# Patient Record
Sex: Male | Born: 1935
Health system: Southern US, Community
[De-identification: ages and names within clinical notes are randomized; demographics above are authoritative.]

## PROBLEM LIST (undated history)

## (undated) DIAGNOSIS — N320 Bladder-neck obstruction: Secondary | ICD-10-CM

## (undated) DIAGNOSIS — K5730 Diverticulosis of large intestine without perforation or abscess without bleeding: Secondary | ICD-10-CM

## (undated) DIAGNOSIS — N529 Male erectile dysfunction, unspecified: Secondary | ICD-10-CM

## (undated) DIAGNOSIS — E785 Hyperlipidemia, unspecified: Secondary | ICD-10-CM

## (undated) DIAGNOSIS — I2510 Atherosclerotic heart disease of native coronary artery without angina pectoris: Secondary | ICD-10-CM

## (undated) DIAGNOSIS — I714 Abdominal aortic aneurysm, without rupture, unspecified: Secondary | ICD-10-CM

## (undated) DIAGNOSIS — M5430 Sciatica, unspecified side: Secondary | ICD-10-CM

## (undated) DIAGNOSIS — N393 Stress incontinence (female) (male): Secondary | ICD-10-CM

## (undated) DIAGNOSIS — M1990 Unspecified osteoarthritis, unspecified site: Secondary | ICD-10-CM

## (undated) DIAGNOSIS — Z8719 Personal history of other diseases of the digestive system: Secondary | ICD-10-CM

## (undated) DIAGNOSIS — I10 Essential (primary) hypertension: Secondary | ICD-10-CM

## (undated) DIAGNOSIS — C679 Malignant neoplasm of bladder, unspecified: Secondary | ICD-10-CM

## (undated) DIAGNOSIS — Z923 Personal history of irradiation: Secondary | ICD-10-CM

## (undated) DIAGNOSIS — Z860100 Personal history of colon polyps, unspecified: Secondary | ICD-10-CM

## (undated) DIAGNOSIS — I739 Peripheral vascular disease, unspecified: Secondary | ICD-10-CM

## (undated) DIAGNOSIS — Z9189 Other specified personal risk factors, not elsewhere classified: Secondary | ICD-10-CM

## (undated) DIAGNOSIS — C61 Malignant neoplasm of prostate: Secondary | ICD-10-CM

## (undated) DIAGNOSIS — H8110 Benign paroxysmal vertigo, unspecified ear: Secondary | ICD-10-CM

## (undated) DIAGNOSIS — N35919 Unspecified urethral stricture, male, unspecified site: Secondary | ICD-10-CM

## (undated) DIAGNOSIS — K219 Gastro-esophageal reflux disease without esophagitis: Secondary | ICD-10-CM

## (undated) DIAGNOSIS — Z973 Presence of spectacles and contact lenses: Secondary | ICD-10-CM

## (undated) DIAGNOSIS — Z951 Presence of aortocoronary bypass graft: Secondary | ICD-10-CM

## (undated) DIAGNOSIS — Z9889 Other specified postprocedural states: Secondary | ICD-10-CM

## (undated) HISTORY — DX: Abdominal aortic aneurysm, without rupture: I71.4

## (undated) HISTORY — PX: CARDIAC CATHETERIZATION: SHX172

## (undated) HISTORY — DX: Abdominal aortic aneurysm, without rupture, unspecified: I71.40

## (undated) HISTORY — DX: Personal history of irradiation: Z92.3

## (undated) HISTORY — PX: KNEE ARTHROSCOPY: SUR90

## (undated) HISTORY — PX: CARDIOVASCULAR STRESS TEST: SHX262

## (undated) HISTORY — DX: Atherosclerotic heart disease of native coronary artery without angina pectoris: I25.10

## (undated) HISTORY — PX: LUMBAR DISC SURGERY: SHX700

---

## 1979-08-03 HISTORY — PX: LUMBAR DISC SURGERY: SHX700

## 1989-12-02 HISTORY — PX: CORONARY ANGIOPLASTY: SHX604

## 1992-12-02 HISTORY — PX: INGUINAL HERNIA REPAIR: SUR1180

## 2001-04-01 ENCOUNTER — Ambulatory Visit (HOSPITAL_COMMUNITY): Admission: RE | Admit: 2001-04-01 | Discharge: 2001-04-01 | Payer: Self-pay | Admitting: Gastroenterology

## 2002-11-08 ENCOUNTER — Ambulatory Visit (HOSPITAL_COMMUNITY): Admission: RE | Admit: 2002-11-08 | Discharge: 2002-11-08 | Payer: Self-pay | Admitting: Gastroenterology

## 2002-11-08 ENCOUNTER — Encounter (INDEPENDENT_AMBULATORY_CARE_PROVIDER_SITE_OTHER): Payer: Self-pay | Admitting: *Deleted

## 2004-12-03 DIAGNOSIS — C61 Malignant neoplasm of prostate: Secondary | ICD-10-CM | POA: Insufficient documentation

## 2005-09-26 ENCOUNTER — Ambulatory Visit: Admission: RE | Admit: 2005-09-26 | Discharge: 2005-11-26 | Payer: Self-pay | Admitting: Radiation Oncology

## 2005-11-18 ENCOUNTER — Encounter (INDEPENDENT_AMBULATORY_CARE_PROVIDER_SITE_OTHER): Payer: Self-pay | Admitting: Specialist

## 2005-11-18 ENCOUNTER — Inpatient Hospital Stay (HOSPITAL_COMMUNITY): Admission: RE | Admit: 2005-11-18 | Discharge: 2005-11-21 | Payer: Self-pay | Admitting: Urology

## 2005-11-18 HISTORY — PX: OTHER SURGICAL HISTORY: SHX169

## 2007-03-03 ENCOUNTER — Encounter: Payer: Self-pay | Admitting: Family Medicine

## 2009-10-23 ENCOUNTER — Encounter: Payer: Self-pay | Admitting: Cardiology

## 2010-04-06 ENCOUNTER — Ambulatory Visit: Payer: Self-pay | Admitting: Family Medicine

## 2010-04-06 DIAGNOSIS — L989 Disorder of the skin and subcutaneous tissue, unspecified: Secondary | ICD-10-CM | POA: Insufficient documentation

## 2010-04-06 DIAGNOSIS — K219 Gastro-esophageal reflux disease without esophagitis: Secondary | ICD-10-CM | POA: Insufficient documentation

## 2010-04-06 DIAGNOSIS — H8110 Benign paroxysmal vertigo, unspecified ear: Secondary | ICD-10-CM | POA: Insufficient documentation

## 2010-04-06 DIAGNOSIS — I10 Essential (primary) hypertension: Secondary | ICD-10-CM | POA: Insufficient documentation

## 2010-04-06 DIAGNOSIS — E785 Hyperlipidemia, unspecified: Secondary | ICD-10-CM | POA: Insufficient documentation

## 2010-04-06 DIAGNOSIS — M159 Polyosteoarthritis, unspecified: Secondary | ICD-10-CM | POA: Insufficient documentation

## 2010-04-06 DIAGNOSIS — I2510 Atherosclerotic heart disease of native coronary artery without angina pectoris: Secondary | ICD-10-CM | POA: Insufficient documentation

## 2010-04-18 ENCOUNTER — Ambulatory Visit: Payer: Self-pay | Admitting: Family Medicine

## 2010-08-16 ENCOUNTER — Ambulatory Visit: Payer: Self-pay | Admitting: Family Medicine

## 2010-10-31 ENCOUNTER — Ambulatory Visit: Payer: Self-pay | Admitting: Family Medicine

## 2010-10-31 LAB — CONVERTED CEMR LAB
ALT: 25 units/L (ref 0–53)
AST: 21 units/L (ref 0–37)
Albumin: 4.2 g/dL (ref 3.5–5.2)
Alkaline Phosphatase: 60 units/L (ref 39–117)
BUN: 18 mg/dL (ref 6–23)
Basophils Absolute: 0 10*3/uL (ref 0.0–0.1)
Basophils Relative: 0.8 % (ref 0.0–3.0)
Bilirubin Urine: NEGATIVE
Bilirubin, Direct: 0.1 mg/dL (ref 0.0–0.3)
CO2: 28 meq/L (ref 19–32)
Calcium: 9.2 mg/dL (ref 8.4–10.5)
Chloride: 106 meq/L (ref 96–112)
Cholesterol: 155 mg/dL (ref 0–200)
Creatinine, Ser: 1.5 mg/dL (ref 0.4–1.5)
Eosinophils Absolute: 0.4 10*3/uL (ref 0.0–0.7)
Eosinophils Relative: 7.8 % — ABNORMAL HIGH (ref 0.0–5.0)
GFR calc non Af Amer: 47.69 mL/min (ref >60)
Glucose, Bld: 104 mg/dL — ABNORMAL HIGH (ref 70–99)
HCT: 42.5 % (ref 39.0–52.0)
HDL: 38.6 mg/dL — ABNORMAL LOW (ref >39.00)
Hemoglobin: 14.8 g/dL (ref 13.0–17.0)
Ketones, ur: NEGATIVE mg/dL
LDL Cholesterol: 90 mg/dL (ref 0–99)
Leukocytes, UA: NEGATIVE
Lymphocytes Relative: 32.1 % (ref 12.0–46.0)
Lymphs Abs: 1.7 10*3/uL (ref 0.7–4.0)
MCHC: 34.8 g/dL (ref 30.0–36.0)
MCV: 102.5 fL — ABNORMAL HIGH (ref 78.0–100.0)
Monocytes Absolute: 0.6 10*3/uL (ref 0.1–1.0)
Monocytes Relative: 11.7 % (ref 3.0–12.0)
Neutro Abs: 2.6 10*3/uL (ref 1.4–7.7)
Neutrophils Relative %: 47.6 % (ref 43.0–77.0)
Nitrite: NEGATIVE
PSA: 0.07 ng/mL — ABNORMAL LOW (ref 0.10–4.00)
Platelets: 156 10*3/uL (ref 150.0–400.0)
Potassium: 4.8 meq/L (ref 3.5–5.1)
RBC: 4.14 M/uL — ABNORMAL LOW (ref 4.22–5.81)
RDW: 12.9 % (ref 11.5–14.6)
Sodium: 141 meq/L (ref 135–145)
Specific Gravity, Urine: 1.02 (ref 1.000–1.030)
TSH: 1.76 microintl units/mL (ref 0.35–5.50)
Total Bilirubin: 0.6 mg/dL (ref 0.3–1.2)
Total CHOL/HDL Ratio: 4
Total Protein, Urine: NEGATIVE mg/dL
Total Protein: 6.9 g/dL (ref 6.0–8.3)
Triglycerides: 130 mg/dL (ref 0.0–149.0)
Urine Glucose: NEGATIVE mg/dL
Urobilinogen, UA: 0.2 (ref 0.0–1.0)
VLDL: 26 mg/dL (ref 0.0–40.0)
WBC: 5.4 10*3/uL (ref 4.5–10.5)
pH: 5.5 (ref 5.0–8.0)

## 2010-11-08 ENCOUNTER — Ambulatory Visit: Payer: Self-pay | Admitting: Family Medicine

## 2010-11-08 LAB — CONVERTED CEMR LAB
Cholesterol, target level: 200 mg/dL
HDL goal, serum: 40 mg/dL
LDL Goal: 100 mg/dL

## 2010-11-23 ENCOUNTER — Ambulatory Visit: Payer: Self-pay | Admitting: Family Medicine

## 2010-11-23 LAB — CONVERTED CEMR LAB: Rapid Strep: NEGATIVE

## 2010-11-27 ENCOUNTER — Ambulatory Visit
Admission: RE | Admit: 2010-11-27 | Discharge: 2010-11-27 | Payer: Self-pay | Source: Home / Self Care | Attending: Family Medicine | Admitting: Family Medicine

## 2010-11-27 DIAGNOSIS — J019 Acute sinusitis, unspecified: Secondary | ICD-10-CM | POA: Insufficient documentation

## 2010-12-04 ENCOUNTER — Telehealth: Payer: Self-pay | Admitting: Internal Medicine

## 2011-01-01 NOTE — Assessment & Plan Note (Signed)
Summary: skin lesion excision/njr   Vital Signs:  Patient profile:   75 year old male Temp:     97.8 degrees F oral BP sitting:   124 / 72  (left arm) Cuff size:   regular  Vitals Entered By: Sid Falcon LPN (Apr 18, 2010 10:29 AM)   History of Present Illness: Here for skin lesion excision R occiput.  Noted incidentally  on exam. No assoc pain or pruritis.  No personal hx of skin cancer.  Allergies: No Known Drug Allergies  Past History:  Past Medical History: Last updated: 04/06/2010 Arthritis Prostate cancer 2006 Hyperlipidemia Hypertension CAD angioplasty 1991   Cardiology Dr Deborah Chalk GERD PMH reviewed for relevance  Physical Exam  General:  Well-developed,well-nourished,in no acute distress; alert,appropriate and cooperative throughout examination Head:  R occiput 6mm skin lesion-nodular, fleshy color with well demarcated border with some telangiectasias on surface.  No ulceration.   Impression & Recommendations:  Problem # 1:  SKIN LESION (ICD-709.9) Rule out basal cell can..  After reviewing risks and benefits of shave excision, pt wished to proceed.   anesth with 1% xyloc with epi and shave with #15 blade. Minimal bleeding and used drysol.  antibiotic applied.  Specimen to [facility]. Orders: Shave Skin Lesion 0.6-1.0cm scalp/neck/hands/feet/genitalia (16109)  Complete Medication List: 1)  Benicar 40 Mg Tabs (Olmesartan medoxomil) .... Once daily 2)  Lipitor 20 Mg Tabs (Atorvastatin calcium) .... Once daily 3)  Pantoprazole Sodium 40 Mg Tbec (Pantoprazole sodium) .... One tab every other day 4)  Aspirin 81 Mg Tabs (Aspirin) .... Once daily 5)  Stool Softener 100 Mg Caps (Docusate sodium) .... 2 tabs daily 6)  Glucosamine Sulfate 1000 Mg Caps (Glucosamine sulfate) .... As needed  Other Orders: Prescription Created Electronically [phone])  Patient Instructions: 1)  Keep wound dry for 24 hours then clean with soap and water. 2)  Continue topical  antibiotic daily for 3-4 days. 3)  Follow up promptly if you notice any redness or drainage from wound site. Prescriptions: LIPITOR 20 MG TABS (ATORVASTATIN CALCIUM) once daily  #90 x 3   Entered and Authorized by:   Evelena Peat MD   Signed by:   Evelena Peat MD on 04/18/2010   Method used:   Electronically to        [facility]* (mail-order)             ,          Ph: [phone]       Fax: [phone]   RxID:   [id number]

## 2011-01-01 NOTE — Assessment & Plan Note (Signed)
Summary: flu shot//ccm  Nurse Visit   Review of Systems       Flu Vaccine Consent Questions     Do you have a history of severe allergic reactions to this vaccine? no    Any prior history of allergic reactions to egg and/or gelatin? no    Do you have a sensitivity to the preservative Thimersol? no    Do you have a past history of Guillan-Barre Syndrome? no    Do you currently have an acute febrile illness? no    Have you ever had a severe reaction to latex? no    Vaccine information given and explained to patient? yes    Are you currently pregnant? no    Lot Number:[id number]   Exp Date:06/01/2011   Site Given  Left Deltoid IM Josph Macho RMA  August 16, 2010 3:23 PM    Allergies: No Known Drug Allergies  Orders Added: 1)  Flu Vaccine 69yrs + MEDICARE PATIENTS [Q2039] 2)  Administration Flu vaccine - MCR [G0008]

## 2011-01-01 NOTE — Assessment & Plan Note (Signed)
Summary: NEW PT FROM [facility]   Vital Signs:  Patient profile:   75 year old male Height:      66.25 inches Weight:      191 pounds BMI:     30.71 Temp:     98.2 degrees F oral Pulse rate:   72 / minute Pulse rhythm:   regular Resp:     12 per minute BP sitting:   140 / 80  (left arm) Cuff size:   regular  Vitals Entered By: Sid Falcon LPN (Apr 06, 1609 10:43 AM)  Nutrition Counseling: Patient's BMI is greater than 25 and therefore counseled on weight management options. CC: New to establish   History of Present Illness: New patient to establish care.  Past medical history significant for osteoarthritis involving multiple joints, remote history of prostate cancer, GERD, hypertension, hyperlipidemia. He continues to see cardiologist and urologist regularly.  CAD stable. History of reported angioplasty 1991. No other interventional studies. Reported nuclear str test and lipid panel last December normal.  Arthritis mostly involving thumbs, right hip, and neck. Has had some recent progressive lower cervical neck pain. No injury. No radiculopathy symptoms. Denies any numbness or weakness. He usually does not take any medications. Pain exacerbated by neck movement.  Hypertension treated Benicar 40 mg daily. Denies any recent orthostatic symptoms. No recent chest pains or palpitations.  Lipids treated with Lipitor 20 mg daily. No myalgias.  Reflux stable on pantoprazole which he takes every other day. No recent dysphagia.  Acute problem of dizziness early morning which is vertigo by description usually goes away after several minutes. No hearing changes. No focal weakness. No symptoms of ataxia.  Patient relates a couple skin lesions on face and one right occipital area which he just noticed in recent months.   These are asymptomatic.  Family history reviewed as recorded elsewhere. Social history is that he quit smoking 1990. Usually one alcoholic beverage per day. He is  married. Retired.  Preventive Screening-Counseling & Management  Alcohol-Tobacco     Smoking Status: quit     Year Quit: 1990  Caffeine-Diet-Exercise     Does Patient Exercise: yes  Allergies (verified): No Known Drug Allergies  Past History:  Family History: Last updated: 04/06/2010 Family History of Arthritis Family History of Prostate CA, father Elevated cholesterol, parent Hypertension, parent, grandparent sudden death  Social History: Last updated: 04/06/2010 Retired Married Alcohol use-yes Regular exercise-yes past smoker, quit 1990  Risk Factors: Exercise: yes (04/06/2010)  Risk Factors: Smoking Status: quit (04/06/2010)  Past Medical History: Arthritis Prostate cancer 2006 Hyperlipidemia Hypertension CAD angioplasty 1991   Cardiology Dr Deborah Chalk GERD  Past Surgical History: Angioplasty 1991 Esophagas ?dilatation, 1992 Hernia, 1994 Endoscopy 2002 Radical Prostatectomy 2006  Family History: Family History of Arthritis Family History of Prostate CA, father Elevated cholesterol, parent Hypertension, parent, grandparent sudden death  Social History: Retired Married Alcohol use-yes Regular exercise-yes past smoker, quit 1990 Smoking Status:  quit Does Patient Exercise:  yes  Review of Systems  The patient denies anorexia, fever, weight loss, weight gain, decreased hearing, chest pain, syncope, dyspnea on exertion, peripheral edema, prolonged cough, headaches, hemoptysis, abdominal pain, melena, hematochezia, severe indigestion/heartburn, hematuria, muscle weakness, depression, and enlarged lymph nodes.         freq R hip pains.  Physical Exam  General:  Well-developed,well-nourished,in no acute distress; alert,appropriate and cooperative throughout examination Head:  Normocephalic and atraumatic without obvious abnormalities. No apparent alopecia or balding. Eyes:  pupils equal, pupils round, and pupils  reactive to light.   Ears:   External ear exam shows no significant lesions or deformities.  Otoscopic examination reveals clear canals, tympanic membranes are intact bilaterally without bulging, retraction, inflammation or discharge. Hearing is grossly normal bilaterally. Mouth:  Oral mucosa and oropharynx without lesions or exudates.  Teeth in good repair. Neck:  No deformities, masses, or tenderness noted. Lungs:  Normal respiratory effort, chest expands symmetrically. Lungs are clear to auscultation, no crackles or wheezes. Heart:  normal rate, regular rhythm, and no gallop.   Abdomen:  soft, non-tender, no distention, no masses, no hepatomegaly, and no splenomegaly.   Extremities:  no peripheral edema or clubbing Neurologic:  alert & oriented X3, cranial nerves II-XII intact, strength normal in all extremities, and gait normal.   Skin:  patient has a couple seborrheic keratoses one left side of face and one right side of face. These reveal clear margin and homogenous in color. Right occipital scalp slightly elevated nodular lesion about 5 mm diameter with slight central umbilication and some telangiectasias on the surface consistent with possible basal cell carcinoma. Psych:  normally interactive, good eye contact, not anxious appearing, and not depressed appearing.     Impression & Recommendations:  Problem # 1:  GERD (ICD-530.81)  His updated medication list for this problem includes:    Pantoprazole Sodium 40 Mg Tbec (Pantoprazole sodium) ..... One tab every other day  Problem # 2:  HYPERTENSION (ICD-401.9)  His updated medication list for this problem includes:    Benicar 40 Mg Tabs (Olmesartan medoxomil) ..... Once daily  Problem # 3:  HYPERLIPIDEMIA (ICD-272.4) monitored by cardiology. His updated medication list for this problem includes:    Lipitor 20 Mg Tabs (Atorvastatin calcium) ..... Once daily  Problem # 4:  SKIN LESION (ICD-709.9) R/O basal cell ca R Occiput.  Pt scheduled for shave  bx.  Problem # 5:  BENIGN POSITIONAL VERTIGO (ICD-386.11) Symptoms improving.  Problem # 6:  OSTEOARTHRITIS, GENERALIZED (ICD-715.00)  His updated medication list for this problem includes:    Aspirin 81 Mg Tabs (Aspirin) ..... Once daily  Problem # 7:  CAD (ICD-414.00) symptoms stable. cont close f/u with cardiology. His updated medication list for this problem includes:    Benicar 40 Mg Tabs (Olmesartan medoxomil) ..... Once daily    Aspirin 81 Mg Tabs (Aspirin) ..... Once daily  Complete Medication List: 1)  Benicar 40 Mg Tabs (Olmesartan medoxomil) .... Once daily 2)  Lipitor 20 Mg Tabs (Atorvastatin calcium) .... Once daily 3)  Pantoprazole Sodium 40 Mg Tbec (Pantoprazole sodium) .... One tab every other day 4)  Aspirin 81 Mg Tabs (Aspirin) .... Once daily 5)  Stool Softener 100 Mg Caps (Docusate sodium) .... 2 tabs daily 6)  Glucosamine Sulfate 1000 Mg Caps (Glucosamine sulfate) .... As needed  Patient Instructions: 1)  Schedule followup appointment for skin lesion excision within the next month  Preventive Care Screening  Colonoscopy:    Date:  12/02/2001    Results:  normal

## 2011-01-01 NOTE — Letter (Signed)
Summary: Records from [facility]  Records from [facility]   Imported By: Maryln Gottron 04/09/2010 10:35:58  _____________________________________________________________________  External Attachment:    Type:   Image     Comment:   External Document

## 2011-01-01 NOTE — Assessment & Plan Note (Signed)
Summary: cpx/cjr   Vital Signs:  Patient profile:   75 year old male Height:      66 inches Weight:      171 pounds Temp:     98.0 degrees F oral Pulse rate:   78 / minute BP sitting:   130 / 80  (left arm) Cuff size:   regular  Vitals Entered By: Romualdo Bolk, CMA (AAMA) (November 08, 2010 1:58 PM) CC: CPX, Hypertension Management, Lipid Management   History of Present Illness: Here for Medicare wellness exam and follow up medical problems.  Here for Medicare AWV:  1.   Risk factors based on Past M, S, F history:  Patient has history of coronary artery disease with angioplasty back in 1991. Other medical problems include history of generalized osteoarthritis, prostate cancer, GERD, hypertension, and hyperlipidemia.  ex-smoker quit 1990. 2.   Physical Activities: states fairly active. Walks some for exercise 3.   Depression/mood: no recent problems with depression or anxiety 4.   Hearing: stable with no gross deficits and no problems with whisper at 15 feet 5.   ADL's: fully independent in all activities of daily living 6.   Fall Risk: low risk. No major orthopedic impairments 7.   Home Safety: no issues identified 8.   Height, weight, &visual acuity:regular eye checkups. Past history of cataract surgery. Appetite and weight stable 9.   Counseling: prior colonoscopy 2003. Exercise more regularly. Needs Tdap 10.   Labs ordered based on risk factors: lipid, hepatic, PSA, BMP, CBC 11.           Referral Coordination  continued yearly followup with urology 12.           Care Plan  Tdap given.  Colonoscopy in 2 years.  Exercise regularly.  Flu vaccine given. 13.            Cognitive Assessment no short-term or long-term memory deficits. No cognitive impairment. Mood and affect are normal  Hx of CAD, hypertension, and hyperlipidemia.  Hx GERD and symptoms well controlled on pantoprazole.  Has been on this for several years. Compliant with medication.  No side  effects. Osteoarthritis with some pain R hip with ambulation.  Some mild early AM stiffness.  No falls. No dysphagia.   Hypertension History:      He denies headache, chest pain, palpitations, dyspnea with exertion, orthopnea, PND, peripheral edema, visual symptoms, neurologic problems, syncope, and side effects from treatment.  He notes no problems with any antihypertensive medication side effects.        Positive major cardiovascular risk factors include male age 42 years old or older, hyperlipidemia, and hypertension.  Negative major cardiovascular risk factors include no history of diabetes and non-tobacco-user status.        Positive history for target organ damage include ASHD (either angina/prior MI/prior CABG).  Further assessment for target organ damage reveals no history of cardiac end-organ damage (CHF/LVH), stroke/TIA, peripheral vascular disease, renal insufficiency, or hypertensive retinopathy.    Lipid Management History:      Positive NCEP/ATP III risk factors include male age 47 years old or older, HDL cholesterol less than 40, hypertension, and ASHD (either angina/prior MI/prior CABG).  Negative NCEP/ATP III risk factors include non-diabetic, non-tobacco-user status, no prior stroke/TIA, no peripheral vascular disease, and no history of aortic aneurysm.        He expresses no side effects from his lipid-lowering medication.  The patient denies any symptoms to suggest myopathy or liver disease.  Preventive Screening-Counseling & Management  Alcohol-Tobacco     Smoking Status: quit     Year Quit: 1990  Caffeine-Diet-Exercise     Does Patient Exercise: yes  Clinical Review Panels:  Prevention   Last Colonoscopy:  normal (12/02/2001)   Last PSA:  0.07 (10/31/2010)  Immunizations   Last Tetanus Booster:  Tdap (11/08/2010)   Last Flu Vaccine:  Fluvax 3+ (08/16/2010)   Last Pneumovax:  given (12/05/2004)  Lipid Management   Cholesterol:  155 (10/31/2010)   LDL  (bad choesterol):  90 (10/31/2010)   HDL (good cholesterol):  38.60 (10/31/2010)  Diabetes Management   Creatinine:  1.5 (10/31/2010)   Last Flu Vaccine:  Fluvax 3+ (08/16/2010)   Last Pneumovax:  given (12/05/2004)  CBC   WBC:  5.4 (10/31/2010)   RBC:  4.14 (10/31/2010)   Hgb:  14.8 (10/31/2010)   Hct:  42.5 (10/31/2010)   Platelets:  156.0 (10/31/2010)   MCV  102.5 (10/31/2010)   MCHC  34.8 (10/31/2010)   RDW  12.9 (10/31/2010)   PMN:  47.6 (10/31/2010)   Lymphs:  32.1 (10/31/2010)   Monos:  11.7 (10/31/2010)   Eosinophils:  7.8 (10/31/2010)   Basophil:  0.8 (10/31/2010)  Complete Metabolic Panel   Glucose:  104 (10/31/2010)   Sodium:  141 (10/31/2010)   Potassium:  4.8 (10/31/2010)   Chloride:  106 (10/31/2010)   CO2:  28 (10/31/2010)   BUN:  18 (10/31/2010)   Creatinine:  1.5 (10/31/2010)   Albumin:  4.2 (10/31/2010)   Total Protein:  6.9 (10/31/2010)   Calcium:  9.2 (10/31/2010)   Total Bili:  0.6 (10/31/2010)   Alk Phos:  60 (10/31/2010)   SGPT (ALT):  25 (10/31/2010)   SGOT (AST):  21 (10/31/2010)   Current Medications (verified): 1)  Benicar 40 Mg Tabs (Olmesartan Medoxomil) .... Once Daily 2)  Lipitor 20 Mg Tabs (Atorvastatin Calcium) .... Once Daily 3)  Pantoprazole Sodium 40 Mg Tbec (Pantoprazole Sodium) .... One Tab Every Other Day 4)  Aspirin 81 Mg Tabs (Aspirin) .... Once Daily 5)  Stool Softener 100 Mg Caps (Docusate Sodium) .... 2 Tabs Daily 6)  Glucosamine Sulfate 1000 Mg Caps (Glucosamine Sulfate) .... As Needed  Allergies (verified): No Known Drug Allergies  Past History:  Past Medical History: Last updated: 04/06/2010 Arthritis Prostate cancer 2006 Hyperlipidemia Hypertension CAD angioplasty 1991   Cardiology Dr Deborah Chalk GERD  Past Surgical History: Last updated: 04/06/2010 Angioplasty 1991 Esophagas ?dilatation, 1992 Hernia, 1994 Endoscopy 2002 Radical Prostatectomy 2006  Family History: Last updated: 04/06/2010 Family  History of Arthritis Family History of Prostate CA, father Elevated cholesterol, parent Hypertension, parent, grandparent sudden death  Social History: Last updated: 04/06/2010 Retired Married Alcohol use-yes Regular exercise-yes past smoker, quit 1990  Risk Factors: Exercise: yes (11/08/2010)  Risk Factors: Smoking Status: quit (11/08/2010)  Family History: Reviewed history from 04/06/2010 and no changes required. Family History of Arthritis Family History of Prostate CA, father Elevated cholesterol, parent Hypertension, parent, grandparent sudden death  Review of Systems  The patient denies anorexia, fever, weight loss, weight gain, vision loss, decreased hearing, hoarseness, chest pain, syncope, dyspnea on exertion, peripheral edema, prolonged cough, headaches, hemoptysis, abdominal pain, melena, hematochezia, severe indigestion/heartburn, hematuria, incontinence, genital sores, muscle weakness, suspicious skin lesions, transient blindness, difficulty walking, depression, unusual weight change, abnormal bleeding, enlarged lymph nodes, and testicular masses.    Physical Exam  General:  Well-developed,well-nourished,in no acute distress; alert,appropriate and cooperative throughout examination Eyes:  pupils equal, pupils round, and pupils reactive to  light.   Ears:  External ear exam shows no significant lesions or deformities.  Otoscopic examination reveals clear canals, tympanic membranes are intact bilaterally without bulging, retraction, inflammation or discharge. Hearing is grossly normal bilaterally. Mouth:  Oral mucosa and oropharynx without lesions or exudates.  Teeth in good repair. Neck:  No deformities, masses, or tenderness noted. Lungs:  Normal respiratory effort, chest expands symmetrically. Lungs are clear to auscultation, no crackles or wheezes. Heart:  Normal rate and regular rhythm. S1 and S2 normal without gallop, murmur, click, rub or other extra  sounds. Abdomen:  Bowel sounds positive,abdomen soft and non-tender without masses, organomegaly or hernias noted. Rectal:  recent per urology Msk:  No deformity or scoliosis noted of thoracic or lumbar spine.   Extremities:  No clubbing, cyanosis, edema, or deformity noted with normal full range of motion of all joints.   Neurologic:  alert & oriented X3, cranial nerves II-XII intact, strength normal in all extremities, and gait normal.   Skin:  no rashes and no suspicious lesions.   Cervical Nodes:  No lymphadenopathy noted Psych:  normally interactive, good eye contact, not anxious appearing, and not depressed appearing.     Impression & Recommendations:  Problem # 1:  Preventive Health Care (ICD-V70.0) Tdap given.  other immunizations up to date.  Problem # 2:  HYPERTENSION (ICD-401.9)  His updated medication list for this problem includes:    Benicar 40 Mg Tabs (Olmesartan medoxomil) ..... Once daily  Problem # 3:  GERD (ICD-530.81)  His updated medication list for this problem includes:    Pantoprazole Sodium 40 Mg Tbec (Pantoprazole sodium) ..... One tab every other day  Problem # 4:  OSTEOARTHRITIS, GENERALIZED (ICD-715.00)  His updated medication list for this problem includes:    Aspirin 81 Mg Tabs (Aspirin) ..... Once daily  Problem # 5:  HYPERLIPIDEMIA (ICD-272.4)  His updated medication list for this problem includes:    Lipitor 20 Mg Tabs (Atorvastatin calcium) ..... Once daily  Complete Medication List: 1)  Benicar 40 Mg Tabs (Olmesartan medoxomil) .... Once daily 2)  Lipitor 20 Mg Tabs (Atorvastatin calcium) .... Once daily 3)  Pantoprazole Sodium 40 Mg Tbec (Pantoprazole sodium) .... One tab every other day 4)  Aspirin 81 Mg Tabs (Aspirin) .... Once daily 5)  Stool Softener 100 Mg Caps (Docusate sodium) .... 2 tabs daily 6)  Glucosamine Sulfate 1000 Mg Caps (Glucosamine sulfate) .... As needed  Other Orders: Medicare -1st Annual Wellness Visit  [phone]) Tdap => 46yrs IM (60454) Admin 1st Vaccine (09811)  Hypertension Assessment/Plan:      The patient's hypertensive risk group is category C: Target organ damage and/or diabetes.  Today's blood pressure is 130/80.    Lipid Assessment/Plan:      Based on NCEP/ATP III, the patient's risk factor category is "history of coronary disease, peripheral vascular disease, cerebrovascular disease, or aortic aneurysm".  The patient's lipid goals are as follows: Total cholesterol goal is 200; LDL cholesterol goal is 100; HDL cholesterol goal is 40; Triglyceride goal is 150.    Patient Instructions: 1)  Please schedule a follow-up appointment in 1 year.  2)  It is important that you exercise reguarly at least 20 minutes 5 times a week. If you develop chest pain, have severe difficulty breathing, or feel very tired, stop exercising immediately and seek medical attention.    Orders Added: 1)  Medicare -1st Annual Wellness Visit [G0438] 2)  Est. Patient Level IV [91478] 3)  Tdap => 14yrs IM [  90715] 4)  Admin 1st Vaccine [16109]   Immunizations Administered:  Tetanus Vaccine:    Vaccine Type: Tdap    Site: right deltoid    Mfr: GlaxoSmithKline    Dose: 0.5 ml    Route: IM    Given by: Romualdo Bolk, CMA (AAMA)    Exp. Date: 09/20/2012    Lot #: [id number]   Immunizations Administered:  Tetanus Vaccine:    Vaccine Type: Tdap    Site: right deltoid    Mfr: GlaxoSmithKline    Dose: 0.5 ml    Route: IM    Given by: Romualdo Bolk, CMA (AAMA)    Exp. Date: 09/20/2012    Lot #: [id number]   Preventive Care Screening  Last Tetanus Booster:    Date:  11/08/2010    Results:  Tdap  Last Pneumovax:    Date:  12/05/2004    Results:  given

## 2011-01-03 ENCOUNTER — Ambulatory Visit (INDEPENDENT_AMBULATORY_CARE_PROVIDER_SITE_OTHER): Payer: Medicare Other | Admitting: Cardiology

## 2011-01-03 DIAGNOSIS — I2510 Atherosclerotic heart disease of native coronary artery without angina pectoris: Secondary | ICD-10-CM

## 2011-01-03 DIAGNOSIS — E7800 Pure hypercholesterolemia, unspecified: Secondary | ICD-10-CM

## 2011-01-03 NOTE — Progress Notes (Signed)
Summary: cough rx  Phone Note Call from Patient Call back at Home Phone [phone]   Caller: Patient Reason for Call: Acute Illness Summary of Call: patient is calling because his congestion is better but his  cough is not better. He is not sleeping.  He is allergic to codiene.  Any suggestions?  [facility] Initial call taken by: Kern Reap CMA Duncan Dull),  December 04, 2010 10:54 AM  Follow-up for Phone Call        generic tessalon perles 200 mg #21 one TID Follow-up by: Gordy Savers  MD,  December 04, 2010 12:28 PM  Additional Follow-up for Phone Call Additional follow up Details #1::        Phone Call Completed, Rx Called In Additional Follow-up by: Kern Reap CMA Duncan Dull),  December 04, 2010 12:51 PM

## 2011-01-03 NOTE — Assessment & Plan Note (Signed)
Summary: ?allergic reation to med/njr   Vital Signs:  Patient profile:   75 year old male Temp:     98.8 degrees F oral BP sitting:   168 / 80  (left arm) Cuff size:   regular  Vitals Entered By: Sid Falcon LPN (November 27, 2010 8:43 AM)  History of Present Illness: Here for continued sinus pressure, PND, ST, and coughing up yellow sputum which has persisted for about 10 days. No fever. He was here last week and was felt to have a viral URI. He was given Vicodin for the ST pain, and he has taken 7 or 8 of these. These caused him to feel very bad, he was disoriented, dizzy, and nauseated. he has stopped these. He is no better than he was last week.   Allergies (verified): 1)  ! Hydrocodone  Past History:  Past Medical History: Reviewed history from 04/06/2010 and no changes required. Arthritis Prostate cancer 2006 Hyperlipidemia Hypertension CAD angioplasty 1991   Cardiology Dr Deborah Chalk GERD  Review of Systems  The patient denies anorexia, fever, weight loss, weight gain, vision loss, decreased hearing, hoarseness, chest pain, syncope, dyspnea on exertion, peripheral edema, headaches, hemoptysis, abdominal pain, melena, hematochezia, severe indigestion/heartburn, hematuria, incontinence, genital sores, muscle weakness, suspicious skin lesions, transient blindness, difficulty walking, depression, unusual weight change, abnormal bleeding, enlarged lymph nodes, angioedema, breast masses, and testicular masses.    Physical Exam  General:  Well-developed,well-nourished,in no acute distress; alert,appropriate and cooperative throughout examination Head:  Normocephalic and atraumatic without obvious abnormalities. No apparent alopecia or balding. Eyes:  No corneal or conjunctival inflammation noted. EOMI. Perrla. Funduscopic exam benign, without hemorrhages, exudates or papilledema. Vision grossly normal. Ears:  External ear exam shows no significant lesions or deformities.   Otoscopic examination reveals clear canals, tympanic membranes are intact bilaterally without bulging, retraction, inflammation or discharge. Hearing is grossly normal bilaterally. Nose:  External nasal examination shows no deformity or inflammation. Nasal mucosa are pink and moist without lesions or exudates. Mouth:  Oral mucosa and oropharynx without lesions or exudates.  Teeth in good repair. Neck:  No deformities, masses, or tenderness noted. Lungs:  Normal respiratory effort, chest expands symmetrically. Lungs are clear to auscultation, no crackles or wheezes.   Impression & Recommendations:  Problem # 1:  ACUTE SINUSITIS, UNSPECIFIED (ICD-461.9)  His updated medication list for this problem includes:    Zithromax Z-pak 250 Mg Tabs (Azithromycin) .[name]... As directed  Orders: Depo- Medrol 40mg  (J1030) Admin of Therapeutic Inj  intramuscular or subcutaneous (16109)  Complete Medication List: 1)  Benicar 40 Mg Tabs (Olmesartan medoxomil) .... Once daily 2)  Lipitor 20 Mg Tabs (Atorvastatin calcium) .... Once daily 3)  Pantoprazole Sodium 40 Mg Tbec (Pantoprazole sodium) .... One tab every other day 4)  Aspirin 81 Mg Tabs (Aspirin) .... Once daily 5)  Stool Softener 100 Mg Caps (Docusate sodium) .... 2 tabs daily 6)  Glucosamine Sulfate 1000 Mg Caps (Glucosamine sulfate) .... As needed 7)  Hydrocodone-acetaminophen 5-325 Mg Tabs (Hydrocodone-acetaminophen) .[name].. 1-2 by mouth q 6 hours as needed severe sore throat 8)  Zithromax Z-pak 250 Mg Tabs (Azithromycin) .... As directed  Patient Instructions: 1)  Please schedule a follow-up appointment as needed .  Prescriptions: ZITHROMAX Z-PAK 250 MG TABS (AZITHROMYCIN) as directed  #1 x 0   Entered and Authorized by:   Nelwyn Salisbury MD   Signed by:   Nelwyn Salisbury MD on 11/27/2010   Method used:   Electronically to  [facility] [address]* (retail)       [address]       [place], [place]  [address]       Ph: [phone]       Fax:  [phone]   RxID:   [id number]    Medication Administration  Injection # 1:    Medication: Depo- Medrol 40mg     Diagnosis: ACUTE SINUSITIS, UNSPECIFIED (ICD-461.9)    Route: IM    Site: RUOQ gluteus    Exp Date: 06/01/2013    Lot #: [id number]    Mfr: [employer]    Comments: 120 ml ordered and given    Patient tolerated injection without complications    Given by: Sid Falcon LPN (November 27, 2010 10:04 AM)  Orders Added: 1)  Est. Patient Level IV [15176] 2)  Depo- Medrol 40mg  [J1030] 3)  Admin of Therapeutic Inj  intramuscular or subcutaneous [16073]

## 2011-01-03 NOTE — Assessment & Plan Note (Signed)
Summary: ST // RS   Vital Signs:  Patient profile:   75 year old male Weight:      172 pounds O2 Sat:      98 % Temp:     97.9 degrees F Pulse rate:   92 / minute BP sitting:   130 / 78  Vitals Entered By: Pura Spice, RN (November 23, 2010 10:44 AM) CC: sore throat since monday   History of Present Illness: onset Monday of body aches, sore throat, cough, and nasal congestion. Took Robitussin, cough drops, Advil with mild relief. Sore throat symptoms were severe.  No ill contacts. Pt concerned about possible strep throat.  Allergies: No Known Drug Allergies  Past History:  Past Medical History: Last updated: 04/06/2010 Arthritis Prostate cancer 2006 Hyperlipidemia Hypertension CAD angioplasty 1991   Cardiology Dr Deborah Chalk GERD PMH reviewed for relevance  Review of Systems  The patient denies hoarseness, chest pain, syncope, dyspnea on exertion, peripheral edema, prolonged cough, hemoptysis, and abdominal pain.    Physical Exam  General:  Well-developed,well-nourished,in no acute distress; alert,appropriate and cooperative throughout examination Ears:  External ear exam shows no significant lesions or deformities.  Otoscopic examination reveals clear canals, tympanic membranes are intact bilaterally without bulging, retraction, inflammation or discharge. Hearing is grossly normal bilaterally. Nose:  External nasal examination shows no deformity or inflammation. Nasal mucosa are pink and moist without lesions or exudates. Mouth:  mild erythema Neck:  No deformities, masses, or tenderness noted. Lungs:  Normal respiratory effort, chest expands symmetrically. Lungs are clear to auscultation, no crackles or wheezes. Heart:  normal rate and regular rhythm.   Abdomen:  soft and non-tender.   Skin:  no rash.   Impression & Recommendations:  Problem # 1:  SORE THROAT (ICD-462) strep neg. Suspect viral.  Use Vicodin as needed severe sore throat. His updated  medication list for this problem includes:    Aspirin 81 Mg Tabs (Aspirin) ..... Once daily  Orders: Rapid Strep (16109)  Complete Medication List: 1)  Benicar 40 Mg Tabs (Olmesartan medoxomil) .... Once daily 2)  Lipitor 20 Mg Tabs (Atorvastatin calcium) .... Once daily 3)  Pantoprazole Sodium 40 Mg Tbec (Pantoprazole sodium) .... One tab every other day 4)  Aspirin 81 Mg Tabs (Aspirin) .... Once daily 5)  Stool Softener 100 Mg Caps (Docusate sodium) .... 2 tabs daily 6)  Glucosamine Sulfate 1000 Mg Caps (Glucosamine sulfate) .... As needed 7)  Hydrocodone-acetaminophen 5-325 Mg Tabs (Hydrocodone-acetaminophen) .[name].. 1-2 by mouth q 6 hours as needed severe sore throat Prescriptions: HYDROCODONE-ACETAMINOPHEN 5-325 MG TABS (HYDROCODONE-ACETAMINOPHEN) 1-2 by mouth q 6 hours as needed severe sore throat  #20 x 0   Entered and Authorized by:   Evelena Peat MD   Signed by:   Evelena Peat MD on 11/23/2010   Method used:   Print then Give to Patient   RxID:   [id number]    Orders Added: 1)  Est. Patient Level III [95621] 2)  Rapid Strep [30865]    Laboratory Results  Date/Time Received: November 23, 2010 11:22 AM  Date/Time Reported: 11:22 AM   Other Tests  Rapid Strep: negative Comments: Pura Spice, RN  November 23, 2010 11:25 AM

## 2011-04-19 NOTE — Discharge Summary (Signed)
[name], [name]              ACCOUNT NO.:  [id number]   MEDICAL RECORD NO.:  [id number]          PATIENT TYPE:  INP   LOCATION:  1417                         FACILITY:  [facility]   PHYSICIAN:  Maretta Bees. Vonita Moss, M.D.DATE OF BIRTH:  1936-06-19   DATE OF ADMISSION:  11/18/2005  DATE OF DISCHARGE:  11/21/2005                                 DISCHARGE SUMMARY   FINAL DIAGNOSES:  1.  Prostate carcinoma.  2.  Hypertension.  3.  History of coronary artery disease.  4.  History of gout.  5.  Hypercholesterolemia.  6.  Dyspepsia.   PROCEDURES:  Radical retropubic prostatectomy and bilateral pelvic lymph  node dissection November 18, 2005.   HISTORY:  This 75 year old gentleman was found to have a PSA  jump up to  7.2, and biopsy showed high-grade PIN on the right, but 70% of the biopsy  tissue on the left was Gleason 7 carcinoma.  He was counseled about various  therapeutic options.  He opted for radical retropubic prostatectomy.  He was  cleared preoperatively for the surgery by Dr. Deborah Chalk.  Physical examination  as noncontributory.   HOSPITAL COURSE:  After admission, he underwent radical retropubic  prostatectomy and bilateral pelvic lymph node dissection.  Final pathology  is pending at this point.  Except for a little mild ileus for a couple of  days, postop course was benign.  He remained afebrile.  He was passing  flatus and ambulating well by November 21, 2005, and his drain was removed  having had 10 mL of drainage on the last shift before drain removal.  His  urine was clear, and he will go home using a leg bag in the day and drains  bag at night.  He will have limited activity.  He may shower.  Resume diet  as tolerated.   DISCHARGE MEDICATIONS:  Include Benicar, Lipitor, Nystatin, triamcinolone  cream to the anal area, Protonix, iron supplementation, and Vicodin for  pain.   He will return to the office in one week in followup.  He was sent home in  good  condition.      Maretta Bees. Vonita Moss, M.D.  Electronically Signed     LJP/MEDQ  D:  11/21/2005  T:  11/24/2005  Job:  [id number]   cc:   [name] Can. Deborah Chalk, M.D.  Fax: [phone]

## 2011-04-19 NOTE — Procedures (Signed)
[facility]  Patient:    [name], [name]                     MRN: [id number] Proc. Date: 04/01/01 Adm. Date:  [id number] Attending:  Rich Brave CC:         Teena Irani. Arlyce Dice, M.D.   Procedure Report  PROCEDURE PERFORMED:  Upper endoscopy.  ENDOSCOPIST:  Florencia Reasons, M.D.  INDICATIONS FOR PROCEDURE:  Recurrent dysphagia symptoms and longstanding reflux symptoms in a 75 year old gentleman, many years status post his most recent endoscopic evaluation.  The dysphagia has resolved on PPI therapy.  The reflux symptoms are well controlled.  FINDINGS:  Small hiatal hernia with prominent esophageal ring, otherwise normal exam.  DESCRIPTION OF PROCEDURE:  The nature, purpose and risks of the procedure were familiar to the patient, who provided written consent.  Sedation was fentanyl 50 mcg and Versed 5 mg IV without arrhythmias or desaturation.  The Olympus video endoscope was passed under direct vision without difficulty.  The larynx looked normal.  The esophagus was easily entered and had normal mucosa throughout its entire length without evidence of free gastroesophageal reflux or any reflux esophagitis, Barretts esophagus, varices, infection or neoplasia.  There was a well-defined esophageal mucosal ring at the squamocolumnar junction although it was fairly widely patent and offered no resistance to passage of the endoscope.  Below this was a 3 cm hiatal hernia.  The stomach contained no significant residual and had normal mucosa without evidence of gastritis, erosions, ulcers, polyps or masses and a retroflex view of the proximal stomach was unremarkable.  Specifically, no significant hiatal hernia was identified on retroflex viewing.  The pylorus, duodenal bulb and second duodenum looked normal.  The scope was removed from the patient.  No biopsies were obtained.  He tolerated the procedure well and there were no apparent  complications.  IMPRESSION: 1. No esophagitis or Barretts esophagus. 2. Prominent esophageal ring, probably accounting for his history of    dysphagia. 3. Small hiatal hernia.  PLAN:  Continue PPI therapy.  Consider endoscopic dilatation if dysphagia symptoms become problematic in the future despite continued PPI therapy. DD:  04/01/01 TD:  04/01/01 Job: [id number]

## 2011-04-19 NOTE — Op Note (Signed)
   NAME:  [name], [name]                        ACCOUNT NO.:  [id number]   MEDICAL RECORD NO.:  [id number]                   PATIENT TYPE:  AMB   LOCATION:  ENDO                                 FACILITY:  [facility]   PHYSICIAN:  Bernette Redbird, M.D.                DATE OF BIRTH:  [birth year]   DATE OF PROCEDURE:  11/08/2002  DATE OF DISCHARGE:                                 OPERATIVE REPORT   PROCEDURE:  Colonoscopy with biopsy.   ENDOSCOPIST:  Bernette Redbird, M.D.   INDICATIONS FOR PROCEDURE:  This 74 year old for colon cancer screening.  He  has never previously had a colonoscopy.   FINDINGS:  Diminutive polyp in the left colon.  Sigmoid diverticulosis.   DESCRIPTION OF PROCEDURE:  The nature, purpose, and risks of the procedure  had been discussed with the patient, who provided written consent.  Sedation  was fentanyl 50 mcg and Versed 5 mg IV, without arrhythmias or desaturation.  A digital examination of the prostate was normal.  The Olympus adult video  colonoscope was advanced to the cecum without significant difficulty and  pullback was then performed.  The quality of the prep was very good, and it  was felt that all areas were well seen.  The cecum was identified by  visualization of the appendiceal orifice.  There was a tiny hyperplastic-appearing 2.0 to 3.0 mm sessile polyp removed  by a couple of col biopsies from the left colon at about 35.0 cm.  The  sigmoid region had a small to moderate amount of diverticulosis and  associated muscular thickening.  Retroflexion in the rectum was  unremarkable.  No large polyps, cancer, colitis or vascular malformations  were seen.  The patient tolerated the procedure well, and there were no apparent  complications.   IMPRESSION:  1. Solitary diminutive polyp.  2. Sigmoid diverticulosis.   PLAN:  Await pathology on the polyp with a colonoscopic followup in five  years anticipated, particularly if the polyp is adenomatous in  character.                                                Bernette Redbird, M.D.   RB/MEDQ  D:  11/08/2002  T:  11/08/2002  Job:  [id number]   cc:   Teena Irani. Arlyce Dice, M.D.  [address]  [place]  [place] [address]  Fax: [phone]

## 2011-04-19 NOTE — Op Note (Signed)
NAMESAMBA, CUMBA              ACCOUNT NO.:  [id number]   MEDICAL RECORD NO.:  [id number]          PATIENT TYPE:  INP   LOCATION:  X007                         FACILITY:  [facility]   PHYSICIAN:  Maretta Bees. Vonita Moss, M.D.DATE OF BIRTH:  12-10-1935   DATE OF PROCEDURE:  11/18/2005  DATE OF DISCHARGE:                                 OPERATIVE REPORT   PREOPERATIVE DIAGNOSIS:  Carcinoma of the prostate.   POSTOPERATIVE DIAGNOSIS:  Carcinoma of the prostate.   PROCEDURE:  Radical retropubic prostatectomy and bilateral pelvic lymph node  dissection.   SURGEON:  Dr. Larey Dresser.   ASSISTANT:  Dr. Heloise Purpura.   INDICATIONS:  This 75 year old gentleman with a family history of prostate  cancer had a PSA that went up to 7 and he underwent a biopsy that showed  high-grade PIN on the right and 70% of the tissue on the left was Gleason 7  (3+ 4) carcinoma of the prostate. He was counseled about various therapies  including surgery, radiation, etc. and he opted for open radical  prostatectomy. He was advised about the risk of incontinence, impotence,  rectal injury or hemorrhage. He opted to donate 2 units of his own blood. He  had a preoperative stress Cardiolite exam that was normal.   DESCRIPTION OF PROCEDURE:  The patient was brought to the operating room,  placed in supine position, the lower abdomen and external genitalia prepped  and draped in the usual fashion. A 24-French 30 mL Foley was inserted, a  lower midline incision was made from the symphysis pubis up toward the  umbilicus. The rectus fascia and muscle divided in the midline. The  peritoneal retroperitoneal spaces were dissected out bilaterally. The  external iliac artery and vein were identified on the right side and  dissection carried down to dissect out the obturator lymph node packet on  that side with hemostasis and lymphostasis with Hem-o-Lok clips. The packet  was removed intact with no injury in major vessels  or the obturator nerve.  In like fashion, the left obturator lymph node packet was sent. It was taken  out and sent for permanent section. The endopelvic fascia was taken down  bilaterally and he had a very small prostate fairly adherent to the  surrounding tissues. There seemed to be an element of possible inflammation  from his previous biopsies which may be accounted for by the small size of  his prostate. The neurovascular bundles were identified and were pushed away  from their normal position. The dorsal vein complex was dissected out and  McDougal clamp placed around it and the bundles ligated with #0 Vicryl and  then a suture ligature of 2-0 Vicryl was placed. There was another small  component of the posterior repair of the dorsal vein complex that then also  was secured with Vicryl ties and Vicryl suture ligature. Previously a  chromic catgut had been placed to prevent backbleeding on the anterior side  of the prostate. The urethra was identified and prostate retractor with the  Stamey retractor. Initial incision was a little too close. It was on top of  the very distal prostate and removed further distally to divide the urethra  just beyond the apex of the prostate leaving a nice urethral stump. The  prostate was then dissected off at its posterior attachments and snipping  away some rectourethralis fibers and the prostate was successfully mobilized  without injury to the rectum. The prostatic pedicles were then taken down  and divided with Hem-o-Lok clamps and the posterior aspect of the seminal  vesicles were dissected out. The anterior bladder neck was then opened and  the ureteral orifices were seen excreting into the indigo blue stained urine  well away from the bladder neck. The posterior bladder neck was divided and  the bladder base dissected free from the adjacent prostate. The ampule of  each vas were then divided and secured with a Hem-o-Lok clip and both  seminal  vesicles were taken down to their tips and hemostasis obtained with  the use of Hem-o-Lok clips in that area. At this point, the prostate and  seminal vesicles were removed intact and it looked like a good specimen. The  bladder neck mucosa was reapproximated to the adjacent bladder neck  musculature with interrupted 4-0 chromic catgut. The posterior bladder neck  was closed with the running 2-0 chromic catgut and the ureteral orifices  were seen to continue excreting blue stained urine. A few persistent pesky  bleeders were then controlled in the pelvic floor. He was given 2 units of  his own blood back as the blood loss seemed to be just somewhat continuous  from old tiny bleeders eventually controlled with bladder neck closure. The  bladder neck was then closed over a 22-French 5 mL Foley with 15 mL  eventually put in the balloon by using 2-0 Vicryl sutures with UR-5 needles  at 2, 5, 7, 10 and 12 o'clock. Finally when these sutures were tied down, we  did have good hemostasis in the pelvis. A stab wound to the left of the  wound was utilized to place a large Blake drain to drain the prevesical  space. The drain was sutured in place with a black silk and connected to the  bulb suction device. Before closure, the bladder was irrigated and there was  no extravasation and clear blue stained urine returned. The Foley catheter  was placed on traction. The rectus muscle and fascia was closed with running  #1 PDS in the wound, subcu irrigated and the skin closed with skin staples.  The wound was dressed with dry sterile gauze dressing. He was taken to the  recovery room in good condition with a correct sponge, needle and instrument  count. He tolerated the procedure well. His vital signs remained stable and  he was, however, given 2 units of autologous blood.      Maretta Bees. Vonita Moss, M.D.  Electronically Signed     LJP/MEDQ  D:  11/18/2005  T:  11/19/2005  Job:  [id number]  cc:   [name] Can.  Deborah Chalk, M.D.  Fax: [phone]   Evelena Peat, M.D.  Fax: [phone]

## 2011-04-19 NOTE — H&P (Signed)
[name], PAYER              ACCOUNT NO.:  [id number]   MEDICAL RECORD NO.:  [id number]          PATIENT TYPE:  INP   LOCATION:  NA                           FACILITY:  [facility]   PHYSICIAN:  Maretta Bees. Vonita Moss, M.D.DATE OF BIRTH:  1936/10/22   DATE OF ADMISSION:  DATE OF DISCHARGE:                                HISTORY & PHYSICAL   HISTORY:  This 75 year old gentleman was seen by me in September because his  PSA had gone from levels of 2.9, 3.0 and 3.7 up to 7.2.  Even after some  Septra, PSA was still elevated to 6.  There was a family history of prostate  cancer in his younger brother.  He had a remote history of prostatitis.  Prostate ultrasound and biopsy was performed and this final pathology showed  high-grade PIN on the right but 70% of the tissue on the left was Gleason's  7 (3+4).  He and his wife were counseled about various therapies including  observation, radiation and cryo therapy but they opted for radical  prostatectomy and they chose open versus robotic.  He was advised about  wearing a catheter and risks of impotence, incontinence, rectal injury,  hemorrhage.   He was cleared preoperatively by Dr. Deborah Chalk and that was done by means of a  normal stress Cardiolite exam.   PAST MEDICAL HISTORY:  1.  Hypertension.  2.  Gout.  3.  Hypercholesterolemia.  4.  Dyspepsia.   MEDICATIONS:  1.  Benicar 20 mg daily.  2.  Lipitor 20 mg daily.  3.  Nystatin.  4.  Triamcinolone cream to the anal area p.r.n.  5.  Protonix 40 mg daily.  6.  Iron supplementation.   HABITS:  Illicit drugs are denied.  He does smoke and does drink some  alcohol socially.   PAST SURGICAL HISTORY:  1.  He has had previous back surgery.  2.  Angioplasty.  3.  Knee surgery.  4.  Esophageal dilation.  5.  Hernia repair.   FAMILY HISTORY:  Noted above and significant for prostatic carcinoma.   REVIEW OF SYSTEMS:  Noted on health history form and was initialed by me  today.   PHYSICAL  EXAMINATION:  VITAL SIGNS: Blood pressure 126/80, pulse 72,  temperature 97.6.  GENERAL:  Alert and oriented, in no acute distress.  Appears his stated age.  SKIN:  Warm and dry.  NECK:  Supple.  CHEST:  Clear.  HEART:  Heart tones regular.  ABDOMEN:  No CVA or abdominal tenderness.  No inguinal nodes.  GU:  Penis, urethra, meatus, scrotum, testicles, epididymis unremarkable.  Prostate exam was deferred but his exam in September showed right side  greater than left with a small nodule on the left side.   IMPRESSION:  1.  Prostatic carcinoma.  2.  Family history of prostatic carcinoma.  3.  History of coronary artery disease.  4.  Hypertension.  5.  History of gout.  6.  Hypercholesterolemia.  7.  Dyspepsia.   PLAN:  Radical retropubic prostatectomy and bilateral pelvic lymph node  dissection.      Sharon Seller  Sheran Luz, M.D.  Electronically Signed     LJP/MEDQ  D:  11/18/2005  T:  11/19/2005  Job:  [id number]   cc:   Evelena Peat, M.D.  Fax: [phone]   [name] Can. Deborah Chalk, M.D.  Fax: [phone]

## 2011-11-29 ENCOUNTER — Encounter: Payer: Self-pay | Admitting: *Deleted

## 2011-11-29 ENCOUNTER — Encounter: Payer: Self-pay | Admitting: Family Medicine

## 2011-12-04 ENCOUNTER — Ambulatory Visit (INDEPENDENT_AMBULATORY_CARE_PROVIDER_SITE_OTHER): Payer: Medicare Other | Admitting: Family Medicine

## 2011-12-04 ENCOUNTER — Encounter: Payer: Self-pay | Admitting: Family Medicine

## 2011-12-04 DIAGNOSIS — C61 Malignant neoplasm of prostate: Secondary | ICD-10-CM

## 2011-12-04 DIAGNOSIS — Z0000 Encounter for general adult medical examination without abnormal findings: Secondary | ICD-10-CM | POA: Diagnosis not present

## 2011-12-04 DIAGNOSIS — I10 Essential (primary) hypertension: Secondary | ICD-10-CM

## 2011-12-04 DIAGNOSIS — E785 Hyperlipidemia, unspecified: Secondary | ICD-10-CM | POA: Diagnosis not present

## 2011-12-04 LAB — LIPID PANEL
Cholesterol: 161 mg/dL (ref 0–200)
HDL: 43 mg/dL (ref >39.00)
LDL Cholesterol: 91 mg/dL (ref 0–99)
Total CHOL/HDL Ratio: 4
Triglycerides: 133 mg/dL (ref 0.0–149.0)
VLDL: 26.6 mg/dL (ref 0.0–40.0)

## 2011-12-04 LAB — HEPATIC FUNCTION PANEL
ALT: 34 U/L (ref 0–53)
AST: 21 U/L (ref 0–37)
Albumin: 4.3 g/dL (ref 3.5–5.2)
Alkaline Phosphatase: 52 U/L (ref 39–117)
Bilirubin, Direct: 0.1 mg/dL (ref 0.0–0.3)
Total Bilirubin: 0.9 mg/dL (ref 0.3–1.2)
Total Protein: 7.1 g/dL (ref 6.0–8.3)

## 2011-12-04 LAB — BASIC METABOLIC PANEL
BUN: 24 mg/dL — ABNORMAL HIGH (ref 6–23)
CO2: 27 mEq/L (ref 19–32)
Calcium: 9.2 mg/dL (ref 8.4–10.5)
Chloride: 107 mEq/L (ref 96–112)
Creatinine, Ser: 1.4 mg/dL (ref 0.4–1.5)
GFR: 51.4 mL/min — ABNORMAL LOW (ref >60.00)
Glucose, Bld: 95 mg/dL (ref 70–99)
Potassium: 4.9 mEq/L (ref 3.5–5.1)
Sodium: 140 mEq/L (ref 135–145)

## 2011-12-04 NOTE — Progress Notes (Signed)
Subjective:    Patient ID: [name], male    DOB: 05/09/1936, 76 y.o.   MRN: [id number]  HPI  Patient here for complete physical and to followup medical problems. History of CAD, hyperlipidemia, hypertension, and GERD. Medications reviewed. Chronic problem stable. Compliant with all medications. Also takes baby aspirin one daily. no recent chest pains. History of prostate cancer. Continues to see urologist yearly and they have been following his PSA.  Pneumovax up-to-date. Influenza and tetanus up-to-date. Prior shingles vaccine. Colonoscopy about 10 years ago.  1.  Risk factors based on Past Medical , Social, and Family history viewed as below 2.  Limitations in physical activities no limitation of activities 3.  Depression/mood no depression or anxiety issues 4.  Hearing fully intact 5.  ADLs independent in all 6.  Cognitive function (orientation to time and place, language, writing, speech,memory) no short or long-term memory deficits. Language intact. Judgment intact 7.  Home Safety no concerning issues 8.  Height, weight, and visual acuity. No recent changes 9.  Counseling establish more consistent exercise 10. Recommendation of preventive services. Consider repeat colonoscopy. Patient uncertain at this time. 11. Labs based on risk factors lipid panel, hepatic panel, and basic metabolic panel 12. Care Plan as above  Past Medical History  Diagnosis Date  . HYPERLIPIDEMIA 04/06/2010  . BENIGN POSITIONAL VERTIGO 04/06/2010  . HYPERTENSION 04/06/2010  . CAD 04/06/2010  . GERD 04/06/2010  . SKIN LESION 04/06/2010  . OSTEOARTHRITIS, GENERALIZED 04/06/2010  . Acute sinusitis, unspecified 11/27/2010  . Coronary artery disease 2006    angioplasty   Past Surgical History  Procedure Date  . Angioplasty 1991  . Esophagas dilatation 1992  . Hernia repair 1994  . Prostate surgery 2006    reports that he quit smoking about 17 years ago. His smoking use included Cigarettes. He has a 20  pack-year smoking history. He does not have any smokeless tobacco history on file. His alcohol and drug histories not on file. family history includes Arthritis in his other; Cancer in his father; Hyperlipidemia in his other; and Hypertension in his other. Allergies  Allergen Reactions  . Hydrocodone     REACTION: disorientation       Review of Systems  Constitutional: Negative for fever, activity change, appetite change and fatigue.  HENT: Negative for ear pain, congestion and trouble swallowing.   Eyes: Negative for pain and visual disturbance.  Respiratory: Negative for cough, shortness of breath and wheezing.   Cardiovascular: Negative for chest pain and palpitations.  Gastrointestinal: Negative for nausea, vomiting, abdominal pain, diarrhea, constipation, blood in stool, abdominal distention and rectal pain.  Genitourinary: Negative for dysuria, hematuria and testicular pain.  Musculoskeletal: Negative for joint swelling and arthralgias.  Skin: Negative for rash.  Neurological: Negative for dizziness, syncope and headaches.  Hematological: Negative for adenopathy.  Psychiatric/Behavioral: Negative for confusion and dysphoric mood.       Objective:   Physical Exam  Constitutional: He is oriented to person, place, and time. He appears well-developed and well-nourished. No distress.  HENT:  Head: Normocephalic and atraumatic.  Right Ear: External ear normal.  Left Ear: External ear normal.  Mouth/Throat: Oropharynx is clear and moist.  Eyes: Conjunctivae and EOM are normal. Pupils are equal, round, and reactive to light.  Neck: Normal range of motion. Neck supple. No thyromegaly present.  Cardiovascular: Normal rate, regular rhythm and normal heart sounds.   No murmur heard. Pulmonary/Chest: No respiratory distress. He has no wheezes. He has no rales.  Abdominal: Soft. Bowel sounds are normal. He exhibits no distension and no mass. There is no tenderness. There is no rebound  and no guarding.  Musculoskeletal: He exhibits no edema.  Lymphadenopathy:    He has no cervical adenopathy.  Neurological: He is alert and oriented to person, place, and time. He displays normal reflexes. No cranial nerve deficit.  Skin: No rash noted.  Psychiatric: He has a normal mood and affect.          Assessment & Plan:  #1 health maintenance. Immunizations up to date. Consider repeat colonoscopy later this year. Discussed importance of regular exercise #2 hypertension stable continue Benicar 20 mg daily  #3 GERD stable continue Protonix  #4 hyperlipidemia check lipid panel and hepatic panel.

## 2011-12-04 NOTE — Patient Instructions (Signed)
Consider colonoscopy later this year.

## 2011-12-05 NOTE — Progress Notes (Signed)
Quick Note:  Pt aware ______ 

## 2011-12-11 ENCOUNTER — Encounter: Payer: Self-pay | Admitting: Family Medicine

## 2011-12-11 ENCOUNTER — Ambulatory Visit (INDEPENDENT_AMBULATORY_CARE_PROVIDER_SITE_OTHER): Payer: Medicare Other | Admitting: Family Medicine

## 2011-12-11 VITALS — BP 140/80 | Temp 98.1°F | Wt 173.0 lb

## 2011-12-11 DIAGNOSIS — J329 Chronic sinusitis, unspecified: Secondary | ICD-10-CM | POA: Diagnosis not present

## 2011-12-11 DIAGNOSIS — L820 Inflamed seborrheic keratosis: Secondary | ICD-10-CM

## 2011-12-11 MED ORDER — AMOXICILLIN-POT CLAVULANATE 875-125 MG PO TABS: 1.0000 | ORAL_TABLET | Freq: Two times a day (BID) | ORAL | Status: AC

## 2011-12-11 NOTE — Progress Notes (Signed)
  Subjective:    Patient ID: [name], male    DOB: 21-Nov-1936, 76 y.o.   MRN: [id number]  HPI  Patient seen for the following issues  Almost 2 week history of progressive frontal sinus pressure. Brownish to greenish nasal mucus. Intermittent dry cough. Initially had some low-grade fevers but none now. Intermittent body aches. Increased malaise. History of frequent sinusitis in the past. No antibiotic allergies.  Irritated seborrheic keratosis right anterior thigh. Requests treatment. This has been present for several years but recently irritated. No bleeding or itching.   Review of Systems  Constitutional: Positive for fatigue. Negative for fever and chills.  HENT: Positive for congestion and sinus pressure. Negative for ear pain and sore throat.   Respiratory: Positive for cough.        Objective:   Physical Exam  Constitutional: He appears well-developed and well-nourished.  HENT:  Right Ear: External ear normal.  Left Ear: External ear normal.  Mouth/Throat: Oropharynx is clear and moist.  Neck: Neck supple. No thyromegaly present.  Cardiovascular: Normal rate and regular rhythm.   Lymphadenopathy:    He has no cervical adenopathy.  Skin:       Patient has well demarcated scaly slightly raised symmetric irritated seborrheic keratosis right anterior mid thigh.          Assessment & Plan:  #1 acute sinusitis. Augmentin 875 mg twice daily for 10 days #2 irritated seborrheic keratosis right thigh. Discussed options for treatment. Recommend liquid nitrogen and patient consented after discussion of risks and benefits. Tolerated treatment well.  Followup in 2 weeks if not resolving

## 2011-12-11 NOTE — Patient Instructions (Signed)

## 2011-12-16 ENCOUNTER — Telehealth: Payer: Self-pay | Admitting: Family Medicine

## 2011-12-16 NOTE — Telephone Encounter (Signed)
Advised to stop Amoxicillin and take 50 mg of Benadryl.  Any other recommendations from Dr. Caryl Never?

## 2011-12-16 NOTE — Telephone Encounter (Signed)
Agree with advice given

## 2011-12-16 NOTE — Telephone Encounter (Signed)
Notified pt and posted as new allergy.

## 2011-12-16 NOTE — Telephone Encounter (Signed)
Pt was given script for Amoxicillin and now has rash on inner thigh and is itching on chest and back. Pt wants to know if he needs to continue on abx? Pls call.

## 2011-12-30 ENCOUNTER — Other Ambulatory Visit: Payer: Medicare Other | Admitting: *Deleted

## 2012-01-03 ENCOUNTER — Encounter: Payer: Self-pay | Admitting: Cardiovascular Disease

## 2012-01-03 ENCOUNTER — Ambulatory Visit (INDEPENDENT_AMBULATORY_CARE_PROVIDER_SITE_OTHER): Payer: Medicare Other | Admitting: Cardiovascular Disease

## 2012-01-03 VITALS — BP 136/88 | HR 66 | Ht 68.0 in | Wt 175.0 lb

## 2012-01-03 DIAGNOSIS — I2510 Atherosclerotic heart disease of native coronary artery without angina pectoris: Secondary | ICD-10-CM | POA: Diagnosis not present

## 2012-01-03 DIAGNOSIS — E785 Hyperlipidemia, unspecified: Secondary | ICD-10-CM

## 2012-01-03 MED ORDER — ATORVASTATIN CALCIUM 40 MG PO TABS: 40.0000 mg | ORAL_TABLET | Freq: Every day | ORAL | Status: DC

## 2012-01-03 NOTE — Assessment & Plan Note (Signed)
Werts doing well. He's not had any recurrent episodes of angina. We'll continue with the same medications. I'll see him again in one year. We'll check fasting labs at that time.

## 2012-01-03 NOTE — Patient Instructions (Signed)
Your physician wants you to follow-up in: 1 YEAR  You will receive a reminder letter in the mail two months in advance. If you don't receive a letter, please call our office to schedule the follow-up appointment.  Your physician recommends that you return for a FASTING lipid profile: 3 MONTHS   Your physician has recommended you make the following change in your medication:   INCREASE ATORVASTATIN TO 40 MG DAILY

## 2012-01-03 NOTE — Assessment & Plan Note (Signed)
His last LDL is a little elevated. We will increase his Lipitor to 40 mg a day. We'll check a fasting lipid profile again in 3 months.  I'll see him again in one year. We'll check fasting lipids at that time as well

## 2012-01-03 NOTE — Progress Notes (Signed)
    [name] Date of Birth  15-Feb-1936 [facility]     [place] [facility]  [address]    [address]   [address] [place], [place]  [address]    [place], [place]  [address] [phone]  Fax  [phone]  [phone]  Fax [phone]  Problem list: 1. Coronary artery disease- status post remote PTCA to the left complex artery in 1991 2. Hypertension 3. Hyperlipidemia  History of Present Illness:  [name] is  a 76 year old gentleman with a history of PTCA approximately 22 years ago.  He's not had any episodes of chest pain or shortness of breath. He stays fairly active although he doesn't exercise at the gym.  He has some problems with some arthritis-type pain in his right hip.  The Lipitor causes a little bit of muscular skeletal pain.  Current Outpatient Prescriptions on File Prior to Visit  Medication Sig Dispense Refill  . aspirin 81 MG tablet Take 81 mg by mouth daily.        [name] atorvastatin (LIPITOR) 20 MG tablet Take 20 mg by mouth daily.        [name] olmesartan (BENICAR) 40 MG tablet Take 40 mg by mouth daily.        . pantoprazole (PROTONIX) 40 MG tablet One tab every other day        Allergies  Allergen Reactions  . Hydrocodone     REACTION: disorientation  . Amoxicillin Rash    Past Medical History  Diagnosis Date  . HYPERLIPIDEMIA 04/06/2010  . BENIGN POSITIONAL VERTIGO 04/06/2010  . HYPERTENSION 04/06/2010  . CAD 04/06/2010  . GERD 04/06/2010  . SKIN LESION 04/06/2010  . OSTEOARTHRITIS, GENERALIZED 04/06/2010  . Acute sinusitis, unspecified 11/27/2010  . Coronary artery disease 2006    angioplasty    Past Surgical History  Procedure Date  . Angioplasty 1991  . Esophagas dilatation 1992  . Hernia repair 1994  . Prostate surgery 2006    History  Smoking status  . Former Smoker -- 2.0 packs/day for 10 years  . Types: Cigarettes  . Quit date: 12/03/1994  Smokeless tobacco  . Not on file    History  Alcohol Use: Not on file    Family  History  Problem Relation Age of Onset  . Cancer Father     prostate  . Arthritis Other   . Hyperlipidemia Other   . Hypertension Other     Reviw of Systems:  Reviewed in the HPI.  All other systems are negative.  Physical Exam: Blood pressure 136/88, pulse 66, height 5\' 8"  (1.727 m), weight 175 lb (79.379 kg). General: Well developed, well nourished, in no acute distress.  Head: Normocephalic, atraumatic, sclera non-icteric, mucus membranes are moist,   Neck: Supple. Negative for carotid bruits. JVD not elevated.  Lungs: Clear bilaterally to auscultation without wheezes, rales, or rhonchi. Breathing is unlabored.  Heart: RRR with S1 S2. No murmurs, rubs, or gallops appreciated.  Abdomen: Soft, non-tender, non-distended with normoactive bowel sounds. No hepatomegaly. No rebound/guarding. No obvious abdominal masses.  Msk:  Strength and tone appear normal for age.  Extremities: No clubbing or cyanosis. No edema.  Distal pedal pulses are 2+ and equal bilaterally.  Neuro: Alert and oriented X 3. Moves all extremities spontaneously.  Psych:  Responds to questions appropriately with a normal affect.  ECG: Normal sinus rhythm. He has occasional premature ventricular contractions.  Assessment / Plan:

## 2012-01-13 ENCOUNTER — Telehealth: Payer: Self-pay | Admitting: Cardiovascular Disease

## 2012-01-13 NOTE — Telephone Encounter (Signed)
New msg Pt wants refill of atorvastatin 40 mg He said that this rx should go to [facility] He said it went to [facility] by mistake Please call him back

## 2012-01-20 ENCOUNTER — Other Ambulatory Visit: Payer: Self-pay | Admitting: *Deleted

## 2012-01-20 MED ORDER — OLMESARTAN MEDOXOMIL 40 MG PO TABS: 40.0000 mg | ORAL_TABLET | Freq: Every day | ORAL | Status: DC

## 2012-01-20 NOTE — Telephone Encounter (Signed)
Fax Received. Refill Completed. [name] (R.M.A)   

## 2012-01-20 NOTE — Telephone Encounter (Signed)
Spoke with Becky from [facility] and she stated that it was too early to refill pt's lipitor according to their records

## 2012-03-05 ENCOUNTER — Ambulatory Visit (INDEPENDENT_AMBULATORY_CARE_PROVIDER_SITE_OTHER): Payer: Medicare Other | Admitting: Family Medicine

## 2012-03-05 ENCOUNTER — Encounter: Payer: Self-pay | Admitting: Family Medicine

## 2012-03-05 VITALS — BP 92/58 | Temp 97.9°F | Wt 173.0 lb

## 2012-03-05 DIAGNOSIS — A09 Infectious gastroenteritis and colitis, unspecified: Secondary | ICD-10-CM | POA: Diagnosis not present

## 2012-03-05 DIAGNOSIS — A084 Viral intestinal infection, unspecified: Secondary | ICD-10-CM

## 2012-03-05 NOTE — Progress Notes (Signed)
  Subjective:    Patient ID: MERCURY ROCK, male    DOB: 09/30/1936, 76 y.o.   MRN: [id number]  HPI  Patient seen with nausea vomiting and weakness. Onset Monday. Several family members have similar illness. Vomited Monday only but none since then. Mild nausea but is keeping down fluids. Diarrhea persists. Nonbloody. Denies any fever or chills. No abdominal pain. Took some Pepto-Bismol and Kaopectate which helped diarrhea slightly. Has occasional lightheadedness but no syncope. He takes Benicar for hypertension.   Review of Systems  Constitutional: Positive for appetite change and fatigue. Negative for fever and chills.  Respiratory: Negative for shortness of breath.   Cardiovascular: Negative for chest pain.  Gastrointestinal: Positive for diarrhea. Negative for vomiting, abdominal pain and blood in stool.  Genitourinary: Negative for dysuria.       Objective:   Physical Exam  Constitutional: He appears well-developed and well-nourished.  HENT:  Mouth/Throat: Oropharynx is clear and moist.  Neck: Neck supple.  Cardiovascular: Normal rate and regular rhythm.   Pulmonary/Chest: Effort normal and breath sounds normal. No respiratory distress. He has no wheezes. He has no rales.  Abdominal: Soft. He exhibits no mass. There is no tenderness. There is no rebound and no guarding.          Assessment & Plan:  Viral gastroenteritis. Slowly improving. Hold Benicar for couple days until diarrhea resolved. Handout given on diarrhea. Consider Imodium for repeated diarrhea symptoms. Reviewed electrolyte replacement with sodium and potassium

## 2012-03-05 NOTE — Patient Instructions (Signed)
Diarrhea Infections caused by germs (bacterial) or a virus commonly cause diarrhea. Your caregiver has determined that with time, rest and fluids, the diarrhea should improve. In general, eat normally while drinking more water than usual. Although water may prevent dehydration, it does not contain salt and minerals (electrolytes). Broths, weak tea without caffeine and oral rehydration solutions (ORS) replace fluids and electrolytes. Small amounts of fluids should be taken frequently. Large amounts at one time may not be tolerated. Plain water may be harmful in infants and the elderly. Oral rehydrating solutions (ORS) are available at pharmacies and grocery stores. ORS replace water and important electrolytes in proper proportions. Sports drinks are not as effective as ORS and may be harmful due to sugars worsening diarrhea.  ORS is especially recommended for use in children with diarrhea. As a general guideline for children, replace any new fluid losses from diarrhea and/or vomiting with ORS as follows:   If your child weighs 22 pounds or under (10 kg or less), give 60-120 mL ( -  cup or 2 - 4 ounces) of ORS for each episode of diarrheal stool or vomiting episode.   If your child weighs more than 22 pounds (more than 10 kgs), give 120-240 mL ( - 1 cup or 4 - 8 ounces) of ORS for each diarrheal stool or episode of vomiting.   While correcting for dehydration, children should eat normally. However, foods high in sugar should be avoided because this may worsen diarrhea. Large amounts of carbonated soft drinks, juice, gelatin desserts and other highly sugared drinks should be avoided.   After correction of dehydration, other liquids that are appealing to the child may be added. Children should drink small amounts of fluids frequently and fluids should be increased as tolerated. Children should drink enough fluids to keep urine clear or pale yellow.   Adults should eat normally while drinking more fluids  than usual. Drink small amounts of fluids frequently and increase as tolerated. Drink enough fluids to keep urine clear or pale yellow. Broths, weak decaffeinated tea, lemon lime soft drinks (allowed to go flat) and ORS replace fluids and electrolytes.   Avoid:   Carbonated drinks.   Juice.   Extremely hot or cold fluids.   Caffeine drinks.   Fatty, greasy foods.   Alcohol.   Tobacco.   Too much intake of anything at one time.   Gelatin desserts.   Probiotics are active cultures of beneficial bacteria. They may lessen the amount and number of diarrheal stools in adults. Probiotics can be found in yogurt with active cultures and in supplements.   Wash hands well to avoid spreading bacteria and virus.   Anti-diarrheal medications are not recommended for infants and children.   Only take over-the-counter or prescription medicines for pain, discomfort or fever as directed by your caregiver. Do not give aspirin to children because it may cause Reye's Syndrome.   For adults, ask your caregiver if you should continue all prescribed and over-the-counter medicines.   If your caregiver has given you a follow-up appointment, it is very important to keep that appointment. Not keeping the appointment could result in a chronic or permanent injury, and disability. If there is any problem keeping the appointment, you must call back to this facility for assistance.  SEEK IMMEDIATE MEDICAL CARE IF:   You or your child is unable to keep fluids down or other symptoms or problems become worse in spite of treatment.   Vomiting or diarrhea develops and becomes persistent.     There is vomiting of blood or bile (green material).   There is blood in the stool or the stools are black and tarry.   There is no urine output in 6-8 hours or there is only a small amount of very dark urine.   Abdominal pain develops, increases or localizes.   You have a fever.   Your baby is older than 3 months with a  rectal temperature of 102 F (38.9 C) or higher.   Your baby is 40 months old or younger with a rectal temperature of 100.4 F (38 C) or higher.   You or your child develops excessive weakness, dizziness, fainting or extreme thirst.   You or your child develops a rash, stiff neck, severe headache or become irritable or sleepy and difficult to awaken.  MAKE SURE YOU:   Understand these instructions.   Will watch your condition.   Will get help right away if you are not doing well or get worse.  Document Released: 11/08/2002 Document Revised: 11/07/2011 Document Reviewed: 09/25/2009 [facility] Patient Information 2012 [place], [place].  Hold Benicar for a couple of days until diarrhea resolving. Consider over the counter Imodium if diarrhea persists.

## 2012-04-02 ENCOUNTER — Ambulatory Visit (INDEPENDENT_AMBULATORY_CARE_PROVIDER_SITE_OTHER): Payer: Medicare Other | Admitting: *Deleted

## 2012-04-02 DIAGNOSIS — E785 Hyperlipidemia, unspecified: Secondary | ICD-10-CM

## 2012-04-02 DIAGNOSIS — I10 Essential (primary) hypertension: Secondary | ICD-10-CM

## 2012-04-02 LAB — BASIC METABOLIC PANEL
BUN: 22 mg/dL (ref 6–23)
CO2: 25 mEq/L (ref 19–32)
Calcium: 9.3 mg/dL (ref 8.4–10.5)
Chloride: 108 mEq/L (ref 96–112)
Creatinine, Ser: 1.5 mg/dL (ref 0.4–1.5)
GFR: 48.98 mL/min — ABNORMAL LOW (ref >60.00)
Glucose, Bld: 99 mg/dL (ref 70–99)
Potassium: 4.2 mEq/L (ref 3.5–5.1)
Sodium: 140 mEq/L (ref 135–145)

## 2012-04-02 LAB — HEPATIC FUNCTION PANEL
ALT: 26 U/L (ref 0–53)
AST: 24 U/L (ref 0–37)
Albumin: 4.1 g/dL (ref 3.5–5.2)
Alkaline Phosphatase: 55 U/L (ref 39–117)
Bilirubin, Direct: 0.1 mg/dL (ref 0.0–0.3)
Total Bilirubin: 0.8 mg/dL (ref 0.3–1.2)
Total Protein: 6.8 g/dL (ref 6.0–8.3)

## 2012-04-02 LAB — LIPID PANEL
Cholesterol: 124 mg/dL (ref 0–200)
HDL: 45.3 mg/dL (ref >39.00)
LDL Cholesterol: 60 mg/dL (ref 0–99)
Total CHOL/HDL Ratio: 3
Triglycerides: 94 mg/dL (ref 0.0–149.0)
VLDL: 18.8 mg/dL (ref 0.0–40.0)

## 2012-04-10 DIAGNOSIS — R972 Elevated prostate specific antigen [PSA]: Secondary | ICD-10-CM | POA: Diagnosis not present

## 2012-04-10 DIAGNOSIS — C61 Malignant neoplasm of prostate: Secondary | ICD-10-CM | POA: Diagnosis not present

## 2012-04-17 DIAGNOSIS — R972 Elevated prostate specific antigen [PSA]: Secondary | ICD-10-CM | POA: Diagnosis not present

## 2012-04-17 DIAGNOSIS — N529 Male erectile dysfunction, unspecified: Secondary | ICD-10-CM | POA: Diagnosis not present

## 2012-06-02 ENCOUNTER — Encounter: Payer: Self-pay | Admitting: Family Medicine

## 2012-06-02 ENCOUNTER — Ambulatory Visit (INDEPENDENT_AMBULATORY_CARE_PROVIDER_SITE_OTHER): Payer: Medicare Other | Admitting: Family Medicine

## 2012-06-02 VITALS — BP 140/78 | Temp 98.1°F | Wt 169.0 lb

## 2012-06-02 DIAGNOSIS — I714 Abdominal aortic aneurysm, without rupture: Secondary | ICD-10-CM | POA: Diagnosis not present

## 2012-06-02 MED ORDER — PANTOPRAZOLE SODIUM 40 MG PO TBEC: 40.0000 mg | DELAYED_RELEASE_TABLET | Freq: Every day | ORAL | Status: DC

## 2012-06-02 NOTE — Patient Instructions (Addendum)
Tennis Elbow Your caregiver has diagnosed you with a condition often referred to as "tennis elbow." This results from small tears or soreness (inflammation) at the start (origin) of the extensor muscles of the forearm. Although the condition is often called tennis or golfer's elbow, it is caused by any repetitive action performed by your elbow. HOME CARE INSTRUCTIONS  If the condition has been short lived, rest may be the only treatment required. Using your opposite hand or arm to perform the task may help. Even changing your grip may help rest the extremity. These may even prevent the condition from recurring.   Longer standing problems, however, will often be relieved faster by:   Using anti-inflammatory agents.   Applying ice packs for 30 minutes at the end of the working day, at bed time, or when activities are finished.   Your caregiver may also have you wear a splint or sling. This will allow the inflamed tendon to heal.  At times, steroid injections aided with a local anesthetic will be required along with splinting for 1 to 2 weeks. Two to three steroid injections will often solve the problem. In some long standing cases, the inflamed tendon does not respond to conservative (non-surgical) therapy. Then surgery may be required to repair it. MAKE SURE YOU:   Understand these instructions.   Will watch your condition.   Will get help right away if you are not doing well or get worse.  Document Released: 11/18/2005 Document Revised: 11/07/2011 Document Reviewed: 07/06/2008 [facility] Patient Information 2012 [place], [place].  Consider tennis elbow band.  Consider steroid injection if no better in 2-3 weeks if pain is impairing activity.

## 2012-06-02 NOTE — Progress Notes (Signed)
  Subjective:    Patient ID: [name], male    DOB: 1936/09/24, 76 y.o.   MRN: [id number]  HPI  Right elbow pain for over one week. No injury. Doing lots of painting recently. Location is lateral elbow. Worse with gripping. Tried icing but only 2 times without much improvement. Did see slight improvement in pain afterwards. No anti-inflammatory medications. No weakness. No numbness.  Recent [employer] screening. Unremarkable with the exception of 3.7 x 3.7 cm abdominal aortic aneurysm. No family history of aneurysm. Patient is a former smoker. Hypertension treated with Benicar. [employer] screening results reviewed with patient.  Other studies included carotid screening, EKG, bone density screen, ABIs and these were normal.  Past Medical History  Diagnosis Date  . HYPERLIPIDEMIA 04/06/2010  . BENIGN POSITIONAL VERTIGO 04/06/2010  . HYPERTENSION 04/06/2010  . CAD 04/06/2010  . GERD 04/06/2010  . SKIN LESION 04/06/2010  . OSTEOARTHRITIS, GENERALIZED 04/06/2010  . Acute sinusitis, unspecified 11/27/2010  . Coronary artery disease 2006    angioplasty   Past Surgical History  Procedure Date  . Angioplasty 1991  . Esophagas dilatation 1992  . Hernia repair 1994  . Prostate surgery 2006    reports that he quit smoking about 17 years ago. His smoking use included Cigarettes. He has a 20 pack-year smoking history. He does not have any smokeless tobacco history on file. His alcohol and drug histories not on file. family history includes Arthritis in his other; Cancer in his father; Hyperlipidemia in his other; and Hypertension in his other. Allergies  Allergen Reactions  . Hydrocodone     REACTION: disorientation  . Amoxicillin Rash      Review of Systems  Cardiovascular: Negative for chest pain.  Gastrointestinal: Negative for abdominal pain.  Neurological: Negative for weakness and numbness.  Hematological: Negative for adenopathy.       Objective:   Physical Exam  Constitutional: He  appears well-developed and well-nourished.  Cardiovascular: Normal rate and regular rhythm.   Pulmonary/Chest: Effort normal and breath sounds normal. No respiratory distress. He has no wheezes. He has no rales.  Abdominal: Soft. There is no tenderness.  Musculoskeletal:       Right elbow full range of motion. No warmth or erythema. Tender lateral epicondylar region. Pain with extension of wrist against resistance.  Neurological:       Full strength upper extremities          Assessment & Plan:  #1 right lateral epicondylitis. Recommend regular icing. Tennis elbow strap. Avoid gripping activities repetitively. Offered steroid injections if not improving over the next several weeks. #2 abdominal aortic aneurysm by recent [employer] screening. Repeat ultrasound in one year to reassess

## 2012-06-29 ENCOUNTER — Encounter: Payer: Self-pay | Admitting: Family Medicine

## 2012-06-29 ENCOUNTER — Ambulatory Visit (INDEPENDENT_AMBULATORY_CARE_PROVIDER_SITE_OTHER): Payer: Medicare Other | Admitting: Family Medicine

## 2012-06-29 VITALS — BP 120/70 | Temp 98.1°F | Wt 170.0 lb

## 2012-06-29 DIAGNOSIS — T6391XA Toxic effect of contact with unspecified venomous animal, accidental (unintentional), initial encounter: Secondary | ICD-10-CM | POA: Diagnosis not present

## 2012-06-29 DIAGNOSIS — T63461A Toxic effect of venom of wasps, accidental (unintentional), initial encounter: Secondary | ICD-10-CM

## 2012-06-29 NOTE — Patient Instructions (Addendum)
Bee, Wasp, or Hornet Sting Your caregiver has diagnosed you as having an insect sting. An insect sting appears as a red lump in the skin that sometimes has a tiny hole in the center, or it may have a stinger in the center of the wound. The most common stings are from wasps, hornets and bees. Individuals have different reactions to insect stings.  A normal reaction may cause pain, swelling, and redness around the sting site.   A localized allergic reaction may cause swelling and redness that extends beyond the sting site.   A large local reaction may continue to develop over the next 12 to 36 hours.   On occasion, the reactions can be severe (anaphylactic reaction). An anaphylactic reaction may cause wheezing; difficulty breathing; chest pain; fainting; raised, itchy, red patches on the skin; a sick feeling to your stomach (nausea); vomiting; cramping; or diarrhea. If you have had an anaphylactic reaction to an insect sting in the past, you are more likely to have one again.  HOME CARE INSTRUCTIONS   With bee stings, a small sac of poison is left in the wound. Brushing across this with something such as a credit card, or anything similar, will help remove this and decrease the amount of the reaction. This same procedure will not help a wasp sting as they do not leave behind a stinger and poison sac.   Apply a cold compress for 10 to 20 minutes every hour for 1 to 2 days, depending on severity, to reduce swelling and itching.   To lessen pain, a paste made of water and baking soda may be rubbed on the bite or sting and left on for 5 minutes.   To relieve itching and swelling, you may use take medication or apply medicated creams or lotions as directed.   Only take over-the-counter or prescription medicines for pain, discomfort, or fever as directed by your caregiver.   Wash the sting site daily with soap and water. Apply antibiotic ointment on the sting site as directed.   If you suffered a  severe reaction:   If you did not require hospitalization, an adult will need to stay with you for 24 hours in case the symptoms return.   You may need to wear a medical bracelet or necklace stating the allergy.   You and your family need to learn when and how to use an anaphylaxis kit or epinephrine injection.   If you have had a severe reaction before, always carry your anaphylaxis kit with you.  SEEK MEDICAL CARE IF:   None of the above helps within 2 to 3 days.   The area becomes red, warm, tender, and swollen beyond the area of the bite or sting.   You have an oral temperature above 102 F (38.9 C).  SEEK IMMEDIATE MEDICAL CARE IF:  You have symptoms of an allergic reaction which are:  Wheezing.   Difficulty breathing.   Chest pain.   Lightheadedness or fainting.   Itchy, raised, red patches on the skin.   Nausea, vomiting, cramping or diarrhea.  ANY OF THESE SYMPTOMS MAY REPRESENT A SERIOUS PROBLEM THAT IS AN EMERGENCY. Do not wait to see if the symptoms will go away. Get medical help right away. Call your local emergency services (911 in [place] DO NOT drive yourself to the hospital. MAKE SURE YOU:   Understand these instructions.   Will watch your condition.   Will get help right away if you are not doing well or  get worse.  Document Released: 11/18/2005 Document Revised: 11/07/2011 Document Reviewed: 05/05/2010 [facility] Patient Information 2012 [place], [place].  May take up to 50 mg of benadryl every 6 hours May also supplement with other antihistamine such as [employer].

## 2012-06-29 NOTE — Progress Notes (Signed)
  Subjective:    Patient ID: [name], male    DOB: Jan 04, 1936, 76 y.o.   MRN: [id number]  HPI  Patient seen with WASP sting right hand. Occurred on Saturday. Had some swelling and redness and warmth no fever or chills. Starting to itch. Using children's Benadryl once daily which helps slightly. He did not have any hives or other evidence for generalized allergic reaction. No dyspnea. No wheezing.   Review of Systems  Constitutional: Negative for fever and chills.  Respiratory: Negative for shortness of breath and wheezing.        Objective:   Physical Exam  Constitutional: He appears well-developed and well-nourished.  Cardiovascular: Normal rate and regular rhythm.   Pulmonary/Chest: Effort normal and breath sounds normal. No respiratory distress. He has no wheezes. He has no rales.  Musculoskeletal:       Right hand base of palm reveals area of erythema about 4 x 6 cm. Nontender. Minimal warmth.          Assessment & Plan:  Localized allergic reaction right hand from WASP sting. Continue Benadryl and consider supplement with Claritin or Allegra.

## 2012-07-10 DIAGNOSIS — R972 Elevated prostate specific antigen [PSA]: Secondary | ICD-10-CM | POA: Diagnosis not present

## 2012-07-17 DIAGNOSIS — C61 Malignant neoplasm of prostate: Secondary | ICD-10-CM | POA: Diagnosis not present

## 2012-07-17 DIAGNOSIS — R972 Elevated prostate specific antigen [PSA]: Secondary | ICD-10-CM | POA: Diagnosis not present

## 2012-08-04 ENCOUNTER — Ambulatory Visit: Payer: Medicare Other

## 2012-08-04 ENCOUNTER — Ambulatory Visit: Payer: Medicare Other | Admitting: Radiation Oncology

## 2012-08-05 ENCOUNTER — Encounter: Payer: Self-pay | Admitting: *Deleted

## 2012-08-06 ENCOUNTER — Ambulatory Visit
Admission: RE | Admit: 2012-08-06 | Discharge: 2012-08-06 | Disposition: A | Discharge status: Home / Self Care | Payer: Medicare Other | Source: Ambulatory Visit | Attending: Radiation Oncology | Admitting: Radiation Oncology

## 2012-08-06 ENCOUNTER — Encounter: Payer: Self-pay | Admitting: Radiation Oncology

## 2012-08-06 VITALS — BP 141/71 | HR 89 | Temp 97.9°F | Resp 20 | Wt 167.3 lb

## 2012-08-06 DIAGNOSIS — N342 Other urethritis: Secondary | ICD-10-CM | POA: Insufficient documentation

## 2012-08-06 DIAGNOSIS — E785 Hyperlipidemia, unspecified: Secondary | ICD-10-CM | POA: Diagnosis not present

## 2012-08-06 DIAGNOSIS — K5900 Constipation, unspecified: Secondary | ICD-10-CM | POA: Insufficient documentation

## 2012-08-06 DIAGNOSIS — I10 Essential (primary) hypertension: Secondary | ICD-10-CM | POA: Diagnosis not present

## 2012-08-06 DIAGNOSIS — C61 Malignant neoplasm of prostate: Secondary | ICD-10-CM

## 2012-08-06 DIAGNOSIS — K6289 Other specified diseases of anus and rectum: Secondary | ICD-10-CM | POA: Diagnosis not present

## 2012-08-06 DIAGNOSIS — J3489 Other specified disorders of nose and nasal sinuses: Secondary | ICD-10-CM | POA: Insufficient documentation

## 2012-08-06 DIAGNOSIS — K602 Anal fissure, unspecified: Secondary | ICD-10-CM | POA: Diagnosis not present

## 2012-08-06 DIAGNOSIS — Z79899 Other long term (current) drug therapy: Secondary | ICD-10-CM | POA: Diagnosis not present

## 2012-08-06 DIAGNOSIS — Z510 Encounter for antineoplastic radiation therapy: Secondary | ICD-10-CM | POA: Diagnosis not present

## 2012-08-06 DIAGNOSIS — Z7982 Long term (current) use of aspirin: Secondary | ICD-10-CM | POA: Insufficient documentation

## 2012-08-06 DIAGNOSIS — K649 Unspecified hemorrhoids: Secondary | ICD-10-CM | POA: Insufficient documentation

## 2012-08-06 DIAGNOSIS — Z87891 Personal history of nicotine dependence: Secondary | ICD-10-CM | POA: Insufficient documentation

## 2012-08-06 DIAGNOSIS — Y842 Radiological procedure and radiotherapy as the cause of abnormal reaction of the patient, or of later complication, without mention of misadventure at the time of the procedure: Secondary | ICD-10-CM | POA: Insufficient documentation

## 2012-08-06 DIAGNOSIS — I2510 Atherosclerotic heart disease of native coronary artery without angina pectoris: Secondary | ICD-10-CM | POA: Diagnosis not present

## 2012-08-06 DIAGNOSIS — K219 Gastro-esophageal reflux disease without esophagitis: Secondary | ICD-10-CM | POA: Diagnosis not present

## 2012-08-06 NOTE — Progress Notes (Signed)
[name] Radiation Oncology NEW PATIENT EVALUATION  Name: [name] MRN: [id number]  Date:   08/06/2012           DOB: 1936/01/22  Status: outpatient   CC: Bruce Covey, MD  [name],*    REFERRING PHYSICIAN: Milford Roberts,*   DIAGNOSIS: PSA recurrent carcinoma the prostate  HISTORY OF PRESENT ILLNESS:  [name] is a 76 y.o. male who is seen today for the courtesy of Dr. Margarita Roberts for discussion of possible salvage radiation therapy in the management of his PSA recurrent carcinoma the prostate.  The patient presented with an elevated PSA of 7.2 in the fall 2006. Needle core biopsies by Dr. Shiela Mayer on 09/07/2007 revealed adenocarcinoma with a Gleason score 7 (3+4) involving 70% of the tissue submitted from the left gland. His staging workup was negative. On 11/18/2005 she underwent a radical prostatectomy with a Gleason score of 7 (4+3) involving both lobes with perineural invasion. 20% of the prostate tissue containing carcinoma. There was no margin involvement or extracapsular extension. 2 lymph nodes were free of metastatic disease. His postoperative PSA was undetectable until November of 2012 was found to be 0.19. His rose to 0.33 by May 2013 and most recently 0.4 on 07/10/2012. He is doing reasonably well from a GU and GI standpoint. He is dry. He does have erectile dysfunction. He is kindly referred by Dr. Margarita Roberts today for discussion of salvage radiation therapy.  PREVIOUS RADIATION THERAPY: No   PAST MEDICAL HISTORY:  has a past medical history of HYPERLIPIDEMIA (04/06/2010); BENIGN POSITIONAL VERTIGO (04/06/2010); HYPERTENSION (04/06/2010); CAD (04/06/2010); GERD (04/06/2010); SKIN LESION (04/06/2010); Acute sinusitis, unspecified (11/27/2010); Coronary artery disease (2006); Prostate cancer (09/06/05 dx); Sleep apnea; Anal fissure; Allergy; Joint pain; and OSTEOARTHRITIS, GENERALIZED (04/06/2010).     PAST SURGICAL HISTORY:  Past Surgical  History  Procedure Date  . Angioplasty 1991  . Esophagas dilatation 1992  . Hernia repair 1994    b/l groin area  . Prostate surgery 11/18/05    prostatectomy, adenocarcinoma radical  . Back surgery   . Knee surgery     right arthoscopic     FAMILY HISTORY: family history includes Arthritis in his other; Cancer in his brother and father; Hyperlipidemia in his other; and Hypertension in his other. His father was diagnosed with prostate cancer in his 26s. He.heart attack in his 90s. His mother died from complications of bowel truck she and her 109s. A brother was diagnosed with prostate cancer in his 40s.   SOCIAL HISTORY:  reports that he quit smoking about 17 years ago. His smoking use included Cigarettes. He has a 20 pack-year smoking history. He does not have any smokeless tobacco history on file. He reports that he drinks alcohol. He reports that he does not use illicit drugs. Married, 2 children. Retired Lawyer.   ALLERGIES: Hydrocodone and Amoxicillin   MEDICATIONS:  Current Outpatient Prescriptions  Medication Sig Dispense Refill  . aspirin 81 MG tablet Take 81 mg by mouth daily.        [name] atorvastatin (LIPITOR) 40 MG tablet Take 1 tablet (40 mg total) by mouth daily.  90 tablet  3  . naproxen sodium (ANAPROX) 220 MG tablet Take 220 mg by mouth as needed.      [name] olmesartan (BENICAR) 40 MG tablet Take 1 tablet (40 mg total) by mouth daily.  90 tablet  3  . pantoprazole (PROTONIX) 40 MG tablet Take 1 tablet (40 mg  total) by mouth daily.  90 tablet  3     REVIEW OF SYSTEMS:  Pertinent items are noted in HPI.    PHYSICAL EXAM:  weight is 167 lb 4.8 oz (75.887 kg). His oral temperature is 97.9 F (36.6 C). His blood pressure is 141/71 and his pulse is 89. His respiration is 20.   Alert and oriented 76 year old white male appearing younger than his stated age. Head and neck examination: Grossly unremarkable. Nodes: Without palpable cervical or  supraclavicular lymphadenopathy. Chest: Lungs clear. Back: Without spinal or CVA tenderness. Heart: Regular rate rhythm. Abdomen: Without masses organomegaly. Genitalia: Unremarkable to inspection. Rectal: Prostate bed is flat and is without masses or nodularity. Extremities: Without edema.   LABORATORY DATA:  Lab Results  Component Value Date   WBC 5.4 10/31/2010   HGB 14.8 10/31/2010   HCT 42.5 10/31/2010   MCV 102.5* 10/31/2010   PLT 156.0 10/31/2010   Lab Results  Component Value Date   NA 140 04/02/2012   K 4.2 04/02/2012   CL 108 04/02/2012   CO2 25 04/02/2012   Lab Results  Component Value Date   ALT 26 04/02/2012   AST 24 04/02/2012   ALKPHOS 55 04/02/2012   BILITOT 0.8 04/02/2012   PSA 0.4 from 07/10/2012.   IMPRESSION: PSA recurrent carcinoma the prostate. I explained to [name] that the likelihood for local recurrence, and thus possible cure with radiation therapy increases with a positive margin, extracapsular extension, and a disease-free interval of greater than 2-3 years. While he does not have evidence of a positive margins or extracapsular extension, he does have a significant disease-free interval of approximately 6 years. Therefore, I feel that it would be reasonable to offer him salvage radiation therapy. His PSA doubling time is just over 1 year, thus I feel he has biologically active disease at may be a problem within 5-10 years. There is felt to be a "window of opportunity" for salvage radiation therapy with a PSA of between 0.2 and 0.5. I feel that a further rise in his PSA would decrease his chance for salvage with radiation therapy. We discussed the potential acute and late toxicities of radiation therapy and he is interested in proceeding with salvage radiation therapy. He would like to return for simulation/treatment planning next week. I anticipate delivering 6600 cGy in 33 sessions. He'll be treated with IMRT/Tomotherapy along with a comfortably full bladder to minimize  his treatment-related toxicity. His treatment should be well tolerated. He was given literature for review.   PLAN: As discussed above. He'll be tentatively scheduled for CT simulation on Wednesday, September 11.   I spent 60 minutes minutes face to face with the patient and more than 50% of that time was spent in counseling and/or coordination of care.

## 2012-08-06 NOTE — Progress Notes (Signed)
Please see the Nurse Progress Note in the MD Initial Consult Encounter for this patient. 

## 2012-08-06 NOTE — Progress Notes (Signed)
New Consult to discuss salvage radiation Prostate Cancer dx 2006,gleason 4+3=7,  PSA ranged from <0.1-0.13 through 2012 Last PSA  07/10/12= 0.40 Robotic Radical  Prostatectomy 2006 Patient alert,oriented x3,  No dysuria when voiding but has pain there occasionally ,some hesitancy and feeling of not completely emptying his bladder at times No c/o pain at present,

## 2012-08-12 ENCOUNTER — Ambulatory Visit: Admission: RE | Admit: 2012-08-12 | Payer: Medicare Other | Source: Ambulatory Visit

## 2012-08-12 ENCOUNTER — Ambulatory Visit
Admission: RE | Admit: 2012-08-12 | Discharge: 2012-08-12 | Disposition: A | Discharge status: Home / Self Care | Payer: Medicare Other | Source: Ambulatory Visit | Attending: Radiation Oncology | Admitting: Radiation Oncology

## 2012-08-12 DIAGNOSIS — K6289 Other specified diseases of anus and rectum: Secondary | ICD-10-CM | POA: Diagnosis not present

## 2012-08-12 DIAGNOSIS — Z510 Encounter for antineoplastic radiation therapy: Secondary | ICD-10-CM | POA: Diagnosis not present

## 2012-08-12 DIAGNOSIS — K5900 Constipation, unspecified: Secondary | ICD-10-CM | POA: Diagnosis not present

## 2012-08-12 DIAGNOSIS — C61 Malignant neoplasm of prostate: Secondary | ICD-10-CM

## 2012-08-12 DIAGNOSIS — K602 Anal fissure, unspecified: Secondary | ICD-10-CM | POA: Diagnosis not present

## 2012-08-12 DIAGNOSIS — N342 Other urethritis: Secondary | ICD-10-CM | POA: Diagnosis not present

## 2012-08-12 NOTE — Progress Notes (Signed)
Simulation/treatment planning note  The patient was taken to the CT simulator. A custom vac lock bag was constructed for immobilization. A red rubber tube was placed within the rectal vault. He was in catheterized and contrast instilled into the bladder/urethra. I contoured his high-risk prostate bed, CTV 6600 and expanded this by 0.5 cm to create PTV 6600. I then contoured his low-risk prostate bed, CTV 5445 and expanded this by 0.5 cm to create PTV 5445. Each PTV will receive 6600 cGy in 5445 cGy in 33 sessions. I requesting daily MV CT, setting up to his anterior rectum. He is now ready for IMRT simulation/planning.

## 2012-08-17 ENCOUNTER — Encounter: Payer: Self-pay | Admitting: Radiation Oncology

## 2012-08-17 DIAGNOSIS — K602 Anal fissure, unspecified: Secondary | ICD-10-CM | POA: Diagnosis not present

## 2012-08-17 DIAGNOSIS — K5900 Constipation, unspecified: Secondary | ICD-10-CM | POA: Diagnosis not present

## 2012-08-17 DIAGNOSIS — Z510 Encounter for antineoplastic radiation therapy: Secondary | ICD-10-CM | POA: Diagnosis not present

## 2012-08-17 DIAGNOSIS — C61 Malignant neoplasm of prostate: Secondary | ICD-10-CM | POA: Diagnosis not present

## 2012-08-17 DIAGNOSIS — N342 Other urethritis: Secondary | ICD-10-CM | POA: Diagnosis not present

## 2012-08-17 DIAGNOSIS — K6289 Other specified diseases of anus and rectum: Secondary | ICD-10-CM | POA: Diagnosis not present

## 2012-08-17 NOTE — Progress Notes (Signed)
IMRT simulation/treatment planning note:  The patient underwent IMRT simulation/treatment planning in the management of his PSA recurrent carcinoma the prostate. IMRT was chosen to decrease the risk for both acute and late bladder and rectal toxicity compared to conventional or 3-D conformal radiation therapy. Dose volume histograms were obtained for the target PTV (high-risk and low risk prostate tumor bed). Dose volume histograms were also obtained for the bladder, rectum and femoral heads. We met our departmental goals. Please see the electronic medical record for specific dose volume histograms. I prescribing 6600 cGy to his prostate bed PTV and 33 sessions utilizing 6 MV photons helical IMRT Tomotherapy. His low risk prostate bed will receive 5445 cGy in 33 sessions. He'll undergo daily image guidance with MV CT, setting up to his anterior rectum. He is be treated with a comfortably full bladder.

## 2012-08-24 ENCOUNTER — Encounter: Payer: Self-pay | Admitting: Radiation Oncology

## 2012-08-24 ENCOUNTER — Ambulatory Visit
Admission: RE | Admit: 2012-08-24 | Discharge: 2012-08-24 | Disposition: A | Discharge status: Home / Self Care | Payer: Medicare Other | Source: Ambulatory Visit | Attending: Radiation Oncology | Admitting: Radiation Oncology

## 2012-08-24 DIAGNOSIS — K5900 Constipation, unspecified: Secondary | ICD-10-CM | POA: Diagnosis not present

## 2012-08-24 DIAGNOSIS — C61 Malignant neoplasm of prostate: Secondary | ICD-10-CM | POA: Diagnosis not present

## 2012-08-24 DIAGNOSIS — K6289 Other specified diseases of anus and rectum: Secondary | ICD-10-CM | POA: Diagnosis not present

## 2012-08-24 DIAGNOSIS — N342 Other urethritis: Secondary | ICD-10-CM | POA: Diagnosis not present

## 2012-08-24 DIAGNOSIS — K602 Anal fissure, unspecified: Secondary | ICD-10-CM | POA: Diagnosis not present

## 2012-08-24 DIAGNOSIS — Z510 Encounter for antineoplastic radiation therapy: Secondary | ICD-10-CM | POA: Diagnosis not present

## 2012-08-24 NOTE — Progress Notes (Signed)
Weekly Management Note:  Site:Prostate bed Current Dose:  200  cGy Projected Dose: 6600  cGy  Narrative: The patient is seen today for routine under treatment assessment. CBCT/MVCT images/port films were reviewed. Bladder filling is satisfactory. The chart was reviewed.   No complaints today except for rectal "tenderness" for the past few days. He has been using preparation H. Otherwise, no GU or GI difficulties.  Physical Examination: There were no vitals filed for this visit..  Weight:  . No change.  Impression: Tolerating radiation therapy well.  Plan: Continue radiation therapy as planned.

## 2012-08-24 NOTE — Progress Notes (Signed)
The patient underwent Tomotherapy segmentation today in the management of his carcinoma the prostate. He is being treated to 5.8 delivered field widths corresponding to one set of IMRT treatment devices [phone]).

## 2012-08-24 NOTE — Progress Notes (Signed)
First Fraction to Prostate today.  No voiced concerns presently

## 2012-08-25 ENCOUNTER — Ambulatory Visit
Admission: RE | Admit: 2012-08-25 | Discharge: 2012-08-25 | Disposition: A | Discharge status: Home / Self Care | Payer: Medicare Other | Source: Ambulatory Visit | Attending: Radiation Oncology | Admitting: Radiation Oncology

## 2012-08-25 ENCOUNTER — Ambulatory Visit (INDEPENDENT_AMBULATORY_CARE_PROVIDER_SITE_OTHER): Payer: Medicare Other

## 2012-08-25 DIAGNOSIS — K5900 Constipation, unspecified: Secondary | ICD-10-CM | POA: Diagnosis not present

## 2012-08-25 DIAGNOSIS — N342 Other urethritis: Secondary | ICD-10-CM | POA: Diagnosis not present

## 2012-08-25 DIAGNOSIS — K6289 Other specified diseases of anus and rectum: Secondary | ICD-10-CM | POA: Diagnosis not present

## 2012-08-25 DIAGNOSIS — C61 Malignant neoplasm of prostate: Secondary | ICD-10-CM | POA: Diagnosis not present

## 2012-08-25 DIAGNOSIS — K602 Anal fissure, unspecified: Secondary | ICD-10-CM | POA: Diagnosis not present

## 2012-08-25 DIAGNOSIS — Z510 Encounter for antineoplastic radiation therapy: Secondary | ICD-10-CM | POA: Diagnosis not present

## 2012-08-25 DIAGNOSIS — Z23 Encounter for immunization: Secondary | ICD-10-CM

## 2012-08-26 ENCOUNTER — Ambulatory Visit
Admission: RE | Admit: 2012-08-26 | Discharge: 2012-08-26 | Disposition: A | Discharge status: Home / Self Care | Payer: Medicare Other | Source: Ambulatory Visit | Attending: Radiation Oncology | Admitting: Radiation Oncology

## 2012-08-26 DIAGNOSIS — K602 Anal fissure, unspecified: Secondary | ICD-10-CM | POA: Diagnosis not present

## 2012-08-26 DIAGNOSIS — C61 Malignant neoplasm of prostate: Secondary | ICD-10-CM | POA: Diagnosis not present

## 2012-08-26 DIAGNOSIS — N342 Other urethritis: Secondary | ICD-10-CM | POA: Diagnosis not present

## 2012-08-26 DIAGNOSIS — Z510 Encounter for antineoplastic radiation therapy: Secondary | ICD-10-CM | POA: Diagnosis not present

## 2012-08-26 DIAGNOSIS — K6289 Other specified diseases of anus and rectum: Secondary | ICD-10-CM | POA: Diagnosis not present

## 2012-08-26 DIAGNOSIS — K5900 Constipation, unspecified: Secondary | ICD-10-CM | POA: Diagnosis not present

## 2012-08-27 ENCOUNTER — Ambulatory Visit: Payer: Medicare Other

## 2012-08-28 ENCOUNTER — Ambulatory Visit: Payer: Medicare Other

## 2012-08-31 ENCOUNTER — Ambulatory Visit
Admission: RE | Admit: 2012-08-31 | Discharge: 2012-08-31 | Disposition: A | Discharge status: Home / Self Care | Payer: Medicare Other | Source: Ambulatory Visit | Attending: Radiation Oncology | Admitting: Radiation Oncology

## 2012-08-31 ENCOUNTER — Encounter: Payer: Self-pay | Admitting: Radiation Oncology

## 2012-08-31 VITALS — BP 142/54 | HR 68 | Temp 98.6°F | Resp 20 | Wt 166.6 lb

## 2012-08-31 DIAGNOSIS — C61 Malignant neoplasm of prostate: Secondary | ICD-10-CM

## 2012-08-31 DIAGNOSIS — K6289 Other specified diseases of anus and rectum: Secondary | ICD-10-CM | POA: Diagnosis not present

## 2012-08-31 DIAGNOSIS — Z510 Encounter for antineoplastic radiation therapy: Secondary | ICD-10-CM | POA: Diagnosis not present

## 2012-08-31 DIAGNOSIS — N342 Other urethritis: Secondary | ICD-10-CM | POA: Diagnosis not present

## 2012-08-31 DIAGNOSIS — K602 Anal fissure, unspecified: Secondary | ICD-10-CM | POA: Diagnosis not present

## 2012-08-31 DIAGNOSIS — K5900 Constipation, unspecified: Secondary | ICD-10-CM | POA: Diagnosis not present

## 2012-08-31 NOTE — Progress Notes (Signed)
Weekly Management Note:  Site:Prostate bed Current Dose:  800  cGy Projected Dose: 6600  cGy  Narrative: The patient is seen today for routine under treatment assessment. CBCT/MVCT images/port films were reviewed. The chart was reviewed.   Bladder filling is excellent today. No GU or GI difficulties.  Physical Examination:  Filed Vitals:   08/31/12 1606  BP: 142/54  Pulse: 68  Temp: 98.6 F (37 C)  Resp: 20  .  Weight: 166 lb 9.6 oz (75.569 kg). No change.  Impression: Tolerating radiation therapy well.  Plan: Continue radiation therapy as planned.

## 2012-08-31 NOTE — Progress Notes (Signed)
Post simmed,radiatiion therapy and you book given to patient along with my buines card, printed schedule, patient has had 4/33 rad txs prostate No dysuria, but does have urgency,frequency, stated  he got nauseated after rad txs last week 2 days in a row, ,none today, alert,oriented x3 4:15 PM

## 2012-08-31 NOTE — Addendum Note (Signed)
Encounter addended by: Delynn Flavin, RN on: 08/31/2012  8:11 PM<BR>     Documentation filed: Charges VN

## 2012-09-01 ENCOUNTER — Ambulatory Visit
Admission: RE | Admit: 2012-09-01 | Discharge: 2012-09-01 | Disposition: A | Discharge status: Home / Self Care | Payer: Medicare Other | Source: Ambulatory Visit | Attending: Radiation Oncology | Admitting: Radiation Oncology

## 2012-09-01 DIAGNOSIS — Z7982 Long term (current) use of aspirin: Secondary | ICD-10-CM | POA: Diagnosis not present

## 2012-09-01 DIAGNOSIS — K649 Unspecified hemorrhoids: Secondary | ICD-10-CM | POA: Diagnosis not present

## 2012-09-01 DIAGNOSIS — K602 Anal fissure, unspecified: Secondary | ICD-10-CM | POA: Diagnosis not present

## 2012-09-01 DIAGNOSIS — C61 Malignant neoplasm of prostate: Secondary | ICD-10-CM | POA: Diagnosis not present

## 2012-09-01 DIAGNOSIS — I2510 Atherosclerotic heart disease of native coronary artery without angina pectoris: Secondary | ICD-10-CM | POA: Diagnosis not present

## 2012-09-01 DIAGNOSIS — I10 Essential (primary) hypertension: Secondary | ICD-10-CM | POA: Diagnosis not present

## 2012-09-01 DIAGNOSIS — K219 Gastro-esophageal reflux disease without esophagitis: Secondary | ICD-10-CM | POA: Diagnosis not present

## 2012-09-01 DIAGNOSIS — J3489 Other specified disorders of nose and nasal sinuses: Secondary | ICD-10-CM | POA: Diagnosis not present

## 2012-09-01 DIAGNOSIS — N342 Other urethritis: Secondary | ICD-10-CM | POA: Diagnosis not present

## 2012-09-01 DIAGNOSIS — Z79899 Other long term (current) drug therapy: Secondary | ICD-10-CM | POA: Diagnosis not present

## 2012-09-01 DIAGNOSIS — Z510 Encounter for antineoplastic radiation therapy: Secondary | ICD-10-CM | POA: Diagnosis not present

## 2012-09-01 DIAGNOSIS — E785 Hyperlipidemia, unspecified: Secondary | ICD-10-CM | POA: Diagnosis not present

## 2012-09-01 DIAGNOSIS — K5900 Constipation, unspecified: Secondary | ICD-10-CM | POA: Diagnosis not present

## 2012-09-01 DIAGNOSIS — Z87891 Personal history of nicotine dependence: Secondary | ICD-10-CM | POA: Diagnosis not present

## 2012-09-01 DIAGNOSIS — K6289 Other specified diseases of anus and rectum: Secondary | ICD-10-CM | POA: Diagnosis not present

## 2012-09-02 ENCOUNTER — Ambulatory Visit
Admission: RE | Admit: 2012-09-02 | Discharge: 2012-09-02 | Disposition: A | Discharge status: Home / Self Care | Payer: Medicare Other | Source: Ambulatory Visit | Attending: Radiation Oncology | Admitting: Radiation Oncology

## 2012-09-02 DIAGNOSIS — K602 Anal fissure, unspecified: Secondary | ICD-10-CM | POA: Diagnosis not present

## 2012-09-02 DIAGNOSIS — K6289 Other specified diseases of anus and rectum: Secondary | ICD-10-CM | POA: Diagnosis not present

## 2012-09-02 DIAGNOSIS — C61 Malignant neoplasm of prostate: Secondary | ICD-10-CM | POA: Diagnosis not present

## 2012-09-02 DIAGNOSIS — K5900 Constipation, unspecified: Secondary | ICD-10-CM | POA: Diagnosis not present

## 2012-09-02 DIAGNOSIS — Z510 Encounter for antineoplastic radiation therapy: Secondary | ICD-10-CM | POA: Diagnosis not present

## 2012-09-02 DIAGNOSIS — N342 Other urethritis: Secondary | ICD-10-CM | POA: Diagnosis not present

## 2012-09-03 ENCOUNTER — Ambulatory Visit
Admission: RE | Admit: 2012-09-03 | Discharge: 2012-09-03 | Disposition: A | Discharge status: Home / Self Care | Payer: Medicare Other | Source: Ambulatory Visit | Attending: Radiation Oncology | Admitting: Radiation Oncology

## 2012-09-03 DIAGNOSIS — C61 Malignant neoplasm of prostate: Secondary | ICD-10-CM | POA: Diagnosis not present

## 2012-09-03 DIAGNOSIS — N342 Other urethritis: Secondary | ICD-10-CM | POA: Diagnosis not present

## 2012-09-03 DIAGNOSIS — K602 Anal fissure, unspecified: Secondary | ICD-10-CM | POA: Diagnosis not present

## 2012-09-03 DIAGNOSIS — K5900 Constipation, unspecified: Secondary | ICD-10-CM | POA: Diagnosis not present

## 2012-09-03 DIAGNOSIS — K6289 Other specified diseases of anus and rectum: Secondary | ICD-10-CM | POA: Diagnosis not present

## 2012-09-03 DIAGNOSIS — Z510 Encounter for antineoplastic radiation therapy: Secondary | ICD-10-CM | POA: Diagnosis not present

## 2012-09-04 ENCOUNTER — Ambulatory Visit
Admission: RE | Admit: 2012-09-04 | Discharge: 2012-09-04 | Disposition: A | Discharge status: Home / Self Care | Payer: Medicare Other | Source: Ambulatory Visit | Attending: Radiation Oncology | Admitting: Radiation Oncology

## 2012-09-04 DIAGNOSIS — K6289 Other specified diseases of anus and rectum: Secondary | ICD-10-CM | POA: Diagnosis not present

## 2012-09-04 DIAGNOSIS — K602 Anal fissure, unspecified: Secondary | ICD-10-CM | POA: Diagnosis not present

## 2012-09-04 DIAGNOSIS — C61 Malignant neoplasm of prostate: Secondary | ICD-10-CM | POA: Diagnosis not present

## 2012-09-04 DIAGNOSIS — K5900 Constipation, unspecified: Secondary | ICD-10-CM | POA: Diagnosis not present

## 2012-09-04 DIAGNOSIS — Z510 Encounter for antineoplastic radiation therapy: Secondary | ICD-10-CM | POA: Diagnosis not present

## 2012-09-04 DIAGNOSIS — N342 Other urethritis: Secondary | ICD-10-CM | POA: Diagnosis not present

## 2012-09-07 ENCOUNTER — Encounter: Payer: Self-pay | Admitting: Radiation Oncology

## 2012-09-07 ENCOUNTER — Ambulatory Visit
Admission: RE | Admit: 2012-09-07 | Discharge: 2012-09-07 | Disposition: A | Discharge status: Home / Self Care | Payer: Medicare Other | Source: Ambulatory Visit | Attending: Radiation Oncology | Admitting: Radiation Oncology

## 2012-09-07 VITALS — BP 125/61 | HR 64 | Temp 97.5°F | Resp 20 | Wt 167.0 lb

## 2012-09-07 DIAGNOSIS — C61 Malignant neoplasm of prostate: Secondary | ICD-10-CM | POA: Diagnosis not present

## 2012-09-07 DIAGNOSIS — Z510 Encounter for antineoplastic radiation therapy: Secondary | ICD-10-CM | POA: Diagnosis not present

## 2012-09-07 DIAGNOSIS — K602 Anal fissure, unspecified: Secondary | ICD-10-CM | POA: Diagnosis not present

## 2012-09-07 DIAGNOSIS — K6289 Other specified diseases of anus and rectum: Secondary | ICD-10-CM | POA: Diagnosis not present

## 2012-09-07 DIAGNOSIS — K5900 Constipation, unspecified: Secondary | ICD-10-CM | POA: Diagnosis not present

## 2012-09-07 DIAGNOSIS — N342 Other urethritis: Secondary | ICD-10-CM | POA: Diagnosis not present

## 2012-09-07 NOTE — Progress Notes (Signed)
Weekly Management Note:  Site:Prostate bed Current Dose:  1800  cGy Projected Dose: 6600  cGy  Narrative: The patient is seen today for routine under treatment assessment. CBCT/MVCT images/port films were reviewed. The chart was reviewed.   His bladder filling is satisfactory. He does have loose of his bowels along with what he believes to be hemorrhoidal irritation. He has been using preparation H along with sitz baths. He does have a history of a rectal fissure.  Physical Examination:  Filed Vitals:   09/07/12 1533  BP: 125/61  Pulse: 64  Temp: 97.5 F (36.4 C)  Resp: 20  .  Weight: 167 lb (75.751 kg). No change.  Impression: Tolerating radiation therapy well.  Plan: Continue radiation therapy as planned.

## 2012-09-07 NOTE — Progress Notes (Signed)
Pt reports diarrhea beginning over weekend, and x 5 today, took Imodium 1 hr ago. Advised he follow directions on Imodium if diarrhea continues. He is unsure if diarrhea was caused by stool softeners or diet over weekend. Pt applying Prep H, Cortisone cream to rectal area for soreness. Advised he not apply before radiation tx. Pt states he has hx rectal fissure. Nocturia last night x 3, usually x 1. No other c/o verbalized.

## 2012-09-08 ENCOUNTER — Ambulatory Visit
Admission: RE | Admit: 2012-09-08 | Discharge: 2012-09-08 | Disposition: A | Discharge status: Home / Self Care | Payer: Medicare Other | Source: Ambulatory Visit | Attending: Radiation Oncology | Admitting: Radiation Oncology

## 2012-09-08 DIAGNOSIS — K6289 Other specified diseases of anus and rectum: Secondary | ICD-10-CM | POA: Diagnosis not present

## 2012-09-08 DIAGNOSIS — K602 Anal fissure, unspecified: Secondary | ICD-10-CM | POA: Diagnosis not present

## 2012-09-08 DIAGNOSIS — K5900 Constipation, unspecified: Secondary | ICD-10-CM | POA: Diagnosis not present

## 2012-09-08 DIAGNOSIS — C61 Malignant neoplasm of prostate: Secondary | ICD-10-CM | POA: Diagnosis not present

## 2012-09-08 DIAGNOSIS — Z510 Encounter for antineoplastic radiation therapy: Secondary | ICD-10-CM | POA: Diagnosis not present

## 2012-09-08 DIAGNOSIS — N342 Other urethritis: Secondary | ICD-10-CM | POA: Diagnosis not present

## 2012-09-09 ENCOUNTER — Ambulatory Visit
Admission: RE | Admit: 2012-09-09 | Discharge: 2012-09-09 | Disposition: A | Discharge status: Home / Self Care | Payer: Medicare Other | Source: Ambulatory Visit | Attending: Radiation Oncology | Admitting: Radiation Oncology

## 2012-09-09 DIAGNOSIS — C61 Malignant neoplasm of prostate: Secondary | ICD-10-CM | POA: Diagnosis not present

## 2012-09-09 DIAGNOSIS — K602 Anal fissure, unspecified: Secondary | ICD-10-CM | POA: Diagnosis not present

## 2012-09-09 DIAGNOSIS — K6289 Other specified diseases of anus and rectum: Secondary | ICD-10-CM | POA: Diagnosis not present

## 2012-09-09 DIAGNOSIS — N342 Other urethritis: Secondary | ICD-10-CM | POA: Diagnosis not present

## 2012-09-09 DIAGNOSIS — Z510 Encounter for antineoplastic radiation therapy: Secondary | ICD-10-CM | POA: Diagnosis not present

## 2012-09-09 DIAGNOSIS — K5900 Constipation, unspecified: Secondary | ICD-10-CM | POA: Diagnosis not present

## 2012-09-10 ENCOUNTER — Ambulatory Visit
Admission: RE | Admit: 2012-09-10 | Discharge: 2012-09-10 | Disposition: A | Discharge status: Home / Self Care | Payer: Medicare Other | Source: Ambulatory Visit | Attending: Radiation Oncology | Admitting: Radiation Oncology

## 2012-09-10 DIAGNOSIS — C61 Malignant neoplasm of prostate: Secondary | ICD-10-CM | POA: Diagnosis not present

## 2012-09-10 DIAGNOSIS — K6289 Other specified diseases of anus and rectum: Secondary | ICD-10-CM | POA: Diagnosis not present

## 2012-09-10 DIAGNOSIS — Z510 Encounter for antineoplastic radiation therapy: Secondary | ICD-10-CM | POA: Diagnosis not present

## 2012-09-10 DIAGNOSIS — K5900 Constipation, unspecified: Secondary | ICD-10-CM | POA: Diagnosis not present

## 2012-09-10 DIAGNOSIS — K602 Anal fissure, unspecified: Secondary | ICD-10-CM | POA: Diagnosis not present

## 2012-09-10 DIAGNOSIS — N342 Other urethritis: Secondary | ICD-10-CM | POA: Diagnosis not present

## 2012-09-11 ENCOUNTER — Ambulatory Visit
Admission: RE | Admit: 2012-09-11 | Discharge: 2012-09-11 | Disposition: A | Discharge status: Home / Self Care | Payer: Medicare Other | Source: Ambulatory Visit | Attending: Radiation Oncology | Admitting: Radiation Oncology

## 2012-09-11 DIAGNOSIS — K5900 Constipation, unspecified: Secondary | ICD-10-CM | POA: Diagnosis not present

## 2012-09-11 DIAGNOSIS — N342 Other urethritis: Secondary | ICD-10-CM | POA: Diagnosis not present

## 2012-09-11 DIAGNOSIS — K6289 Other specified diseases of anus and rectum: Secondary | ICD-10-CM | POA: Diagnosis not present

## 2012-09-11 DIAGNOSIS — Z510 Encounter for antineoplastic radiation therapy: Secondary | ICD-10-CM | POA: Diagnosis not present

## 2012-09-11 DIAGNOSIS — C61 Malignant neoplasm of prostate: Secondary | ICD-10-CM | POA: Diagnosis not present

## 2012-09-11 DIAGNOSIS — K602 Anal fissure, unspecified: Secondary | ICD-10-CM | POA: Diagnosis not present

## 2012-09-14 ENCOUNTER — Encounter: Payer: Self-pay | Admitting: Radiation Oncology

## 2012-09-14 ENCOUNTER — Ambulatory Visit
Admission: RE | Admit: 2012-09-14 | Discharge: 2012-09-14 | Disposition: A | Discharge status: Home / Self Care | Payer: Medicare Other | Source: Ambulatory Visit | Attending: Radiation Oncology | Admitting: Radiation Oncology

## 2012-09-14 VITALS — BP 134/64 | HR 64 | Temp 97.2°F | Resp 20 | Wt 168.2 lb

## 2012-09-14 DIAGNOSIS — C61 Malignant neoplasm of prostate: Secondary | ICD-10-CM | POA: Diagnosis not present

## 2012-09-14 DIAGNOSIS — K602 Anal fissure, unspecified: Secondary | ICD-10-CM | POA: Diagnosis not present

## 2012-09-14 DIAGNOSIS — N342 Other urethritis: Secondary | ICD-10-CM | POA: Diagnosis not present

## 2012-09-14 DIAGNOSIS — K5900 Constipation, unspecified: Secondary | ICD-10-CM | POA: Diagnosis not present

## 2012-09-14 DIAGNOSIS — Z510 Encounter for antineoplastic radiation therapy: Secondary | ICD-10-CM | POA: Diagnosis not present

## 2012-09-14 DIAGNOSIS — K6289 Other specified diseases of anus and rectum: Secondary | ICD-10-CM | POA: Diagnosis not present

## 2012-09-14 NOTE — Progress Notes (Signed)
Patient here weekly rad txs:13/33, says he is constipated, and hemorrhoids bothering him, using prep h, and sitz bath, took 2 colace last night ,didn't help but a small bm this am, when voiding feels uncomfortable, not all the time states patient 3:16 PM

## 2012-09-14 NOTE — Progress Notes (Signed)
Weekly Management Note:  Site: Prostate bed Current Dose:  2800  cGy Projected Dose: 6600  cGy  Narrative: The patient is seen today for routine under treatment assessment. CBCT/MVCT images/port films were reviewed. The chart was reviewed.   Bladder filling is satisfactory. He still bothered by hemorrhoidal discomfort along with constipation. He continues with sitz baths and Preparation H. He also states that he's had an earache since this morning. No history of fever. He does have seasonal allergies and has had some congestion for the past month.  Physical Examination:  Filed Vitals:   09/14/12 1512  BP: 134/64  Pulse: 64  Temp: 97.2 F (36.2 C)  Resp: 20  .  Weight: 168 lb 3.2 oz (76.295 kg). Right tympanic membrane is clear to visualization. No suggestion of serous otitis media.  Impression: Tolerating radiation therapy well. He see his primary care physician if his right ear pain persists.  Plan: Continue radiation therapy as planned.

## 2012-09-15 ENCOUNTER — Ambulatory Visit
Admission: RE | Admit: 2012-09-15 | Discharge: 2012-09-15 | Disposition: A | Discharge status: Home / Self Care | Payer: Medicare Other | Source: Ambulatory Visit | Attending: Radiation Oncology | Admitting: Radiation Oncology

## 2012-09-15 ENCOUNTER — Telehealth: Payer: Self-pay | Admitting: *Deleted

## 2012-09-15 DIAGNOSIS — N342 Other urethritis: Secondary | ICD-10-CM | POA: Diagnosis not present

## 2012-09-15 DIAGNOSIS — K602 Anal fissure, unspecified: Secondary | ICD-10-CM | POA: Diagnosis not present

## 2012-09-15 DIAGNOSIS — Z510 Encounter for antineoplastic radiation therapy: Secondary | ICD-10-CM | POA: Diagnosis not present

## 2012-09-15 DIAGNOSIS — K5900 Constipation, unspecified: Secondary | ICD-10-CM | POA: Diagnosis not present

## 2012-09-15 DIAGNOSIS — C61 Malignant neoplasm of prostate: Secondary | ICD-10-CM | POA: Diagnosis not present

## 2012-09-15 DIAGNOSIS — K6289 Other specified diseases of anus and rectum: Secondary | ICD-10-CM | POA: Diagnosis not present

## 2012-09-15 NOTE — Progress Notes (Signed)
Clinic progress note: The patient is seen today after his 15th treatment complaining of constipation. He took stool softeners yesterday and has not had a bowel movement except for a small liquid stool.[name] He does have hemorrhoidal irritation. His pretreatment MV CT scan today on Tomotherapy showed a large rectal stool. I suggested the patient that he take a fleets enema this evening and continue with his stool softeners along with drinking plenty of fluid.

## 2012-09-15 NOTE — Telephone Encounter (Signed)
error 

## 2012-09-16 ENCOUNTER — Other Ambulatory Visit: Payer: Self-pay | Admitting: Radiation Oncology

## 2012-09-16 ENCOUNTER — Ambulatory Visit
Admission: RE | Admit: 2012-09-16 | Discharge: 2012-09-16 | Disposition: A | Discharge status: Home / Self Care | Payer: Medicare Other | Source: Ambulatory Visit | Attending: Radiation Oncology | Admitting: Radiation Oncology

## 2012-09-16 DIAGNOSIS — K602 Anal fissure, unspecified: Secondary | ICD-10-CM | POA: Diagnosis not present

## 2012-09-16 DIAGNOSIS — K5900 Constipation, unspecified: Secondary | ICD-10-CM | POA: Diagnosis not present

## 2012-09-16 DIAGNOSIS — K6289 Other specified diseases of anus and rectum: Secondary | ICD-10-CM | POA: Diagnosis not present

## 2012-09-16 DIAGNOSIS — N342 Other urethritis: Secondary | ICD-10-CM | POA: Diagnosis not present

## 2012-09-16 DIAGNOSIS — K627 Radiation proctitis: Secondary | ICD-10-CM

## 2012-09-16 DIAGNOSIS — C61 Malignant neoplasm of prostate: Secondary | ICD-10-CM | POA: Diagnosis not present

## 2012-09-16 DIAGNOSIS — Z510 Encounter for antineoplastic radiation therapy: Secondary | ICD-10-CM | POA: Diagnosis not present

## 2012-09-16 MED ORDER — HYDROCORTISONE 2.5 % RE CREA: TOPICAL_CREAM | Freq: Two times a day (BID) | RECTAL | Status: DC

## 2012-09-16 NOTE — Progress Notes (Signed)
The patient called to tell me he was having worsening rectal discomfort. I called a prescription for hydrocortisone 2.5% rectal cream to use twice a day. I spoke with the patient and he will try to pick up his prescriptions night or early tomorrow. morning.

## 2012-09-17 ENCOUNTER — Ambulatory Visit
Admission: RE | Admit: 2012-09-17 | Discharge: 2012-09-17 | Disposition: A | Discharge status: Home / Self Care | Payer: Medicare Other | Source: Ambulatory Visit | Attending: Radiation Oncology | Admitting: Radiation Oncology

## 2012-09-17 DIAGNOSIS — K6289 Other specified diseases of anus and rectum: Secondary | ICD-10-CM | POA: Diagnosis not present

## 2012-09-17 DIAGNOSIS — K602 Anal fissure, unspecified: Secondary | ICD-10-CM | POA: Diagnosis not present

## 2012-09-17 DIAGNOSIS — N342 Other urethritis: Secondary | ICD-10-CM | POA: Diagnosis not present

## 2012-09-17 DIAGNOSIS — Z510 Encounter for antineoplastic radiation therapy: Secondary | ICD-10-CM | POA: Diagnosis not present

## 2012-09-17 DIAGNOSIS — K5900 Constipation, unspecified: Secondary | ICD-10-CM | POA: Diagnosis not present

## 2012-09-17 DIAGNOSIS — C61 Malignant neoplasm of prostate: Secondary | ICD-10-CM | POA: Diagnosis not present

## 2012-09-18 ENCOUNTER — Ambulatory Visit
Admission: RE | Admit: 2012-09-18 | Discharge: 2012-09-18 | Disposition: A | Discharge status: Home / Self Care | Payer: Medicare Other | Source: Ambulatory Visit | Attending: Radiation Oncology | Admitting: Radiation Oncology

## 2012-09-18 DIAGNOSIS — K602 Anal fissure, unspecified: Secondary | ICD-10-CM | POA: Diagnosis not present

## 2012-09-18 DIAGNOSIS — C61 Malignant neoplasm of prostate: Secondary | ICD-10-CM | POA: Diagnosis not present

## 2012-09-18 DIAGNOSIS — Z510 Encounter for antineoplastic radiation therapy: Secondary | ICD-10-CM | POA: Diagnosis not present

## 2012-09-18 DIAGNOSIS — K5900 Constipation, unspecified: Secondary | ICD-10-CM | POA: Diagnosis not present

## 2012-09-18 DIAGNOSIS — K6289 Other specified diseases of anus and rectum: Secondary | ICD-10-CM | POA: Diagnosis not present

## 2012-09-18 DIAGNOSIS — N342 Other urethritis: Secondary | ICD-10-CM | POA: Diagnosis not present

## 2012-09-21 ENCOUNTER — Ambulatory Visit
Admission: RE | Admit: 2012-09-21 | Discharge: 2012-09-21 | Disposition: A | Discharge status: Home / Self Care | Payer: Medicare Other | Source: Ambulatory Visit | Attending: Radiation Oncology | Admitting: Radiation Oncology

## 2012-09-21 ENCOUNTER — Encounter: Payer: Self-pay | Admitting: Radiation Oncology

## 2012-09-21 VITALS — BP 119/56 | HR 67 | Resp 16 | Wt 162.3 lb

## 2012-09-21 DIAGNOSIS — R300 Dysuria: Secondary | ICD-10-CM

## 2012-09-21 DIAGNOSIS — Z510 Encounter for antineoplastic radiation therapy: Secondary | ICD-10-CM | POA: Diagnosis not present

## 2012-09-21 DIAGNOSIS — K602 Anal fissure, unspecified: Secondary | ICD-10-CM | POA: Diagnosis not present

## 2012-09-21 DIAGNOSIS — C61 Malignant neoplasm of prostate: Secondary | ICD-10-CM

## 2012-09-21 DIAGNOSIS — K5900 Constipation, unspecified: Secondary | ICD-10-CM | POA: Diagnosis not present

## 2012-09-21 DIAGNOSIS — K6289 Other specified diseases of anus and rectum: Secondary | ICD-10-CM | POA: Diagnosis not present

## 2012-09-21 DIAGNOSIS — N342 Other urethritis: Secondary | ICD-10-CM | POA: Diagnosis not present

## 2012-09-21 LAB — URINALYSIS, MICROSCOPIC - CHCC
Bilirubin (Urine): NEGATIVE
Glucose: NEGATIVE g/dL
Ketones: NEGATIVE mg/dL
Leukocyte Esterase: NEGATIVE
Nitrite: NEGATIVE
Protein: NEGATIVE mg/dL
Specific Gravity, Urine: 1.01 (ref 1.003–1.035)
pH: 6 (ref 4.6–8.0)

## 2012-09-21 MED ORDER — PHENAZOPYRIDINE HCL 200 MG PO TABS: 200.0000 mg | ORAL_TABLET | Freq: Three times a day (TID) | ORAL | Status: DC | PRN

## 2012-09-21 NOTE — Progress Notes (Signed)
Patient presents to the clinic today unaccompanied for PUT with Dr. Dayton Scrape. Patient alert and oriented to person, place, and time. No distress noted. Steady gait noted. Pleasant affect noted. Patient reports intense burning pain with urination. Patient reports seeing blood in his urine. Patient reports that the Anusol prescribed by Dr. Dayton Scrape "made his hemrroids pop back in" but, now "his bottom is so sore he is afraid to eat because he doesn't want to have a bowel movement." Patient reports using colace to soften stool. Patient reports that he did not use the anusol again over the weekend because his hemrroids are "poped back in." patient reports he gets up on average twice per night to void which is less than 4-5 times the amount he was getting up prior to treatment. Patient reports that he had a small bowel movement this morning. Patient denies nausea, vomiting, headache, or dizziness. Reported all findings to Dr. Dayton Scrape.

## 2012-09-21 NOTE — Progress Notes (Signed)
Weekly Management Note:  Site: Prostate bed Current Dose:  3800  cGy Projected Dose: 6600  cGy  Narrative: The patient is seen today for routine under treatment assessment. CBCT/MVCT images/port films were reviewed. The chart was reviewed.   Bladder filling is suboptimal today. He has significant dysuria and also pain with bowel movements. No constipation. He feels that his rectal discomfort is somewhat improved with hydrocortisone cream. He does have a history of an anal fissure.  Physical Examination:  Filed Vitals:   09/21/12 1436  BP: 119/56  Pulse: 67  Resp: 16  .  Weight: 162 lb 4.8 oz (73.619 kg). Rectal examination marker for slight discomfort on palpation with no palpable evidence for thrombosed hemorrhoid or anal fissure.  Impression: He is having significant radiation urethritis and also proctitis. The degree of his symptoms are somewhat unusual for his dose of 3800 cGy. I feel he would benefit from a one-week rest to resume his treatment in one week. He'll continue with his hydrocortisone rectal cream, and I will start Pyridium 3 times a day when necessary. Also obtain a urine specimen for urinalysis and culture.  Plan: Continue radiation therapy as planned after a one-week break.

## 2012-09-22 ENCOUNTER — Ambulatory Visit: Payer: Medicare Other

## 2012-09-23 ENCOUNTER — Ambulatory Visit: Payer: Medicare Other

## 2012-09-23 LAB — URINE CULTURE

## 2012-09-24 ENCOUNTER — Ambulatory Visit: Payer: Medicare Other

## 2012-09-25 ENCOUNTER — Ambulatory Visit: Payer: Medicare Other

## 2012-09-28 ENCOUNTER — Ambulatory Visit: Payer: Medicare Other

## 2012-09-28 ENCOUNTER — Encounter: Payer: Self-pay | Admitting: Radiation Oncology

## 2012-09-28 ENCOUNTER — Ambulatory Visit: Admission: RE | Admit: 2012-09-28 | Payer: Medicare Other | Source: Ambulatory Visit

## 2012-09-28 ENCOUNTER — Ambulatory Visit
Admission: RE | Admit: 2012-09-28 | Discharge: 2012-09-28 | Disposition: A | Discharge status: Home / Self Care | Payer: Medicare Other | Source: Ambulatory Visit | Attending: Radiation Oncology | Admitting: Radiation Oncology

## 2012-09-28 VITALS — BP 151/79 | HR 75 | Temp 97.8°F | Resp 20 | Wt 165.0 lb

## 2012-09-28 DIAGNOSIS — C61 Malignant neoplasm of prostate: Secondary | ICD-10-CM

## 2012-09-28 NOTE — Progress Notes (Addendum)
Patient here  For radiation to prostate, has been on break since 09/21/12, still using hydrocortisone cream  For rectal discomfort, still has dysuria when he first starts to void, bowel movements hard to pass, takes colace(equate), taking 2 daily, says he might have to take a laxative with it, has so far completed 19/33 rad txs, has had a lot of gas, in abdomen, also nauseated today 4:43 PM

## 2012-09-28 NOTE — Progress Notes (Signed)
Clinic note: [name] is seen today after being rested for the past week because of proctitis. He continues with his sitz baths, rectal cortisone cream, and stool softeners. He still having discomfort on bowel movements and had rectal bleeding earlier today. His also having some dysuria on initiation of urination. His urinary frequency is improved.  He's not examined today  Impression: Some improvement in his proctitis, but I think we need to rest him one more week. I suggested to him that he takes MiraLax twice a day and continue with his sitz baths and cortisone rectal cream. He is to avoid constipation.  Plan: Resume radiation therapy one week.

## 2012-09-29 ENCOUNTER — Ambulatory Visit: Payer: Medicare Other

## 2012-09-30 ENCOUNTER — Ambulatory Visit: Payer: Medicare Other

## 2012-10-01 ENCOUNTER — Ambulatory Visit: Payer: Medicare Other

## 2012-10-02 ENCOUNTER — Ambulatory Visit: Payer: Medicare Other

## 2012-10-05 ENCOUNTER — Ambulatory Visit
Admission: RE | Admit: 2012-10-05 | Discharge: 2012-10-05 | Disposition: A | Discharge status: Home / Self Care | Payer: Medicare Other | Source: Ambulatory Visit

## 2012-10-05 ENCOUNTER — Ambulatory Visit
Admission: RE | Admit: 2012-10-05 | Discharge: 2012-10-05 | Disposition: A | Discharge status: Home / Self Care | Payer: Medicare Other | Source: Ambulatory Visit | Attending: Radiation Oncology | Admitting: Radiation Oncology

## 2012-10-05 ENCOUNTER — Ambulatory Visit: Payer: Medicare Other

## 2012-10-05 ENCOUNTER — Encounter: Payer: Self-pay | Admitting: Radiation Oncology

## 2012-10-05 VITALS — BP 145/65 | HR 75 | Temp 97.6°F | Resp 20 | Wt 164.3 lb

## 2012-10-05 DIAGNOSIS — K6289 Other specified diseases of anus and rectum: Secondary | ICD-10-CM | POA: Diagnosis not present

## 2012-10-05 DIAGNOSIS — Z510 Encounter for antineoplastic radiation therapy: Secondary | ICD-10-CM | POA: Diagnosis not present

## 2012-10-05 DIAGNOSIS — N342 Other urethritis: Secondary | ICD-10-CM | POA: Diagnosis not present

## 2012-10-05 DIAGNOSIS — C61 Malignant neoplasm of prostate: Secondary | ICD-10-CM

## 2012-10-05 DIAGNOSIS — K5900 Constipation, unspecified: Secondary | ICD-10-CM | POA: Diagnosis not present

## 2012-10-05 DIAGNOSIS — K602 Anal fissure, unspecified: Secondary | ICD-10-CM | POA: Diagnosis not present

## 2012-10-05 NOTE — Progress Notes (Signed)
Weekly Management Note:  Site: Prostate bed Current Dose:  4000  cGy Projected Dose: 6600  cGy  Narrative: The patient is seen today after a two-week rest for routine under treatment assessment. CBCT/MVCT images/port films were reviewed. The chart was reviewed.   Bladder filling is satisfactory. Rectal alignment is satisfactory. He is dysuria and proctitis are improved. He continues with his MiraLax. He is off his corticosteroid rectal cream and has stopped Pyridium. No rectal bleeding.  Physical Examination:  Filed Vitals:   10/05/12 1558  BP: 145/65  Pulse: 75  Temp: 97.6 F (36.4 C)  Resp: 20  .  Weight: 164 lb 4.8 oz (74.526 kg). No change.  Impression: Tolerating radiation therapy well with improvement of his urethritis and proctitis .  Plan: Continue radiation therapy as planned.

## 2012-10-05 NOTE — Progress Notes (Signed)
Patient here for weekly rad txs,20/33 txs completed, alert,oriented x3,  was on break, last used miralax Saturday night with 2 stool softners, ,having soft stools, some dysuria still stated patient, stopped miralax bid, to 1x day,hasn't used since Saturday though, appetite fair,eating small meals, drinking plenty fluids stated, no rectal bleeding 4:02 PM

## 2012-10-06 ENCOUNTER — Ambulatory Visit: Payer: Medicare Other

## 2012-10-06 ENCOUNTER — Ambulatory Visit
Admission: RE | Admit: 2012-10-06 | Discharge: 2012-10-06 | Disposition: A | Discharge status: Home / Self Care | Payer: Medicare Other | Source: Ambulatory Visit | Attending: Radiation Oncology | Admitting: Radiation Oncology

## 2012-10-06 DIAGNOSIS — K6289 Other specified diseases of anus and rectum: Secondary | ICD-10-CM | POA: Diagnosis not present

## 2012-10-06 DIAGNOSIS — Z510 Encounter for antineoplastic radiation therapy: Secondary | ICD-10-CM | POA: Diagnosis not present

## 2012-10-06 DIAGNOSIS — N342 Other urethritis: Secondary | ICD-10-CM | POA: Diagnosis not present

## 2012-10-06 DIAGNOSIS — K602 Anal fissure, unspecified: Secondary | ICD-10-CM | POA: Diagnosis not present

## 2012-10-06 DIAGNOSIS — K5900 Constipation, unspecified: Secondary | ICD-10-CM | POA: Diagnosis not present

## 2012-10-06 DIAGNOSIS — C61 Malignant neoplasm of prostate: Secondary | ICD-10-CM | POA: Diagnosis not present

## 2012-10-07 ENCOUNTER — Ambulatory Visit: Payer: Medicare Other

## 2012-10-07 ENCOUNTER — Ambulatory Visit
Admission: RE | Admit: 2012-10-07 | Discharge: 2012-10-07 | Disposition: A | Discharge status: Home / Self Care | Payer: Medicare Other | Source: Ambulatory Visit | Attending: Radiation Oncology | Admitting: Radiation Oncology

## 2012-10-07 DIAGNOSIS — N342 Other urethritis: Secondary | ICD-10-CM | POA: Diagnosis not present

## 2012-10-07 DIAGNOSIS — K5900 Constipation, unspecified: Secondary | ICD-10-CM | POA: Diagnosis not present

## 2012-10-07 DIAGNOSIS — Z510 Encounter for antineoplastic radiation therapy: Secondary | ICD-10-CM | POA: Diagnosis not present

## 2012-10-07 DIAGNOSIS — K6289 Other specified diseases of anus and rectum: Secondary | ICD-10-CM | POA: Diagnosis not present

## 2012-10-07 DIAGNOSIS — K602 Anal fissure, unspecified: Secondary | ICD-10-CM | POA: Diagnosis not present

## 2012-10-07 DIAGNOSIS — C61 Malignant neoplasm of prostate: Secondary | ICD-10-CM | POA: Diagnosis not present

## 2012-10-08 ENCOUNTER — Ambulatory Visit: Payer: Medicare Other

## 2012-10-08 ENCOUNTER — Ambulatory Visit
Admission: RE | Admit: 2012-10-08 | Discharge: 2012-10-08 | Disposition: A | Discharge status: Home / Self Care | Payer: Medicare Other | Source: Ambulatory Visit | Attending: Radiation Oncology | Admitting: Radiation Oncology

## 2012-10-08 DIAGNOSIS — K6289 Other specified diseases of anus and rectum: Secondary | ICD-10-CM | POA: Diagnosis not present

## 2012-10-08 DIAGNOSIS — K602 Anal fissure, unspecified: Secondary | ICD-10-CM | POA: Diagnosis not present

## 2012-10-08 DIAGNOSIS — N342 Other urethritis: Secondary | ICD-10-CM | POA: Diagnosis not present

## 2012-10-08 DIAGNOSIS — K5900 Constipation, unspecified: Secondary | ICD-10-CM | POA: Diagnosis not present

## 2012-10-08 DIAGNOSIS — C61 Malignant neoplasm of prostate: Secondary | ICD-10-CM | POA: Diagnosis not present

## 2012-10-08 DIAGNOSIS — Z510 Encounter for antineoplastic radiation therapy: Secondary | ICD-10-CM | POA: Diagnosis not present

## 2012-10-09 ENCOUNTER — Ambulatory Visit
Admission: RE | Admit: 2012-10-09 | Discharge: 2012-10-09 | Disposition: A | Discharge status: Home / Self Care | Payer: Medicare Other | Source: Ambulatory Visit | Attending: Radiation Oncology | Admitting: Radiation Oncology

## 2012-10-09 ENCOUNTER — Ambulatory Visit: Payer: Medicare Other

## 2012-10-09 DIAGNOSIS — N342 Other urethritis: Secondary | ICD-10-CM | POA: Diagnosis not present

## 2012-10-09 DIAGNOSIS — K6289 Other specified diseases of anus and rectum: Secondary | ICD-10-CM | POA: Diagnosis not present

## 2012-10-09 DIAGNOSIS — K602 Anal fissure, unspecified: Secondary | ICD-10-CM | POA: Diagnosis not present

## 2012-10-09 DIAGNOSIS — Z510 Encounter for antineoplastic radiation therapy: Secondary | ICD-10-CM | POA: Diagnosis not present

## 2012-10-09 DIAGNOSIS — C61 Malignant neoplasm of prostate: Secondary | ICD-10-CM | POA: Diagnosis not present

## 2012-10-09 DIAGNOSIS — K5900 Constipation, unspecified: Secondary | ICD-10-CM | POA: Diagnosis not present

## 2012-10-12 ENCOUNTER — Ambulatory Visit
Admission: RE | Admit: 2012-10-12 | Discharge: 2012-10-12 | Disposition: A | Discharge status: Home / Self Care | Payer: Medicare Other | Source: Ambulatory Visit | Attending: Radiation Oncology | Admitting: Radiation Oncology

## 2012-10-12 ENCOUNTER — Ambulatory Visit: Payer: Medicare Other

## 2012-10-12 ENCOUNTER — Encounter: Payer: Self-pay | Admitting: Radiation Oncology

## 2012-10-12 VITALS — BP 114/58 | HR 65 | Resp 16 | Wt 164.0 lb

## 2012-10-12 DIAGNOSIS — K6289 Other specified diseases of anus and rectum: Secondary | ICD-10-CM | POA: Diagnosis not present

## 2012-10-12 DIAGNOSIS — K5900 Constipation, unspecified: Secondary | ICD-10-CM | POA: Diagnosis not present

## 2012-10-12 DIAGNOSIS — N342 Other urethritis: Secondary | ICD-10-CM | POA: Diagnosis not present

## 2012-10-12 DIAGNOSIS — C61 Malignant neoplasm of prostate: Secondary | ICD-10-CM

## 2012-10-12 DIAGNOSIS — K602 Anal fissure, unspecified: Secondary | ICD-10-CM | POA: Diagnosis not present

## 2012-10-12 DIAGNOSIS — Z510 Encounter for antineoplastic radiation therapy: Secondary | ICD-10-CM | POA: Diagnosis not present

## 2012-10-12 NOTE — Progress Notes (Signed)
Patient presents to the clinic today unaccompanied for PUT with Dr. Dayton Scrape. Patient alert and oriented to person, place, and time. No distress noted. Steady gait noted. Pleasant affect noted. Patient denies pain at this time. Patient reports burning with urination at the beginning of void. Patient denies diarrhea. Patient denies burning/pain with bowel movement. Patient denies hematuria. Patient reports an average urine flow not strong or weak. Patient reports that on average he gets up 2-3 times per night to void. Patient concerned about dizziness and low bp. BP today normal. Encouraged patient to monitor and record daily blood pressure and contact PCP if issue did not resolve. Patient verbalized understanding. Reported all findings to Dr. Dayton Scrape.

## 2012-10-12 NOTE — Progress Notes (Signed)
Weekly Management Note:  Site: Prostate bed Current Dose:  5000 cGy Projected Dose: 6600  cGy  Narrative: The patient is seen today for routine under treatment assessment. CBCT/MVCT images/port films were reviewed. The chart was reviewed.   Bladder filling is suboptimal today. He does feel that his bladder is full. This was discussed with the patient. He does have some dysuria on initiation of urination. No rectal discomfort at this time. He had some dizziness over the weekend and his blood pressure has been running low. He decreased his hypertension medication, Benicar, the 20 mg instead of 40 mg a day .  Physical Examination:  Filed Vitals:   10/12/12 1249  BP: 114/58  Pulse: 65  Resp: 16  .  Weight: 164 lb (74.39 kg). No change.  Impression: Tolerating radiation therapy well. He is having less proctitis. He is to contact Dr. Doristine Counter, his primary caretaker to adjust his blood pressure medication. We also discussed ways to improve his bladder filling.  Plan: Continue radiation therapy as planned.

## 2012-10-13 ENCOUNTER — Ambulatory Visit (INDEPENDENT_AMBULATORY_CARE_PROVIDER_SITE_OTHER): Payer: Medicare Other | Admitting: Family Medicine

## 2012-10-13 ENCOUNTER — Encounter: Payer: Self-pay | Admitting: Family Medicine

## 2012-10-13 ENCOUNTER — Ambulatory Visit
Admission: RE | Admit: 2012-10-13 | Discharge: 2012-10-13 | Disposition: A | Discharge status: Home / Self Care | Payer: Medicare Other | Source: Ambulatory Visit | Attending: Radiation Oncology | Admitting: Radiation Oncology

## 2012-10-13 ENCOUNTER — Ambulatory Visit: Payer: Medicare Other

## 2012-10-13 VITALS — BP 110/64 | Temp 97.4°F | Wt 160.0 lb

## 2012-10-13 DIAGNOSIS — Z510 Encounter for antineoplastic radiation therapy: Secondary | ICD-10-CM | POA: Diagnosis not present

## 2012-10-13 DIAGNOSIS — C61 Malignant neoplasm of prostate: Secondary | ICD-10-CM | POA: Diagnosis not present

## 2012-10-13 DIAGNOSIS — R634 Abnormal weight loss: Secondary | ICD-10-CM | POA: Diagnosis not present

## 2012-10-13 DIAGNOSIS — I959 Hypotension, unspecified: Secondary | ICD-10-CM | POA: Diagnosis not present

## 2012-10-13 DIAGNOSIS — K6289 Other specified diseases of anus and rectum: Secondary | ICD-10-CM | POA: Diagnosis not present

## 2012-10-13 DIAGNOSIS — K5900 Constipation, unspecified: Secondary | ICD-10-CM | POA: Diagnosis not present

## 2012-10-13 DIAGNOSIS — N342 Other urethritis: Secondary | ICD-10-CM | POA: Diagnosis not present

## 2012-10-13 DIAGNOSIS — K602 Anal fissure, unspecified: Secondary | ICD-10-CM | POA: Diagnosis not present

## 2012-10-13 LAB — BASIC METABOLIC PANEL
BUN: 21 mg/dL (ref 6–23)
CO2: 24 mEq/L (ref 19–32)
Calcium: 9.7 mg/dL (ref 8.4–10.5)
Chloride: 103 mEq/L (ref 96–112)
Creatinine, Ser: 1.6 mg/dL — ABNORMAL HIGH (ref 0.4–1.5)
GFR: 44.73 mL/min — ABNORMAL LOW (ref >60.00)
Glucose, Bld: 93 mg/dL (ref 70–99)
Potassium: 5.4 mEq/L — ABNORMAL HIGH (ref 3.5–5.1)
Sodium: 135 mEq/L (ref 135–145)

## 2012-10-13 LAB — CBC
HCT: 36.4 % — ABNORMAL LOW (ref 39.0–52.0)
Hemoglobin: 12.1 g/dL — ABNORMAL LOW (ref 13.0–17.0)
MCHC: 33.3 g/dL (ref 30.0–36.0)
MCV: 104.7 fl — ABNORMAL HIGH (ref 78.0–100.0)
Platelets: 177 10*3/uL (ref 150.0–400.0)
RBC: 3.48 Mil/uL — ABNORMAL LOW (ref 4.22–5.81)
RDW: 13.2 % (ref 11.5–14.6)
WBC: 5 10*3/uL (ref 4.5–10.5)

## 2012-10-13 NOTE — Patient Instructions (Addendum)
Stop taking Benicar for the time being, but continue monitoring your blood pressure at home.  Follow up again with Dr. Caryl Never in 2 weeks. Stay well-hydrated by drinking 7-8 glasses of water daily.

## 2012-10-13 NOTE — Progress Notes (Signed)
Subjective:     Patient ID: [name], male   DOB: 1936/08/08, 76 y.o.   MRN: [id number]  HPI 76 year old with history of hypertension, hyperlipidemia, and adenocarcinoma of the prostate currently undergoing radiation therapy here for evaluation of several days of low BP.  Pt checks BPs on his own at home, and over the last 10 days has had many readings in the 90/50 or lower range, but notes symptoms of dizziness especially in the morning only over the last 3 days.  He cut his Benicar dose from 40mg  QD to 20mg  QD 3 days ago, but has still been having low range BPs.  Recently started (~20 days ago) phenazopyridine 200mg  TID for dysuria he has been experiencing as s/e of radiation, but otherwise no other medication changes.  This AM, he recorded 2 BPs in the 80s/50s range before arrival in clinic.  He takes Miralax qHS for prophylaxis against constipation due to radiation therapy, and has a regular stool each morning, but denies diarrhea or frequent voluminous stools.  Diet includes chicken, boiled potatoes, toast, applesauce, oatmeal, pretzels, avoids much salt.  States that he drinks "7-8 cups" of water per day.  Energy level has been low and has not been sleeping well since radiation started, stating that he has a lot of things on his mind.     Review of Systems  Constitutional: Positive for appetite change (decreased appetite) and fatigue.  Respiratory: Negative for shortness of breath.   Cardiovascular: Negative for chest pain.  Gastrointestinal: Positive for nausea (from radition treatment).  Neurological: Positive for dizziness (especially in the morning, but later in the day as well) and light-headedness.       Objective:   Physical Exam  Constitutional: He is oriented to person, place, and time. He appears well-developed and well-nourished. No distress.       Weight loss of 10 lbs noted since July.  HENT:  Head: Normocephalic and atraumatic.  Cardiovascular: Normal rate, regular  rhythm, normal heart sounds and intact distal pulses.        Fingernail beds without pallor.  Warm and with good capillary refill. BP re-check L arm, pt sitting up on exam table 92/64.  Pulmonary/Chest: Effort normal and breath sounds normal. No respiratory distress.  Neurological: He is alert and oriented to person, place, and time.  Psychiatric:       Denies depressed mood. Affect somewhat blunted.       Assessment:     76 year old with history of hypertension, hyperlipidemia, and prostate adenocarcinoma currently undergoing radiation here for evaluation of several days of low BPs with dizziness.    Plan:     1. Low BP: likely combination of weight loss since July, decreased appetite and PO intake due to radiation, and effect of Benicar.  Frequency of low BPs enough to warrant discontinuing Benicar altogether at this time.  Encourage pt to continue monitoring BPs at home, ensuring proper hydration with 7-8 glasses of water daily.  Check CBC and BMP today to assess for any anemia or electrolyte disturbances.  Follow up in 2 weeks.  Marthann Schiller, MS3  Agree with assessment and plan as per Marthann Schiller, MS 3 Clinically, doubt significant anemia but check CBC.  Hold Benicar as above and increase hydration. Evelena Peat MD

## 2012-10-14 ENCOUNTER — Ambulatory Visit
Admission: RE | Admit: 2012-10-14 | Discharge: 2012-10-14 | Disposition: A | Discharge status: Home / Self Care | Payer: Medicare Other | Source: Ambulatory Visit | Attending: Radiation Oncology | Admitting: Radiation Oncology

## 2012-10-14 ENCOUNTER — Ambulatory Visit: Payer: Medicare Other

## 2012-10-14 DIAGNOSIS — K6289 Other specified diseases of anus and rectum: Secondary | ICD-10-CM | POA: Diagnosis not present

## 2012-10-14 DIAGNOSIS — K5900 Constipation, unspecified: Secondary | ICD-10-CM | POA: Diagnosis not present

## 2012-10-14 DIAGNOSIS — C61 Malignant neoplasm of prostate: Secondary | ICD-10-CM | POA: Diagnosis not present

## 2012-10-14 DIAGNOSIS — K602 Anal fissure, unspecified: Secondary | ICD-10-CM | POA: Diagnosis not present

## 2012-10-14 DIAGNOSIS — Z510 Encounter for antineoplastic radiation therapy: Secondary | ICD-10-CM | POA: Diagnosis not present

## 2012-10-14 DIAGNOSIS — N342 Other urethritis: Secondary | ICD-10-CM | POA: Diagnosis not present

## 2012-10-14 NOTE — Progress Notes (Signed)
Quick Note:  Pt informed ______ 

## 2012-10-15 ENCOUNTER — Ambulatory Visit: Payer: Medicare Other

## 2012-10-15 ENCOUNTER — Ambulatory Visit
Admission: RE | Admit: 2012-10-15 | Discharge: 2012-10-15 | Disposition: A | Discharge status: Home / Self Care | Payer: Medicare Other | Source: Ambulatory Visit | Attending: Radiation Oncology | Admitting: Radiation Oncology

## 2012-10-15 DIAGNOSIS — Z510 Encounter for antineoplastic radiation therapy: Secondary | ICD-10-CM | POA: Diagnosis not present

## 2012-10-15 DIAGNOSIS — K602 Anal fissure, unspecified: Secondary | ICD-10-CM | POA: Diagnosis not present

## 2012-10-15 DIAGNOSIS — C61 Malignant neoplasm of prostate: Secondary | ICD-10-CM | POA: Diagnosis not present

## 2012-10-15 DIAGNOSIS — K5900 Constipation, unspecified: Secondary | ICD-10-CM | POA: Diagnosis not present

## 2012-10-15 DIAGNOSIS — K6289 Other specified diseases of anus and rectum: Secondary | ICD-10-CM | POA: Diagnosis not present

## 2012-10-15 DIAGNOSIS — N342 Other urethritis: Secondary | ICD-10-CM | POA: Diagnosis not present

## 2012-10-16 ENCOUNTER — Ambulatory Visit
Admission: RE | Admit: 2012-10-16 | Discharge: 2012-10-16 | Disposition: A | Discharge status: Home / Self Care | Payer: Medicare Other | Source: Ambulatory Visit | Attending: Radiation Oncology | Admitting: Radiation Oncology

## 2012-10-16 DIAGNOSIS — Z510 Encounter for antineoplastic radiation therapy: Secondary | ICD-10-CM | POA: Diagnosis not present

## 2012-10-16 DIAGNOSIS — K602 Anal fissure, unspecified: Secondary | ICD-10-CM | POA: Diagnosis not present

## 2012-10-16 DIAGNOSIS — K6289 Other specified diseases of anus and rectum: Secondary | ICD-10-CM | POA: Diagnosis not present

## 2012-10-16 DIAGNOSIS — K5900 Constipation, unspecified: Secondary | ICD-10-CM | POA: Diagnosis not present

## 2012-10-16 DIAGNOSIS — N342 Other urethritis: Secondary | ICD-10-CM | POA: Diagnosis not present

## 2012-10-16 DIAGNOSIS — C61 Malignant neoplasm of prostate: Secondary | ICD-10-CM | POA: Diagnosis not present

## 2012-10-19 ENCOUNTER — Ambulatory Visit
Admission: RE | Admit: 2012-10-19 | Discharge: 2012-10-19 | Disposition: A | Discharge status: Home / Self Care | Payer: Medicare Other | Source: Ambulatory Visit | Attending: Radiation Oncology | Admitting: Radiation Oncology

## 2012-10-19 ENCOUNTER — Encounter: Payer: Self-pay | Admitting: Radiation Oncology

## 2012-10-19 VITALS — BP 144/66 | HR 78 | Temp 97.4°F | Resp 20 | Wt 161.0 lb

## 2012-10-19 DIAGNOSIS — N342 Other urethritis: Secondary | ICD-10-CM | POA: Diagnosis not present

## 2012-10-19 DIAGNOSIS — Z510 Encounter for antineoplastic radiation therapy: Secondary | ICD-10-CM | POA: Diagnosis not present

## 2012-10-19 DIAGNOSIS — K6289 Other specified diseases of anus and rectum: Secondary | ICD-10-CM | POA: Diagnosis not present

## 2012-10-19 DIAGNOSIS — C61 Malignant neoplasm of prostate: Secondary | ICD-10-CM | POA: Diagnosis not present

## 2012-10-19 DIAGNOSIS — K602 Anal fissure, unspecified: Secondary | ICD-10-CM | POA: Diagnosis not present

## 2012-10-19 DIAGNOSIS — K5900 Constipation, unspecified: Secondary | ICD-10-CM | POA: Diagnosis not present

## 2012-10-19 NOTE — Progress Notes (Signed)
Weekly Management Note:  Site: Prostate bed Current Dose:  6000  cGy Projected Dose: 6600  cGy  Narrative: The patient is seen today for routine under treatment assessment. CBCT/MVCT images/port films were reviewed. The chart was reviewed.   Bladder filling is satisfactory today. He continues to have urinary frequency, urgency and dysuria which is improved with Pyridium. He continues with MiraLax to prevent constipation. Rectal bleeding has tapered off and is having less rectal discomfort.  Physical Examination:  Filed Vitals:   10/19/12 1257  BP: 144/66  Pulse: 78  Temp: 97.4 F (36.3 C)  Resp: 20  .  Weight: 161 lb (73.029 kg). No change.  Impression: Tolerating radiation therapy well.  Plan: Continue radiation therapy as planned. He'll finish his treatment this Thursday.

## 2012-10-19 NOTE — Progress Notes (Signed)
Pt c/o ongoing dysuria even w/Pyridium 2-3 x day. Pt c/o urinary urgency, nocturia x 3-4. Denies pain otherwise, denies daytime urinary frequency. Taking Miralax for bowels.

## 2012-10-20 ENCOUNTER — Ambulatory Visit
Admission: RE | Admit: 2012-10-20 | Discharge: 2012-10-20 | Disposition: A | Discharge status: Home / Self Care | Payer: Medicare Other | Source: Ambulatory Visit | Attending: Radiation Oncology | Admitting: Radiation Oncology

## 2012-10-20 DIAGNOSIS — K602 Anal fissure, unspecified: Secondary | ICD-10-CM | POA: Diagnosis not present

## 2012-10-20 DIAGNOSIS — Z510 Encounter for antineoplastic radiation therapy: Secondary | ICD-10-CM | POA: Diagnosis not present

## 2012-10-20 DIAGNOSIS — N342 Other urethritis: Secondary | ICD-10-CM | POA: Diagnosis not present

## 2012-10-20 DIAGNOSIS — C61 Malignant neoplasm of prostate: Secondary | ICD-10-CM | POA: Diagnosis not present

## 2012-10-20 DIAGNOSIS — K6289 Other specified diseases of anus and rectum: Secondary | ICD-10-CM | POA: Diagnosis not present

## 2012-10-20 DIAGNOSIS — K5900 Constipation, unspecified: Secondary | ICD-10-CM | POA: Diagnosis not present

## 2012-10-21 ENCOUNTER — Ambulatory Visit
Admission: RE | Admit: 2012-10-21 | Discharge: 2012-10-21 | Disposition: A | Discharge status: Home / Self Care | Payer: Medicare Other | Source: Ambulatory Visit | Attending: Radiation Oncology | Admitting: Radiation Oncology

## 2012-10-21 DIAGNOSIS — C61 Malignant neoplasm of prostate: Secondary | ICD-10-CM | POA: Diagnosis not present

## 2012-10-21 DIAGNOSIS — K5900 Constipation, unspecified: Secondary | ICD-10-CM | POA: Diagnosis not present

## 2012-10-21 DIAGNOSIS — Z510 Encounter for antineoplastic radiation therapy: Secondary | ICD-10-CM | POA: Diagnosis not present

## 2012-10-21 DIAGNOSIS — K602 Anal fissure, unspecified: Secondary | ICD-10-CM | POA: Diagnosis not present

## 2012-10-21 DIAGNOSIS — N342 Other urethritis: Secondary | ICD-10-CM | POA: Diagnosis not present

## 2012-10-21 DIAGNOSIS — K6289 Other specified diseases of anus and rectum: Secondary | ICD-10-CM | POA: Diagnosis not present

## 2012-10-22 ENCOUNTER — Ambulatory Visit: Admission: RE | Admit: 2012-10-22 | Payer: Medicare Other | Source: Ambulatory Visit | Admitting: Radiation Oncology

## 2012-10-22 ENCOUNTER — Encounter: Payer: Self-pay | Admitting: Radiation Oncology

## 2012-10-22 ENCOUNTER — Ambulatory Visit
Admission: RE | Admit: 2012-10-22 | Discharge: 2012-10-22 | Disposition: A | Discharge status: Home / Self Care | Payer: Medicare Other | Source: Ambulatory Visit | Attending: Radiation Oncology | Admitting: Radiation Oncology

## 2012-10-22 ENCOUNTER — Ambulatory Visit
Admission: RE | Admit: 2012-10-22 | Discharge: 2012-10-22 | Disposition: A | Discharge status: Home / Self Care | Payer: Medicare Other | Source: Ambulatory Visit | Admitting: Radiation Oncology

## 2012-10-22 VITALS — BP 117/66 | HR 76 | Temp 97.6°F | Wt 159.8 lb

## 2012-10-22 DIAGNOSIS — Z510 Encounter for antineoplastic radiation therapy: Secondary | ICD-10-CM | POA: Diagnosis not present

## 2012-10-22 DIAGNOSIS — C61 Malignant neoplasm of prostate: Secondary | ICD-10-CM | POA: Diagnosis not present

## 2012-10-22 DIAGNOSIS — K5900 Constipation, unspecified: Secondary | ICD-10-CM | POA: Diagnosis not present

## 2012-10-22 DIAGNOSIS — K602 Anal fissure, unspecified: Secondary | ICD-10-CM | POA: Diagnosis not present

## 2012-10-22 DIAGNOSIS — K6289 Other specified diseases of anus and rectum: Secondary | ICD-10-CM | POA: Diagnosis not present

## 2012-10-22 DIAGNOSIS — N342 Other urethritis: Secondary | ICD-10-CM | POA: Diagnosis not present

## 2012-10-22 NOTE — Progress Notes (Signed)
[facility] Radiation Oncology End of Treatment Note  Name:[name]  Date: 10/22/2012 ZOX:[id number] DOB:25-Dec-1935   Status:outpatient    CC: Kristian Covey, MD  Dr. Jerlyn Ly  REFERRING PHYSICIAN:  Dr. Jerlyn Ly   DIAGNOSIS: PSA recurrent carcinoma the prostate   INDICATION FOR TREATMENT: Curative   TREATMENT DATES: 08/24/2012 through 10/22/2012                          SITE/DOSE: Prostate bed 6600 cGy 33 sessions                           BEAMS/ENERGY:   6 MV photons helical IMRT Tomotherapy with daily image guidance with MV CT                NARRATIVE:   Mr. Guernsey a difficult time with proctitis with rectal bleeding midway through his course of therapy. He had a history of having an anal fissure although this was not appreciated on digital examination. He did not really respond to rectal corticosteroids and sitz baths, and thus she was placed on a treatment rest for almost 2 weeks. He did resumed his treatment without significant GU or GI toxicity.                       PLAN: Routine followup in one month. Patient instructed to call if questions or worsening complaints in interim.

## 2012-10-22 NOTE — Progress Notes (Signed)
Completes treatment today. Reports intermittent burning upon urination, but denies any rectal irritation.  Stools loose due to use of of Miralax , but not watery.  No signs of rectal bleeding.   Aso reports that he is a "little bit" tired.

## 2012-10-22 NOTE — Progress Notes (Signed)
Weekly Management Note:  Site: Prostate Current Dose:  6600  cGy Projected Dose: 6600  cGy  Narrative: The patient is seen today for routine under treatment assessment. CBCT/MVCT images/port films were reviewed. The chart was reviewed.   Bladder filling has been satisfactory. No new GU or GI difficulties. He does have dysuria, intermittently. No significant rectal discomfort. Stools remain loose on MiraLax.  Physical Examination:  Filed Vitals:   10/22/12 1240  BP: 117/66  Pulse: 76  Temp: 97.6 F (36.4 C)  .  Weight: 159 lb 12.8 oz (72.485 kg). No change. Rectal examination not performed.  Impression: Tolerating radiation therapy well. Radiation therapy completed today  Plan: Followup visit one month.

## 2012-10-28 ENCOUNTER — Ambulatory Visit (INDEPENDENT_AMBULATORY_CARE_PROVIDER_SITE_OTHER): Payer: Medicare Other | Admitting: Family Medicine

## 2012-10-28 ENCOUNTER — Encounter: Payer: Self-pay | Admitting: Family Medicine

## 2012-10-28 VITALS — BP 130/70 | Temp 97.8°F | Wt 161.0 lb

## 2012-10-28 DIAGNOSIS — R42 Dizziness and giddiness: Secondary | ICD-10-CM | POA: Diagnosis not present

## 2012-10-28 DIAGNOSIS — R5383 Other fatigue: Secondary | ICD-10-CM

## 2012-10-28 DIAGNOSIS — K644 Residual hemorrhoidal skin tags: Secondary | ICD-10-CM

## 2012-10-28 DIAGNOSIS — E875 Hyperkalemia: Secondary | ICD-10-CM

## 2012-10-28 DIAGNOSIS — D539 Nutritional anemia, unspecified: Secondary | ICD-10-CM

## 2012-10-28 DIAGNOSIS — I10 Essential (primary) hypertension: Secondary | ICD-10-CM

## 2012-10-28 DIAGNOSIS — R5381 Other malaise: Secondary | ICD-10-CM

## 2012-10-28 LAB — BASIC METABOLIC PANEL
BUN: 16 mg/dL (ref 6–23)
CO2: 26 mEq/L (ref 19–32)
Calcium: 9.6 mg/dL (ref 8.4–10.5)
Chloride: 106 mEq/L (ref 96–112)
Creatinine, Ser: 1.6 mg/dL — ABNORMAL HIGH (ref 0.4–1.5)
GFR: 46.38 mL/min — ABNORMAL LOW (ref >60.00)
Glucose, Bld: 114 mg/dL — ABNORMAL HIGH (ref 70–99)
Potassium: 5 mEq/L (ref 3.5–5.1)
Sodium: 138 mEq/L (ref 135–145)

## 2012-10-28 LAB — TSH: TSH: 0.93 u[IU]/mL (ref 0.35–5.50)

## 2012-10-28 LAB — VITAMIN B12: Vitamin B-12: 176 pg/mL — ABNORMAL LOW (ref 211–911)

## 2012-10-28 NOTE — Progress Notes (Signed)
Subjective:    Patient ID: [name], male    DOB: 1935/12/03, 76 y.o.   MRN: [id number]  HPI  Patient seen for the following issues:  Recently presented with fatigue and some orthostatic type dizziness. His blood pressure was on the low side and we held his Benicar altogether. He overall feels much better. No dizziness and fatigue is somewhat less. He's been undergoing radiation therapy for prostate cancer. Appetite is fair.  Patient relates some loose stools recently. He's been taking MiraLax the past couple of weeks. He initially had some straining and constipation. Past couple days had some bright red blood per rectum with mild discomfort. Using some type of medicated cream, presumably Anusol. He is due for repeat colonoscopy soon.  Recent labs revealed mild anemia with elevated MCV over 104. We had recommended he return for labs including B12 and thyroid function to evaluate his macrocytic anemia. He does not consume alcohol regularly. Also had recent potassium 5.4 and does not take any potassium supplement. We suspected this was hemolyzed.  Past Medical History  Diagnosis Date  . HYPERLIPIDEMIA 04/06/2010  . BENIGN POSITIONAL VERTIGO 04/06/2010  . HYPERTENSION 04/06/2010  . CAD 04/06/2010  . GERD 04/06/2010  . SKIN LESION 04/06/2010  . Acute sinusitis, unspecified 11/27/2010  . Coronary artery disease 2006    angioplasty  . Prostate cancer 09/06/05 dx    adenocarcinoma gleason=3+4=7,pSA=<0.01-0.13-212  . Sleep apnea   . Anal fissure   . Allergy     bee sting,,amoxicillin, hydrocodone  . Joint pain   . OSTEOARTHRITIS, GENERALIZED 04/06/2010   Past Surgical History  Procedure Date  . Angioplasty 1991  . Esophagas dilatation 1992  . Hernia repair 1994    b/l groin area  . Prostate surgery 11/18/05    prostatectomy, adenocarcinoma radical  . Back surgery   . Knee surgery     right arthoscopic    reports that he quit smoking about 17 years ago. His smoking use included  Cigarettes. He has a 20 pack-year smoking history. He does not have any smokeless tobacco history on file. He reports that he drinks alcohol. He reports that he does not use illicit drugs. family history includes Arthritis in his other; Cancer in his brother and father; Hyperlipidemia in his other; and Hypertension in his other. Allergies  Allergen Reactions  . Hydrocodone     REACTION: disorientation  . Amoxicillin Rash      Review of Systems  Constitutional: Positive for fatigue. Negative for fever, chills and appetite change.  Respiratory: Negative for cough and shortness of breath.   Cardiovascular: Negative for chest pain.  Gastrointestinal: Negative for abdominal pain.  Genitourinary: Negative for dysuria.  Neurological: Negative for dizziness.       Objective:   Physical Exam  Constitutional: He is oriented to person, place, and time. He appears well-developed and well-nourished.  Neck: Neck supple.  Cardiovascular: Normal rate and regular rhythm.   Pulmonary/Chest: Effort normal and breath sounds normal. No respiratory distress. He has no wheezes. He has no rales.  Genitourinary:       Patient has a couple of external skin tags around 2:00 11:00 position which are not actively inflamed or bleeding. He has external hemorrhoid around the 5:00 position which is slightly inflamed nonthrombosed and appears to have bled recently  Musculoskeletal: He exhibits no edema.  Neurological: He is alert and oriented to person, place, and time.          Assessment & Plan:  #1  macrocytic anemia. Check further labs with B12 and TSH. #2 recent dizziness probably related to orthostasis. Resolved off of blood pressure medication. Continue close monitoring and if consistently over 140/90 we'll go back on low-dose Benicar 20 mg daily  #3 external hemorrhoids. Sitz baths. Anusol 2.5% cream twice a day.

## 2012-10-28 NOTE — Patient Instructions (Addendum)
Monitor blood pressure and be in touch if consistently > 140/90.   Hemorrhoids Hemorrhoids are enlarged (dilated) veins around the rectum. There are 2 types of hemorrhoids, and the type of hemorrhoid is determined by its location. Internal hemorrhoids occur in the veins just inside the rectum.They are usually not painful, but they may bleed.However, they may poke through to the outside and become irritated and painful. External hemorrhoids involve the veins outside the anus and can be felt as a painful swelling or hard lump near the anus.They are often itchy and may crack and bleed. Sometimes clots will form in the veins. This makes them swollen and painful. These are called thrombosed hemorrhoids. CAUSES Causes of hemorrhoids include:  Pregnancy. This increases the pressure in the hemorrhoidal veins.  Constipation.  Straining to have a bowel movement.  Obesity.  Heavy lifting or other activity that caused you to strain. TREATMENT Most of the time hemorrhoids improve in 1 to 2 weeks. However, if symptoms do not seem to be getting better or if you have a lot of rectal bleeding, your caregiver may perform a procedure to help make the hemorrhoids get smaller or remove them completely.Possible treatments include:  Rubber band ligation. A rubber band is placed at the base of the hemorrhoid to cut off the circulation.  Sclerotherapy. A chemical is injected to shrink the hemorrhoid.  Infrared light therapy. Tools are used to burn the hemorrhoid.  Hemorrhoidectomy. This is surgical removal of the hemorrhoid. HOME CARE INSTRUCTIONS   Increase fiber in your diet. Ask your caregiver about using fiber supplements.  Drink enough water and fluids to keep your urine clear or pale yellow.  Exercise regularly.  Go to the bathroom when you have the urge to have a bowel movement. Do not wait.  Avoid straining to have bowel movements.  Keep the anal area dry and clean.  Only take  over-the-counter or prescription medicines for pain, discomfort, or fever as directed by your caregiver. If your hemorrhoids are thrombosed:  Take warm sitz baths for 20 to 30 minutes, 3 to 4 times per day.  If the hemorrhoids are very tender and swollen, place ice packs on the area as tolerated. Using ice packs between sitz baths may be helpful. Fill a plastic bag with ice. Place a towel between the bag of ice and your skin.  Medicated creams and suppositories may be used or applied as directed.  Do not use a donut-shaped pillow or sit on the toilet for long periods. This increases blood pooling and pain. SEEK MEDICAL CARE IF:   You have increasing pain and swelling that is not controlled with your medicine.  You have uncontrolled bleeding.  You have difficulty or you are unable to have a bowel movement.  You have pain or inflammation outside the area of the hemorrhoids.  You have chills or an oral temperature above 102 F (38.9 C). MAKE SURE YOU:   Understand these instructions.  Will watch your condition.  Will get help right away if you are not doing well or get worse. Document Released: 11/15/2000 Document Revised: 02/10/2012 Document Reviewed: 10/29/2010 [facility] Patient Information 2013 [place], [place].

## 2012-11-02 ENCOUNTER — Other Ambulatory Visit: Payer: Self-pay | Admitting: Radiation Oncology

## 2012-11-03 ENCOUNTER — Ambulatory Visit (INDEPENDENT_AMBULATORY_CARE_PROVIDER_SITE_OTHER): Payer: Medicare Other | Admitting: Internal Medicine

## 2012-11-03 DIAGNOSIS — E538 Deficiency of other specified B group vitamins: Secondary | ICD-10-CM | POA: Diagnosis not present

## 2012-11-03 MED ORDER — CYANOCOBALAMIN 1000 MCG/ML IJ SOLN
1000.0000 ug | Freq: Once | INTRAMUSCULAR | Status: AC
  Administered 2012-11-03: 1000 ug via INTRAMUSCULAR

## 2012-11-06 DIAGNOSIS — C61 Malignant neoplasm of prostate: Secondary | ICD-10-CM | POA: Diagnosis not present

## 2012-11-06 DIAGNOSIS — R972 Elevated prostate specific antigen [PSA]: Secondary | ICD-10-CM | POA: Diagnosis not present

## 2012-11-12 ENCOUNTER — Encounter: Payer: Self-pay | Admitting: Radiation Oncology

## 2012-11-13 DIAGNOSIS — C61 Malignant neoplasm of prostate: Secondary | ICD-10-CM | POA: Diagnosis not present

## 2012-11-13 DIAGNOSIS — R300 Dysuria: Secondary | ICD-10-CM | POA: Diagnosis not present

## 2012-11-17 ENCOUNTER — Ambulatory Visit: Payer: Medicare Other | Admitting: Family Medicine

## 2012-11-17 ENCOUNTER — Ambulatory Visit
Admission: RE | Admit: 2012-11-17 | Discharge: 2012-11-17 | Disposition: A | Discharge status: Home / Self Care | Payer: Medicare Other | Source: Ambulatory Visit | Attending: Radiation Oncology | Admitting: Radiation Oncology

## 2012-11-17 ENCOUNTER — Encounter: Payer: Self-pay | Admitting: Radiation Oncology

## 2012-11-17 VITALS — BP 121/52 | HR 92 | Temp 97.5°F | Resp 20 | Wt 161.2 lb

## 2012-11-17 DIAGNOSIS — C61 Malignant neoplasm of prostate: Secondary | ICD-10-CM

## 2012-11-17 NOTE — Progress Notes (Signed)
Followup note:  [name] returns today approximately 1 month following completion of salvage radiation therapy in the management of his PSA recurrent carcinoma the prostate. His rectal discomfort remains improved. He still has some dysuria and continues with Pyridium. He was started on Flomax by Dr. Margarita Grizzle who saw him late last week. He started last night and was somewhat lightheaded. His PSA from last week was undetectable.  Physical examination: Wt Readings from Last 3 Encounters:  11/17/12 161 lb 3.2 oz (73.12 kg)  10/28/12 161 lb (73.029 kg)  10/22/12 159 lb 12.8 oz (72.485 kg)   Temp Readings from Last 3 Encounters:  11/17/12 97.5 F (36.4 C)   10/28/12 97.8 F (36.6 C) Oral  10/22/12 97.6 F (36.4 C)    BP Readings from Last 3 Encounters:  11/17/12 121/52  10/28/12 130/70  10/22/12 117/66   Pulse Readings from Last 3 Encounters:  11/17/12 92  10/22/12 76  10/19/12 78   Rectal examination not performed.  Impression: Satisfactory progress, and I'm pleased by his recent PSA.  Plan: He'll maintain his followup with Dr. Margarita Grizzle and see him back for a followup visit and PSA determination in 3 months. I've not scheduled the patient for a formal followup visit and I ask that Dr. Margarita Grizzle keep me posted on his progress.

## 2012-11-17 NOTE — Progress Notes (Signed)
Follow up Prostate cancer s/p rad txs:08/24/12-10/22/12,6600 cGy/33 sessions Alert,oriented x3, dysuria still, started on flomax  0.4mg  nightly from Dr,.Woodard And decreased pyridium to 100mg  po  prn Only got up 2x last night after starting flomax, but was a little dizzy this morning,took his blood pressure was low so he didn't take his b/p med today . 10:59 AM

## 2012-11-18 ENCOUNTER — Ambulatory Visit (INDEPENDENT_AMBULATORY_CARE_PROVIDER_SITE_OTHER): Payer: Medicare Other | Admitting: Family Medicine

## 2012-11-18 DIAGNOSIS — E538 Deficiency of other specified B group vitamins: Secondary | ICD-10-CM

## 2012-11-18 MED ORDER — CYANOCOBALAMIN 1000 MCG/ML IJ SOLN
1000.0000 ug | Freq: Once | INTRAMUSCULAR | Status: AC
  Administered 2012-11-18: 1000 ug via INTRAMUSCULAR

## 2012-12-03 ENCOUNTER — Ambulatory Visit (INDEPENDENT_AMBULATORY_CARE_PROVIDER_SITE_OTHER): Payer: Medicare Other | Admitting: Family Medicine

## 2012-12-03 DIAGNOSIS — E538 Deficiency of other specified B group vitamins: Secondary | ICD-10-CM | POA: Diagnosis not present

## 2012-12-03 MED ORDER — CYANOCOBALAMIN 1000 MCG/ML IJ SOLN
1000.0000 ug | Freq: Once | INTRAMUSCULAR | Status: AC
  Administered 2012-12-03: 1000 ug via INTRAMUSCULAR

## 2012-12-11 DIAGNOSIS — H1044 Vernal conjunctivitis: Secondary | ICD-10-CM | POA: Diagnosis not present

## 2012-12-29 ENCOUNTER — Ambulatory Visit: Payer: Medicare Other | Admitting: Family Medicine

## 2013-01-05 DIAGNOSIS — R300 Dysuria: Secondary | ICD-10-CM | POA: Diagnosis not present

## 2013-01-13 ENCOUNTER — Ambulatory Visit: Payer: Medicare Other | Admitting: Family Medicine

## 2013-01-19 ENCOUNTER — Encounter: Payer: Self-pay | Admitting: Family Medicine

## 2013-01-19 ENCOUNTER — Ambulatory Visit (INDEPENDENT_AMBULATORY_CARE_PROVIDER_SITE_OTHER): Payer: Medicare Other | Admitting: Family Medicine

## 2013-01-19 ENCOUNTER — Other Ambulatory Visit: Payer: Self-pay | Admitting: *Deleted

## 2013-01-19 ENCOUNTER — Ambulatory Visit (INDEPENDENT_AMBULATORY_CARE_PROVIDER_SITE_OTHER)
Admission: RE | Admit: 2013-01-19 | Discharge: 2013-01-19 | Disposition: A | Discharge status: Home / Self Care | Payer: Medicare Other | Source: Ambulatory Visit | Attending: Family Medicine | Admitting: Family Medicine

## 2013-01-19 VITALS — BP 130/68 | Temp 97.7°F | Wt 165.0 lb

## 2013-01-19 DIAGNOSIS — M25551 Pain in right hip: Secondary | ICD-10-CM

## 2013-01-19 DIAGNOSIS — IMO0001 Reserved for inherently not codable concepts without codable children: Secondary | ICD-10-CM

## 2013-01-19 DIAGNOSIS — M25559 Pain in unspecified hip: Secondary | ICD-10-CM | POA: Diagnosis not present

## 2013-01-19 DIAGNOSIS — D649 Anemia, unspecified: Secondary | ICD-10-CM

## 2013-01-19 DIAGNOSIS — R030 Elevated blood-pressure reading, without diagnosis of hypertension: Secondary | ICD-10-CM

## 2013-01-19 DIAGNOSIS — E538 Deficiency of other specified B group vitamins: Secondary | ICD-10-CM | POA: Diagnosis not present

## 2013-01-19 LAB — CBC WITH DIFFERENTIAL/PLATELET
Basophils Absolute: 0.1 10*3/uL (ref 0.0–0.1)
Basophils Relative: 1.1 % (ref 0.0–3.0)
Eosinophils Absolute: 0.2 10*3/uL (ref 0.0–0.7)
Eosinophils Relative: 4.3 % (ref 0.0–5.0)
HCT: 38.9 % — ABNORMAL LOW (ref 39.0–52.0)
Hemoglobin: 12.9 g/dL — ABNORMAL LOW (ref 13.0–17.0)
Lymphocytes Relative: 19.7 % (ref 12.0–46.0)
Lymphs Abs: 1.1 10*3/uL (ref 0.7–4.0)
MCHC: 33.3 g/dL (ref 30.0–36.0)
MCV: 104.6 fl — ABNORMAL HIGH (ref 78.0–100.0)
Monocytes Absolute: 0.6 10*3/uL (ref 0.1–1.0)
Monocytes Relative: 11.4 % (ref 3.0–12.0)
Neutro Abs: 3.5 10*3/uL (ref 1.4–7.7)
Neutrophils Relative %: 63.5 % (ref 43.0–77.0)
Platelets: 169 10*3/uL (ref 150.0–400.0)
RBC: 3.72 Mil/uL — ABNORMAL LOW (ref 4.22–5.81)
RDW: 13.2 % (ref 11.5–14.6)
WBC: 5.6 10*3/uL (ref 4.5–10.5)

## 2013-01-19 LAB — VITAMIN B12: Vitamin B-12: 304 pg/mL (ref 211–911)

## 2013-01-19 NOTE — Progress Notes (Signed)
Subjective:    Patient ID: [name], male    DOB: [birth year], 77 y.o.   MRN: [id number]  HPI  Patient seen for followup regarding issues below  Patient presented with increased fatigue several months ago. Labs revealed mild anemia with hemoglobin 12.1 with macrocytosis. B12 level low 176.  several injections B12 since then and overall energy levels have improved. His TSH was normal. He is here today for repeat B12 level.  History of prostate cancer. Ongoing urinary difficulties. Followed closely by urology and currently treated with oxybutynin and Flomax.  Right hip pains. Duration approximately one year. Location is right lateral hip. Denies low back pain. Symptoms are worse with walking. Not as much at rest. No alleviating factors.  No history of injury  Blood pressures stable by home readings. These were reviewed.  Past Medical History  Diagnosis Date  . HYPERLIPIDEMIA 04/06/2010  . BENIGN POSITIONAL VERTIGO 04/06/2010  . HYPERTENSION 04/06/2010  . CAD 04/06/2010  . GERD 04/06/2010  . SKIN LESION 04/06/2010  . Acute sinusitis, unspecified 11/27/2010  . Coronary artery disease 2006    angioplasty  . Prostate cancer 09/06/05 dx    adenocarcinoma gleason=3+4=7,pSA=<0.01-0.13-212  . Sleep apnea   . Anal fissure   . Allergy     bee sting,,amoxicillin, hydrocodone  . Joint pain   . OSTEOARTHRITIS, GENERALIZED 04/06/2010  . History of radiation therapy 08/24/12-10/22/12    prostate 6600cGy/33 sessions   Past Surgical History  Procedure Laterality Date  . Angioplasty  1991  . Esophagas dilatation  1992  . Hernia repair  1994    b/l groin area  . Prostate surgery  11/18/05    prostatectomy, adenocarcinoma radical  . Back surgery    . Knee surgery      right arthoscopic    reports that he quit smoking about 18 years ago. His smoking use included Cigarettes. He has a 20 pack-year smoking history. He does not have any smokeless tobacco history on file. He reports that  drinks  alcohol. He reports that he does not use illicit drugs. family history includes Arthritis in his other; Cancer in his brother and father; Hyperlipidemia in his other; and Hypertension in his other. Allergies  Allergen Reactions  . Hydrocodone     REACTION: disorientation  . Amoxicillin Rash     Review of Systems  Constitutional: Negative for fever, diaphoresis, appetite change, fatigue and unexpected weight change.  Respiratory: Negative for shortness of breath and wheezing.   Cardiovascular: Negative for chest pain and leg swelling.  Gastrointestinal: Negative for abdominal pain.  Genitourinary: Positive for dysuria.  Musculoskeletal: Positive for arthralgias.  Neurological: Negative for dizziness and numbness.       Objective:   Physical Exam  Constitutional: He appears well-developed and well-nourished.  Cardiovascular: Normal rate and regular rhythm.   Pulmonary/Chest: Effort normal and breath sounds normal. No respiratory distress. He has no wheezes. He has no rales.  Musculoskeletal: He exhibits no edema.  Right hip reveals fairly good range of motion. No point tenderness. No lower extremity edema. Straight leg raise is negative. No focal weakness.          Assessment & Plan:  #1 B12 deficiency. We've ordered CBC and B12 levels. Consider oral replacement if levels back to normal. #2 right hip pain. location is mostly lateral but not reproducible so doubt bursitis. Check hip x-ray given duration of symptoms over one year #3 history of prostate cancer  #4 history of elevated blood pressure. Currently stable

## 2013-01-20 NOTE — Progress Notes (Signed)
Quick Note:  Pt informed on home VM ______ 

## 2013-01-25 ENCOUNTER — Ambulatory Visit: Payer: Medicare Other | Admitting: Family Medicine

## 2013-01-27 ENCOUNTER — Ambulatory Visit (INDEPENDENT_AMBULATORY_CARE_PROVIDER_SITE_OTHER): Payer: Medicare Other | Admitting: Family Medicine

## 2013-01-27 DIAGNOSIS — E538 Deficiency of other specified B group vitamins: Secondary | ICD-10-CM

## 2013-01-27 MED ORDER — CYANOCOBALAMIN 1000 MCG/ML IJ SOLN
1000.0000 ug | Freq: Once | INTRAMUSCULAR | Status: AC
  Administered 2013-01-27: 1000 ug via INTRAMUSCULAR

## 2013-02-08 DIAGNOSIS — C61 Malignant neoplasm of prostate: Secondary | ICD-10-CM | POA: Diagnosis not present

## 2013-02-15 DIAGNOSIS — C61 Malignant neoplasm of prostate: Secondary | ICD-10-CM | POA: Diagnosis not present

## 2013-02-15 DIAGNOSIS — R39198 Other difficulties with micturition: Secondary | ICD-10-CM | POA: Diagnosis not present

## 2013-02-15 DIAGNOSIS — R300 Dysuria: Secondary | ICD-10-CM | POA: Diagnosis not present

## 2013-02-24 ENCOUNTER — Other Ambulatory Visit: Payer: Self-pay | Admitting: *Deleted

## 2013-02-24 MED ORDER — ATORVASTATIN CALCIUM 40 MG PO TABS: 40.0000 mg | ORAL_TABLET | Freq: Every day | ORAL | Status: DC

## 2013-02-24 NOTE — Telephone Encounter (Signed)
Fax Received. Refill Completed. [name] (R.M.A)  PT understands he has to come to [facility] to  Get more refills. Pt is aware of address.

## 2013-03-24 DIAGNOSIS — R39198 Other difficulties with micturition: Secondary | ICD-10-CM | POA: Diagnosis not present

## 2013-03-31 DIAGNOSIS — R39198 Other difficulties with micturition: Secondary | ICD-10-CM | POA: Diagnosis not present

## 2013-04-05 DIAGNOSIS — Z1211 Encounter for screening for malignant neoplasm of colon: Secondary | ICD-10-CM | POA: Diagnosis not present

## 2013-04-05 DIAGNOSIS — K5730 Diverticulosis of large intestine without perforation or abscess without bleeding: Secondary | ICD-10-CM | POA: Diagnosis not present

## 2013-05-10 ENCOUNTER — Encounter: Payer: Self-pay | Admitting: Cardiovascular Disease

## 2013-05-10 ENCOUNTER — Other Ambulatory Visit: Payer: Self-pay | Admitting: *Deleted

## 2013-05-10 ENCOUNTER — Ambulatory Visit (INDEPENDENT_AMBULATORY_CARE_PROVIDER_SITE_OTHER): Payer: Medicare Other | Admitting: Cardiovascular Disease

## 2013-05-10 VITALS — BP 164/84 | HR 67 | Ht 68.0 in | Wt 161.0 lb

## 2013-05-10 DIAGNOSIS — I2510 Atherosclerotic heart disease of native coronary artery without angina pectoris: Secondary | ICD-10-CM | POA: Diagnosis not present

## 2013-05-10 DIAGNOSIS — E785 Hyperlipidemia, unspecified: Secondary | ICD-10-CM

## 2013-05-10 DIAGNOSIS — I10 Essential (primary) hypertension: Secondary | ICD-10-CM

## 2013-05-10 LAB — HEPATIC FUNCTION PANEL
ALT: 18 U/L (ref 0–53)
AST: 18 U/L (ref 0–37)
Albumin: 4.1 g/dL (ref 3.5–5.2)
Alkaline Phosphatase: 55 U/L (ref 39–117)
Bilirubin, Direct: 0.1 mg/dL (ref 0.0–0.3)
Total Bilirubin: 0.8 mg/dL (ref 0.3–1.2)
Total Protein: 7.1 g/dL (ref 6.0–8.3)

## 2013-05-10 LAB — BASIC METABOLIC PANEL
BUN: 18 mg/dL (ref 6–23)
CO2: 24 mEq/L (ref 19–32)
Calcium: 9.1 mg/dL (ref 8.4–10.5)
Chloride: 105 mEq/L (ref 96–112)
Creatinine, Ser: 1.4 mg/dL (ref 0.4–1.5)
GFR: 53.8 mL/min — ABNORMAL LOW (ref >60.00)
Glucose, Bld: 75 mg/dL (ref 70–99)
Potassium: 3.9 mEq/L (ref 3.5–5.1)
Sodium: 140 mEq/L (ref 135–145)

## 2013-05-10 LAB — LIPID PANEL
Cholesterol: 127 mg/dL (ref 0–200)
HDL: 42 mg/dL (ref >39.00)
LDL Cholesterol: 62 mg/dL (ref 0–99)
Total CHOL/HDL Ratio: 3
Triglycerides: 114 mg/dL (ref 0.0–149.0)
VLDL: 22.8 mg/dL (ref 0.0–40.0)

## 2013-05-10 MED ORDER — OLMESARTAN MEDOXOMIL 20 MG PO TABS: 20.0000 mg | ORAL_TABLET | Freq: Every day | ORAL | Status: DC

## 2013-05-10 NOTE — Assessment & Plan Note (Signed)
[name] presents today with some HTN.  He stopped his Benicar a month ago because he thought that his BP was dropping too low.  Now, his readings are elevated.  Will restart benecar at 1/2 his previous dose - 20 mg a day  I will see him again in 6 months. We'll check a basic profile at that time.  Check fasting labs today including lipid profile, liver enzymes, and basic metabolic profile.

## 2013-05-10 NOTE — Progress Notes (Signed)
[name] Date of Birth  02-13-1936 [facility]      Office  [address]    [address]   [address] [place], [place]  [address]    [place], [place]  [address] [phone]  Fax  [phone]  [phone]  Fax [phone]  Problem list: 1. Coronary artery disease- status post remote PTCA to the left complex artery in 1991 2. Hypertension 3. Hyperlipidemia 4. Prostate Cancer.  History of Present Illness:  [name] is  a 77 year old gentleman with a history of PTCA approximately 22 years ago.  He's not had any episodes of chest pain or shortness of breath. He stays fairly active although he doesn't exercise at the gym.  He has some problems with some arthritis-type pain in his right hip.  The Lipitor causes a little bit of muscular skeletal pain.  May 10, 2013:  He stopped hib Benicar 2 months ago. His BP has been running low at home.  He has been on a new medicine for his prostate and was concerned that his BP would run too low.   He has been taking a varied dose of benicar - 1 tab, 1/2 tab, 1/4 tablet whenever he thinks he needs it.   Current Outpatient Prescriptions on File Prior to Visit  Medication Sig Dispense Refill  . aspirin 81 MG tablet Take 81 mg by mouth daily.        [name] atorvastatin (LIPITOR) 40 MG tablet Take 1 tablet (40 mg total) by mouth daily.  90 tablet  0  . cyanocobalamin (,VITAMIN B-12,) 1000 MCG/ML injection Inject 1,000 mcg into the muscle once.      . docusate sodium (COLACE) 100 MG capsule Take 100 mg by mouth daily.       . hydrocortisone (ANUSOL-HC) 2.5 % rectal cream Place rectally 2 (two) times daily.  30 g  4  . naproxen sodium (ANAPROX) 220 MG tablet Take 220 mg by mouth as needed.      [name] oxybutynin (DITROPAN-XL) 5 MG 24 hr tablet Take 5 mg by mouth daily.       . pantoprazole (PROTONIX) 40 MG tablet Take 1 tablet (40 mg total) by mouth daily.  90 tablet  3  . phenazopyridine (PYRIDIUM) 200 MG tablet Take 200 mg by mouth 3  (three) times daily. Per Dr Margarita Grizzle      . polyethylene glycol (MIRALAX / GLYCOLAX) packet Take 17 g by mouth as needed.       . Tamsulosin HCl (FLOMAX) 0.4 MG CAPS Take 0.4 mg by mouth.       No current facility-administered medications on file prior to visit.    Allergies  Allergen Reactions  . Hydrocodone     REACTION: disorientation  . Amoxicillin Rash    Past Medical History  Diagnosis Date  . HYPERLIPIDEMIA 04/06/2010  . BENIGN POSITIONAL VERTIGO 04/06/2010  . HYPERTENSION 04/06/2010  . CAD 04/06/2010  . GERD 04/06/2010  . SKIN LESION 04/06/2010  . Acute sinusitis, unspecified 11/27/2010  . Coronary artery disease 2006    angioplasty  . Prostate cancer 09/06/05 dx    adenocarcinoma gleason=3+4=7,pSA=<0.01-0.13-212  . Sleep apnea   . Anal fissure   . Allergy     bee sting,,amoxicillin, hydrocodone  . Joint pain   . OSTEOARTHRITIS, GENERALIZED 04/06/2010  . History of radiation therapy 08/24/12-10/22/12    prostate 6600cGy/33 sessions    Past Surgical History  Procedure Laterality Date  . Angioplasty  1991  .  Esophagas dilatation  1992  . Hernia repair  1994    b/l groin area  . Prostate surgery  11/18/05    prostatectomy, adenocarcinoma radical  . Back surgery    . Knee surgery      right arthoscopic    History  Smoking status  . Former Smoker -- 2.00 packs/day for 10 years  . Types: Cigarettes  . Quit date: 12/03/1994  Smokeless tobacco  . Not on file    History  Alcohol Use  . Yes    Comment: occasionally    Family History  Problem Relation Age of Onset  . Cancer Father     prostate died 24 something  . Arthritis Other   . Hyperlipidemia Other   . Hypertension Other   . Cancer Brother     prostate cancer    Reviw of Systems:  Reviewed in the HPI.  All other systems are negative.  Physical Exam: Blood pressure 164/84, pulse 67, height 5\' 8"  (1.727 m), weight 161 lb (73.029 kg). General: Well developed, well nourished, in no acute  distress.  Head: Normocephalic, atraumatic, sclera non-icteric, mucus membranes are moist,   Neck: Supple. Negative for carotid bruits. JVD not elevated.  Lungs: Clear bilaterally to auscultation without wheezes, rales, or rhonchi. Breathing is unlabored.  Heart: RRR with S1 S2. No murmurs, rubs, or gallops appreciated.  Abdomen: Soft, non-tender, non-distended with normoactive bowel sounds. No hepatomegaly. No rebound/guarding. No obvious abdominal masses.  Msk:  Strength and tone appear normal for age.  Extremities: No clubbing or cyanosis. No edema.  Distal pedal pulses are 2+ and equal bilaterally.  Neuro: Alert and oriented X 3. Moves all extremities spontaneously.  Psych:  Responds to questions appropriately with a normal affect.  ECG: May 10, 2013: NSR at 10 . LAE.   Assessment / Plan:

## 2013-05-10 NOTE — Progress Notes (Signed)
Pt called and updated his med list/ MAR adjusted

## 2013-05-10 NOTE — Patient Instructions (Addendum)
Your physician wants you to follow-up in: 6 months You will receive a reminder letter in the mail two months in advance. If you don't receive a letter, please call our office to schedule the follow-up appointment.   Your physician recommends that you return for a FASTING lipid profile: today  Your physician has recommended you make the following change in your medication: restart benicar 20 mg daily.

## 2013-05-10 NOTE — Assessment & Plan Note (Signed)
Stable, no angina  

## 2013-05-13 ENCOUNTER — Other Ambulatory Visit: Payer: Self-pay | Admitting: *Deleted

## 2013-05-13 DIAGNOSIS — E785 Hyperlipidemia, unspecified: Secondary | ICD-10-CM

## 2013-05-13 DIAGNOSIS — I10 Essential (primary) hypertension: Secondary | ICD-10-CM

## 2013-05-13 MED ORDER — OLMESARTAN MEDOXOMIL 20 MG PO TABS: 20.0000 mg | ORAL_TABLET | Freq: Every day | ORAL | Status: DC

## 2013-05-13 NOTE — Telephone Encounter (Signed)
Pt will call pharmacy if he runs out of medication for more refills.

## 2013-05-17 ENCOUNTER — Emergency Department (HOSPITAL_COMMUNITY)
Admission: EM | Admit: 2013-05-17 | Discharge: 2013-05-17 | Disposition: A | Discharge status: Home / Self Care | Payer: Medicare Other | Attending: Emergency Medicine | Admitting: Emergency Medicine

## 2013-05-17 ENCOUNTER — Encounter (HOSPITAL_COMMUNITY): Payer: Self-pay | Admitting: *Deleted

## 2013-05-17 DIAGNOSIS — I2510 Atherosclerotic heart disease of native coronary artery without angina pectoris: Secondary | ICD-10-CM | POA: Insufficient documentation

## 2013-05-17 DIAGNOSIS — Z923 Personal history of irradiation: Secondary | ICD-10-CM | POA: Diagnosis not present

## 2013-05-17 DIAGNOSIS — Z872 Personal history of diseases of the skin and subcutaneous tissue: Secondary | ICD-10-CM | POA: Diagnosis not present

## 2013-05-17 DIAGNOSIS — Z8546 Personal history of malignant neoplasm of prostate: Secondary | ICD-10-CM | POA: Diagnosis not present

## 2013-05-17 DIAGNOSIS — R339 Retention of urine, unspecified: Secondary | ICD-10-CM | POA: Diagnosis not present

## 2013-05-17 DIAGNOSIS — R338 Other retention of urine: Secondary | ICD-10-CM

## 2013-05-17 DIAGNOSIS — Z87891 Personal history of nicotine dependence: Secondary | ICD-10-CM | POA: Insufficient documentation

## 2013-05-17 DIAGNOSIS — I10 Essential (primary) hypertension: Secondary | ICD-10-CM | POA: Diagnosis not present

## 2013-05-17 DIAGNOSIS — G4730 Sleep apnea, unspecified: Secondary | ICD-10-CM | POA: Diagnosis not present

## 2013-05-17 DIAGNOSIS — Z8709 Personal history of other diseases of the respiratory system: Secondary | ICD-10-CM | POA: Insufficient documentation

## 2013-05-17 DIAGNOSIS — Z8669 Personal history of other diseases of the nervous system and sense organs: Secondary | ICD-10-CM | POA: Diagnosis not present

## 2013-05-17 DIAGNOSIS — Z8739 Personal history of other diseases of the musculoskeletal system and connective tissue: Secondary | ICD-10-CM | POA: Diagnosis not present

## 2013-05-17 DIAGNOSIS — E785 Hyperlipidemia, unspecified: Secondary | ICD-10-CM | POA: Insufficient documentation

## 2013-05-17 DIAGNOSIS — Z79899 Other long term (current) drug therapy: Secondary | ICD-10-CM | POA: Diagnosis not present

## 2013-05-17 DIAGNOSIS — Z7982 Long term (current) use of aspirin: Secondary | ICD-10-CM | POA: Insufficient documentation

## 2013-05-17 DIAGNOSIS — Z8719 Personal history of other diseases of the digestive system: Secondary | ICD-10-CM | POA: Insufficient documentation

## 2013-05-17 LAB — URINALYSIS, ROUTINE W REFLEX MICROSCOPIC
Bilirubin Urine: NEGATIVE
Glucose, UA: NEGATIVE mg/dL
Hgb urine dipstick: NEGATIVE
Ketones, ur: NEGATIVE mg/dL
Leukocytes, UA: NEGATIVE
Nitrite: NEGATIVE
Protein, ur: NEGATIVE mg/dL
Specific Gravity, Urine: 1.01 (ref 1.005–1.030)
Urobilinogen, UA: 0.2 mg/dL (ref 0.0–1.0)
pH: 6.5 (ref 5.0–8.0)

## 2013-05-17 MED ORDER — LIDOCAINE HCL 2 % EX GEL
CUTANEOUS | Status: AC
  Filled 2013-05-17: qty 10

## 2013-05-17 MED ORDER — BELLADONNA ALKALOIDS-OPIUM 16.2-60 MG RE SUPP
1.0000 | Freq: Once | RECTAL | Status: AC
  Administered 2013-05-17: 1 via RECTAL
  Filled 2013-05-17: qty 1

## 2013-05-17 NOTE — ED Notes (Signed)
Pt c/o urinary retention x > 12 hrs. Pt in obvious discomfort.

## 2013-05-17 NOTE — ED Notes (Signed)
Patient is resting comfortably. 

## 2013-05-17 NOTE — ED Provider Notes (Signed)
History     CSN: [id number]  Arrival date & time 05/17/13  0355   First MD Initiated Contact with Patient 05/17/13 [phone]      Chief Complaint  Patient presents with  . Urinary Retention    (Consider location/radiation/quality/duration/timing/severity/associated sxs/prior treatment) The history is provided by the patient.   77 year old male has been unable to urinate since noon. He had called his urologist who stated that he could be seen in the office at 8 AM but to come to the ED if he is not able to make it that long. He does state that he had run out of his prescription of tamsulosin with his last dose 2 days ago.  Past Medical History  Diagnosis Date  . HYPERLIPIDEMIA 04/06/2010  . BENIGN POSITIONAL VERTIGO 04/06/2010  . HYPERTENSION 04/06/2010  . CAD 04/06/2010  . GERD 04/06/2010  . SKIN LESION 04/06/2010  . Acute sinusitis, unspecified 11/27/2010  . Coronary artery disease 2006    angioplasty  . Prostate cancer 09/06/05 dx    adenocarcinoma gleason=3+4=7,pSA=<0.01-0.13-212  . Sleep apnea   . Anal fissure   . Allergy     bee sting,,amoxicillin, hydrocodone  . Joint pain   . OSTEOARTHRITIS, GENERALIZED 04/06/2010  . History of radiation therapy 08/24/12-10/22/12    prostate 6600cGy/33 sessions    Past Surgical History  Procedure Laterality Date  . Angioplasty  1991  . Esophagas dilatation  1992  . Hernia repair  1994    b/l groin area  . Prostate surgery  11/18/05    prostatectomy, adenocarcinoma radical  . Back surgery    . Knee surgery      right arthoscopic    Family History  Problem Relation Age of Onset  . Cancer Father     prostate died 98 something  . Arthritis Other   . Hyperlipidemia Other   . Hypertension Other   . Cancer Brother     prostate cancer    History  Substance Use Topics  . Smoking status: Former Smoker -- 2.00 packs/day for 10 years    Types: Cigarettes    Quit date: 12/03/1994  . Smokeless tobacco: Not on file  . Alcohol Use: Yes      Comment: occasionally      Review of Systems  All other systems reviewed and are negative.    Allergies  Hydrocodone and Amoxicillin  Home Medications   Current Outpatient Rx  Name  Route  Sig  Dispense  Refill  . aspirin 81 MG tablet   Oral   Take 81 mg by mouth daily.           [name] atorvastatin (LIPITOR) 40 MG tablet   Oral   Take 1 tablet (40 mg total) by mouth daily.   90 tablet   0   . cyanocobalamin (,VITAMIN B-12,) 1000 MCG/ML injection   Intramuscular   Inject 1,000 mcg into the muscle once.         . docusate sodium (COLACE) 100 MG capsule   Oral   Take 100 mg by mouth daily.          . hydrocortisone (ANUSOL-HC) 2.5 % rectal cream   Rectal   Place rectally 2 (two) times daily.   30 g   4   . naproxen sodium (ANAPROX) 220 MG tablet   Oral   Take 220 mg by mouth as needed.         [name] olmesartan (BENICAR) 20 MG tablet   Oral  Take 1 tablet (20 mg total) by mouth daily.   90 tablet   3   . pantoprazole (PROTONIX) 40 MG tablet   Oral   Take 1 tablet (40 mg total) by mouth daily.   90 tablet   3   . polyethylene glycol (MIRALAX / GLYCOLAX) packet   Oral   Take 17 g by mouth as needed.          . Tamsulosin HCl (FLOMAX) 0.4 MG CAPS   Oral   Take 0.4 mg by mouth.         [name] URELLE (URELLE/URISED) 81 MG TABS   Oral   Take 1 tablet (81 mg total) by mouth every morning.   120 each        BP 142/129  Pulse 79  Temp(Src) 98.1 F (36.7 C) (Oral)  Resp 24  SpO2 100%  Physical Exam  Nursing note and vitals reviewed.  77 year old male, who is very uncomfortable, but is in no acute distress. Vital signs are significant for hypertension with blood pressure 142/129, and tachypnea with respiratory rate of 24. Oxygen saturation is 100%, which is normal. Head is normocephalic and atraumatic. PERRLA, EOMI. Oropharynx is clear. Neck is nontender and supple without adenopathy or JVD. Back is nontender and there is no CVA  tenderness. Lungs are clear without rales, wheezes, or rhonchi. Chest is nontender. Heart has regular rate and rhythm without murmur. Abdomen is soft, flat, nontender with bladder distended to the umbilicus. There are no other masses or hepatosplenomegaly and peristalsis is normoactive. Extremities have no cyanosis or edema, full range of motion is present. Skin is warm and dry without rash. Neurologic: Mental status is normal, cranial nerves are intact, there are no motor or sensory deficits.  ED Course  Procedures (including critical care time)  Results for orders placed during the hospital encounter of 05/17/13  URINALYSIS, ROUTINE W REFLEX MICROSCOPIC      Result Value Range   Color, Urine GREEN (*) YELLOW   APPearance CLEAR  CLEAR   Specific Gravity, Urine 1.010  1.005 - 1.030   pH 6.5  5.0 - 8.0   Glucose, UA NEGATIVE  NEGATIVE mg/dL   Hgb urine dipstick NEGATIVE  NEGATIVE   Bilirubin Urine NEGATIVE  NEGATIVE   Ketones, ur NEGATIVE  NEGATIVE mg/dL   Protein, ur NEGATIVE  NEGATIVE mg/dL   Urobilinogen, UA 0.2  0.0 - 1.0 mg/dL   Nitrite NEGATIVE  NEGATIVE   Leukocytes, UA NEGATIVE  NEGATIVE    1. Acute urinary retention       MDM  Acute urinary retention. Foley catheter was placed which yielded 900 mL of clear urine. He will be referred to urology for followup.        Dione Booze, MD 05/17/13 [phone]

## 2013-05-17 NOTE — ED Notes (Signed)
Pt denies pain since foley insertion. States he has had hesitancy and oliguria x 12 hours. Had radiation 1 year ago and micturating has not been quite the same.

## 2013-05-17 NOTE — ED Notes (Signed)
Called pharmacy to verify dosage of Belladonna and Opium suppository as after scanning the medication, EPIC did not state the dosage- verified with pharmacy Shanda Bumps) that 16.2mg  of Belladonna and 60 mg of Opium is the correct dose.

## 2013-05-17 NOTE — ED Notes (Signed)
Upon discharge pt admits to having bladder spasms. States he did not inform MD.  MD aware and order for spasms given.  Will administer prior to discharge.  Leg bag placed and taught patient how to use device.

## 2013-05-20 DIAGNOSIS — R339 Retention of urine, unspecified: Secondary | ICD-10-CM | POA: Diagnosis not present

## 2013-05-26 ENCOUNTER — Other Ambulatory Visit: Payer: Self-pay | Admitting: Cardiovascular Disease

## 2013-05-27 DIAGNOSIS — R339 Retention of urine, unspecified: Secondary | ICD-10-CM | POA: Diagnosis not present

## 2013-06-17 ENCOUNTER — Encounter: Payer: Self-pay | Admitting: Family Medicine

## 2013-06-17 ENCOUNTER — Ambulatory Visit (INDEPENDENT_AMBULATORY_CARE_PROVIDER_SITE_OTHER): Payer: Medicare Other | Admitting: Family Medicine

## 2013-06-17 VITALS — BP 118/66 | HR 68 | Temp 97.6°F | Wt 163.0 lb

## 2013-06-17 DIAGNOSIS — M545 Low back pain, unspecified: Secondary | ICD-10-CM

## 2013-06-17 MED ORDER — METAXALONE 800 MG PO TABS: 800.0000 mg | ORAL_TABLET | Freq: Three times a day (TID) | ORAL | Status: DC

## 2013-06-17 NOTE — Patient Instructions (Addendum)

## 2013-06-17 NOTE — Progress Notes (Signed)
  Subjective:    Patient ID: [name], male    DOB: [birth year], 77 y.o.   MRN: [id number]  HPI Acute visit for low back pain.  Onset 2 days ago after bending over to pick up fertilizer. Location is lower lumbar center.  No radiation. Quality is sharp.  Severity: 8/10 Alleviating: sitting,walking.  Worse change of postion No weakness or numbness  Has taken Advil with no relief. Denies any recent chest pains.  Past Medical History  Diagnosis Date  . HYPERLIPIDEMIA 04/06/2010  . BENIGN POSITIONAL VERTIGO 04/06/2010  . HYPERTENSION 04/06/2010  . CAD 04/06/2010  . GERD 04/06/2010  . SKIN LESION 04/06/2010  . Acute sinusitis, unspecified 11/27/2010  . Coronary artery disease 2006    angioplasty  . Prostate cancer 09/06/05 dx    adenocarcinoma gleason=3+4=7,pSA=<0.01-0.13-212  . Sleep apnea   . Anal fissure   . Allergy     bee sting,,amoxicillin, hydrocodone  . Joint pain   . OSTEOARTHRITIS, GENERALIZED 04/06/2010  . History of radiation therapy 08/24/12-10/22/12    prostate 6600cGy/33 sessions   Past Surgical History  Procedure Laterality Date  . Angioplasty  1991  . Esophagas dilatation  1992  . Hernia repair  1994    b/l groin area  . Prostate surgery  11/18/05    prostatectomy, adenocarcinoma radical  . Back surgery    . Knee surgery      right arthoscopic    reports that he quit smoking about 18 years ago. His smoking use included Cigarettes. He has a 20 pack-year smoking history. He does not have any smokeless tobacco history on file. He reports that  drinks alcohol. He reports that he does not use illicit drugs. family history includes Arthritis in his other; Cancer in his brother and father; Hyperlipidemia in his other; and Hypertension in his other. Allergies  Allergen Reactions  . Hydrocodone     REACTION: disorientation  . Amoxicillin Rash      Review of Systems  Constitutional: Negative for fever, chills, appetite change and unexpected weight change.   Cardiovascular: Negative for chest pain.  Gastrointestinal: Negative for abdominal pain.  Genitourinary: Negative for dysuria.  Musculoskeletal: Positive for back pain.  Neurological: Negative for weakness and numbness.       Objective:   Physical Exam  Constitutional: He appears well-developed and well-nourished.  Cardiovascular: Normal rate and regular rhythm.   Pulmonary/Chest: Effort normal and breath sounds normal. No respiratory distress. He has no wheezes. He has no rales.  Musculoskeletal:  Straight leg raises are negative  Neurological:  Full-strength lower extremities. Trace reflexes knee and ankle bilaterally. No lower extremity edema. Good distal pulses          Assessment & Plan:  Acute lumbar back strain. Nonfocal exam. Refill Skelaxin 800 mg every 8 hours which has helped him in the past. Continue short-term use of Aleve. Continue back stretches and consider physical therapy 2 weeks if no better

## 2013-06-23 ENCOUNTER — Ambulatory Visit (INDEPENDENT_AMBULATORY_CARE_PROVIDER_SITE_OTHER): Payer: Medicare Other | Admitting: Family Medicine

## 2013-06-23 ENCOUNTER — Encounter: Payer: Self-pay | Admitting: Family Medicine

## 2013-06-23 VITALS — BP 134/76 | HR 64 | Temp 97.8°F | Wt 164.0 lb

## 2013-06-23 DIAGNOSIS — M545 Low back pain, unspecified: Secondary | ICD-10-CM

## 2013-06-23 MED ORDER — MELOXICAM 15 MG PO TABS: 15.0000 mg | ORAL_TABLET | Freq: Every day | ORAL | Status: DC

## 2013-06-23 NOTE — Progress Notes (Signed)
  Subjective:    Patient ID: [name], male    DOB: [birth year], 77 y.o.   MRN: [id number]  HPI Persistent lumbar back pain. Initially strained this lifting some fertilizer couple weeks ago. He had seen some improvement but then reaggravated a few days ago. Location remains bilateral lumbar area. Worse with sitting and standing now. Sharp 8/10 at its worst. Used ice without relief. Used Skelaxin without relief.  No radiculopathy symptoms. No weakness or numbness. He has not tried any anti-inflammatories other than occasional Advil. No recent appetite or weight change. No fevers or chills. No abdominal pain.  Past Medical History  Diagnosis Date  . HYPERLIPIDEMIA 04/06/2010  . BENIGN POSITIONAL VERTIGO 04/06/2010  . HYPERTENSION 04/06/2010  . CAD 04/06/2010  . GERD 04/06/2010  . SKIN LESION 04/06/2010  . Acute sinusitis, unspecified 11/27/2010  . Coronary artery disease 2006    angioplasty  . Prostate cancer 09/06/05 dx    adenocarcinoma gleason=3+4=7,pSA=<0.01-0.13-212  . Sleep apnea   . Anal fissure   . Allergy     bee sting,,amoxicillin, hydrocodone  . Joint pain   . OSTEOARTHRITIS, GENERALIZED 04/06/2010  . History of radiation therapy 08/24/12-10/22/12    prostate 6600cGy/33 sessions   Past Surgical History  Procedure Laterality Date  . Angioplasty  1991  . Esophagas dilatation  1992  . Hernia repair  1994    b/l groin area  . Prostate surgery  11/18/05    prostatectomy, adenocarcinoma radical  . Back surgery    . Knee surgery      right arthoscopic    reports that he quit smoking about 18 years ago. His smoking use included Cigarettes. He has a 20 pack-year smoking history. He does not have any smokeless tobacco history on file. He reports that  drinks alcohol. He reports that he does not use illicit drugs. family history includes Arthritis in his other; Cancer in his brother and father; Hyperlipidemia in his other; and Hypertension in his other. Allergies  Allergen  Reactions  . Hydrocodone     REACTION: disorientation  . Amoxicillin Rash      Review of Systems  Constitutional: Negative for fever, chills, activity change, appetite change and unexpected weight change.  Respiratory: Negative for cough and shortness of breath.   Cardiovascular: Negative for chest pain and leg swelling.  Gastrointestinal: Negative for vomiting and abdominal pain.  Genitourinary: Negative for dysuria, hematuria and flank pain.  Musculoskeletal: Positive for back pain. Negative for joint swelling.  Neurological: Negative for weakness and numbness.       Objective:   Physical Exam  Constitutional: He appears well-developed and well-nourished.  Cardiovascular: Normal rate and regular rhythm.   Pulmonary/Chest: Breath sounds normal. No respiratory distress. He has no wheezes. He has no rales.  Musculoskeletal: He exhibits no edema.  Straight leg raises are negative  Neurological:  Deep tender reflexes remain trace knee and ankle bilaterally. No strength deficits. Gait is normal.          Assessment & Plan:  Lumbar back pain with nonfocal neurologic exam. Meloxicam 15 mg once daily. Reviewed stretches. Discussed physical therapy and he would like to give anti-inflammatory 1-2 weeks. Continue home stretches and walking as tolerated

## 2013-06-23 NOTE — Patient Instructions (Addendum)
Call me in 1-2 weeks if no improvement

## 2013-07-13 DIAGNOSIS — R39198 Other difficulties with micturition: Secondary | ICD-10-CM | POA: Diagnosis not present

## 2013-07-13 DIAGNOSIS — R339 Retention of urine, unspecified: Secondary | ICD-10-CM | POA: Diagnosis not present

## 2013-08-03 ENCOUNTER — Encounter: Payer: Self-pay | Admitting: Family Medicine

## 2013-08-03 ENCOUNTER — Telehealth: Payer: Self-pay | Admitting: *Deleted

## 2013-08-03 ENCOUNTER — Ambulatory Visit (INDEPENDENT_AMBULATORY_CARE_PROVIDER_SITE_OTHER): Payer: Medicare Other | Admitting: Family Medicine

## 2013-08-03 VITALS — BP 110/64 | HR 72 | Temp 97.7°F | Wt 163.0 lb

## 2013-08-03 DIAGNOSIS — I10 Essential (primary) hypertension: Secondary | ICD-10-CM | POA: Diagnosis not present

## 2013-08-03 DIAGNOSIS — R079 Chest pain, unspecified: Secondary | ICD-10-CM | POA: Diagnosis not present

## 2013-08-03 DIAGNOSIS — I2510 Atherosclerotic heart disease of native coronary artery without angina pectoris: Secondary | ICD-10-CM | POA: Diagnosis not present

## 2013-08-03 DIAGNOSIS — E538 Deficiency of other specified B group vitamins: Secondary | ICD-10-CM

## 2013-08-03 LAB — CBC WITH DIFFERENTIAL/PLATELET
Basophils Absolute: 0.1 10*3/uL (ref 0.0–0.1)
Basophils Relative: 0.8 % (ref 0.0–3.0)
Eosinophils Absolute: 0.3 10*3/uL (ref 0.0–0.7)
Eosinophils Relative: 4.9 % (ref 0.0–5.0)
HCT: 41.2 % (ref 39.0–52.0)
Hemoglobin: 13.7 g/dL (ref 13.0–17.0)
Lymphocytes Relative: 20.6 % (ref 12.0–46.0)
Lymphs Abs: 1.4 10*3/uL (ref 0.7–4.0)
MCHC: 33.3 g/dL (ref 30.0–36.0)
MCV: 102.8 fl — ABNORMAL HIGH (ref 78.0–100.0)
Monocytes Absolute: 0.8 10*3/uL (ref 0.1–1.0)
Monocytes Relative: 11.9 % (ref 3.0–12.0)
Neutro Abs: 4.2 10*3/uL (ref 1.4–7.7)
Neutrophils Relative %: 61.8 % (ref 43.0–77.0)
Platelets: 162 10*3/uL (ref 150.0–400.0)
RBC: 4.01 Mil/uL — ABNORMAL LOW (ref 4.22–5.81)
RDW: 13.9 % (ref 11.5–14.6)
WBC: 6.7 10*3/uL (ref 4.5–10.5)

## 2013-08-03 LAB — BASIC METABOLIC PANEL
BUN: 21 mg/dL (ref 6–23)
CO2: 27 mEq/L (ref 19–32)
Calcium: 9.5 mg/dL (ref 8.4–10.5)
Chloride: 103 mEq/L (ref 96–112)
Creatinine, Ser: 1.6 mg/dL — ABNORMAL HIGH (ref 0.4–1.5)
GFR: 45.94 mL/min — ABNORMAL LOW (ref >60.00)
Glucose, Bld: 83 mg/dL (ref 70–99)
Potassium: 4.8 mEq/L (ref 3.5–5.1)
Sodium: 135 mEq/L (ref 135–145)

## 2013-08-03 LAB — VITAMIN B12: Vitamin B-12: 739 pg/mL (ref 211–911)

## 2013-08-03 NOTE — Progress Notes (Signed)
Subjective:    Patient ID: [name], male    DOB: Dec 02, 1936, 77 y.o.   MRN: [id number]  HPI Patient seen as a work in.  He called this morning complaining of some nonspecific lightheadedness and reported blood pressure at home of 68/48. We question the accuracy of blood pressure reading as he was not describing any clear orthostatic symptoms. He is now here for further evaluation.  On questioning, he relates Saturday he was working outside putting out some lime and noticed some substernal chest tightness and increasde fatigue and shortness of breath which promptly resolved with rest. Denied any neck or left upper extremity symptoms. This morning he was getting out ladder to do some work and in process of adjusting the height of the ladder noticed extreme fatigue and somewhat similar symptoms.  Again, symptoms promptly resolved with rest. He has not had any symptoms at rest.  He has history of CAD with angioplasty way back in 1991. Nuclear stress test 2010 normal. He denies any symptoms at this time.  Past medical history otherwise significant for history of B12 deficiency, prostate cancer, osteoarthritis, hypertension hyperlipidemia, GERD. Denies any recent GERD symptoms. He is currently taking oral B12 replacement. History of mild anemia with most recent hemoglobin 12.9.  Past Medical History  Diagnosis Date  . HYPERLIPIDEMIA 04/06/2010  . BENIGN POSITIONAL VERTIGO 04/06/2010  . HYPERTENSION 04/06/2010  . CAD 04/06/2010  . GERD 04/06/2010  . SKIN LESION 04/06/2010  . Acute sinusitis, unspecified 11/27/2010  . Coronary artery disease 2006    angioplasty  . Prostate cancer 09/06/05 dx    adenocarcinoma gleason=3+4=7,pSA=<0.01-0.13-212  . Sleep apnea   . Anal fissure   . Allergy     bee sting,,amoxicillin, hydrocodone  . Joint pain   . OSTEOARTHRITIS, GENERALIZED 04/06/2010  . History of radiation therapy 08/24/12-10/22/12    prostate 6600cGy/33 sessions   Past Surgical History   Procedure Laterality Date  . Angioplasty  1991  . Esophagas dilatation  1992  . Hernia repair  1994    b/l groin area  . Prostate surgery  11/18/05    prostatectomy, adenocarcinoma radical  . Back surgery    . Knee surgery      right arthoscopic    reports that he quit smoking about 18 years ago. His smoking use included Cigarettes. He has a 20 pack-year smoking history. He does not have any smokeless tobacco history on file. He reports that  drinks alcohol. He reports that he does not use illicit drugs. family history includes Arthritis in his other; Cancer in his brother and father; Hyperlipidemia in his other; Hypertension in his other. Allergies  Allergen Reactions  . Hydrocodone     REACTION: disorientation  . Amoxicillin Rash      Review of Systems  Constitutional: Positive for fatigue. Negative for appetite change and unexpected weight change.  Eyes: Negative for visual disturbance.  Respiratory: Positive for shortness of breath. Negative for cough and chest tightness.   Cardiovascular: Positive for chest pain. Negative for palpitations and leg swelling.  Gastrointestinal: Negative for abdominal pain.  Neurological: Positive for light-headedness. Negative for syncope, weakness and headaches.  Hematological: Negative for adenopathy.       Objective:   Physical Exam  Constitutional: He appears well-developed and well-nourished.  Neck: Neck supple. No thyromegaly present.  Cardiovascular: Normal rate and regular rhythm.  Exam reveals no gallop.   Pulmonary/Chest: Effort normal and breath sounds normal. No respiratory distress. He has no wheezes. He has no  rales.  Musculoskeletal: He exhibits no edema.  Lymphadenopathy:    He has no cervical adenopathy.          Assessment & Plan:  Patient presents with 2 separate episodes of recent exertional chest pain and dyspnea. With history of CAD concerning for exertional angina. Check EKG. Set up with cardiology for close  followup. No exertional activities until followup. EKG reveals no acute changes. Inverted T waves V2 otherwise no change from previous tracing 05/10/2013  I spoke with Dr Harvie Bridge nurse and they will try to work him in this week for further evaluation.    History of mild anemia with B12 deficiency. Currently on oral replacement. Check CBC and B12 level

## 2013-08-03 NOTE — Telephone Encounter (Signed)
Pt was given an app for this week, to go to ED with further episodes, pt agreed to plan.

## 2013-08-03 NOTE — Patient Instructions (Addendum)
Avoid any exertional activity until further evaluated by cardiology.   Follow up immediately for any chest pain at rest or worsening symptoms. You should receive a call from cardiologist this week regarding follow up

## 2013-08-03 NOTE — Telephone Encounter (Signed)
Per Dr Caryl Never, pt needs to be seen this week for cp with sob, 2 episodes. EKG was done, no changes when comparied to prior EKG except t inversion in one lead. Pt will be called and fit into the schedule this week/ Dr Caryl Never felt it appropriate for anytime this week.

## 2013-08-04 ENCOUNTER — Encounter: Payer: Self-pay | Admitting: Cardiovascular Disease

## 2013-08-05 ENCOUNTER — Encounter: Payer: Self-pay | Admitting: Cardiology

## 2013-08-06 ENCOUNTER — Ambulatory Visit (INDEPENDENT_AMBULATORY_CARE_PROVIDER_SITE_OTHER): Payer: Medicare Other | Admitting: Cardiovascular Disease

## 2013-08-06 ENCOUNTER — Encounter: Payer: Self-pay | Admitting: Cardiovascular Disease

## 2013-08-06 VITALS — BP 125/69 | HR 55 | Ht 68.5 in | Wt 162.8 lb

## 2013-08-06 DIAGNOSIS — I200 Unstable angina: Secondary | ICD-10-CM | POA: Diagnosis not present

## 2013-08-06 DIAGNOSIS — I2510 Atherosclerotic heart disease of native coronary artery without angina pectoris: Secondary | ICD-10-CM

## 2013-08-06 DIAGNOSIS — R0602 Shortness of breath: Secondary | ICD-10-CM

## 2013-08-06 DIAGNOSIS — R079 Chest pain, unspecified: Secondary | ICD-10-CM

## 2013-08-06 NOTE — Patient Instructions (Addendum)
Your physician has requested that you have a lexiscan myoview.  Please follow instruction sheet, as given.  Your physician wants you to follow-up in: 6 months  You will receive a reminder letter in the mail two months in advance. If you don't receive a letter, please call our office to schedule the follow-up appointment.  Your physician recommends that you return for a FASTING lipid profile: 6 months

## 2013-08-06 NOTE — Assessment & Plan Note (Signed)
[name] has a history of coronary artery disease status post PTCA approximately 20 years ago. He's done fairly well but now presents with symptoms consistent with exertional angina. He's had these symptoms while washing his car and then again after cleaning out the gutters. He initially presented on the hot weather.  I think that his symptoms are worrisome for unstable angina. We'll schedule him for a low level stress/ Lexiscan Myoview.  I'll plan on seeing him in 6 months for followup visit. We'll check fasting labs at that time. His Myoview study is abnormal then we will schedule him for a cardiac catheterization.

## 2013-08-06 NOTE — Progress Notes (Signed)
[name] Date of Birth  03-16-1936 [facility]     [place] Office  [address]    [address]   [address] [place], [place]  [address]    [place], [place]  [address] [phone]  Fax  [phone]  [phone]  Fax [phone]  Problem list: 1. Coronary artery disease- status post remote PTCA to the left complex artery in 1991 2. Hypertension 3. Hyperlipidemia 4. Prostate Cancer.  History of Present Illness:  [name] is  a 77 year old gentleman with a history of PTCA approximately 22 years ago.  He's not had any episodes of chest pain or shortness of breath. He stays fairly active although he doesn't exercise at the gym.  He has some problems with some arthritis-type pain in his right hip.  The Lipitor causes a little bit of muscular skeletal pain.  May 10, 2013:  He stopped hib Benicar 2 months ago. His BP has been running low at home.  He has been on a new medicine for his prostate and was concerned that his BP would run too low.   He has been taking a varied dose of benicar - 1 tab, 1/2 tab, 1/4 tablet whenever he thinks he needs it.   Sept. 5, 2014:  [name] presents today for worsening shortness of breath and chest pain.  Last Saturday, he mowed the lawn and spread 200 lbs of lime.  He developed severe CP and started to take some NTG.  The pain finally eased up. Several days later, he had recurrent CP while cleaning out the gutters.    He thinks he was just overheated.   He has been recording his BP and has been getting some low readings.     He has been having some hip pain and does not know if he will be able to walk on the treadmill.   Current Outpatient Prescriptions on File Prior to Visit  Medication Sig Dispense Refill  . aspirin 81 MG tablet Take 81 mg by mouth daily.        [name] atorvastatin (LIPITOR) 40 MG tablet TAKE 1 TABLET DAILY  90 tablet  0  . docusate sodium (COLACE) 100 MG capsule Take 100 mg by mouth 2 (two) times daily.      [name]  olmesartan (BENICAR) 20 MG tablet Take 1 tablet (20 mg total) by mouth daily.  90 tablet  3  . Tamsulosin HCl (FLOMAX) 0.4 MG CAPS Take 0.4 mg by mouth daily.        No current facility-administered medications on file prior to visit.    Allergies  Allergen Reactions  . Bee Venom   . Hydrocodone     REACTION: disorientation  . Amoxicillin Rash    Past Medical History  Diagnosis Date  . HYPERLIPIDEMIA 04/06/2010  . BENIGN POSITIONAL VERTIGO 04/06/2010  . HYPERTENSION 04/06/2010  . CAD 04/06/2010  . GERD 04/06/2010  . SKIN LESION 04/06/2010  . Acute sinusitis, unspecified 11/27/2010  . Coronary artery disease 2006    angioplasty  . Prostate cancer 09/06/05 dx    adenocarcinoma gleason=3+4=7,pSA=<0.01-0.13-212  . Sleep apnea   . Anal fissure   . Allergy     bee sting,,amoxicillin, hydrocodone  . Joint pain   . OSTEOARTHRITIS, GENERALIZED 04/06/2010  . History of radiation therapy 08/24/12-10/22/12    prostate 6600cGy/33 sessions  . B12 deficiency     Past Surgical History  Procedure Laterality Date  . Angioplasty  1991  . Esophagas dilatation  1992  . Hernia repair  1994    b/l groin area  . Prostate surgery  11/18/05    prostatectomy, adenocarcinoma radical  . Back surgery    . Knee surgery      right arthoscopic    History  Smoking status  . Former Smoker -- 2.00 packs/day for 10 years  . Types: Cigarettes  . Quit date: 12/03/1994  Smokeless tobacco  . Not on file    History  Alcohol Use  . Yes    Comment: occasionally    Family History  Problem Relation Age of Onset  . Cancer Father     prostate died 32 something  . Arthritis Other   . Hyperlipidemia Other   . Hypertension Other   . Cancer Brother     prostate cancer    Reviw of Systems:  Reviewed in the HPI.  All other systems are negative.  Physical Exam: Blood pressure 125/69, pulse 55, height 5' 8.5" (1.74 m), weight 162 lb 12.8 oz (73.846 kg). General: Well developed, well nourished, in no acute  distress.  Head: Normocephalic, atraumatic, sclera non-icteric, mucus membranes are moist,   Neck: Supple. Negative for carotid bruits. JVD not elevated.  Lungs: Clear bilaterally to auscultation without wheezes, rales, or rhonchi. Breathing is unlabored.  Heart: RRR with S1 S2. No murmurs, rubs, or gallops appreciated.  Abdomen: Soft, non-tender, non-distended with normoactive bowel sounds. No hepatomegaly. No rebound/guarding. No obvious abdominal masses.  Msk:  Strength and tone appear normal for age.  Extremities: No clubbing or cyanosis. No edema.  Distal pedal pulses are 2+ and equal bilaterally.  Neuro: Alert and oriented X 3. Moves all extremities spontaneously.  Psych:  Responds to questions appropriately with a normal affect.  ECG: Sept. 5, 2014:  Sinus brady at 76.  LAE, no ST or T wave abnormalities  Assessment / Plan:

## 2013-08-17 DIAGNOSIS — C61 Malignant neoplasm of prostate: Secondary | ICD-10-CM | POA: Diagnosis not present

## 2013-08-18 ENCOUNTER — Ambulatory Visit (HOSPITAL_COMMUNITY): Payer: Medicare Other | Attending: Cardiology | Admitting: Radiology

## 2013-08-18 VITALS — BP 141/75 | Ht 68.5 in | Wt 162.0 lb

## 2013-08-18 DIAGNOSIS — I2510 Atherosclerotic heart disease of native coronary artery without angina pectoris: Secondary | ICD-10-CM

## 2013-08-18 DIAGNOSIS — R5381 Other malaise: Secondary | ICD-10-CM | POA: Diagnosis not present

## 2013-08-18 DIAGNOSIS — Z87891 Personal history of nicotine dependence: Secondary | ICD-10-CM | POA: Insufficient documentation

## 2013-08-18 DIAGNOSIS — R0602 Shortness of breath: Secondary | ICD-10-CM | POA: Diagnosis not present

## 2013-08-18 DIAGNOSIS — R0989 Other specified symptoms and signs involving the circulatory and respiratory systems: Secondary | ICD-10-CM | POA: Insufficient documentation

## 2013-08-18 DIAGNOSIS — I10 Essential (primary) hypertension: Secondary | ICD-10-CM | POA: Insufficient documentation

## 2013-08-18 DIAGNOSIS — I4949 Other premature depolarization: Secondary | ICD-10-CM | POA: Diagnosis not present

## 2013-08-18 DIAGNOSIS — R0609 Other forms of dyspnea: Secondary | ICD-10-CM | POA: Insufficient documentation

## 2013-08-18 DIAGNOSIS — Z8249 Family history of ischemic heart disease and other diseases of the circulatory system: Secondary | ICD-10-CM | POA: Diagnosis not present

## 2013-08-18 DIAGNOSIS — R42 Dizziness and giddiness: Secondary | ICD-10-CM | POA: Insufficient documentation

## 2013-08-18 DIAGNOSIS — R079 Chest pain, unspecified: Secondary | ICD-10-CM | POA: Diagnosis not present

## 2013-08-18 MED ORDER — TECHNETIUM TC 99M SESTAMIBI GENERIC - CARDIOLITE
29.0000 | Freq: Once | INTRAVENOUS | Status: AC | PRN
  Administered 2013-08-18: 29 via INTRAVENOUS

## 2013-08-18 MED ORDER — TECHNETIUM TC 99M SESTAMIBI GENERIC - CARDIOLITE
11.0000 | Freq: Once | INTRAVENOUS | Status: AC | PRN
  Administered 2013-08-18: 11 via INTRAVENOUS

## 2013-08-18 MED ORDER — REGADENOSON 0.4 MG/5ML IV SOLN
0.4000 mg | Freq: Once | INTRAVENOUS | Status: AC
  Administered 2013-08-18: 0.4 mg via INTRAVENOUS

## 2013-08-18 NOTE — Progress Notes (Signed)
[facility] SITE [address] NUCLEAR MED [address] [place], [place] [address] [phone]    Cardiology Nuclear Med Study  [name] is a 77 y.o. male     MRN : [id number]     DOB: 1936/03/20  Procedure Date: 08/18/2013  Nuclear Med Background Indication for Stress Test:  Evaluation for Ischemia and PTCA Patency History:  '91 Heart Catheterization:> PTCA;'10 MPS:EF=70%,no ischemia Cardiac Risk Factors: Family History - CAD, History of Smoking, Hypertension and Lipids  Symptoms:  Chest Tightness with Exertion (last date of chest discomfort 0630 today 1/10 relieved prior to stress test), Dizziness, DOE and Fatigue   Nuclear Pre-Procedure Caffeine/Decaff Intake:  None > 12 hrs NPO After: 7:00pm   Lungs:  clear O2 Sat: 98% on room air. IV 0.9% NS with Angio Cath:  20g  IV Site: R Antecubital x 1, tolerated well IV Started by:  Irean Hong, RN  Chest Size (in):  40 Cup Size: n/a  Height: 5' 8.5" (1.74 m)  Weight:  162 lb (73.483 kg)  BMI:  Body mass index is 24.27 kg/(m^2). Tech Comments:  n/a    Nuclear Med Study 1 or 2 day study: 1 day  Stress Test Type:  Lexiscan  Reading MD: Willa Rough, MD  Order Authorizing Provider:  Kristeen Miss, MD  Resting Radionuclide: Technetium 59m Sestamibi  Resting Radionuclide Dose: 11.0 mCi   Stress Radionuclide:  Technetium 42m Sestamibi  Stress Radionuclide Dose: 33.0 mCi           Stress Protocol Rest HR: 61 Stress HR: 97  Rest BP: 141/75 Stress BP: 155/76  Exercise Time (min): n/a METS: n/a   Predicted Max HR: 145 bpm % Max HR: 66.9 bpm Rate Pressure Product: 21308   Dose of Adenosine (mg):  n/a Dose of Lexiscan: 0.4 mg  Dose of Atropine (mg): n/a Dose of Dobutamine: n/a mcg/kg/min (at max HR)  Stress Test Technologist: Cathlyn Parsons, RN  Nuclear Technologist:  Doyne Keel, CNMT     Rest Procedure:  Myocardial perfusion imaging was performed at rest 45 minutes following the intravenous administration of  Technetium 59m Sestamibi. Rest ECG: Normal sinus rhythm with no diagnostic abnormality  Stress Procedure:  The patient received IV Lexiscan 0.4 mg over 15-seconds.  Technetium 66m Sestamibi injected at 30-seconds. Patient experienced transient chest tightness in recovery. Quantitative spect images were obtained after a 45 minute delay. Stress ECG: No significant change from baseline ECG  QPS Raw Data Images:  Normal; no motion artifact; normal heart/lung ratio. Stress Images:   There is a small/medium-sized area with mild to moderate decreased uptake at the base/mid/apical inferoseptal wall and at the apical. This is fixed Rest Images:  Small/medium-sized area with mild to moderate decreased uptake at the base/mid/apical inferoseptal wall and at the apical cap Subtraction (SDS):  No evidence of ischemia. Transient Ischemic Dilatation (Normal <1.22):  n/a Lung/Heart Ratio (Normal <0.45):  0.43  Quantitative Gated Spect Images QGS EDV:  95 ml QGS ESV:  36 ml  Impression Exercise Capacity:  Lexiscan with no exercise. BP Response:  Normal blood pressure response. Clinical Symptoms:  Mild chest tightness ECG Impression:  No significant ST segment change suggestive of ischemia. Comparison with Prior Nuclear Study:  Study is compared with the report of the study from November, 2010  Overall Impression:  This study shows a small/medium area of mild scar affecting the inferoseptal wall. There is no ischemia. There was no mention of this finding on the report of November,  2010. It is noted that today's study is done with a new camera and more sensitive imaging and processing. I cannot be sure if this is a real change or not. There is no definite ischemia. This is a low risk scan.  LV Ejection Fraction: 62%.  LV Wall Motion:  Slight decreased motion of the septum  Willa Rough, MD

## 2013-08-24 ENCOUNTER — Ambulatory Visit (INDEPENDENT_AMBULATORY_CARE_PROVIDER_SITE_OTHER): Payer: Medicare Other

## 2013-08-24 DIAGNOSIS — R351 Nocturia: Secondary | ICD-10-CM | POA: Diagnosis not present

## 2013-08-24 DIAGNOSIS — C61 Malignant neoplasm of prostate: Secondary | ICD-10-CM | POA: Diagnosis not present

## 2013-08-24 DIAGNOSIS — Z23 Encounter for immunization: Secondary | ICD-10-CM

## 2013-08-24 DIAGNOSIS — R339 Retention of urine, unspecified: Secondary | ICD-10-CM | POA: Diagnosis not present

## 2013-08-25 ENCOUNTER — Encounter (HOSPITAL_COMMUNITY): Payer: Self-pay | Admitting: Pharmacy Technician

## 2013-08-25 ENCOUNTER — Ambulatory Visit (INDEPENDENT_AMBULATORY_CARE_PROVIDER_SITE_OTHER): Payer: Medicare Other | Admitting: Cardiovascular Disease

## 2013-08-25 ENCOUNTER — Encounter: Payer: Self-pay | Admitting: Cardiovascular Disease

## 2013-08-25 ENCOUNTER — Encounter: Payer: Self-pay | Admitting: *Deleted

## 2013-08-25 VITALS — BP 124/60 | HR 72 | Ht 68.0 in | Wt 164.0 lb

## 2013-08-25 DIAGNOSIS — R9439 Abnormal result of other cardiovascular function study: Secondary | ICD-10-CM | POA: Diagnosis not present

## 2013-08-25 DIAGNOSIS — I2510 Atherosclerotic heart disease of native coronary artery without angina pectoris: Secondary | ICD-10-CM | POA: Diagnosis not present

## 2013-08-25 DIAGNOSIS — E785 Hyperlipidemia, unspecified: Secondary | ICD-10-CM

## 2013-08-25 LAB — BASIC METABOLIC PANEL
BUN: 21 mg/dL (ref 6–23)
CO2: 27 mEq/L (ref 19–32)
Calcium: 9.5 mg/dL (ref 8.4–10.5)
Chloride: 105 mEq/L (ref 96–112)
Creatinine, Ser: 1.6 mg/dL — ABNORMAL HIGH (ref 0.4–1.5)
GFR: 45.6 mL/min — ABNORMAL LOW (ref >60.00)
Glucose, Bld: 88 mg/dL (ref 70–99)
Potassium: 5.2 mEq/L — ABNORMAL HIGH (ref 3.5–5.1)
Sodium: 138 mEq/L (ref 135–145)

## 2013-08-25 LAB — CBC
HCT: 39.2 % (ref 39.0–52.0)
Hemoglobin: 13.2 g/dL (ref 13.0–17.0)
MCHC: 33.8 g/dL (ref 30.0–36.0)
MCV: 100.3 fl — ABNORMAL HIGH (ref 78.0–100.0)
Platelets: 159 10*3/uL (ref 150.0–400.0)
RBC: 3.91 Mil/uL — ABNORMAL LOW (ref 4.22–5.81)
RDW: 13.1 % (ref 11.5–14.6)
WBC: 5.7 10*3/uL (ref 4.5–10.5)

## 2013-08-25 LAB — PROTIME-INR
INR: 1.1 ratio — ABNORMAL HIGH (ref 0.8–1.0)
Prothrombin Time: 11.3 s (ref 10.2–12.4)

## 2013-08-25 NOTE — Assessment & Plan Note (Signed)
[name] presents today with worsening unstable angina. He has class 2-3 angina symptoms. He's had these episodes of angina fairly reliably for the past several weeks.  he's had a stress Myoview study which revealed a fixed inferior apical defect.  He has a history of coronary artery disease with PTCA in the past by Dr. Deborah Chalk.

## 2013-08-25 NOTE — Patient Instructions (Addendum)
Your physician has requested that you have a cardiac catheterization. Cardiac catheterization is used to diagnose and/or treat various heart conditions. Doctors may recommend this procedure for a number of different reasons. The most common reason is to evaluate chest pain. Chest pain can be a symptom of coronary artery disease (CAD), and cardiac catheterization can show whether plaque is narrowing or blocking your heart's arteries. This procedure is also used to evaluate the valves, as well as measure the blood flow and oxygen levels in different parts of your heart.  Please follow instruction sheet, as given.   Your physician recommends that you return for lab work in: TODAY BMET CBC PT/INR

## 2013-08-25 NOTE — Progress Notes (Signed)
[name] Date of Birth  1936/11/18 [name]     [name]  [address]    [address]   [address] [place], [place]  [address]    [place], [place]  [address] [phone]  Fax  [phone]  [phone]  Fax [phone]  Problem list: 1. Coronary artery disease- status post remote PTCA to the left complex artery in 1991 2. Hypertension 3. Hyperlipidemia 4. Prostate Cancer.  History of Present Illness:  [name] is  a 77 year old gentleman with a history of PTCA approximately 22 years ago.  He's not had any episodes of chest pain or shortness of breath. He stays fairly active although he doesn't exercise at the gym.  He has some problems with some arthritis-type pain in his right hip.  The Lipitor causes a little bit of muscular skeletal pain.  May 10, 2013:  He stopped hib Benicar 2 months ago. His BP has been running low at home.  He has been on a new medicine for his prostate and was concerned that his BP would run too low.   He has been taking a varied dose of benicar - 1 tab, 1/2 tab, 1/4 tablet whenever he thinks he needs it.   Sept. 5, 2014:  [name] presents today for worsening shortness of breath and chest pain.  Last Saturday, he mowed the lawn and spread 200 lbs of lime.  He developed severe CP and started to take some NTG.  The pain finally eased up. Several days later, he had recurrent CP while cleaning out the gutters.    He thinks he was just overheated.   He has been recording his BP and has been getting some low readings.     He has been having some hip pain and does not know if he will be able to walk on the treadmill.   Sept. 24, 2014: [name] was seen recently with some worsening chest pain. He had a stress Myoview study which was interpreted as low risk he has a fixed inferoapical defect.  He exertion, he is still having angina.  Lasts 5 minutes. In the center of his chest. No radiatioin.  Occurs only with exertion ( stating  mower or Rototiller- never walking around the house.)   Class II - III angina.    Yesterday, he had some mild baseline CP which worsened while walking around the grocery store.    Current Outpatient Prescriptions on File Prior to Visit  Medication Sig Dispense Refill  . aspirin 81 MG tablet Take 81 mg by mouth daily.        [name] atorvastatin (LIPITOR) 40 MG tablet TAKE 1 TABLET DAILY  90 tablet  0  . docusate sodium (COLACE) 100 MG capsule Take 100 mg by mouth 2 (two) times daily.      [name] olmesartan (BENICAR) 20 MG tablet Take 1 tablet (20 mg total) by mouth daily.  90 tablet  3  . pantoprazole (PROTONIX) 40 MG tablet Take 40 mg by mouth as needed.      . polyethylene glycol (MIRALAX / GLYCOLAX) packet Take 17 g by mouth as needed.      . Tamsulosin HCl (FLOMAX) 0.4 MG CAPS Take 0.4 mg by mouth daily.        No current facility-administered medications on file prior to visit.    Allergies  Allergen Reactions  . Bee Venom   . Hydrocodone     REACTION: disorientation  . Amoxicillin Rash  Past Medical History  Diagnosis Date  . HYPERLIPIDEMIA 04/06/2010  . BENIGN POSITIONAL VERTIGO 04/06/2010  . HYPERTENSION 04/06/2010  . CAD 04/06/2010  . GERD 04/06/2010  . SKIN LESION 04/06/2010  . Acute sinusitis, unspecified 11/27/2010  . Coronary artery disease 2006    angioplasty  . Prostate cancer 09/06/05 dx    adenocarcinoma gleason=3+4=7,pSA=<0.01-0.13-212  . Sleep apnea   . Anal fissure   . Allergy     bee sting,,amoxicillin, hydrocodone  . Joint pain   . OSTEOARTHRITIS, GENERALIZED 04/06/2010  . History of radiation therapy 08/24/12-10/22/12    prostate 6600cGy/33 sessions  . B12 deficiency     Past Surgical History  Procedure Laterality Date  . Angioplasty  1991  . Esophagas dilatation  1992  . Hernia repair  1994    b/l groin area  . Prostate surgery  11/18/05    prostatectomy, adenocarcinoma radical  . Back surgery    . Knee surgery      right arthoscopic    History  Smoking  status  . Former Smoker -- 2.00 packs/day for 10 years  . Types: Cigarettes  . Quit date: 12/03/1994  Smokeless tobacco  . Not on file    History  Alcohol Use  . Yes    Comment: occasionally    Family History  Problem Relation Age of Onset  . Cancer Father     prostate died 29 something  . Arthritis Other   . Hyperlipidemia Other   . Hypertension Other   . Cancer Brother     prostate cancer    Reviw of Systems:  Reviewed in the HPI.  All other systems are negative.  Physical Exam: Blood pressure 124/60, pulse 72, height 5\' 8"  (1.727 m), weight 164 lb (74.39 kg). General: Well developed, well nourished, in no acute distress.  Head: Normocephalic, atraumatic, sclera non-icteric, mucus membranes are moist,   Neck: Supple. Negative for carotid bruits. JVD not elevated.  Lungs: Clear bilaterally to auscultation without wheezes, rales, or rhonchi. Breathing is unlabored.  Heart: RRR with S1 S2. No murmurs, rubs, or gallops appreciated.  Abdomen: Soft, non-tender, non-distended with normoactive bowel sounds. No hepatomegaly. No rebound/guarding. No obvious abdominal masses.  Msk:  Strength and tone appear normal for age.  Extremities: No clubbing or cyanosis. No edema.  Distal pedal pulses are 2+ and equal bilaterally.  Neuro: Alert and oriented X 3. Moves all extremities spontaneously.  Psych:  Responds to questions appropriately with a normal affect.  ECG: Sept. 5, 2014:  Sinus brady at 39.  LAE, no ST or T wave abnormalities  Assessment / Plan:

## 2013-08-25 NOTE — Assessment & Plan Note (Signed)
Continue atorvastatin for now. 

## 2013-08-27 ENCOUNTER — Other Ambulatory Visit: Payer: Self-pay

## 2013-08-27 ENCOUNTER — Inpatient Hospital Stay (HOSPITAL_COMMUNITY): Payer: Medicare Other

## 2013-08-27 ENCOUNTER — Inpatient Hospital Stay (HOSPITAL_COMMUNITY)
Admission: RE | Admit: 2013-08-27 | Discharge: 2013-09-02 | DRG: 234 | Disposition: A | Discharge status: Home / Self Care | Payer: Medicare Other | Source: Ambulatory Visit | Attending: Thoracic Surgery (Cardiothoracic Vascular Surgery) | Admitting: Thoracic Surgery (Cardiothoracic Vascular Surgery)

## 2013-08-27 ENCOUNTER — Encounter (HOSPITAL_COMMUNITY): Payer: Self-pay | Admitting: Certified Registered"

## 2013-08-27 ENCOUNTER — Encounter (HOSPITAL_COMMUNITY): Payer: Self-pay | Admitting: Certified Registered Nurse Anesthetist

## 2013-08-27 ENCOUNTER — Encounter (HOSPITAL_COMMUNITY): Payer: Medicare Other

## 2013-08-27 ENCOUNTER — Encounter (HOSPITAL_COMMUNITY)
Admission: RE | Disposition: A | Discharge status: Home / Self Care | Payer: Self-pay | Source: Ambulatory Visit | Attending: Thoracic Surgery (Cardiothoracic Vascular Surgery)

## 2013-08-27 ENCOUNTER — Inpatient Hospital Stay (HOSPITAL_COMMUNITY): Payer: Medicare Other | Admitting: Certified Registered"

## 2013-08-27 ENCOUNTER — Encounter (HOSPITAL_COMMUNITY)
Admission: RE | Disposition: A | Discharge status: Home / Self Care | Payer: Medicare Other | Source: Ambulatory Visit | Attending: Thoracic Surgery (Cardiothoracic Vascular Surgery)

## 2013-08-27 DIAGNOSIS — D62 Acute posthemorrhagic anemia: Secondary | ICD-10-CM | POA: Diagnosis not present

## 2013-08-27 DIAGNOSIS — D696 Thrombocytopenia, unspecified: Secondary | ICD-10-CM | POA: Diagnosis not present

## 2013-08-27 DIAGNOSIS — Z8546 Personal history of malignant neoplasm of prostate: Secondary | ICD-10-CM

## 2013-08-27 DIAGNOSIS — I2510 Atherosclerotic heart disease of native coronary artery without angina pectoris: Secondary | ICD-10-CM | POA: Diagnosis not present

## 2013-08-27 DIAGNOSIS — I498 Other specified cardiac arrhythmias: Secondary | ICD-10-CM | POA: Diagnosis not present

## 2013-08-27 DIAGNOSIS — E785 Hyperlipidemia, unspecified: Secondary | ICD-10-CM

## 2013-08-27 DIAGNOSIS — Z91038 Other insect allergy status: Secondary | ICD-10-CM

## 2013-08-27 DIAGNOSIS — G4730 Sleep apnea, unspecified: Secondary | ICD-10-CM | POA: Diagnosis present

## 2013-08-27 DIAGNOSIS — J90 Pleural effusion, not elsewhere classified: Secondary | ICD-10-CM | POA: Diagnosis not present

## 2013-08-27 DIAGNOSIS — H8110 Benign paroxysmal vertigo, unspecified ear: Secondary | ICD-10-CM | POA: Diagnosis present

## 2013-08-27 DIAGNOSIS — Z888 Allergy status to other drugs, medicaments and biological substances status: Secondary | ICD-10-CM

## 2013-08-27 DIAGNOSIS — M159 Polyosteoarthritis, unspecified: Secondary | ICD-10-CM | POA: Diagnosis present

## 2013-08-27 DIAGNOSIS — Z87891 Personal history of nicotine dependence: Secondary | ICD-10-CM

## 2013-08-27 DIAGNOSIS — Z923 Personal history of irradiation: Secondary | ICD-10-CM

## 2013-08-27 DIAGNOSIS — Z9079 Acquired absence of other genital organ(s): Secondary | ICD-10-CM

## 2013-08-27 DIAGNOSIS — Z951 Presence of aortocoronary bypass graft: Secondary | ICD-10-CM

## 2013-08-27 DIAGNOSIS — Z9861 Coronary angioplasty status: Secondary | ICD-10-CM

## 2013-08-27 DIAGNOSIS — J9819 Other pulmonary collapse: Secondary | ICD-10-CM | POA: Diagnosis not present

## 2013-08-27 DIAGNOSIS — I10 Essential (primary) hypertension: Secondary | ICD-10-CM | POA: Diagnosis not present

## 2013-08-27 DIAGNOSIS — C61 Malignant neoplasm of prostate: Secondary | ICD-10-CM

## 2013-08-27 DIAGNOSIS — Z8261 Family history of arthritis: Secondary | ICD-10-CM

## 2013-08-27 DIAGNOSIS — Z8249 Family history of ischemic heart disease and other diseases of the circulatory system: Secondary | ICD-10-CM

## 2013-08-27 DIAGNOSIS — K219 Gastro-esophageal reflux disease without esophagitis: Secondary | ICD-10-CM | POA: Diagnosis present

## 2013-08-27 DIAGNOSIS — R110 Nausea: Secondary | ICD-10-CM | POA: Diagnosis not present

## 2013-08-27 DIAGNOSIS — I959 Hypotension, unspecified: Secondary | ICD-10-CM | POA: Diagnosis not present

## 2013-08-27 DIAGNOSIS — Z881 Allergy status to other antibiotic agents status: Secondary | ICD-10-CM

## 2013-08-27 DIAGNOSIS — Z8042 Family history of malignant neoplasm of prostate: Secondary | ICD-10-CM | POA: Diagnosis not present

## 2013-08-27 DIAGNOSIS — M25559 Pain in unspecified hip: Secondary | ICD-10-CM | POA: Diagnosis present

## 2013-08-27 HISTORY — DX: Presence of aortocoronary bypass graft: Z95.1

## 2013-08-27 HISTORY — PX: LEFT HEART CATHETERIZATION WITH CORONARY ANGIOGRAM: SHX5451

## 2013-08-27 HISTORY — PX: CORONARY ARTERY BYPASS GRAFT: SHX141

## 2013-08-27 LAB — CBC
HCT: 25.5 % — ABNORMAL LOW (ref 39.0–52.0)
Hemoglobin: 8.9 g/dL — ABNORMAL LOW (ref 13.0–17.0)
MCH: 34.2 pg — ABNORMAL HIGH (ref 26.0–34.0)
MCHC: 34.9 g/dL (ref 30.0–36.0)
MCV: 98.1 fL (ref 78.0–100.0)
Platelets: 74 10*3/uL — ABNORMAL LOW (ref 150–400)
RBC: 2.6 MIL/uL — ABNORMAL LOW (ref 4.22–5.81)
RDW: 12.6 % (ref 11.5–15.5)
WBC: 5.7 10*3/uL (ref 4.0–10.5)

## 2013-08-27 LAB — POCT I-STAT 4, (NA,K, GLUC, HGB,HCT)
Glucose, Bld: 97 mg/dL (ref 70–99)
Glucose, Bld: 100 mg/dL — ABNORMAL HIGH (ref 70–99)
Glucose, Bld: 109 mg/dL — ABNORMAL HIGH (ref 70–99)
Glucose, Bld: 118 mg/dL — ABNORMAL HIGH (ref 70–99)
Glucose, Bld: 105 mg/dL — ABNORMAL HIGH (ref 70–99)
HCT: 33 % — ABNORMAL LOW (ref 39.0–52.0)
HCT: 23 % — ABNORMAL LOW (ref 39.0–52.0)
HCT: 30 % — ABNORMAL LOW (ref 39.0–52.0)
HCT: 25 % — ABNORMAL LOW (ref 39.0–52.0)
HCT: 24 % — ABNORMAL LOW (ref 39.0–52.0)
Hemoglobin: 11.2 g/dL — ABNORMAL LOW (ref 13.0–17.0)
Hemoglobin: 10.2 g/dL — ABNORMAL LOW (ref 13.0–17.0)
Hemoglobin: 7.8 g/dL — ABNORMAL LOW (ref 13.0–17.0)
Hemoglobin: 8.5 g/dL — ABNORMAL LOW (ref 13.0–17.0)
Hemoglobin: 8.2 g/dL — ABNORMAL LOW (ref 13.0–17.0)
Potassium: 4.7 mEq/L (ref 3.5–5.1)
Potassium: 4.4 mEq/L (ref 3.5–5.1)
Potassium: 4.4 mEq/L (ref 3.5–5.1)
Potassium: 5.4 mEq/L — ABNORMAL HIGH (ref 3.5–5.1)
Potassium: 4.5 mEq/L (ref 3.5–5.1)
Sodium: 138 mEq/L (ref 135–145)
Sodium: 135 mEq/L (ref 135–145)
Sodium: 138 mEq/L (ref 135–145)
Sodium: 136 mEq/L (ref 135–145)
Sodium: 138 mEq/L (ref 135–145)

## 2013-08-27 LAB — GLUCOSE, CAPILLARY
Glucose-Capillary: 127 mg/dL — ABNORMAL HIGH (ref 70–99)
Glucose-Capillary: 51 mg/dL — ABNORMAL LOW (ref 70–99)
Glucose-Capillary: 111 mg/dL — ABNORMAL HIGH (ref 70–99)
Glucose-Capillary: 108 mg/dL — ABNORMAL HIGH (ref 70–99)
Glucose-Capillary: 111 mg/dL — ABNORMAL HIGH (ref 70–99)

## 2013-08-27 LAB — APTT: aPTT: 41 seconds — ABNORMAL HIGH (ref 24–37)

## 2013-08-27 LAB — POCT I-STAT 3, ART BLOOD GAS (G3+)
Acid-base deficit: 2 mmol/L (ref 0.0–2.0)
Acid-base deficit: 1 mmol/L (ref 0.0–2.0)
Bicarbonate: 22.1 mEq/L (ref 20.0–24.0)
Bicarbonate: 22.9 mEq/L (ref 20.0–24.0)
O2 Saturation: 100 %
O2 Saturation: 100 %
TCO2: 23 mmol/L (ref 0–100)
TCO2: 24 mmol/L (ref 0–100)
pCO2 arterial: 34 mmHg — ABNORMAL LOW (ref 35.0–45.0)
pCO2 arterial: 33.4 mmHg — ABNORMAL LOW (ref 35.0–45.0)
pH, Arterial: 7.42 (ref 7.350–7.450)
pH, Arterial: 7.444 (ref 7.350–7.450)
pO2, Arterial: 363 mmHg — ABNORMAL HIGH (ref 80.0–100.0)
pO2, Arterial: 364 mmHg — ABNORMAL HIGH (ref 80.0–100.0)

## 2013-08-27 LAB — HEMOGLOBIN AND HEMATOCRIT, BLOOD
HCT: 25.1 % — ABNORMAL LOW (ref 39.0–52.0)
Hemoglobin: 9 g/dL — ABNORMAL LOW (ref 13.0–17.0)

## 2013-08-27 LAB — PROTIME-INR
INR: 1.7 — ABNORMAL HIGH (ref 0.00–1.49)
Prothrombin Time: 19.5 seconds — ABNORMAL HIGH (ref 11.6–15.2)

## 2013-08-27 LAB — ABO/RH: ABO/RH(D): AB POS

## 2013-08-27 LAB — PLATELET COUNT: Platelets: 97 10*3/uL — ABNORMAL LOW (ref 150–400)

## 2013-08-27 LAB — MRSA PCR SCREENING: MRSA by PCR: NEGATIVE

## 2013-08-27 SURGERY — CORONARY ARTERY BYPASS GRAFTING (CABG)
Anesthesia: General | Site: Chest | Wound class: Clean

## 2013-08-27 SURGERY — LEFT HEART CATHETERIZATION WITH CORONARY ANGIOGRAM
Anesthesia: LOCAL

## 2013-08-27 MED ORDER — SODIUM CHLORIDE 0.9 % IV SOLN
100.0000 [IU] | INTRAVENOUS | Status: DC | PRN
  Administered 2013-08-27: 1.1 [IU]/h via INTRAVENOUS
  Administered 2013-08-27: 1 [IU]/h via INTRAVENOUS

## 2013-08-27 MED ORDER — DOPAMINE-DEXTROSE 3.2-5 MG/ML-% IV SOLN
3.0000 ug/kg/min | INTRAVENOUS | Status: DC
  Administered 2013-08-27: 3 ug/kg/min via INTRAVENOUS

## 2013-08-27 MED ORDER — DEXTROSE 5 % IV SOLN
1.5000 g | INTRAVENOUS | Status: DC
  Filled 2013-08-27: qty 1.5

## 2013-08-27 MED ORDER — VANCOMYCIN HCL IN DEXTROSE 1-5 GM/200ML-% IV SOLN
1000.0000 mg | Freq: Once | INTRAVENOUS | Status: AC
  Administered 2013-08-28: 1000 mg via INTRAVENOUS
  Filled 2013-08-27: qty 200

## 2013-08-27 MED ORDER — METOPROLOL TARTRATE 25 MG/10 ML ORAL SUSPENSION
12.5000 mg | Freq: Two times a day (BID) | ORAL | Status: DC
  Administered 2013-08-28: 12.5 mg
  Filled 2013-08-27 (×9): qty 5

## 2013-08-27 MED ORDER — ACETAMINOPHEN 650 MG RE SUPP
650.0000 mg | Freq: Once | RECTAL | Status: AC
  Administered 2013-08-27: 650 mg via RECTAL

## 2013-08-27 MED ORDER — LACTATED RINGERS IV SOLN
INTRAVENOUS | Status: DC | PRN
  Administered 2013-08-27 (×2): via INTRAVENOUS

## 2013-08-27 MED ORDER — METOPROLOL TARTRATE 1 MG/ML IV SOLN: 2.5000 mg | INTRAVENOUS | Status: DC | PRN

## 2013-08-27 MED ORDER — DEXMEDETOMIDINE HCL IN NACL 400 MCG/100ML IV SOLN
0.1000 ug/kg/h | INTRAVENOUS | Status: DC
  Filled 2013-08-27: qty 100

## 2013-08-27 MED ORDER — LACTATED RINGERS IV SOLN
INTRAVENOUS | Status: DC | PRN
  Administered 2013-08-27: 12:00:00 via INTRAVENOUS

## 2013-08-27 MED ORDER — DEXTROSE 50 % IV SOLN
20.0000 mL | Freq: Once | INTRAVENOUS | Status: AC
  Administered 2013-08-27: 20 mL via INTRAVENOUS

## 2013-08-27 MED ORDER — EPHEDRINE SULFATE 50 MG/ML IJ SOLN
INTRAMUSCULAR | Status: DC | PRN
  Administered 2013-08-27 (×2): 5 mg via INTRAVENOUS

## 2013-08-27 MED ORDER — FAMOTIDINE IN NACL 20-0.9 MG/50ML-% IV SOLN
20.0000 mg | Freq: Two times a day (BID) | INTRAVENOUS | Status: AC
  Administered 2013-08-27 – 2013-08-28 (×2): 20 mg via INTRAVENOUS
  Filled 2013-08-27: qty 50

## 2013-08-27 MED ORDER — ACETAMINOPHEN 325 MG PO TABS: 650.0000 mg | ORAL_TABLET | ORAL | Status: DC | PRN

## 2013-08-27 MED ORDER — PLASMA-LYTE 148 IV SOLN
INTRAVENOUS | Status: DC
  Filled 2013-08-27 (×3): qty 2.5

## 2013-08-27 MED ORDER — VANCOMYCIN HCL 10 G IV SOLR
1250.0000 mg | INTRAVENOUS | Status: DC
  Filled 2013-08-27: qty 1250

## 2013-08-27 MED ORDER — SODIUM CHLORIDE 0.9 % IV SOLN
10.0000 g | INTRAVENOUS | Status: DC | PRN
  Administered 2013-08-27: 5 g/h via INTRAVENOUS

## 2013-08-27 MED ORDER — DEXTROSE 5 % IV SOLN
750.0000 mg | INTRAVENOUS | Status: DC
  Filled 2013-08-27 (×2): qty 750

## 2013-08-27 MED ORDER — EPINEPHRINE HCL 1 MG/ML IJ SOLN
0.5000 ug/min | INTRAVENOUS | Status: DC
  Filled 2013-08-27 (×2): qty 4

## 2013-08-27 MED ORDER — BISACODYL 10 MG RE SUPP: 10.0000 mg | Freq: Every day | RECTAL | Status: DC

## 2013-08-27 MED ORDER — ACETAMINOPHEN 160 MG/5ML PO SOLN
1000.0000 mg | Freq: Four times a day (QID) | ORAL | Status: AC
  Administered 2013-08-27: 1000 mg
  Filled 2013-08-27: qty 40.6

## 2013-08-27 MED ORDER — MAGNESIUM SULFATE 40 MG/ML IJ SOLN
4.0000 g | Freq: Once | INTRAMUSCULAR | Status: AC
  Administered 2013-08-27: 4 g via INTRAVENOUS
  Filled 2013-08-27: qty 100

## 2013-08-27 MED ORDER — HEMOSTATIC AGENTS (NO CHARGE) OPTIME
TOPICAL | Status: DC | PRN
  Administered 2013-08-27: 1 via TOPICAL

## 2013-08-27 MED ORDER — SODIUM CHLORIDE 0.9 % IV SOLN: 250.0000 mL | INTRAVENOUS | Status: DC | PRN

## 2013-08-27 MED ORDER — ALBUMIN HUMAN 5 % IV SOLN
INTRAVENOUS | Status: DC | PRN
  Administered 2013-08-27: 16:00:00 via INTRAVENOUS

## 2013-08-27 MED ORDER — ARTIFICIAL TEARS OP OINT
TOPICAL_OINTMENT | OPHTHALMIC | Status: DC | PRN
  Administered 2013-08-27: 1 via OPHTHALMIC

## 2013-08-27 MED ORDER — MORPHINE SULFATE 2 MG/ML IJ SOLN
2.0000 mg | INTRAMUSCULAR | Status: DC | PRN
  Administered 2013-08-28: 4 mg via INTRAVENOUS
  Filled 2013-08-27 (×2): qty 2

## 2013-08-27 MED ORDER — ALBUMIN HUMAN 5 % IV SOLN
250.0000 mL | INTRAVENOUS | Status: AC | PRN
  Administered 2013-08-27 (×3): 250 mL via INTRAVENOUS
  Filled 2013-08-27 (×2): qty 250

## 2013-08-27 MED ORDER — PHENYLEPHRINE HCL 10 MG/ML IJ SOLN
0.0000 ug/min | INTRAVENOUS | Status: DC
  Filled 2013-08-27: qty 2

## 2013-08-27 MED ORDER — ASPIRIN 81 MG PO CHEW: 324.0000 mg | CHEWABLE_TABLET | ORAL | Status: DC

## 2013-08-27 MED ORDER — PROTAMINE SULFATE 10 MG/ML IV SOLN
INTRAVENOUS | Status: DC | PRN
  Administered 2013-08-27: 100 mg via INTRAVENOUS
  Administered 2013-08-27: 20 mg via INTRAVENOUS
  Administered 2013-08-27: 100 mg via INTRAVENOUS

## 2013-08-27 MED ORDER — NITROGLYCERIN 0.2 MG/ML ON CALL CATH LAB
INTRAVENOUS | Status: AC
  Filled 2013-08-27: qty 1

## 2013-08-27 MED ORDER — ONDANSETRON HCL 4 MG/2ML IJ SOLN
4.0000 mg | Freq: Four times a day (QID) | INTRAMUSCULAR | Status: DC | PRN
  Administered 2013-08-28: 4 mg via INTRAVENOUS
  Filled 2013-08-27: qty 2

## 2013-08-27 MED ORDER — DOPAMINE-DEXTROSE 3.2-5 MG/ML-% IV SOLN
2.0000 ug/kg/min | INTRAVENOUS | Status: DC
  Filled 2013-08-27: qty 250

## 2013-08-27 MED ORDER — BISACODYL 5 MG PO TBEC
10.0000 mg | DELAYED_RELEASE_TABLET | Freq: Every day | ORAL | Status: DC
  Administered 2013-08-28 – 2013-09-01 (×5): 10 mg via ORAL
  Filled 2013-08-27 (×4): qty 2

## 2013-08-27 MED ORDER — HEPARIN SODIUM (PORCINE) 1000 UNIT/ML IJ SOLN
INTRAMUSCULAR | Status: AC
  Filled 2013-08-27: qty 1

## 2013-08-27 MED ORDER — ACETAMINOPHEN 500 MG PO TABS
1000.0000 mg | ORAL_TABLET | Freq: Four times a day (QID) | ORAL | Status: AC
Start: 2013-08-28 — End: 2013-09-01
  Administered 2013-08-28 – 2013-09-01 (×17): 1000 mg via ORAL
  Filled 2013-08-27 (×20): qty 2

## 2013-08-27 MED ORDER — DEXMEDETOMIDINE HCL IN NACL 200 MCG/50ML IV SOLN
0.1000 ug/kg/h | INTRAVENOUS | Status: DC
  Administered 2013-08-27: 0.7 ug/kg/h via INTRAVENOUS
  Filled 2013-08-27: qty 50

## 2013-08-27 MED ORDER — FENTANYL CITRATE 0.05 MG/ML IJ SOLN
INTRAMUSCULAR | Status: AC
  Filled 2013-08-27: qty 2

## 2013-08-27 MED ORDER — SODIUM CHLORIDE 0.9 % IV SOLN
1.0000 mL/kg/h | INTRAVENOUS | Status: DC
  Administered 2013-08-27: 1 mL/kg/h via INTRAVENOUS

## 2013-08-27 MED ORDER — HEPARIN SODIUM (PORCINE) 1000 UNIT/ML IJ SOLN
INTRAMUSCULAR | Status: DC | PRN
  Administered 2013-08-27: 2000 [IU] via INTRAVENOUS
  Administered 2013-08-27: 18000 [IU] via INTRAVENOUS

## 2013-08-27 MED ORDER — DEXTROSE 50 % IV SOLN
INTRAVENOUS | Status: AC
  Administered 2013-08-27: 20 mL via INTRAVENOUS
  Filled 2013-08-27: qty 50

## 2013-08-27 MED ORDER — PHENYLEPHRINE HCL 10 MG/ML IJ SOLN
30.0000 ug/min | INTRAVENOUS | Status: DC
  Filled 2013-08-27 (×2): qty 2

## 2013-08-27 MED ORDER — PANTOPRAZOLE SODIUM 40 MG PO TBEC
40.0000 mg | DELAYED_RELEASE_TABLET | Freq: Every day | ORAL | Status: DC
  Administered 2013-08-29 – 2013-09-02 (×5): 40 mg via ORAL
  Filled 2013-08-27 (×6): qty 1

## 2013-08-27 MED ORDER — SODIUM CHLORIDE 0.45 % IV SOLN
INTRAVENOUS | Status: DC
  Administered 2013-08-27: 18:00:00 via INTRAVENOUS

## 2013-08-27 MED ORDER — MIDAZOLAM HCL 5 MG/5ML IJ SOLN
INTRAMUSCULAR | Status: DC | PRN
  Administered 2013-08-27: 4 mg via INTRAVENOUS
  Administered 2013-08-27: 5 mg via INTRAVENOUS
  Administered 2013-08-27: 4 mg via INTRAVENOUS
  Administered 2013-08-27: 2 mg via INTRAVENOUS
  Administered 2013-08-27: 5 mg via INTRAVENOUS

## 2013-08-27 MED ORDER — LEVOFLOXACIN IN D5W 500 MG/100ML IV SOLN
500.0000 mg | INTRAVENOUS | Status: AC
  Administered 2013-08-27: 500 mg via INTRAVENOUS
  Filled 2013-08-27 (×2): qty 100

## 2013-08-27 MED ORDER — INSULIN REGULAR BOLUS VIA INFUSION
0.0000 [IU] | Freq: Three times a day (TID) | INTRAVENOUS | Status: DC
  Filled 2013-08-27: qty 10

## 2013-08-27 MED ORDER — NITROGLYCERIN IN D5W 200-5 MCG/ML-% IV SOLN: 0.0000 ug/min | INTRAVENOUS | Status: DC

## 2013-08-27 MED ORDER — DOCUSATE SODIUM 100 MG PO CAPS
200.0000 mg | ORAL_CAPSULE | Freq: Every day | ORAL | Status: DC
  Administered 2013-08-28 – 2013-09-01 (×5): 200 mg via ORAL
  Filled 2013-08-27 (×5): qty 2

## 2013-08-27 MED ORDER — TAMSULOSIN HCL 0.4 MG PO CAPS
0.4000 mg | ORAL_CAPSULE | Freq: Every day | ORAL | Status: DC
  Administered 2013-08-28 – 2013-09-02 (×6): 0.4 mg via ORAL
  Filled 2013-08-27 (×8): qty 1

## 2013-08-27 MED ORDER — INSULIN ASPART 100 UNIT/ML ~~LOC~~ SOLN
0.0000 [IU] | SUBCUTANEOUS | Status: DC
  Administered 2013-08-28 (×2): 2 [IU] via SUBCUTANEOUS

## 2013-08-27 MED ORDER — SODIUM CHLORIDE 0.9 % IJ SOLN: 3.0000 mL | INTRAMUSCULAR | Status: DC | PRN

## 2013-08-27 MED ORDER — SODIUM CHLORIDE 0.9 % IV SOLN: 250.0000 mL | INTRAVENOUS | Status: DC

## 2013-08-27 MED ORDER — DEXMEDETOMIDINE HCL IN NACL 200 MCG/50ML IV SOLN
INTRAVENOUS | Status: DC | PRN
  Administered 2013-08-27: 0.2 ug/kg/h via INTRAVENOUS

## 2013-08-27 MED ORDER — LIDOCAINE HCL (PF) 1 % IJ SOLN
INTRAMUSCULAR | Status: AC
  Filled 2013-08-27: qty 30

## 2013-08-27 MED ORDER — POTASSIUM CHLORIDE 10 MEQ/50ML IV SOLN
10.0000 meq | INTRAVENOUS | Status: AC
  Administered 2013-08-27 (×3): 10 meq via INTRAVENOUS

## 2013-08-27 MED ORDER — MIDAZOLAM HCL 2 MG/2ML IJ SOLN
INTRAMUSCULAR | Status: AC
  Filled 2013-08-27: qty 2

## 2013-08-27 MED ORDER — METOPROLOL TARTRATE 12.5 MG HALF TABLET
12.5000 mg | ORAL_TABLET | Freq: Two times a day (BID) | ORAL | Status: DC
  Administered 2013-08-30 (×2): 12.5 mg via ORAL
  Filled 2013-08-27 (×9): qty 1

## 2013-08-27 MED ORDER — SODIUM CHLORIDE 0.9 % IV SOLN
INTRAVENOUS | Status: DC
  Filled 2013-08-27 (×2): qty 30

## 2013-08-27 MED ORDER — ASPIRIN 81 MG PO CHEW
CHEWABLE_TABLET | ORAL | Status: AC
  Filled 2013-08-27: qty 4

## 2013-08-27 MED ORDER — SODIUM CHLORIDE 0.9 % IV SOLN
INTRAVENOUS | Status: DC
  Administered 2013-08-27: 18:00:00 via INTRAVENOUS
  Administered 2013-08-28: 20 mL via INTRAVENOUS
  Administered 2013-08-30: 15:00:00 via INTRAVENOUS

## 2013-08-27 MED ORDER — SODIUM CHLORIDE 0.9 % IV SOLN
INTRAVENOUS | Status: DC
  Filled 2013-08-27 (×2): qty 40

## 2013-08-27 MED ORDER — POTASSIUM CHLORIDE 2 MEQ/ML IV SOLN
80.0000 meq | INTRAVENOUS | Status: DC
  Filled 2013-08-27 (×2): qty 40

## 2013-08-27 MED ORDER — ATORVASTATIN CALCIUM 40 MG PO TABS
40.0000 mg | ORAL_TABLET | Freq: Every day | ORAL | Status: DC
  Administered 2013-08-28 – 2013-09-02 (×6): 40 mg via ORAL
  Filled 2013-08-27 (×6): qty 1

## 2013-08-27 MED ORDER — SODIUM CHLORIDE 0.9 % IV SOLN: 1.0000 mL/kg/h | INTRAVENOUS | Status: AC

## 2013-08-27 MED ORDER — NITROGLYCERIN IN D5W 200-5 MCG/ML-% IV SOLN
2.0000 ug/min | INTRAVENOUS | Status: DC
  Filled 2013-08-27: qty 250

## 2013-08-27 MED ORDER — SODIUM CHLORIDE 0.9 % IV SOLN
INTRAVENOUS | Status: DC
  Filled 2013-08-27: qty 1

## 2013-08-27 MED ORDER — LACTATED RINGERS IV SOLN: 500.0000 mL | Freq: Once | INTRAVENOUS | Status: AC | PRN

## 2013-08-27 MED ORDER — ASPIRIN 81 MG PO CHEW: 324.0000 mg | CHEWABLE_TABLET | Freq: Every day | ORAL | Status: DC

## 2013-08-27 MED ORDER — VERAPAMIL HCL 2.5 MG/ML IV SOLN
INTRAVENOUS | Status: AC
  Filled 2013-08-27: qty 2

## 2013-08-27 MED ORDER — NITROGLYCERIN IN D5W 200-5 MCG/ML-% IV SOLN
INTRAVENOUS | Status: DC | PRN
  Administered 2013-08-27: 16.6 ug/min via INTRAVENOUS

## 2013-08-27 MED ORDER — LACTATED RINGERS IV SOLN
INTRAVENOUS | Status: DC | PRN
  Administered 2013-08-27: 13:00:00 via INTRAVENOUS

## 2013-08-27 MED ORDER — SODIUM CHLORIDE 0.9 % IJ SOLN: 3.0000 mL | Freq: Two times a day (BID) | INTRAMUSCULAR | Status: DC

## 2013-08-27 MED ORDER — ACETAMINOPHEN 160 MG/5ML PO SOLN: 650.0000 mg | Freq: Once | ORAL | Status: AC

## 2013-08-27 MED ORDER — LACTATED RINGERS IV SOLN: INTRAVENOUS | Status: DC

## 2013-08-27 MED ORDER — PROPOFOL 10 MG/ML IV BOLUS
INTRAVENOUS | Status: DC | PRN
  Administered 2013-08-27: 100 mg via INTRAVENOUS

## 2013-08-27 MED ORDER — HEPARIN (PORCINE) IN NACL 2-0.9 UNIT/ML-% IJ SOLN
INTRAMUSCULAR | Status: AC
  Filled 2013-08-27: qty 1000

## 2013-08-27 MED ORDER — SODIUM CHLORIDE 0.9 % IJ SOLN
3.0000 mL | Freq: Two times a day (BID) | INTRAMUSCULAR | Status: DC
  Administered 2013-08-28 – 2013-08-31 (×6): 3 mL via INTRAVENOUS

## 2013-08-27 MED ORDER — MAGNESIUM SULFATE 50 % IJ SOLN
40.0000 meq | INTRAMUSCULAR | Status: DC
  Filled 2013-08-27 (×2): qty 10

## 2013-08-27 MED ORDER — PHENYLEPHRINE HCL 10 MG/ML IJ SOLN
10.0000 mg | INTRAVENOUS | Status: DC | PRN
  Administered 2013-08-27: 5 ug/min via INTRAVENOUS

## 2013-08-27 MED ORDER — METOCLOPRAMIDE HCL 5 MG/ML IJ SOLN
10.0000 mg | Freq: Four times a day (QID) | INTRAMUSCULAR | Status: AC
  Administered 2013-08-27 – 2013-08-28 (×4): 10 mg via INTRAVENOUS
  Filled 2013-08-27 (×4): qty 2

## 2013-08-27 MED ORDER — ROCURONIUM BROMIDE 100 MG/10ML IV SOLN
INTRAVENOUS | Status: DC | PRN
  Administered 2013-08-27 (×3): 50 mg via INTRAVENOUS

## 2013-08-27 MED ORDER — OXYCODONE HCL 5 MG PO TABS
5.0000 mg | ORAL_TABLET | ORAL | Status: DC | PRN
  Administered 2013-08-28: 5 mg via ORAL
  Filled 2013-08-27: qty 1

## 2013-08-27 MED ORDER — MIDAZOLAM HCL 2 MG/2ML IJ SOLN: 2.0000 mg | INTRAMUSCULAR | Status: DC | PRN

## 2013-08-27 MED ORDER — 0.9 % SODIUM CHLORIDE (POUR BTL) OPTIME
TOPICAL | Status: DC | PRN
  Administered 2013-08-27: 1000 mL

## 2013-08-27 MED ORDER — VANCOMYCIN HCL 1000 MG IV SOLR
1000.0000 mg | INTRAVENOUS | Status: DC | PRN
  Administered 2013-08-27: 1250 mg via INTRAVENOUS

## 2013-08-27 MED ORDER — MORPHINE SULFATE 2 MG/ML IJ SOLN
1.0000 mg | INTRAMUSCULAR | Status: AC | PRN
  Administered 2013-08-28: 2 mg via INTRAVENOUS
  Filled 2013-08-27: qty 1

## 2013-08-27 MED ORDER — PHENYLEPHRINE HCL 10 MG/ML IJ SOLN
20.0000 mg | INTRAVENOUS | Status: DC | PRN
  Administered 2013-08-27: 5 ug/min via INTRAVENOUS

## 2013-08-27 MED ORDER — PLASMA-LYTE 148 IV SOLN
INTRAVENOUS | Status: DC | PRN
  Administered 2013-08-27: 14:00:00 via INTRAVASCULAR

## 2013-08-27 MED ORDER — ASPIRIN EC 325 MG PO TBEC
325.0000 mg | DELAYED_RELEASE_TABLET | Freq: Every day | ORAL | Status: DC
  Administered 2013-08-28 – 2013-09-02 (×6): 325 mg via ORAL
  Filled 2013-08-27 (×6): qty 1

## 2013-08-27 MED ORDER — FENTANYL CITRATE 0.05 MG/ML IJ SOLN
INTRAMUSCULAR | Status: DC | PRN
  Administered 2013-08-27: 250 ug via INTRAVENOUS
  Administered 2013-08-27: 50 ug via INTRAVENOUS
  Administered 2013-08-27: 100 ug via INTRAVENOUS
  Administered 2013-08-27: 250 ug via INTRAVENOUS
  Administered 2013-08-27: 100 ug via INTRAVENOUS
  Administered 2013-08-27: 500 ug via INTRAVENOUS
  Administered 2013-08-27: 250 ug via INTRAVENOUS
  Administered 2013-08-27: 100 ug via INTRAVENOUS
  Administered 2013-08-27: 50 ug via INTRAVENOUS
  Administered 2013-08-27: 100 ug via INTRAVENOUS

## 2013-08-27 MED ORDER — SODIUM CHLORIDE 0.9 % IV SOLN
INTRAVENOUS | Status: DC
  Filled 2013-08-27 (×2): qty 1

## 2013-08-27 SURGICAL SUPPLY — 85 items
ADAPTER CARDIO PERF ANTE/RETRO (ADAPTER) ×2 IMPLANT
ATTRACTOMAT 16X20 MAGNETIC DRP (DRAPES) ×2 IMPLANT
BAG DECANTER FOR FLEXI CONT (MISCELLANEOUS) ×2 IMPLANT
BANDAGE ELASTIC 4 VELCRO ST LF (GAUZE/BANDAGES/DRESSINGS) ×2 IMPLANT
BANDAGE ELASTIC 6 VELCRO ST LF (GAUZE/BANDAGES/DRESSINGS) ×2 IMPLANT
BANDAGE GAUZE ELAST BULKY 4 IN (GAUZE/BANDAGES/DRESSINGS) ×2 IMPLANT
BASKET HEART (ORDER IN 25'S) (MISCELLANEOUS) ×1
BASKET HEART (ORDER IN 25S) (MISCELLANEOUS) ×1 IMPLANT
BLADE STERNUM SYSTEM 6 (BLADE) ×2 IMPLANT
CANISTER SUCTION 2500CC (MISCELLANEOUS) ×2 IMPLANT
CANNULA EZ GLIDE AORTIC 21FR (CANNULA) ×2 IMPLANT
CANNULA GUNDRY RCSP 15FR (MISCELLANEOUS) ×2 IMPLANT
CANNULA VENOUS LOW PROF 34X46 (CANNULA) IMPLANT
CATH CPB KIT HENDRICKSON (MISCELLANEOUS) ×2 IMPLANT
CATH ROBINSON RED A/P 18FR (CATHETERS) ×2 IMPLANT
CATH THORACIC 36FR (CATHETERS) ×2 IMPLANT
CATH THORACIC 36FR RT ANG (CATHETERS) ×2 IMPLANT
CLIP RETRACTION 3.0MM CORONARY (MISCELLANEOUS) ×2 IMPLANT
CLIP TI MEDIUM 24 (CLIP) IMPLANT
CLIP TI WIDE RED SMALL 24 (CLIP) ×4 IMPLANT
COVER SURGICAL LIGHT HANDLE (MISCELLANEOUS) ×2 IMPLANT
CRADLE DONUT ADULT HEAD (MISCELLANEOUS) ×2 IMPLANT
DRAPE CARDIOVASCULAR INCISE (DRAPES) ×1
DRAPE SLUSH/WARMER DISC (DRAPES) ×2 IMPLANT
DRAPE SRG 135X102X78XABS (DRAPES) ×1 IMPLANT
DRSG COVADERM 4X14 (GAUZE/BANDAGES/DRESSINGS) ×2 IMPLANT
ELECT REM PT RETURN 9FT ADLT (ELECTROSURGICAL) ×4
ELECTRODE REM PT RTRN 9FT ADLT (ELECTROSURGICAL) ×2 IMPLANT
GLOVE EUDERMIC 7 POWDERFREE (GLOVE) ×6 IMPLANT
GOWN PREVENTION PLUS XLARGE (GOWN DISPOSABLE) ×4 IMPLANT
GOWN STRL NON-REIN LRG LVL3 (GOWN DISPOSABLE) ×8 IMPLANT
HEMOSTAT POWDER SURGIFOAM 1G (HEMOSTASIS) ×6 IMPLANT
HEMOSTAT SURGICEL 2X14 (HEMOSTASIS) ×2 IMPLANT
INSERT FOGARTY XLG (MISCELLANEOUS) IMPLANT
KIT BASIN OR (CUSTOM PROCEDURE TRAY) ×2 IMPLANT
KIT ROOM TURNOVER OR (KITS) ×2 IMPLANT
KIT SUCTION CATH 14FR (SUCTIONS) ×6 IMPLANT
KIT VASOVIEW W/TROCAR VH 2000 (KITS) ×2 IMPLANT
MARKER GRAFT CORONARY BYPASS (MISCELLANEOUS) ×8 IMPLANT
NS IRRIG 1000ML POUR BTL (IV SOLUTION) ×10 IMPLANT
PACK OPEN HEART (CUSTOM PROCEDURE TRAY) ×2 IMPLANT
PAD ARMBOARD 7.5X6 YLW CONV (MISCELLANEOUS) ×4 IMPLANT
PAD ELECT DEFIB RADIOL ZOLL (MISCELLANEOUS) ×2 IMPLANT
PENCIL BUTTON HOLSTER BLD 10FT (ELECTRODE) ×2 IMPLANT
PUNCH AORTIC ROTATE 4.0MM (MISCELLANEOUS) IMPLANT
PUNCH AORTIC ROTATE 4.5MM 8IN (MISCELLANEOUS) ×2 IMPLANT
PUNCH AORTIC ROTATE 5MM 8IN (MISCELLANEOUS) IMPLANT
SET CARDIOPLEGIA MPS 5001102 (MISCELLANEOUS) ×2 IMPLANT
SPONGE GAUZE 4X4 12PLY (GAUZE/BANDAGES/DRESSINGS) ×4 IMPLANT
SUT BONE WAX W31G (SUTURE) ×2 IMPLANT
SUT MNCRL AB 4-0 PS2 18 (SUTURE) IMPLANT
SUT PROLENE 3 0 SH DA (SUTURE) ×4 IMPLANT
SUT PROLENE 4 0 RB 1 (SUTURE) ×3
SUT PROLENE 4 0 SH DA (SUTURE) IMPLANT
SUT PROLENE 4-0 RB1 .5 CRCL 36 (SUTURE) ×3 IMPLANT
SUT PROLENE 5 0 C 1 36 (SUTURE) ×4 IMPLANT
SUT PROLENE 6 0 C 1 30 (SUTURE) ×4 IMPLANT
SUT PROLENE 7 0 BV1 MDA (SUTURE) ×4 IMPLANT
SUT PROLENE 8 0 BV175 6 (SUTURE) IMPLANT
SUT SILK  1 MH (SUTURE)
SUT SILK 1 MH (SUTURE) IMPLANT
SUT STEEL 6MS V (SUTURE) ×2 IMPLANT
SUT STEEL STERNAL CCS#1 18IN (SUTURE) IMPLANT
SUT STEEL SZ 6 DBL 3X14 BALL (SUTURE) ×2 IMPLANT
SUT VIC AB 1 CTX 36 (SUTURE) ×2
SUT VIC AB 1 CTX36XBRD ANBCTR (SUTURE) ×2 IMPLANT
SUT VIC AB 2-0 CT1 27 (SUTURE)
SUT VIC AB 2-0 CT1 TAPERPNT 27 (SUTURE) IMPLANT
SUT VIC AB 2-0 CTX 27 (SUTURE) IMPLANT
SUT VIC AB 3-0 SH 27 (SUTURE)
SUT VIC AB 3-0 SH 27X BRD (SUTURE) IMPLANT
SUT VIC AB 3-0 X1 27 (SUTURE) IMPLANT
SUT VICRYL 4-0 PS2 18IN ABS (SUTURE) IMPLANT
SUTURE E-PAK OPEN HEART (SUTURE) ×2 IMPLANT
SYSTEM SAHARA CHEST DRAIN ATS (WOUND CARE) ×2 IMPLANT
TAPE CLOTH SURG 4X10 WHT LF (GAUZE/BANDAGES/DRESSINGS) ×2 IMPLANT
TOWEL OR 17X24 6PK STRL BLUE (TOWEL DISPOSABLE) ×4 IMPLANT
TOWEL OR 17X26 10 PK STRL BLUE (TOWEL DISPOSABLE) ×4 IMPLANT
TRAY FOLEY IC TEMP SENS 14FR (CATHETERS) ×2 IMPLANT
TUBE FEEDING 8FR 16IN STR KANG (MISCELLANEOUS) ×2 IMPLANT
TUBE SUCT INTRACARD DLP 20F (MISCELLANEOUS) ×2 IMPLANT
TUBING INSUFFLATION 10FT LAP (TUBING) ×2 IMPLANT
UNDERPAD 30X30 INCONTINENT (UNDERPADS AND DIAPERS) ×2 IMPLANT
WATER STERILE IRR 1000ML POUR (IV SOLUTION) ×4 IMPLANT
YANKAUER SUCT BULB TIP NO VENT (SUCTIONS) IMPLANT

## 2013-08-27 NOTE — Anesthesia Preprocedure Evaluation (Signed)
Anesthesia Evaluation  Patient identified by MRN, date of birth, ID band Patient awake    Reviewed: Allergy & Precautions, H&P , NPO status , Patient's Chart, lab work & pertinent test results  Airway Mallampati: II  Neck ROM: full    Dental   Pulmonary sleep apnea ,          Cardiovascular hypertension, + CAD and + Peripheral Vascular Disease  Severe left main disease found on cath today.  Brought to OR for emergency CABG   Neuro/Psych    GI/Hepatic GERD-  ,  Endo/Other    Renal/GU      Musculoskeletal   Abdominal   Peds  Hematology   Anesthesia Other Findings   Reproductive/Obstetrics                           Anesthesia Physical Anesthesia Plan  ASA: III and emergent  Anesthesia Plan: General   Post-op Pain Management:    Induction: Intravenous  Airway Management Planned: Oral ETT  Additional Equipment: Arterial line, CVP, PA Cath, TEE and Ultrasound Guidance Line Placement  Intra-op Plan:   Post-operative Plan: Post-operative intubation/ventilation  Informed Consent: I have reviewed the patients History and Physical, chart, labs and discussed the procedure including the risks, benefits and alternatives for the proposed anesthesia with the patient or authorized representative who has indicated his/her understanding and acceptance.     Plan Discussed with: CRNA, Anesthesiologist and Surgeon  Anesthesia Plan Comments:         Anesthesia Quick Evaluation

## 2013-08-27 NOTE — Progress Notes (Signed)
Utilization Review Completed.Dowell, [name]  

## 2013-08-27 NOTE — Transfer of Care (Signed)
Immediate Anesthesia Transfer of Care Note  Patient: [name]  Procedure(s) Performed: Procedure(s): CORONARY ARTERY BYPASS GRAFTING (CABG) times two using left internal mammary and right saphenous vein using endoscope. (N/A)  Patient Location: SICU  Anesthesia Type:General  Level of Consciousness: Patient remains intubated per anesthesia plan  Airway & Oxygen Therapy: Patient remains intubated per anesthesia plan and Patient placed on Ventilator (see vital sign flow sheet for setting)  Post-op Assessment: Report given to PACU RN and Post -op Vital signs reviewed and stable  Post vital signs: Reviewed and stable  Complications: No apparent anesthesia complications

## 2013-08-27 NOTE — OR Nursing (Signed)
1314-Right radial cath site rechecked.  No change from previous, radial pulse palpable; capillary refill unremarkable.  Due to nature of patient positioning for case and heparinizing of patient during surgery, Cath Lab called to assess site.  Virl Diamond came to assess.  Please see note.  Will continue to monitor.

## 2013-08-27 NOTE — H&P (View-Only) (Signed)
Reason for Consult:Left main disease Referring Physician: Dr. [place]  [name] is an 77 y.o. male.  HPI: 77 yo with a history of CAD- s/p PTCA 22 years ago. Doing well until 3 weeks ago when he began having chest tightness and SOB with exertion. Has been progressive over that time. Today he had cardiac cath which showed a critical 95% left main stenosis. He is currently pain free.  Denies orthopnea, PND, edema, stroke/TIA symptoms, blood clots, abnormal bleeding.  Past Medical History  Diagnosis Date  . HYPERLIPIDEMIA 04/06/2010  . BENIGN POSITIONAL VERTIGO 04/06/2010  . HYPERTENSION 04/06/2010  . CAD 04/06/2010  . GERD 04/06/2010  . SKIN LESION 04/06/2010  . Acute sinusitis, unspecified 11/27/2010  . Coronary artery disease 2006    angioplasty  . Prostate cancer 09/06/05 dx    adenocarcinoma gleason=3+4=7,pSA=<0.01-0.13-212  . Sleep apnea   . Anal fissure   . Allergy     bee sting,,amoxicillin, hydrocodone  . Joint pain   . OSTEOARTHRITIS, GENERALIZED 04/06/2010  . History of radiation therapy 08/24/12-10/22/12    prostate 6600cGy/33 sessions  . B12 deficiency     Past Surgical History  Procedure Laterality Date  . Angioplasty  1991  . Esophagas dilatation  1992  . Hernia repair  1994    b/l groin area  . Prostate surgery  11/18/05    prostatectomy, adenocarcinoma radical  . Back surgery    . Knee surgery      right arthoscopic    Family History  Problem Relation Age of Onset  . Cancer Father     prostate died 72 something  . Arthritis Other   . Hyperlipidemia Other   . Hypertension Other   . Cancer Brother     prostate cancer    Social History:  reports that he quit smoking about 18 years ago. His smoking use included Cigarettes. He has a 20 pack-year smoking history. He does not have any smokeless tobacco history on file. He reports that  drinks alcohol. He reports that he does not use illicit drugs.  Allergies:  Allergies  Allergen Reactions  . Bee Venom   .  Hydrocodone     REACTION: disorientation  . Amoxicillin Rash    Medications:  Prior to Admission:  Prescriptions prior to admission  Medication Sig Dispense Refill  . aspirin EC 81 MG tablet Take 81 mg by mouth daily.      [name] atorvastatin (LIPITOR) 40 MG tablet Take 40 mg by mouth daily.      [name] docusate sodium (COLACE) 100 MG capsule Take 200 mg by mouth at bedtime.       [name] olmesartan (BENICAR) 20 MG tablet Take 1 tablet (20 mg total) by mouth daily.  90 tablet  3  . pantoprazole (PROTONIX) 40 MG tablet Take 40 mg by mouth every other day.       . polyethylene glycol (MIRALAX / GLYCOLAX) packet Take 17 g by mouth daily as needed (for constipation).       . Tamsulosin HCl (FLOMAX) 0.4 MG CAPS Take 0.4 mg by mouth daily.         No results found for this or any previous visit (from the past 48 hour(s)).  No results found.  Review of Systems  Respiratory: Positive for shortness of breath.   Cardiovascular: Positive for chest pain.  Genitourinary: Negative for dysuria and hematuria.       Prior prostate cancer Some incontinence   Blood pressure 128/62, pulse 73, temperature  97.5 F (36.4 C), temperature source Oral, resp. rate 20, height 5' 8.5" (1.74 m), weight 160 lb (72.576 kg), SpO2 98.00%. Physical Exam  Vitals reviewed. Constitutional: He is oriented to person, place, and time. He appears well-developed and well-nourished. No distress.  HENT:  Head: Normocephalic and atraumatic.  Eyes: EOM are normal. Pupils are equal, round, and reactive to light.  Neck: Neck supple. No thyromegaly present.  Cardiovascular: Normal rate, regular rhythm, normal heart sounds and intact distal pulses.  Exam reveals no gallop and no friction rub.   No murmur heard. Respiratory: Effort normal and breath sounds normal.  GI: Soft. There is no tenderness.  Musculoskeletal: He exhibits no edema.  Lymphadenopathy:    He has no cervical adenopathy.  Neurological: He is alert and oriented to person,  place, and time. No cranial nerve deficit.  Skin: Skin is warm and dry.   CARDIAC CATH Procedural Findings:  Hemodynamics:  AO 107/49 with a mean of 73 mmHg  LV 107/13 mmHg  Coronary angiography:  Coronary dominance: Left  Left mainstem: The left main coronary is moderately calcified. There is a critical ostial left main stenosis of 90-95%.  Left anterior descending (LAD): The left anterior descending artery is moderately calcified. There is diffuse disease in the proximal vessel up to 40-50%. The LAD is a large vessel and appears to be a good target vessel. The first diagonal is moderate in size and is normal.  Left circumflex (LCx): The left circumflex is a dominant vessel. It is moderately calcified with diffuse disease in the proximal vessel up to 30-40%. There is a small marginal branch on the lateral wall. There is a very large posterior lateral branch and PDA vessels.  Right coronary artery (RCA): The right coronary is a small nondominant vessel. There was mild narrowing at its origin of to 30%.  Left ventriculography: Left ventricular systolic function is normal, LVEF is estimated at 55-65%, there is no significant mitral regurgitation  Final Conclusions:  1. The left main stenosis. Patient has a left dominant system.  2. Normal LV function.  Recommendations: CT surgery consultation for coronary bypass surgery.  Assessment/Plan: 77 yo with critical left main disease. Needs urgent CABG for survival benefit and relief of symptoms.  I have discussed with the patient and his daughter the general nature of the procedure, the need for general anesthesia, and the incisions to be used. I have discussed the expected hospital stay, overall recovery and short and long term outcomes. We discussed the indications, risks, benefits and alternatives. He understands the risks include, but are not limited to, death, stroke, MI, DVT/PE, bleeding, possible need for transfusion, infections, cardiac  arrhythmias, and other organ system dysfunction including respiratory, renal, or GI complications. He accepts the risks and agree to proceed.  OR has been notified. Will proceed ASAP  HENDRICKSON,STEVEN C 08/27/2013, 12:08 PM

## 2013-08-27 NOTE — Anesthesia Procedure Notes (Signed)
Procedure Name: Intubation Date/Time: 08/27/2013 1:16 PM Performed by: Armandina Gemma Pre-anesthesia Checklist: Patient identified, Timeout performed, Emergency Drugs available, Suction available and Patient being monitored Patient Re-evaluated:Patient Re-evaluated prior to inductionOxygen Delivery Method: Circle system utilized Preoxygenation: Pre-oxygenation with 100% oxygen Intubation Type: IV induction Ventilation: Mask ventilation without difficulty Laryngoscope Size: Miller and 2 Grade View: Grade I Tube type: Oral Tube size: 7.5 mm Number of attempts: 1 Airway Equipment and Method: Stylet Placement Confirmation: ETT inserted through vocal cords under direct vision,  breath sounds checked- equal and bilateral and positive ETCO2 Secured at: 22 cm Tube secured with: Tape Dental Injury: Teeth and Oropharynx as per pre-operative assessment  Comments: IV induction Hodierene- intubation atraumatic teeth and mouth as preop- no damage to teeth or mouth

## 2013-08-27 NOTE — Progress Notes (Addendum)
Called to OR 14 for Radial band check. Area of ecchymosis present proximal and distal to TR band on the right hand. Area was marked on skin by OR staff. 1/2 dollar sized area present distal to tr band. Both areas are soft. Due to OR staff not being able to access site during surgical procedure, 3cc air added to tr band. Spo2 is currently being monitored on right hand.  Dr. [place] will be notified.

## 2013-08-27 NOTE — CV Procedure (Signed)
   Cardiac Catheterization Procedure Note  Name: [name] MRN: [id number] DOB: 09-08-1936  Procedure: Left Heart Cath, Selective Coronary Angiography, LV angiography  Indication: 77 year old white male with history of coronary disease with remote angioplasty of the left circumflex in 1991. He presents with recent onset of exertional angina. Symptoms are class III. Recent Myoview study was low risk.   Procedural Details: The right wrist was prepped, draped, and anesthetized with 1% lidocaine. Using the modified Seldinger technique, a 5 French sheath was introduced into the right radial artery. 3 mg of verapamil was administered through the sheath, weight-based unfractionated heparin was administered intravenously. Standard Judkins catheters were used for selective coronary angiography and left ventriculography. Catheter exchanges were performed over an exchange length guidewire. There were no immediate procedural complications. A TR band was used for radial hemostasis at the completion of the procedure.  The patient was transferred to the post catheterization recovery area for further monitoring.  Procedural Findings: Hemodynamics: AO 107/49 with a mean of 73 mmHg LV 107/13 mmHg  Coronary angiography: Coronary dominance: Left  Left mainstem: The left main coronary is moderately calcified. There is a critical ostial left main stenosis of 90-95%.  Left anterior descending (LAD): The left anterior descending artery is moderately calcified. There is diffuse disease in the proximal vessel up to 40-50%. The LAD is a large vessel and appears to be a good target vessel. The first diagonal is moderate in size and is normal.  Left circumflex (LCx): The left circumflex is a dominant vessel. It is moderately calcified with diffuse disease in the proximal vessel up to 30-40%. There is a small marginal branch on the lateral wall. There is a very large posterior lateral branch and PDA vessels.    Right coronary artery (RCA): The right coronary is a small nondominant vessel. There was mild narrowing at its origin of to 30%.  Left ventriculography: Left ventricular systolic function is normal, LVEF is estimated at 55-65%, there is no significant mitral regurgitation   Final Conclusions:  1. The left main stenosis. Patient has a left dominant system. 2. Normal LV function.  Recommendations: CT surgery consultation for coronary bypass surgery.  Theron Arista [facility] 08/27/2013, 11:18 AM

## 2013-08-27 NOTE — Preoperative (Signed)
Beta Blockers   Reason not to administer Beta Blockers:Not Applicable 

## 2013-08-27 NOTE — Consult Note (Signed)
Reason for Consult:Left main disease Referring Physician: Dr. Jordan  Boris C Natal is an 77 y.o. male.  HPI: 77 yo with a history of CAD- s/p PTCA 22 years ago. Doing well until 3 weeks ago when he began having chest tightness and SOB with exertion. Has been progressive over that time. Today he had cardiac cath which showed a critical 95% left main stenosis. He is currently pain free.  Denies orthopnea, PND, edema, stroke/TIA symptoms, blood clots, abnormal bleeding.  Past Medical History  Diagnosis Date  . HYPERLIPIDEMIA 04/06/2010  . BENIGN POSITIONAL VERTIGO 04/06/2010  . HYPERTENSION 04/06/2010  . CAD 04/06/2010  . GERD 04/06/2010  . SKIN LESION 04/06/2010  . Acute sinusitis, unspecified 11/27/2010  . Coronary artery disease 2006    angioplasty  . Prostate cancer 09/06/05 dx    adenocarcinoma gleason=3+4=7,pSA=<0.01-0.13-212  . Sleep apnea   . Anal fissure   . Allergy     bee sting,,amoxicillin, hydrocodone  . Joint pain   . OSTEOARTHRITIS, GENERALIZED 04/06/2010  . History of radiation therapy 08/24/12-10/22/12    prostate 6600cGy/33 sessions  . B12 deficiency     Past Surgical History  Procedure Laterality Date  . Angioplasty  1991  . Esophagas dilatation  1992  . Hernia repair  1994    b/l groin area  . Prostate surgery  11/18/05    prostatectomy, adenocarcinoma radical  . Back surgery    . Knee surgery      right arthoscopic    Family History  Problem Relation Age of Onset  . Cancer Father     prostate died 90 something  . Arthritis Other   . Hyperlipidemia Other   . Hypertension Other   . Cancer Brother     prostate cancer    Social History:  reports that he quit smoking about 18 years ago. His smoking use included Cigarettes. He has a 20 pack-year smoking history. He does not have any smokeless tobacco history on file. He reports that  drinks alcohol. He reports that he does not use illicit drugs.  Allergies:  Allergies  Allergen Reactions  . Bee Venom   .  Hydrocodone     REACTION: disorientation  . Amoxicillin Rash    Medications:  Prior to Admission:  Prescriptions prior to admission  Medication Sig Dispense Refill  . aspirin EC 81 MG tablet Take 81 mg by mouth daily.      . atorvastatin (LIPITOR) 40 MG tablet Take 40 mg by mouth daily.      . docusate sodium (COLACE) 100 MG capsule Take 200 mg by mouth at bedtime.       . olmesartan (BENICAR) 20 MG tablet Take 1 tablet (20 mg total) by mouth daily.  90 tablet  3  . pantoprazole (PROTONIX) 40 MG tablet Take 40 mg by mouth every other day.       . polyethylene glycol (MIRALAX / GLYCOLAX) packet Take 17 g by mouth daily as needed (for constipation).       . Tamsulosin HCl (FLOMAX) 0.4 MG CAPS Take 0.4 mg by mouth daily.         No results found for this or any previous visit (from the past 48 hour(s)).  No results found.  Review of Systems  Respiratory: Positive for shortness of breath.   Cardiovascular: Positive for chest pain.  Genitourinary: Negative for dysuria and hematuria.       Prior prostate cancer Some incontinence   Blood pressure 128/62, pulse 73, temperature   97.5 F (36.4 C), temperature source Oral, resp. rate 20, height 5' 8.5" (1.74 m), weight 160 lb (72.576 kg), SpO2 98.00%. Physical Exam  Vitals reviewed. Constitutional: He is oriented to person, place, and time. He appears well-developed and well-nourished. No distress.  HENT:  Head: Normocephalic and atraumatic.  Eyes: EOM are normal. Pupils are equal, round, and reactive to light.  Neck: Neck supple. No thyromegaly present.  Cardiovascular: Normal rate, regular rhythm, normal heart sounds and intact distal pulses.  Exam reveals no gallop and no friction rub.   No murmur heard. Respiratory: Effort normal and breath sounds normal.  GI: Soft. There is no tenderness.  Musculoskeletal: He exhibits no edema.  Lymphadenopathy:    He has no cervical adenopathy.  Neurological: He is alert and oriented to person,  place, and time. No cranial nerve deficit.  Skin: Skin is warm and dry.   CARDIAC CATH Procedural Findings:  Hemodynamics:  AO 107/49 with a mean of 73 mmHg  LV 107/13 mmHg  Coronary angiography:  Coronary dominance: Left  Left mainstem: The left main coronary is moderately calcified. There is a critical ostial left main stenosis of 90-95%.  Left anterior descending (LAD): The left anterior descending artery is moderately calcified. There is diffuse disease in the proximal vessel up to 40-50%. The LAD is a large vessel and appears to be a good target vessel. The first diagonal is moderate in size and is normal.  Left circumflex (LCx): The left circumflex is a dominant vessel. It is moderately calcified with diffuse disease in the proximal vessel up to 30-40%. There is a small marginal branch on the lateral wall. There is a very large posterior lateral branch and PDA vessels.  Right coronary artery (RCA): The right coronary is a small nondominant vessel. There was mild narrowing at its origin of to 30%.  Left ventriculography: Left ventricular systolic function is normal, LVEF is estimated at 55-65%, there is no significant mitral regurgitation  Final Conclusions:  1. The left main stenosis. Patient has a left dominant system.  2. Normal LV function.  Recommendations: CT surgery consultation for coronary bypass surgery.  Assessment/Plan: 77 yo with critical left main disease. Needs urgent CABG for survival benefit and relief of symptoms.  I have discussed with the patient and his daughter the general nature of the procedure, the need for general anesthesia, and the incisions to be used. I have discussed the expected hospital stay, overall recovery and short and long term outcomes. We discussed the indications, risks, benefits and alternatives. He understands the risks include, but are not limited to, death, stroke, MI, DVT/PE, bleeding, possible need for transfusion, infections, cardiac  arrhythmias, and other organ system dysfunction including respiratory, renal, or GI complications. He accepts the risks and agree to proceed.  OR has been notified. Will proceed ASAP  HENDRICKSON,STEVEN C 08/27/2013, 12:08 PM      

## 2013-08-27 NOTE — Progress Notes (Signed)
Air released from TR band per protocol. All air out by 1830. No bleeding noticed. Will continue to monitor pt.

## 2013-08-27 NOTE — OR Nursing (Signed)
Right Radial Cath Site assessed in Short Stay Holding Area.  3cc air removed from armband by Clarice Pole RN; noted hematoma and oozing immediately after; 3cc reinserted.  Capillary refill unremarkable, strong palpable pulse right radial; hematoma margins marked.  Will continue to monitor patient.

## 2013-08-27 NOTE — Interval H&P Note (Signed)
History and Physical Interval Note:  08/27/2013 10:41 AM  [name]  has presented today for surgery, with the diagnosis of cp  The various methods of treatment have been discussed with the patient and family. After consideration of risks, benefits and other options for treatment, the patient has consented to  Procedure(s): LEFT HEART CATHETERIZATION WITH CORONARY ANGIOGRAM (N/A) as a surgical intervention .  The patient's history has been reviewed, patient examined, no change in status, stable for surgery.  I have reviewed the patient's chart and labs.  Questions were answered to the patient's satisfaction.    Cath Lab Visit (complete for each Cath Lab visit)  Clinical Evaluation Leading to the Procedure:   ACS: no  Non-ACS:    Anginal Classification: CCS III  Anti-ischemic medical therapy: No Therapy  Non-Invasive Test Results: Low-risk stress test findings: cardiac mortality <1%/year  Prior CABG: No previous CABG       [name] 08/27/2013 10:41 AM

## 2013-08-27 NOTE — OR Nursing (Addendum)
Right Radial cath site reassessed; strong palpable pulse; capillary refill unremarkable, noted slight extension of hematoma margins.  Area Proximal and Distal to armband soft.  Dr. Dorris Fetch called and notified to assess right cath site prior to patient positioning.  02 saturation monitor applied to right index finger.  02 sat 99-100%.  Will continue to monitor.

## 2013-08-27 NOTE — Brief Op Note (Addendum)
08/27/2013  4:02 PM  PATIENT:  [name]  77 y.o. male  PRE-OPERATIVE DIAGNOSIS:  CAD  POST-OPERATIVE DIAGNOSIS:  coronary artery disease  PROCEDURE:  Procedure(s):  CORONARY ARTERY BYPASS GRAFTING x 2 -LIMA to LAD -SVG to OM1  ENDOSCOPIC SAPHENOUS VEIN HARVEST RIGHT LEG  SURGEON:  Surgeon(s) and Role:    * Loreli Slot, MD - Primary  PHYSICIAN ASSISTANT: Erin Barrett PA-C  ANESTHESIA:   general  EBL:  Total I/O In: 1600 [I.V.:1600] Out: 550 [Urine:550]  BLOOD ADMINISTERED: CELLSAVER  DRAINS: Left Pleural Chest tube, Mediastinal chest drains   LOCAL MEDICATIONS USED:  NONE  SPECIMEN:  No Specimen  DISPOSITION OF SPECIMEN:  N/A  COUNTS:  YES  PLAN OF CARE: Admit to inpatient   PATIENT DISPOSITION:  ICU - intubated and hemodynamically stable.   Delay start of Pharmacological VTE agent (>24hrs) due to surgical blood loss or risk of bleeding: yes  XC= 45 min CPB= 77 min  Good targets, good conduits

## 2013-08-27 NOTE — Interval H&P Note (Signed)
History and Physical Interval Note:  08/27/2013 12:14 PM  [name]  has presented today for surgery, with the diagnosis of CAD  The various methods of treatment have been discussed with the patient and family. After consideration of risks, benefits and other options for treatment, the patient has consented to  Procedure(s): CORONARY ARTERY BYPASS GRAFTING (CABG) (N/A) as a surgical intervention .  The patient's history has been reviewed, patient examined, no change in status, stable for surgery.  I have reviewed the patient's chart and labs.  Questions were answered to the patient's satisfaction.     HENDRICKSON,STEVEN C

## 2013-08-27 NOTE — H&P (View-Only) (Signed)
  [name] Date of Birth  04/14/1936 [name]     [place] Office  [address]    [address]   [address] [place], [place]  [address]    [name], [place]  [address] [phone]  Fax  [phone]  [phone]  Fax [phone]  Problem list: 1. Coronary artery disease- status post remote PTCA to the left complex artery in 1991 2. Hypertension 3. Hyperlipidemia 4. Prostate Cancer.  History of Present Illness:  [name] is  a 77-year-old gentleman with a history of PTCA approximately 22 years ago.  He's not had any episodes of chest pain or shortness of breath. He stays fairly active although he doesn't exercise at the gym.  He has some problems with some arthritis-type pain in his right hip.  The Lipitor causes a little bit of muscular skeletal pain.  May 10, 2013:  He stopped hib Benicar 2 months ago. His BP has been running low at home.  He has been on a new medicine for his prostate and was concerned that his BP would run too low.   He has been taking a varied dose of benicar - 1 tab, 1/2 tab, 1/4 tablet whenever he thinks he needs it.   Sept. 5, 2014:  [name] presents today for worsening shortness of breath and chest pain.  Last Saturday, he mowed the lawn and spread 200 lbs of lime.  He developed severe CP and started to take some NTG.  The pain finally eased up. Several days later, he had recurrent CP while cleaning out the gutters.    He thinks he was just overheated.   He has been recording his BP and has been getting some low readings.     He has been having some hip pain and does not know if he will be able to walk on the treadmill.   Sept. 24, 2014: [name] was seen recently with some worsening chest pain. He had a stress Myoview study which was interpreted as low risk he has a fixed inferoapical defect.  He exertion, he is still having angina.  Lasts 5 minutes. In the center of his chest. No radiatioin.  Occurs only with exertion ( stating  mower or Rototiller- never walking around the house.)   Class II - III angina.    Yesterday, he had some mild baseline CP which worsened while walking around the grocery store.    Current Outpatient Prescriptions on File Prior to Visit  Medication Sig Dispense Refill  . aspirin 81 MG tablet Take 81 mg by mouth daily.        . atorvastatin (LIPITOR) 40 MG tablet TAKE 1 TABLET DAILY  90 tablet  0  . docusate sodium (COLACE) 100 MG capsule Take 100 mg by mouth 2 (two) times daily.      . olmesartan (BENICAR) 20 MG tablet Take 1 tablet (20 mg total) by mouth daily.  90 tablet  3  . pantoprazole (PROTONIX) 40 MG tablet Take 40 mg by mouth as needed.      . polyethylene glycol (MIRALAX / GLYCOLAX) packet Take 17 g by mouth as needed.      . Tamsulosin HCl (FLOMAX) 0.4 MG CAPS Take 0.4 mg by mouth daily.        No current facility-administered medications on file prior to visit.    Allergies  Allergen Reactions  . Bee Venom   . Hydrocodone     REACTION: disorientation  . Amoxicillin Rash      Past Medical History  Diagnosis Date  . HYPERLIPIDEMIA 04/06/2010  . BENIGN POSITIONAL VERTIGO 04/06/2010  . HYPERTENSION 04/06/2010  . CAD 04/06/2010  . GERD 04/06/2010  . SKIN LESION 04/06/2010  . Acute sinusitis, unspecified 11/27/2010  . Coronary artery disease 2006    angioplasty  . Prostate cancer 09/06/05 dx    adenocarcinoma gleason=3+4=7,pSA=<0.01-0.13-212  . Sleep apnea   . Anal fissure   . Allergy     bee sting,,amoxicillin, hydrocodone  . Joint pain   . OSTEOARTHRITIS, GENERALIZED 04/06/2010  . History of radiation therapy 08/24/12-10/22/12    prostate 6600cGy/33 sessions  . B12 deficiency     Past Surgical History  Procedure Laterality Date  . Angioplasty  1991  . Esophagas dilatation  1992  . Hernia repair  1994    b/l groin area  . Prostate surgery  11/18/05    prostatectomy, adenocarcinoma radical  . Back surgery    . Knee surgery      right arthoscopic    History  Smoking  status  . Former Smoker -- 2.00 packs/day for 10 years  . Types: Cigarettes  . Quit date: 12/03/1994  Smokeless tobacco  . Not on file    History  Alcohol Use  . Yes    Comment: occasionally    Family History  Problem Relation Age of Onset  . Cancer Father     prostate died 90 something  . Arthritis Other   . Hyperlipidemia Other   . Hypertension Other   . Cancer Brother     prostate cancer    Reviw of Systems:  Reviewed in the HPI.  All other systems are negative.  Physical Exam: Blood pressure 124/60, pulse 72, height 5' 8" (1.727 m), weight 164 lb (74.39 kg). General: Well developed, well nourished, in no acute distress.  Head: Normocephalic, atraumatic, sclera non-icteric, mucus membranes are moist,   Neck: Supple. Negative for carotid bruits. JVD not elevated.  Lungs: Clear bilaterally to auscultation without wheezes, rales, or rhonchi. Breathing is unlabored.  Heart: RRR with S1 S2. No murmurs, rubs, or gallops appreciated.  Abdomen: Soft, non-tender, non-distended with normoactive bowel sounds. No hepatomegaly. No rebound/guarding. No obvious abdominal masses.  Msk:  Strength and tone appear normal for age.  Extremities: No clubbing or cyanosis. No edema.  Distal pedal pulses are 2+ and equal bilaterally.  Neuro: Alert and oriented X 3. Moves all extremities spontaneously.  Psych:  Responds to questions appropriately with a normal affect.  ECG: Sept. 5, 2014:  Sinus brady at 59.  LAE, no ST or T wave abnormalities  Assessment / Plan:   

## 2013-08-28 ENCOUNTER — Inpatient Hospital Stay (HOSPITAL_COMMUNITY): Payer: Medicare Other

## 2013-08-28 DIAGNOSIS — I959 Hypotension, unspecified: Secondary | ICD-10-CM | POA: Diagnosis not present

## 2013-08-28 DIAGNOSIS — I498 Other specified cardiac arrhythmias: Secondary | ICD-10-CM | POA: Diagnosis not present

## 2013-08-28 DIAGNOSIS — D696 Thrombocytopenia, unspecified: Secondary | ICD-10-CM | POA: Diagnosis not present

## 2013-08-28 DIAGNOSIS — I2510 Atherosclerotic heart disease of native coronary artery without angina pectoris: Secondary | ICD-10-CM

## 2013-08-28 DIAGNOSIS — J9819 Other pulmonary collapse: Secondary | ICD-10-CM | POA: Diagnosis not present

## 2013-08-28 LAB — MAGNESIUM
Magnesium: 2.7 mg/dL — ABNORMAL HIGH (ref 1.5–2.5)
Magnesium: 2.5 mg/dL (ref 1.5–2.5)

## 2013-08-28 LAB — CREATININE, SERUM
Creatinine, Ser: 1.2 mg/dL (ref 0.50–1.35)
GFR calc Af Amer: 66 mL/min — ABNORMAL LOW (ref >90)
GFR calc non Af Amer: 57 mL/min — ABNORMAL LOW (ref >90)

## 2013-08-28 LAB — POCT I-STAT 3, ART BLOOD GAS (G3+)
Acid-base deficit: 6 mmol/L — ABNORMAL HIGH (ref 0.0–2.0)
Acid-base deficit: 6 mmol/L — ABNORMAL HIGH (ref 0.0–2.0)
Bicarbonate: 19.6 mEq/L — ABNORMAL LOW (ref 20.0–24.0)
Bicarbonate: 19.9 mEq/L — ABNORMAL LOW (ref 20.0–24.0)
O2 Saturation: 99 %
O2 Saturation: 99 %
Patient temperature: 36.6
Patient temperature: 36.7
TCO2: 21 mmol/L (ref 0–100)
TCO2: 21 mmol/L (ref 0–100)
pCO2 arterial: 35.8 mmHg (ref 35.0–45.0)
pCO2 arterial: 41.9 mmHg (ref 35.0–45.0)
pH, Arterial: 7.344 — ABNORMAL LOW (ref 7.350–7.450)
pH, Arterial: 7.283 — ABNORMAL LOW (ref 7.350–7.450)
pO2, Arterial: 136 mmHg — ABNORMAL HIGH (ref 80.0–100.0)
pO2, Arterial: 133 mmHg — ABNORMAL HIGH (ref 80.0–100.0)

## 2013-08-28 LAB — CBC
HCT: 25.5 % — ABNORMAL LOW (ref 39.0–52.0)
HCT: 24.4 % — ABNORMAL LOW (ref 39.0–52.0)
Hemoglobin: 9.2 g/dL — ABNORMAL LOW (ref 13.0–17.0)
Hemoglobin: 8.6 g/dL — ABNORMAL LOW (ref 13.0–17.0)
MCH: 35.5 pg — ABNORMAL HIGH (ref 26.0–34.0)
MCH: 35.1 pg — ABNORMAL HIGH (ref 26.0–34.0)
MCHC: 36.1 g/dL — ABNORMAL HIGH (ref 30.0–36.0)
MCHC: 35.2 g/dL (ref 30.0–36.0)
MCV: 98.5 fL (ref 78.0–100.0)
MCV: 99.6 fL (ref 78.0–100.0)
Platelets: 84 10*3/uL — ABNORMAL LOW (ref 150–400)
Platelets: 76 10*3/uL — ABNORMAL LOW (ref 150–400)
RBC: 2.59 MIL/uL — ABNORMAL LOW (ref 4.22–5.81)
RBC: 2.45 MIL/uL — ABNORMAL LOW (ref 4.22–5.81)
RDW: 12.7 % (ref 11.5–15.5)
RDW: 12.7 % (ref 11.5–15.5)
WBC: 8.1 10*3/uL (ref 4.0–10.5)
WBC: 8.4 10*3/uL (ref 4.0–10.5)

## 2013-08-28 LAB — GLUCOSE, CAPILLARY
Glucose-Capillary: 106 mg/dL — ABNORMAL HIGH (ref 70–99)
Glucose-Capillary: 112 mg/dL — ABNORMAL HIGH (ref 70–99)
Glucose-Capillary: 116 mg/dL — ABNORMAL HIGH (ref 70–99)
Glucose-Capillary: 128 mg/dL — ABNORMAL HIGH (ref 70–99)
Glucose-Capillary: 128 mg/dL — ABNORMAL HIGH (ref 70–99)
Glucose-Capillary: 159 mg/dL — ABNORMAL HIGH (ref 70–99)

## 2013-08-28 LAB — BASIC METABOLIC PANEL
BUN: 17 mg/dL (ref 6–23)
CO2: 21 mEq/L (ref 19–32)
Calcium: 7.6 mg/dL — ABNORMAL LOW (ref 8.4–10.5)
Chloride: 109 mEq/L (ref 96–112)
Creatinine, Ser: 1.28 mg/dL (ref 0.50–1.35)
GFR calc Af Amer: 61 mL/min — ABNORMAL LOW (ref >90)
GFR calc non Af Amer: 53 mL/min — ABNORMAL LOW (ref >90)
Glucose, Bld: 167 mg/dL — ABNORMAL HIGH (ref 70–99)
Potassium: 4.8 mEq/L (ref 3.5–5.1)
Sodium: 139 mEq/L (ref 135–145)

## 2013-08-28 LAB — POCT I-STAT, CHEM 8
BUN: 18 mg/dL (ref 6–23)
Calcium, Ion: 1.19 mmol/L (ref 1.13–1.30)
Chloride: 103 mEq/L (ref 96–112)
Creatinine, Ser: 1.4 mg/dL — ABNORMAL HIGH (ref 0.50–1.35)
Glucose, Bld: 133 mg/dL — ABNORMAL HIGH (ref 70–99)
HCT: 24 % — ABNORMAL LOW (ref 39.0–52.0)
Hemoglobin: 8.2 g/dL — ABNORMAL LOW (ref 13.0–17.0)
Potassium: 4.7 mEq/L (ref 3.5–5.1)
Sodium: 138 mEq/L (ref 135–145)
TCO2: 23 mmol/L (ref 0–100)

## 2013-08-28 MED ORDER — INSULIN ASPART 100 UNIT/ML ~~LOC~~ SOLN
3.0000 [IU] | Freq: Three times a day (TID) | SUBCUTANEOUS | Status: DC
  Administered 2013-08-29 – 2013-08-31 (×3): 3 [IU] via SUBCUTANEOUS

## 2013-08-28 MED ORDER — TRAMADOL HCL 50 MG PO TABS
50.0000 mg | ORAL_TABLET | ORAL | Status: DC | PRN
  Administered 2013-08-28 – 2013-08-30 (×4): 50 mg via ORAL
  Filled 2013-08-28 (×4): qty 1

## 2013-08-28 MED ORDER — SODIUM BICARBONATE 8.4 % IV SOLN
50.0000 meq | Freq: Once | INTRAVENOUS | Status: AC
  Administered 2013-08-28: 50 meq via INTRAVENOUS
  Filled 2013-08-28: qty 50

## 2013-08-28 MED ORDER — INSULIN ASPART 100 UNIT/ML ~~LOC~~ SOLN
0.0000 [IU] | SUBCUTANEOUS | Status: DC
  Administered 2013-08-28 – 2013-08-29 (×4): 2 [IU] via SUBCUTANEOUS

## 2013-08-28 NOTE — Progress Notes (Signed)
Patient examined and record reviewed.Hemodynamics stable,labs satisfactory.Patient had stable day.Continue current care.  Maintaining stable hemodynamics, out of bed to chair, good diuresis. We'll wean dopamine off in a.m. VAN TRIGT III,PETER 08/28/2013

## 2013-08-28 NOTE — Evaluation (Signed)
Physical Therapy Evaluation Patient Details Name: KEASTON PILE MRN: [id number] DOB: 1936/03/13 Today's Date: 08/28/2013 Time: 8119-1478 PT Time Calculation (min): 26 min  PT Assessment / Plan / Recommendation History of Present Illness  pt presents with CABG x2.    Clinical Impression  Pt difficult to maintain arousal, so will need to confirm home information.  Feel pt will make great progress once more alert.  Spoke with RN who removed L A-line and stated Theone Murdoch was removed earlier despite label still being on line.  Will continue to follow.      PT Assessment  Patient needs continued PT services    Follow Up Recommendations  Home health PT;Supervision/Assistance - 24 hour    Does the patient have the potential to tolerate intense rehabilitation      Barriers to Discharge        Equipment Recommendations  Rolling walker with 5" wheels;3in1 (PT)    Recommendations for Other Services     Frequency Min 3X/week    Precautions / Restrictions Precautions Precautions: Sternal;Fall Precaution Comments: Reviewed Sternal precautions, however pt drowsy and needs frequent cueing to follow.   Restrictions Weight Bearing Restrictions: No   Pertinent Vitals/Pain Indicates tightness across chest and shoulders.  Premedicated.        Mobility  Bed Mobility Bed Mobility: Not assessed Transfers Transfers: Sit to Stand;Stand to Sit Sit to Stand: 1: +2 Total assist;From chair/3-in-1 Sit to Stand: Patient Percentage: 80% Stand to Sit: 1: +2 Total assist;To chair/3-in-1 Stand to Sit: Patient Percentage: 90% Details for Transfer Assistance: Max cueing for sternal precautions and limiting use of UEs.   Ambulation/Gait Ambulation/Gait Assistance: 4: Min guard Ambulation Distance (Feet): 50 Feet Assistive device:  (Used W/C to A with carrying lines/O2) Ambulation/Gait Assistance Details: pt moves slowly and needs cueing to maintain arousal and attention to task.   Gait Pattern:  Step-through pattern;Decreased stride length Stairs: No Wheelchair Mobility Wheelchair Mobility: No    Exercises     PT Diagnosis: Difficulty walking;Acute pain  PT Problem List: Decreased strength;Decreased activity tolerance;Decreased balance;Decreased mobility;Decreased knowledge of use of DME;Decreased knowledge of precautions;Pain PT Treatment Interventions: DME instruction;Gait training;Stair training;Functional mobility training;Therapeutic activities;Therapeutic exercise;Balance training;Patient/family education     PT Goals(Current goals can be found in the care plan section) Acute Rehab PT Goals Patient Stated Goal: None stated.   PT Goal Formulation: With patient Time For Goal Achievement: 09/11/13 Potential to Achieve Goals: Good  Visit Information  Last PT Received On: 08/28/13 Assistance Needed: +2 (Lines and chair follow) History of Present Illness: pt presents with CABG x2.         Prior Functioning  Home Living Family/patient expects to be discharged to:: Private residence Living Arrangements: Spouse/significant other Available Help at Discharge: Family;Available 24 hours/day Type of Home: House Home Access: Stairs to enter [employer] of Steps: 3 Home Layout: Able to live on main level with bedroom/bathroom Home Equipment: None Additional Comments: At one point pt indicates he has to help his wife do things and later states she is able to help him.  Will need to clarify when pt is more alert.   Prior Function Level of Independence: Independent Communication Communication: No difficulties    Cognition  Cognition Arousal/Alertness: Lethargic Behavior During Therapy: Flat affect Overall Cognitive Status: Difficult to assess Difficult to assess due to: Level of arousal    Extremity/Trunk Assessment Upper Extremity Assessment Upper Extremity Assessment: Defer to OT evaluation Lower Extremity Assessment Lower Extremity Assessment: Generalized  weakness  Balance Balance Balance Assessed: Yes Static Standing Balance Static Standing - Balance Support: Bilateral upper extremity supported Static Standing - Level of Assistance: 5: Stand by assistance  End of Session PT - End of Session Equipment Utilized During Treatment: Gait belt;Oxygen Activity Tolerance: Patient limited by fatigue Patient left: in chair;with call bell/phone within reach Nurse Communication: Mobility status  GP     Sunny Schlein, [place] [phone] 08/28/2013, 11:39 AM

## 2013-08-28 NOTE — Progress Notes (Signed)
Hypoglycemic Event  CBG: 51  Treatment: D50 20 mls per glucose stabilizer  Symptoms: None  Follow-up CBG: Time:1945 CBG Result:124  Possible Reasons for Event: Unknown  Comments/MD notified :None, protocol followed and insulin drip turned off.    Jeri Modena  Remember to initiate Hypoglycemia Order Set & complete

## 2013-08-28 NOTE — Op Note (Signed)
[name], [name]              ACCOUNT NO.:  [id number]  MEDICAL RECORD NO.:  [id number]  LOCATION:  2S01C                        FACILITY:  [facility]  PHYSICIAN:  Salvatore Decent. Dorris Fetch, M.D.DATE OF BIRTH:  [birth year]  DATE OF PROCEDURE:  08/27/2013 DATE OF DISCHARGE:                              OPERATIVE REPORT   PREOPERATIVE DIAGNOSIS:  Critical left main disease with exertional angina.  POSTOPERATIVE DIAGNOSIS:  Critical left main disease with exertional angina.  PROCEDURE:  Median sternotomy, extracorporeal circulation, coronary artery bypass grafting x2 (left internal mammary artery to left anterior descending, saphenous vein graft to obtuse marginal), endoscopic vein harvest right thigh.  SURGEON:  Salvatore Decent. Dorris Fetch, M.D.  ASSISTANT:  Lowella Dandy, PA-C.  ANESTHESIA:  General.  FINDINGS:  Good quality conduits, good quality targets.  CLINICAL NOTE:  [name] is a 77 year old gentleman who presented with a history of progressive exertional angina.  He underwent cardiac catheterization, which revealed critical left main stenosis at the ostium of the left main.  He was referred for coronary artery bypass grafting.  Given the critical nature of the lesion in this left dominant circulation, the patient was advised to have urgent coronary artery bypass grafting.  The indications, risks, benefits, and alternatives were discussed in detail with the patient.  He understood and accepted the risks and agreed to proceed.  OPERATIVE NOTE:  [name] was brought to the preoperative holding area on August 27, 2013.  There Anesthesia placed a Swan-Ganz catheter and an arterial blood pressure monitoring line.  Intravenous antibiotics were administered.  He was taken to the operating room, anesthetized, and intubated.  A Foley catheter was placed.  The chest, abdomen, and legs were prepped and draped in the usual sterile fashion.  An incision was made in the medial  aspect of the right leg above the knee. The saphenous vein could not be located at this site.  A second incision was made, and the vein was localized and then was harvested endoscopically.  It was a good quality conduit. Simultaneously with the vein harvest, a median sternotomy was performed and the left internal mammary artery was harvested using standard technique.  It likewise was a good quality conduit.  Heparin 2000 units was administered during the vessel harvest.  The remainder of the full heparin dose was given prior to opening the pericardium.  After harvesting the conduits, the pericardium was opened.  The ascending aorta was inspected.  There was no palpable atherosclerotic disease. After confirming adequate anticoagulation with ACT measurement, the aorta was cannulated via concentric 2-0 Ethibond pledgeted pursestring sutures.  A dual-stage venous cannula was placed via a pursestring suture in the right atrial appendage.  Cardiopulmonary bypass was instituted and the patient was cooled to 32 degrees Celsius.  The coronary arteries were inspected and anastomotic sites were chosen.  The conduits were inspected and cut to length.  A foam pad was placed in the pericardium to insulate the heart and protect the left phrenic nerve.  A temperature probe was placed in the myocardial septum.  A retrograde cardioplegic cannula was placed via a pursestring suture in the right atrium and directed into the coronary sinus and antegrade cardioplegic cannula was  placed in the ascending aorta.  The aorta was crossclamped.  The left ventricle was emptied via the aortic root vent.  Cardiac arrest then was achieved with a combination of cold antegrade and retrograde blood cardioplegia and topical iced saline.  An initial 500 mL of cardioplegia was administered antegrade. This resulted in a rapid diastolic arrest. There was more efficient septal cooling than what had been anticipated given the  severity of the left main Stenosis. Then an additional 500 mL of cardioplegia was administered via the retrograde cannula.  Myocardial septal cooling to 10 degrees Celsius was achieved.  The following distal anastomoses were performed.  First, a reversed saphenous vein graft was placed end-to-side to the first obtuse marginal.  This was the dominant posterolateral branch.  It was a 2 mm good quality target.  The vein graft was of good quality.  It was anastomosed end-to-side with a running 7-0 Prolene suture.  At the completion of the anastomosis, cardioplegia was administered.  There was good flow and good hemostasis.  Additional cardioplegia was administered.  Next, the left internal mammary artery was brought through a window in the pericardium.  The distal end was beveled.  It was then anastomosed end-to-side to the mid LAD.  This was an intramyocardial vessel.  The LAD was a 2 mm good quality target and the mammary was a 2 mm good quality conduit.  An end- to-side anastomosis was performed with a running 8-0 Prolene suture.  At the completion of the mammary to LAD anastomosis, the bulldog clamp was briefly removed to inspect for hemostasis.  Immediate and rapid septal rewarming was noted.  The mammary pedicle was tacked to the epicardial surface of the heart with 6-0 Prolene sutures.  The vein graft was cut to length.  The cardioplegia cannula was removed from the ascending aorta and the proximal vein graft anastomosis was performed to a 4.5 mm punch aortotomy with a running 6-0 Prolene suture. At the completion of the proximal anastomosis, the patient was placed in Trendelenburg position.  Lidocaine was administered.  The bulldog clamp was again removed from the left mammary artery.  The aortic root was de- aired and the aortic crossclamp was removed.  The total crossclamp time was 45 minutes.  The patient required a single defibrillation with 10 joules and then was in sinus  rhythm thereafter.  While rewarming was completed, all proximal and distal anastomoses were inspected for hemostasis.  The retrograde cardioplegic cannula was removed.  When the patient reached a core temperature of 37 degrees Celsius, he was weaned from cardiopulmonary bypass on the first attempt without difficulty.  He was in sinus rhythm and on no inotropic support.  The total bypass time was 77 minutes.  The initial cardiac index was greater than 2 liters/minute/meter squared and the patient remained hemodynamically stable throughout the postbypass period.  A test dose of protamine was administered and was well tolerated.  The atrial and the aortic cannulae were removed.  The remainder of the protamine was administered without incident.  The chest was irrigated with warm saline.  Hemostasis was achieved.  The pericardium was reapproximated with interrupted 3-0 silk sutures.  It came together easily without tension or kinking the underlying grafts.  The left pleural and single mediastinal chest tubes were placed in separate subcostal incisions and secured with #1 silk sutures.  The sternum was closed with a combination of single double heavy gauge stainless steel wires.  Pectoralis fascia, subcutaneous tissue, and skin were closed in  a standard fashion.  All sponge, needle, and instrument counts were correct.  At the end of the procedure, the patient was taken from the operating room to the surgical intensive care unit in good condition.     Salvatore Decent Dorris Fetch, M.D.     SCH/MEDQ  D:  08/27/2013  T:  08/28/2013  Job:  [id number]

## 2013-08-28 NOTE — Procedures (Signed)
Extubation Procedure Note  Patient Details:   Name: [name] DOB: 1936-10-06 MRN: [id number]   Airway Documentation:  Airway 7.5 mm (Active)  Secured at (cm) 21 cm 08/27/2013 11:55 PM  Measured From Lips 08/27/2013 11:55 PM  Secured Location Right 08/27/2013  5:39 PM  Secured By Caron Presume Tape 08/27/2013 11:55 PM    Evaluation  O2 sats: stable throughout Complications: No apparent complications Patient did tolerate procedure well. Bilateral Breath Sounds: Diminished   Yes NIF -44 / FVC 1.55L/positive leak test Charletta Cousin [facility] 08/28/2013, 2:40 AM

## 2013-08-29 ENCOUNTER — Inpatient Hospital Stay (HOSPITAL_COMMUNITY): Payer: Medicare Other

## 2013-08-29 DIAGNOSIS — J90 Pleural effusion, not elsewhere classified: Secondary | ICD-10-CM | POA: Diagnosis not present

## 2013-08-29 LAB — CBC
HCT: 23.2 % — ABNORMAL LOW (ref 39.0–52.0)
Hemoglobin: 8.3 g/dL — ABNORMAL LOW (ref 13.0–17.0)
MCH: 35.8 pg — ABNORMAL HIGH (ref 26.0–34.0)
MCHC: 35.8 g/dL (ref 30.0–36.0)
MCV: 100 fL (ref 78.0–100.0)
Platelets: 70 10*3/uL — ABNORMAL LOW (ref 150–400)
RBC: 2.32 MIL/uL — ABNORMAL LOW (ref 4.22–5.81)
RDW: 13 % (ref 11.5–15.5)
WBC: 8.1 10*3/uL (ref 4.0–10.5)

## 2013-08-29 LAB — BASIC METABOLIC PANEL
BUN: 18 mg/dL (ref 6–23)
CO2: 24 mEq/L (ref 19–32)
Calcium: 7.8 mg/dL — ABNORMAL LOW (ref 8.4–10.5)
Chloride: 102 mEq/L (ref 96–112)
Creatinine, Ser: 0.97 mg/dL (ref 0.50–1.35)
GFR calc Af Amer: >90 mL/min (ref >90)
GFR calc non Af Amer: 79 mL/min — ABNORMAL LOW (ref >90)
Glucose, Bld: 128 mg/dL — ABNORMAL HIGH (ref 70–99)
Potassium: 4.6 mEq/L (ref 3.5–5.1)
Sodium: 134 mEq/L — ABNORMAL LOW (ref 135–145)

## 2013-08-29 LAB — GLUCOSE, CAPILLARY
Glucose-Capillary: 112 mg/dL — ABNORMAL HIGH (ref 70–99)
Glucose-Capillary: 115 mg/dL — ABNORMAL HIGH (ref 70–99)
Glucose-Capillary: 120 mg/dL — ABNORMAL HIGH (ref 70–99)
Glucose-Capillary: 121 mg/dL — ABNORMAL HIGH (ref 70–99)
Glucose-Capillary: 123 mg/dL — ABNORMAL HIGH (ref 70–99)
Glucose-Capillary: 65 mg/dL — ABNORMAL LOW (ref 70–99)
Glucose-Capillary: 74 mg/dL (ref 70–99)

## 2013-08-29 MED ORDER — MECLIZINE HCL 12.5 MG PO TABS
12.5000 mg | ORAL_TABLET | Freq: Two times a day (BID) | ORAL | Status: DC | PRN
  Filled 2013-08-29: qty 1

## 2013-08-29 MED ORDER — FE FUMARATE-B12-VIT C-FA-IFC PO CAPS
1.0000 | ORAL_CAPSULE | Freq: Three times a day (TID) | ORAL | Status: DC
  Administered 2013-08-29 – 2013-09-02 (×12): 1 via ORAL
  Filled 2013-08-29 (×17): qty 1

## 2013-08-29 NOTE — Progress Notes (Signed)
2 Days Post-Op Procedure(s) (LRB): CORONARY ARTERY BYPASS GRAFTING (CABG) times two using left internal mammary and right saphenous vein using endoscope. (N/A) Subjective: Nauseated and dizzy Dopamine increased to 5 mcg per kilogram per min to maintain blood pressure Chest x-ray clear, hemoglobin 8.2, CABG is within range Maintaining sinus rhythm Able to walk in the [place] less than 50 feet-physical therapy ordered Neuro intact  Objective: Vital signs in last 24 hours: Temp:  [97.9 F (36.6 C)-98.7 F (37.1 C)] 98.5 F (36.9 C) (09/28 0724) Pulse Rate:  [80-114] 87 (09/28 0700) Cardiac Rhythm:  [-] Normal sinus rhythm (09/28 0800) Resp:  [12-36] 13 (09/28 0700) BP: (93-128)/(36-86) 111/42 mmHg (09/28 0700) SpO2:  [92 %-100 %] 100 % (09/28 0700) Arterial Line BP: (110-111)/(51-53) 110/51 mmHg (09/27 1030) Weight:  [170 lb 3.1 oz (77.2 kg)] 170 lb 3.1 oz (77.2 kg) (09/28 0500)  Hemodynamic parameters for last 24 hours:   sinus rhythm Low-dose dopamine to augment blood pressure  Intake/Output from previous day: 09/27 0701 - 09/28 0700 In: 2426.8 [P.O.:1740; I.V.:636.8; IV Piggyback:50] Out: 1705 [Urine:1705] Intake/Output this shift: Total I/O In: 294.1 [P.O.:250; I.V.:44.1] Out: 200 [Urine:200]    Lab Results:  Recent Labs  08/28/13 1730 08/29/13 0356  WBC 8.4 8.1  HGB 8.6* 8.3*  HCT 24.4* 23.2*  PLT 76* 70*   BMET:  Recent Labs  08/28/13 0300 08/28/13 1728 08/28/13 1730 08/29/13 0356  NA 139 138  --  134*  K 4.8 4.7  --  4.6  CL 109 103  --  102  CO2 21  --   --  24  GLUCOSE 167* 133*  --  128*  BUN 17 18  --  18  CREATININE 1.28 1.40* 1.20 0.97  CALCIUM 7.6*  --   --  7.8*    PT/INR:  Recent Labs  08/27/13 1744  LABPROT 19.5*  INR 1.70*   ABG    Component Value Date/Time   PHART 7.283* 08/28/2013 0325   HCO3 19.9* 08/28/2013 0325   TCO2 23 08/28/2013 1728   ACIDBASEDEF 6.0* 08/28/2013 0325   O2SAT 99.0 08/28/2013 0325   CBG (last 3)    Recent Labs  08/28/13 2351 08/29/13 0356 08/29/13 0721  GLUCAP 115* 121* 112*    Assessment/Plan: S/P Procedure(s) (LRB): CORONARY ARTERY BYPASS GRAFTING (CABG) times two using left internal mammary and right saphenous vein using endoscope. (N/A)  Expected postop anemia, mild hypotension We'll start oral iron and folate Hold Lasix until pressors off, weight up 10 pounds Keep it SICU today   LOS: 2 days    VAN TRIGT III,PETER 08/29/2013

## 2013-08-29 NOTE — Progress Notes (Signed)
Hypoglycemic Event  CBG: 65  Treatment: 15 GM carbohydrate snack  Symptoms: None  Follow-up CBG: Time:1955  CBG Result:74 Possible Reasons for Event: Inadequate meal intake  Comments/MD notified: encouraged incr. PO intake    Jeri Modena  Remember to initiate Hypoglycemia Order Set & complete

## 2013-08-29 NOTE — Progress Notes (Signed)
CT surgery p.m. Rounds  Patient's nausea and dizziness improved. Still having episodes of blood pressure in the low 80s but dopamine reduced since this morning.. Maintaining sinus rhythm. Walkedin ICU this evening. Doing well

## 2013-08-29 NOTE — Progress Notes (Signed)
Attempt to assist pt. With ambulation. Pt. Refuses at this time stating that he does not feel well. Pt. Becomes hostile with requests to return to bed. Nursing assists with ambulation to bed and reinforces need for ambulation and need for staying out of bed. Will continue to monitor, educate, assess closely and reinforce ambulation with pt. [name]

## 2013-08-29 NOTE — Progress Notes (Signed)
Attempted to assist pt. With ambulation. Pt refuses at this time. Nursing explains importance of ambulation to pt. And he continues to refuse at this time. States he does not feel well and wishes to go back to bed. After nursing explains importance of staying in chair and out of bed, pt. Agrees to stay in chair at this time. Will continue to monitor, educate, assess closely. Gildardo Pounds

## 2013-08-30 ENCOUNTER — Inpatient Hospital Stay (HOSPITAL_COMMUNITY): Payer: Medicare Other

## 2013-08-30 DIAGNOSIS — J90 Pleural effusion, not elsewhere classified: Secondary | ICD-10-CM | POA: Diagnosis not present

## 2013-08-30 DIAGNOSIS — J9819 Other pulmonary collapse: Secondary | ICD-10-CM | POA: Diagnosis not present

## 2013-08-30 DIAGNOSIS — I2510 Atherosclerotic heart disease of native coronary artery without angina pectoris: Secondary | ICD-10-CM | POA: Diagnosis not present

## 2013-08-30 LAB — GLUCOSE, CAPILLARY
Glucose-Capillary: 102 mg/dL — ABNORMAL HIGH (ref 70–99)
Glucose-Capillary: 103 mg/dL — ABNORMAL HIGH (ref 70–99)
Glucose-Capillary: 118 mg/dL — ABNORMAL HIGH (ref 70–99)
Glucose-Capillary: 81 mg/dL (ref 70–99)
Glucose-Capillary: 93 mg/dL (ref 70–99)
Glucose-Capillary: 96 mg/dL (ref 70–99)

## 2013-08-30 LAB — POCT I-STAT 3, ART BLOOD GAS (G3+)
Acid-base deficit: 2 mmol/L (ref 0.0–2.0)
Bicarbonate: 22.1 mEq/L (ref 20.0–24.0)
O2 Saturation: 98 %
Patient temperature: 35.1
TCO2: 23 mmol/L (ref 0–100)
pCO2 arterial: 31 mmHg — ABNORMAL LOW (ref 35.0–45.0)
pH, Arterial: 7.454 — ABNORMAL HIGH (ref 7.350–7.450)
pO2, Arterial: 88 mmHg (ref 80.0–100.0)

## 2013-08-30 LAB — BASIC METABOLIC PANEL
BUN: 21 mg/dL (ref 6–23)
CO2: 26 mEq/L (ref 19–32)
Calcium: 8.3 mg/dL — ABNORMAL LOW (ref 8.4–10.5)
Chloride: 100 mEq/L (ref 96–112)
Creatinine, Ser: 1.08 mg/dL (ref 0.50–1.35)
GFR calc Af Amer: 76 mL/min — ABNORMAL LOW (ref >90)
GFR calc non Af Amer: 65 mL/min — ABNORMAL LOW (ref >90)
Glucose, Bld: 112 mg/dL — ABNORMAL HIGH (ref 70–99)
Potassium: 4.5 mEq/L (ref 3.5–5.1)
Sodium: 133 mEq/L — ABNORMAL LOW (ref 135–145)

## 2013-08-30 LAB — CBC
HCT: 21.5 % — ABNORMAL LOW (ref 39.0–52.0)
Hemoglobin: 7.6 g/dL — ABNORMAL LOW (ref 13.0–17.0)
MCH: 35.2 pg — ABNORMAL HIGH (ref 26.0–34.0)
MCHC: 35.3 g/dL (ref 30.0–36.0)
MCV: 99.5 fL (ref 78.0–100.0)
Platelets: 66 10*3/uL — ABNORMAL LOW (ref 150–400)
RBC: 2.16 MIL/uL — ABNORMAL LOW (ref 4.22–5.81)
RDW: 12.6 % (ref 11.5–15.5)
WBC: 6.8 10*3/uL (ref 4.0–10.5)

## 2013-08-30 LAB — PREPARE RBC (CROSSMATCH)

## 2013-08-30 LAB — POCT I-STAT 4, (NA,K, GLUC, HGB,HCT)
Glucose, Bld: 82 mg/dL (ref 70–99)
HCT: 26 % — ABNORMAL LOW (ref 39.0–52.0)
Hemoglobin: 8.8 g/dL — ABNORMAL LOW (ref 13.0–17.0)
Potassium: 3.9 mEq/L (ref 3.5–5.1)
Sodium: 138 mEq/L (ref 135–145)

## 2013-08-30 MED ORDER — ALBUMIN HUMAN 5 % IV SOLN
INTRAVENOUS | Status: AC
  Administered 2013-08-30: 12.5 g via INTRAVENOUS
  Filled 2013-08-30: qty 250

## 2013-08-30 MED ORDER — ALBUMIN HUMAN 5 % IV SOLN
12.5000 g | Freq: Once | INTRAVENOUS | Status: AC
  Administered 2013-08-30: 12.5 g via INTRAVENOUS

## 2013-08-30 MED ORDER — ALBUMIN HUMAN 25 % IV SOLN: 50.0000 g | Freq: Once | INTRAVENOUS | Status: DC

## 2013-08-30 MED ORDER — FUROSEMIDE 10 MG/ML IJ SOLN
40.0000 mg | Freq: Once | INTRAMUSCULAR | Status: AC
  Administered 2013-08-30: 40 mg via INTRAVENOUS
  Filled 2013-08-30: qty 4

## 2013-08-30 MED ORDER — INSULIN ASPART 100 UNIT/ML ~~LOC~~ SOLN: 0.0000 [IU] | Freq: Three times a day (TID) | SUBCUTANEOUS | Status: DC

## 2013-08-30 MED FILL — Sodium Bicarbonate IV Soln 8.4%: INTRAVENOUS | Qty: 50 | Status: AC

## 2013-08-30 MED FILL — Heparin Sodium (Porcine) Inj 1000 Unit/ML: INTRAMUSCULAR | Qty: 10 | Status: AC

## 2013-08-30 MED FILL — Electrolyte-R (PH 7.4) Solution: INTRAVENOUS | Qty: 4000 | Status: AC

## 2013-08-30 MED FILL — Lidocaine HCl IV Inj 20 MG/ML: INTRAVENOUS | Qty: 5 | Status: AC

## 2013-08-30 MED FILL — Mannitol IV Soln 20%: INTRAVENOUS | Qty: 500 | Status: AC

## 2013-08-30 MED FILL — Sodium Chloride Irrigation Soln 0.9%: Qty: 3000 | Status: AC

## 2013-08-30 NOTE — Progress Notes (Signed)
Patient ID: [name], male   DOB: 04-13-1936, 77 y.o.   MRN: [id number] EVENING ROUNDS NOTE :     [address]       [place] [id number]             [phone]                 3 Days Post-Op Procedure(s) (LRB): CORONARY ARTERY BYPASS GRAFTING (CABG) times two using left internal mammary and right saphenous vein using endoscope. (N/A)  Total Length of Stay:  LOS: 3 days  BP 98/51  Pulse 75  Temp(Src) 98.8 F (37.1 C) (Oral)  Resp 21  Ht 5' 8.5" (1.74 m)  Wt 166 lb 14.2 oz (75.7 kg)  BMI 25 kg/m2  SpO2 100%  .Intake/Output     09/29 0701 - 09/30 0700   P.O. 240   I.V. (mL/kg) 144.3 (1.9)   Blood 312.5   IV Piggyback    Total Intake(mL/kg) 696.8 (9.2)   Urine (mL/kg/hr) 1320 (1.2)   Total Output 1320   Net -623.2         . sodium chloride 20 mL/hr at 08/27/13 1800  . sodium chloride 20 mL/hr at 08/30/13 1900  . sodium chloride    . DOPamine Stopped (08/30/13 1005)  . lactated ringers 40 mL/hr at 08/27/13 2007     Lab Results  Component Value Date   WBC 6.8 08/30/2013   HGB 7.6* 08/30/2013   HCT 21.5* 08/30/2013   PLT 66* 08/30/2013   GLUCOSE 112* 08/30/2013   CHOL 127 05/10/2013   TRIG 114.0 05/10/2013   HDL 42.00 05/10/2013   LDLCALC 62 05/10/2013   ALT 18 05/10/2013   AST 18 05/10/2013   NA 133* 08/30/2013   K 4.5 08/30/2013   CL 100 08/30/2013   CREATININE 1.08 08/30/2013   BUN 21 08/30/2013   CO2 26 08/30/2013   TSH 0.93 10/28/2012   PSA 0.07* 10/31/2010   INR 1.70* 08/27/2013   Walking around unit well   Delight Ovens MD  Beeper [phone] Office [phone] 08/30/2013 9:09 PM

## 2013-08-30 NOTE — Anesthesia Postprocedure Evaluation (Signed)
  Anesthesia Post-op Note  Patient: [name]  Procedure(s) Performed: Procedure(s): CORONARY ARTERY BYPASS GRAFTING (CABG) times two using left internal mammary and right saphenous vein using endoscope. (N/A)  Patient Location: PACU and SICU  Anesthesia Type:General  Level of Consciousness: awake, alert , oriented and patient cooperative  Airway and Oxygen Therapy: Patient Spontanous Breathing and Patient connected to nasal cannula oxygen  Post-op Pain: mild  Post-op Assessment: Post-op Vital signs reviewed and Patient's Cardiovascular Status Stable  Post-op Vital Signs: Reviewed and stable  Complications: No apparent anesthesia complications

## 2013-08-30 NOTE — Progress Notes (Signed)
3 Days Post-Op Procedure(s) (LRB): CORONARY ARTERY BYPASS GRAFTING (CABG) times two using left internal mammary and right saphenous vein using endoscope. (N/A) Subjective: Denies pain and nausea  Objective: Vital signs in last 24 hours: Temp:  [98.1 F (36.7 C)-98.6 F (37 C)] 98.4 F (36.9 C) (09/29 0721) Pulse Rate:  [78-110] 102 (09/29 0800) Cardiac Rhythm:  [-] Normal sinus rhythm;Sinus tachycardia (09/29 0800) Resp:  [11-26] 20 (09/29 0800) BP: (91-149)/(40-84) 107/47 mmHg (09/29 0752) SpO2:  [89 %-100 %] 90 % (09/29 0800) Weight:  [166 lb 14.2 oz (75.7 kg)] 166 lb 14.2 oz (75.7 kg) (09/29 0600)  Hemodynamic parameters for last 24 hours:    Intake/Output from previous day: 09/28 0701 - 09/29 0700 In: 2258.1 [P.O.:1430; I.V.:578.1; IV Piggyback:250] Out: 2160 [Urine:2160] Intake/Output this shift:    General appearance: alert and no distress Neurologic: intact Heart: regular rate and rhythm Lungs: diminished breath sounds bibasilar Abdomen: normal findings: soft, non-tender  Lab Results:  Recent Labs  08/29/13 0356 08/30/13 0411  WBC 8.1 6.8  HGB 8.3* 7.6*  HCT 23.2* 21.5*  PLT 70* 66*   BMET:  Recent Labs  08/29/13 0356 08/30/13 0411  NA 134* 133*  K 4.6 4.5  CL 102 100  CO2 24 26  GLUCOSE 128* 112*  BUN 18 21  CREATININE 0.97 1.08  CALCIUM 7.8* 8.3*    PT/INR:  Recent Labs  08/27/13 1744  LABPROT 19.5*  INR 1.70*   ABG    Component Value Date/Time   PHART 7.283* 08/28/2013 0325   HCO3 19.9* 08/28/2013 0325   TCO2 23 08/28/2013 1728   ACIDBASEDEF 6.0* 08/28/2013 0325   O2SAT 99.0 08/28/2013 0325   CBG (last 3)   Recent Labs  08/29/13 2355 08/30/13 0402 08/30/13 0719  GLUCAP 103* 81 93    Assessment/Plan: S/P Procedure(s) (LRB): CORONARY ARTERY BYPASS GRAFTING (CABG) times two using left internal mammary and right saphenous vein using endoscope. (N/A) - CV- still on a little dopamine- will wean off  RESP- bibasilar atelectasis-  continue IS  RENAL- creatinine and lytes OK, diurese as BP allows  ANEMIA- secondary to ABL- will transfuse this AM to assist with weaning off dopamine and diuresis  THROMBOCYTOPENIA- stable, follow, no heparin  Possible transfer to 2000 later today   LOS: 3 days    HENDRICKSON,STEVEN C 08/30/2013

## 2013-08-30 NOTE — Progress Notes (Signed)
PROGRESS NOTE  Subjective:   [name] is doing well after urgent CABG for critical LM disease.    Objective:    Vital Signs:   Temp:  [98.1 F (36.7 C)-98.6 F (37 C)] 98.4 F (36.9 C) (09/29 0721) Pulse Rate:  [78-110] 110 (09/29 0752) Resp:  [11-26] 17 (09/29 0600) BP: (91-149)/(40-84) 107/47 mmHg (09/29 0752) SpO2:  [89 %-100 %] 95 % (09/29 0752) Weight:  [166 lb 14.2 oz (75.7 kg)] 166 lb 14.2 oz (75.7 kg) (09/29 0600)      24-hour weight change: Weight change: -3 lb 4.9 oz (-1.5 kg)  Weight trends: Filed Weights   08/28/13 0400 08/29/13 0500 08/30/13 0600  Weight: 171 lb 4.8 oz (77.7 kg) 170 lb 3.1 oz (77.2 kg) 166 lb 14.2 oz (75.7 kg)    Intake/Output:  09/28 0701 - 09/29 0700 In: 2258.1 [P.O.:1430; I.V.:578.1; IV Piggyback:250] Out: 2160 [Urine:2160]     Physical Exam: BP 107/47  Pulse 110  Temp(Src) 98.4 F (36.9 C) (Oral)  Resp 17  Ht 5' 8.5" (1.74 m)  Wt 166 lb 14.2 oz (75.7 kg)  BMI 25 kg/m2  SpO2 95%  General: Vital signs reviewed and noted.   Head: Normocephalic, atraumatic.  Eyes: conjunctivae/corneas clear.  EOM's intact.   Throat: normal  Neck:  normal  Lungs:  clear anteriorly  Heart:  RR, normal S1, S2  Abdomen:  Soft, non-tender, non-distended    Extremities:  no edema  Neurologic: A&O X3, CN II - XII are grossly intact.   Psych: Normal , sleepy     Labs: BMET:  Recent Labs  08/28/13 0300  08/28/13 1730 08/29/13 0356 08/30/13 0411  NA 139  < >  --  134* 133*  K 4.8  < >  --  4.6 4.5  CL 109  < >  --  102 100  CO2 21  --   --  24 26  GLUCOSE 167*  < >  --  128* 112*  BUN 17  < >  --  18 21  CREATININE 1.28  < > 1.20 0.97 1.08  CALCIUM 7.6*  --   --  7.8* 8.3*  MG 2.7*  --  2.5  --   --   < > = values in this interval not displayed.  Liver function tests: No results found for this basename: AST, ALT, ALKPHOS, BILITOT, PROT, ALBUMIN,  in the last 72 hours No results found for this basename: LIPASE, AMYLASE,  in  the last 72 hours  CBC:  Recent Labs  08/29/13 0356 08/30/13 0411  WBC 8.1 6.8  HGB 8.3* 7.6*  HCT 23.2* 21.5*  MCV 100.0 99.5  PLT 70* 66*    Cardiac Enzymes: No results found for this basename: CKTOTAL, CKMB, TROPONINI,  in the last 72 hours  Coagulation Studies:  Recent Labs  08/27/13 1744  LABPROT 19.5*  INR 1.70*    Other: No components found with this basename: POCBNP,  No results found for this basename: DDIMER,  in the last 72 hours No results found for this basename: HGBA1C,  in the last 72 hours No results found for this basename: CHOL, HDL, LDLCALC, TRIG, CHOLHDL,  in the last 72 hours No results found for this basename: TSH, T4TOTAL, FREET3, T3FREE, THYROIDAB,  in the last 72 hours No results found for this basename: VITAMINB12, FOLATE, FERRITIN, TIBC, IRON, RETICCTPCT,  in the last 72 hours   Other results:  Tele:  NSR  Medications:  Infusions: . sodium chloride 20 mL/hr at 08/27/13 1800  . sodium chloride 20 mL (08/28/13 0220)  . sodium chloride    . DOPamine 1 mcg/kg/min (08/30/13 0600)  . lactated ringers 40 mL/hr at 08/27/13 2007    Scheduled Medications: . acetaminophen  1,000 mg Oral Q6H   Or  . acetaminophen (TYLENOL) oral liquid 160 mg/5 mL  1,000 mg Per Tube Q6H  . aspirin EC  325 mg Oral Daily   Or  . aspirin  324 mg Per Tube Daily  . atorvastatin  40 mg Oral Daily  . bisacodyl  10 mg Oral Daily   Or  . bisacodyl  10 mg Rectal Daily  . docusate sodium  200 mg Oral Daily  . ferrous fumarate-b12-vitamic C-folic acid  1 capsule Oral TID PC  . insulin aspart  0-24 Units Subcutaneous Q4H  . insulin aspart  3 Units Subcutaneous TID WC  . metoprolol tartrate  12.5 mg Oral BID   Or  . metoprolol tartrate  12.5 mg Per Tube BID  . pantoprazole  40 mg Oral Daily  . sodium chloride  3 mL Intravenous Q12H  . tamsulosin  0.4 mg Oral Daily    Assessment/ Plan:    1. CAD:  Doing well after CABG on Friday.   His Hb is 7. 6. Agree with  plans for transfusion. Following.   Vesta Mixer, Montez Hageman., MD, [facility] 08/30/2013, 8:13 AM Office [phone] Pager [phone]

## 2013-08-30 NOTE — Progress Notes (Signed)
Physical Therapy Treatment Patient Details Name: [name] MRN: [id number] DOB: [birth year] Today's Date: 08/30/2013 Time: 0724-0807 PT Time Calculation (min): 43 min  PT Assessment / Plan / Recommendation  History of Present Illness pt presents with CABG x2.     PT Comments   Pt with excellent progress with mobility today but continues to demonstrate decreased processing, problem solving and lack of carry over for awareness of precautions despite education with teach back. Pt educated for HEP and encouraged to continue ambulation with nursing. Will follow  Follow Up Recommendations        Does the patient have the potential to tolerate intense rehabilitation     Barriers to Discharge        Equipment Recommendations       Recommendations for Other Services    Frequency     Progress towards PT Goals Progress towards PT goals: Progressing toward goals  Plan Current plan remains appropriate    Precautions / Restrictions Precautions Precautions: Sternal;Fall Precaution Comments: used teach back with pt able to state 3 at end of session   Pertinent Vitals/Pain sats 92-95% on RA HR 98-123 with gait BP 103/49 (60) beginning of session 107/47 (62) end of session  Pt did report mild dizziness after stair ambulation Pain 4/10 to 3/10 end of session, sternal pain, repositioned    Mobility  Bed Mobility Bed Mobility: Rolling Left;Left Sidelying to Sit;Sitting - Scoot to Edge of Bed Rolling Left: 4: Min assist Left Sidelying to Sit: 4: Min assist;HOB flat Sitting - Scoot to [place] of Bed: 4: Min guard Details for Bed Mobility Assistance: cueing for sequence, hand placement, precautions and safety Transfers Sit to Stand: 4: Min guard;From bed;From chair/3-in-1;From toilet Stand to Sit: 4: Min guard;To toilet;To chair/3-in-1 Details for Transfer Assistance: cueing for hand placemnet, precautions and sequence, pt without carry over between trials and surfaces. pt with tendency to  over or under shoot target with positioning and required cueing to achieve correct positioning Ambulation/Gait Ambulation/Gait Assistance: 4: Min assist Ambulation Distance (Feet): 400 Feet (400, 15, 15) Assistive device: Rolling walker Ambulation/Gait Assistance Details: cueing for posture, position in RW and looking up with gait. min assist for turns as pt having difficulty with directions with transitions and maneuvering around obstacles Gait Pattern: Step-through pattern;Decreased stride length Stairs: Yes Stairs Assistance: 4: Min guard Stair Management Technique: Step to pattern;Forwards;One rail Right Number of Stairs: 6    Exercises General Exercises - Lower Extremity Long Arc Quad: AROM;Both;10 reps;Seated Hip Flexion/Marching: AROM;Both;10 reps;Seated   PT Diagnosis:    PT Problem List:   PT Treatment Interventions:     PT Goals (current goals can now be found in the care plan section)    Visit Information  Last PT Received On: 08/30/13 Assistance Needed: +1 History of Present Illness: pt presents with CABG x2.      Subjective Data      Cognition  Cognition Arousal/Alertness: Awake/alert Behavior During Therapy: Flat affect Overall Cognitive Status: Impaired/Different from baseline Area of Impairment: Memory;Problem solving Memory: Decreased short-term memory;Decreased recall of precautions Problem Solving: Slow processing    Balance     End of Session PT - End of Session Equipment Utilized During Treatment: Gait belt Activity Tolerance: Patient tolerated treatment well Patient left: in chair Nurse Communication: Mobility status   GP     Toney Sang Beth 08/30/2013, 8:08 AM Delaney Meigs, PT [phone]

## 2013-08-30 NOTE — Anesthesia Postprocedure Evaluation (Signed)
  Anesthesia Post-op Note  Patient: [name]  Procedure(s) Performed: Procedure(s): CORONARY ARTERY BYPASS GRAFTING (CABG) times two using left internal mammary and right saphenous vein using endoscope. (N/A)  Patient Location: PACU and SICU  Anesthesia Type:General  Level of Consciousness: awake, alert , oriented and patient cooperative  Airway and Oxygen Therapy: Patient Spontanous Breathing and Patient connected to nasal cannula oxygen  Post-op Pain: mild  Post-op Assessment: Post-op Vital signs reviewed and Patient's Cardiovascular Status Stable  Post-op Vital Signs: Reviewed and stable  Complications: No apparent anesthesia complications 

## 2013-08-31 ENCOUNTER — Encounter (HOSPITAL_COMMUNITY): Payer: Self-pay | Admitting: Thoracic Surgery (Cardiothoracic Vascular Surgery)

## 2013-08-31 LAB — TYPE AND SCREEN
ABO/RH(D): AB POS
Antibody Screen: NEGATIVE
Unit division: 0
Unit division: 0
Unit division: 0
Unit division: 0

## 2013-08-31 LAB — BASIC METABOLIC PANEL
BUN: 26 mg/dL — ABNORMAL HIGH (ref 6–23)
CO2: 25 mEq/L (ref 19–32)
Calcium: 8.1 mg/dL — ABNORMAL LOW (ref 8.4–10.5)
Chloride: 101 mEq/L (ref 96–112)
Creatinine, Ser: 1.42 mg/dL — ABNORMAL HIGH (ref 0.50–1.35)
GFR calc Af Amer: 54 mL/min — ABNORMAL LOW (ref >90)
GFR calc non Af Amer: 47 mL/min — ABNORMAL LOW (ref >90)
Glucose, Bld: 89 mg/dL (ref 70–99)
Potassium: 4.1 mEq/L (ref 3.5–5.1)
Sodium: 134 mEq/L — ABNORMAL LOW (ref 135–145)

## 2013-08-31 LAB — CBC
HCT: 25.4 % — ABNORMAL LOW (ref 39.0–52.0)
Hemoglobin: 9 g/dL — ABNORMAL LOW (ref 13.0–17.0)
MCH: 34.1 pg — ABNORMAL HIGH (ref 26.0–34.0)
MCHC: 35.4 g/dL (ref 30.0–36.0)
MCV: 96.2 fL (ref 78.0–100.0)
Platelets: 89 10*3/uL — ABNORMAL LOW (ref 150–400)
RBC: 2.64 MIL/uL — ABNORMAL LOW (ref 4.22–5.81)
RDW: 15.7 % — ABNORMAL HIGH (ref 11.5–15.5)
WBC: 6.9 10*3/uL (ref 4.0–10.5)

## 2013-08-31 LAB — GLUCOSE, CAPILLARY
Glucose-Capillary: 85 mg/dL (ref 70–99)
Glucose-Capillary: 97 mg/dL (ref 70–99)
Glucose-Capillary: 124 mg/dL — ABNORMAL HIGH (ref 70–99)

## 2013-08-31 MED ORDER — MAGNESIUM HYDROXIDE 400 MG/5ML PO SUSP: 30.0000 mL | Freq: Every day | ORAL | Status: DC | PRN

## 2013-08-31 MED ORDER — SODIUM CHLORIDE 0.9 % IJ SOLN: 3.0000 mL | INTRAMUSCULAR | Status: DC | PRN

## 2013-08-31 MED ORDER — POTASSIUM CHLORIDE CRYS ER 20 MEQ PO TBCR
20.0000 meq | EXTENDED_RELEASE_TABLET | Freq: Every day | ORAL | Status: DC
  Administered 2013-08-31 – 2013-09-02 (×3): 20 meq via ORAL
  Filled 2013-08-31 (×3): qty 1

## 2013-08-31 MED ORDER — POLYETHYLENE GLYCOL 3350 17 G PO PACK
17.0000 g | PACK | Freq: Every day | ORAL | Status: DC | PRN
  Filled 2013-08-31: qty 1

## 2013-08-31 MED ORDER — ALUM & MAG HYDROXIDE-SIMETH 200-200-20 MG/5ML PO SUSP: 15.0000 mL | ORAL | Status: DC | PRN

## 2013-08-31 MED ORDER — MOVING RIGHT ALONG BOOK
Freq: Once | Status: AC
  Administered 2013-08-31: 12:00:00
  Filled 2013-08-31: qty 1

## 2013-08-31 MED ORDER — ZOLPIDEM TARTRATE 5 MG PO TABS: 5.0000 mg | ORAL_TABLET | Freq: Every evening | ORAL | Status: DC | PRN

## 2013-08-31 MED ORDER — ALPRAZOLAM 0.25 MG PO TABS: 0.2500 mg | ORAL_TABLET | Freq: Four times a day (QID) | ORAL | Status: DC | PRN

## 2013-08-31 MED ORDER — FUROSEMIDE 40 MG PO TABS
40.0000 mg | ORAL_TABLET | Freq: Every day | ORAL | Status: DC
  Administered 2013-08-31 – 2013-09-02 (×3): 40 mg via ORAL
  Filled 2013-08-31 (×3): qty 1

## 2013-08-31 MED ORDER — SODIUM CHLORIDE 0.9 % IJ SOLN
3.0000 mL | Freq: Two times a day (BID) | INTRAMUSCULAR | Status: DC
Start: 2013-08-31 — End: 2013-09-02
  Administered 2013-08-31 – 2013-09-01 (×3): 3 mL via INTRAVENOUS

## 2013-08-31 MED ORDER — GUAIFENESIN-DM 100-10 MG/5ML PO SYRP: 15.0000 mL | ORAL_SOLUTION | ORAL | Status: DC | PRN

## 2013-08-31 MED ORDER — SODIUM CHLORIDE 0.9 % IV SOLN: 250.0000 mL | INTRAVENOUS | Status: DC | PRN

## 2013-08-31 MED ORDER — POLYETHYLENE GLYCOL 3350 17 G PO PACK
17.0000 g | PACK | Freq: Once | ORAL | Status: DC
  Filled 2013-08-31: qty 1

## 2013-08-31 MED ORDER — METOPROLOL SUCCINATE ER 25 MG PO TB24
25.0000 mg | ORAL_TABLET | Freq: Every day | ORAL | Status: DC
  Administered 2013-08-31 – 2013-09-02 (×3): 25 mg via ORAL
  Filled 2013-08-31 (×3): qty 1

## 2013-08-31 NOTE — Progress Notes (Signed)
CARDIAC REHAB PHASE I   PRE:  Rate/Rhythm: 79 SR    BP: sitting 102/52    SaO2: 95 RA  MODE:  Ambulation: 500 ft   POST:  Rate/Rhythm: 108 ST    BP: sitting 112/56     SaO2: 97 RA  Pt unsteady when he got OOB after being awakened from sleep. This resolved. Able to walk independently with RW. Sts he has RW at home. Tolerated well, no c/o. To recliner. Pt fairly flat, sts he is still in "shock" from all of this. Will f/u.  [id number]  Elissa Lovett Shubuta CES, ACSM 08/31/2013 3:28 PM

## 2013-08-31 NOTE — Progress Notes (Addendum)
      [address]       [place] [address]             [phone]      4 Days Post-Op Procedure(s) (LRB): CORONARY ARTERY BYPASS GRAFTING (CABG) times two using left internal mammary and right saphenous vein using endoscope. (N/A)  Subjective:  Mr. Arduini has no new complaints this morning.  He is ambulating without difficulty.   Objective: Vital signs in last 24 hours: Temp:  [97.6 F (36.4 C)-98.8 F (37.1 C)] 97.9 F (36.6 C) (09/30 0730) Pulse Rate:  [48-110] 74 (09/30 0700) Cardiac Rhythm:  [-] Normal sinus rhythm (09/30 0200) Resp:  [11-36] 15 (09/30 0700) BP: (89-121)/(43-88) 118/60 mmHg (09/30 0700) SpO2:  [85 %-100 %] 100 % (09/30 0700) Weight:  [169 lb 12.1 oz (77 kg)] 169 lb 12.1 oz (77 kg) (09/30 0600)  Intake/Output from previous day: 09/29 0701 - 09/30 0700 In: 2156.8 [P.O.:300; I.V.:1544.3; Blood:312.5] Out: 1720 [Urine:1720]  General appearance: alert, cooperative and no distress Heart: regular rate and rhythm Lungs: clear to auscultation bilaterally Abdomen: soft, non-tender; bowel sounds normal; no masses,  no organomegaly Extremities: edema trace Wound: clean and d ry  Lab Results:  Recent Labs  08/30/13 0411 08/31/13 0500  WBC 6.8 6.9  HGB 7.6* 9.0*  HCT 21.5* 25.4*  PLT 66* 89*   BMET:  Recent Labs  08/30/13 0411 08/31/13 0500  NA 133* 134*  K 4.5 4.1  CL 100 101  CO2 26 25  GLUCOSE 112* 89  BUN 21 26*  CREATININE 1.08 1.42*  CALCIUM 8.3* 8.1*    PT/INR: No results found for this basename: LABPROT, INR,  in the last 72 hours ABG    Component Value Date/Time   PHART 7.283* 08/28/2013 0325   HCO3 19.9* 08/28/2013 0325   TCO2 23 08/28/2013 1728   ACIDBASEDEF 6.0* 08/28/2013 0325   O2SAT 99.0 08/28/2013 0325   CBG (last 3)   Recent Labs  08/30/13 1145 08/30/13 1528 08/30/13 2123  GLUCAP 96 118* 102*    Assessment/Plan: S/P Procedure(s) (LRB): CORONARY ARTERY BYPASS GRAFTING (CABG) times two using  left internal mammary and right saphenous vein using endoscope. (N/A)  1. CV- NSR good rate and pressure control- off Dopamine- continue Lopressor 2. Pulm- wean oxygen as tolerated, encouraged use of IS 3. Renal- creatinine trending up, at 1.42 currently, mildly volume overloaded will order PO Lasix, repeat BMET in AM 4. Acute Blood Loss Anemia- improved after transfusion, Hgb at 9.0 5. Dipso- patient stable, off drips, need to watch creatinine, transfer to 2000    LOS: 4 days    BARRETT, ERIN 08/31/2013  Patient seen and examined, agree with above Looks better this AM Will transfer to 2000

## 2013-08-31 NOTE — Progress Notes (Signed)
Physical Therapy Treatment Patient Details Name: [name] MRN: [id number] DOB: 1936/05/07 Today's Date: 08/31/2013 Time: 1191-4782 PT Time Calculation (min): 26 min  PT Assessment / Plan / Recommendation  History of Present Illness pt presents with CABG x2.     PT Comments   Pt with excellent progress today able to recall sequence of transfer, ambulate and safely perform all mobility with cues. Pt maintained sats >93% on RA throughout. Used teach back and handout to reiterate precautions.   Follow Up Recommendations  Home health PT     Does the patient have the potential to tolerate intense rehabilitation     Barriers to Discharge        Equipment Recommendations       Recommendations for Other Services    Frequency     Progress towards PT Goals Progress towards PT goals: Progressing toward goals  Plan Discharge plan needs to be updated    Precautions / Restrictions Precautions Precautions: Sternal Precaution Comments: used teach back with pt able to state 3 at end of session   Pertinent Vitals/Pain sats 94-99% on RA HR 71-82 No pain    Mobility  Bed Mobility Bed Mobility: Sit to Sidelying Right Left Sidelying to Sit: 4: Min assist;HOB flat Details for Bed Mobility Assistance: cueing for sequence precautions and safety with assist to elevate legs to bed Transfers Sit to Stand: 4: Min guard;From toilet;From chair/3-in-1 Stand to Sit: 4: Min guard;To toilet;To bed Details for Transfer Assistance: cueing for hand placemnet, precautions and sequence, with excellent improvement over positioning from yesterday Ambulation/Gait Ambulation/Gait Assistance: 5: Supervision Ambulation Distance (Feet): 600 Feet Assistive device: Rolling walker Ambulation/Gait Assistance Details: cueing to look up and for posture. pt denied attempting without RW today Gait Pattern: Within Functional Limits Gait velocity: WFL Stairs: No    Exercises General Exercises - Lower  Extremity Long Arc Quad: AROM;Both;Seated;15 reps Hip Flexion/Marching: AROM;Both;Seated;15 reps   PT Diagnosis:    PT Problem List:   PT Treatment Interventions:     PT Goals (current goals can now be found in the care plan section)    Visit Information  Last PT Received On: 08/31/13 Assistance Needed: +1 History of Present Illness: pt presents with CABG x2.      Subjective Data      Cognition  Cognition Arousal/Alertness: Awake/alert Behavior During Therapy: WFL for tasks assessed/performed Overall Cognitive Status: Within Functional Limits for tasks assessed Memory: Decreased recall of precautions    Balance     End of Session PT - End of Session Equipment Utilized During Treatment: Gait belt Activity Tolerance: Patient tolerated treatment well Patient left: in bed;with call bell/phone within reach Nurse Communication: Mobility status   GP     Toney Sang Beth 08/31/2013, 10:09 AM Delaney Meigs, PT [phone]

## 2013-09-01 ENCOUNTER — Inpatient Hospital Stay (HOSPITAL_COMMUNITY): Payer: Medicare Other

## 2013-09-01 DIAGNOSIS — J90 Pleural effusion, not elsewhere classified: Secondary | ICD-10-CM | POA: Diagnosis not present

## 2013-09-01 LAB — CBC
HCT: 29.5 % — ABNORMAL LOW (ref 39.0–52.0)
Hemoglobin: 10.1 g/dL — ABNORMAL LOW (ref 13.0–17.0)
MCH: 33 pg (ref 26.0–34.0)
MCHC: 34.2 g/dL (ref 30.0–36.0)
MCV: 96.4 fL (ref 78.0–100.0)
Platelets: 128 10*3/uL — ABNORMAL LOW (ref 150–400)
RBC: 3.06 MIL/uL — ABNORMAL LOW (ref 4.22–5.81)
RDW: 15 % (ref 11.5–15.5)
WBC: 6.3 10*3/uL (ref 4.0–10.5)

## 2013-09-01 LAB — GLUCOSE, CAPILLARY: Glucose-Capillary: 103 mg/dL — ABNORMAL HIGH (ref 70–99)

## 2013-09-01 LAB — BASIC METABOLIC PANEL
BUN: 25 mg/dL — ABNORMAL HIGH (ref 6–23)
CO2: 26 mEq/L (ref 19–32)
Calcium: 8.7 mg/dL (ref 8.4–10.5)
Chloride: 100 mEq/L (ref 96–112)
Creatinine, Ser: 1.43 mg/dL — ABNORMAL HIGH (ref 0.50–1.35)
GFR calc Af Amer: 54 mL/min — ABNORMAL LOW (ref >90)
GFR calc non Af Amer: 46 mL/min — ABNORMAL LOW (ref >90)
Glucose, Bld: 100 mg/dL — ABNORMAL HIGH (ref 70–99)
Potassium: 4 mEq/L (ref 3.5–5.1)
Sodium: 136 mEq/L (ref 135–145)

## 2013-09-01 LAB — HEPARIN INDUCED THROMBOCYTOPENIA PNL
Heparin Induced Plt Ab: NEGATIVE
Patient O.D.: 0.054
UFH High Dose UFH H: 0 % Release
UFH Low Dose 0.1 IU/mL: 0 % Release
UFH Low Dose 0.5 IU/mL: 0 % Release
UFH SRA Result: NEGATIVE

## 2013-09-01 NOTE — Progress Notes (Addendum)
[address]       [place] [address]             [phone]      5 Days Post-Op  Procedure(s) (LRB): CORONARY ARTERY BYPASS GRAFTING (CABG) times two using left internal mammary and right saphenous vein using endoscope. (N/A) Subjective: Feels pretty well , somewhat weak but improving  Objective  Telemetry sinus rhythm/sinus tach  Temp:  [97.3 F (36.3 C)-98.8 F (37.1 C)] 98.5 F (36.9 C) (10/01 0500) Pulse Rate:  [70-91] 91 (10/01 0500) Resp:  [12-20] 18 (10/01 0500) BP: (105-135)/(56-64) 124/61 mmHg (10/01 0500) SpO2:  [96 %-99 %] 98 % (10/01 0500) Weight:  [170 lb 10.2 oz (77.4 kg)] 170 lb 10.2 oz (77.4 kg) (10/01 0500)   Intake/Output Summary (Last 24 hours) at 09/01/13 0735 Last data filed at 08/31/13 2200  Gross per 24 hour  Intake    510 ml  Output   1600 ml  Net  -1090 ml       General appearance: alert, cooperative and no distress Heart: regular rate and rhythm and S1, S2 normal Lungs: clear to auscultation bilaterally Abdomen: benign Extremities: minor edema right LE Wound: incisions healing well  Lab Results:  Recent Labs  08/31/13 0500 09/01/13 0600  NA 134* 136  K 4.1 4.0  CL 101 100  CO2 25 26  GLUCOSE 89 100*  BUN 26* 25*  CREATININE 1.42* 1.43*  CALCIUM 8.1* 8.7   No results found for this basename: AST, ALT, ALKPHOS, BILITOT, PROT, ALBUMIN,  in the last 72 hours No results found for this basename: LIPASE, AMYLASE,  in the last 72 hours  Recent Labs  08/31/13 0500 09/01/13 0600  WBC 6.9 6.3  HGB 9.0* 10.1*  HCT 25.4* 29.5*  MCV 96.2 96.4  PLT 89* 128*   No results found for this basename: CKTOTAL, CKMB, TROPONINI,  in the last 72 hours No components found with this basename: POCBNP,  No results found for this basename: DDIMER,  in the last 72 hours No results found for this basename: HGBA1C,  in the last 72 hours No results found for this basename: CHOL, HDL, LDLCALC, TRIG, CHOLHDL,  in the last 72  hours No results found for this basename: TSH, T4TOTAL, FREET3, T3FREE, THYROIDAB,  in the last 72 hours No results found for this basename: VITAMINB12, FOLATE, FERRITIN, TIBC, IRON, RETICCTPCT,  in the last 72 hours  Medications: Scheduled . acetaminophen  1,000 mg Oral Q6H   Or  . acetaminophen (TYLENOL) oral liquid 160 mg/5 mL  1,000 mg Per Tube Q6H  . aspirin EC  325 mg Oral Daily   Or  . aspirin  324 mg Per Tube Daily  . atorvastatin  40 mg Oral Daily  . bisacodyl  10 mg Oral Daily   Or  . bisacodyl  10 mg Rectal Daily  . docusate sodium  200 mg Oral Daily  . ferrous fumarate-b12-vitamic C-folic acid  1 capsule Oral TID PC  . furosemide  40 mg Oral Daily  . metoprolol succinate  25 mg Oral Daily  . pantoprazole  40 mg Oral Daily  . polyethylene glycol  17 g Oral Once  . potassium chloride  20 mEq Oral Daily  . sodium chloride  3 mL Intravenous Q12H  . tamsulosin  0.4 mg Oral Daily     Radiology/Studies:  Dg Chest 2 View  09/01/2013   *RADIOLOGY REPORT*  Clinical Data: Back pain, follow  up atelectasis  CHEST - 2 VIEW  Comparison: Prior radiograph from 08/30/2013  Findings: Median sternotomy wires of underlying CABG markers are unchanged.  Cardiomegaly is stable.  Current examination has been performed with a slightly improved degree of lung inflation with removed aeration of both lung bases. A left pleural effusion persists.  No acute osseous abnormality.  IMPRESSION:.  Improved bibasilar atelectasis.  Persistent small left pleural effusion.   Original Report Authenticated By: Rise Mu, M.D.    INR: Will add last result for INR, ABG once components are confirmed Will add last 4 CBG results once components are confirmed  Assessment/Plan: S/P Procedure(s) (LRB): CORONARY ARTERY BYPASS GRAFTING (CABG) times two using left internal mammary and right saphenous vein using endoscope. (N/A)  1 conts to do well 2 creat stable at 1.43, BP well controlled, would hold on  ACE inhibitor, gentle diuresis 3 H/H improved ABL anemia  4 push rehab/ pulm toilet- routine  LOS: 5 days    GOLD,WAYNE E 10/1/20147:35 AM  Patient seen and examined, agree with above Dc EPW Possibly home tomorrow if feeling better

## 2013-09-01 NOTE — Progress Notes (Signed)
EPW removed per order/protocol with ends intact. No bleeding or ectopy noted. Patient instructed to lay supine in bed x1hr. Verbalized understanding. Call bell and phone near. Will monitor.Mamie Levers

## 2013-09-01 NOTE — Progress Notes (Signed)
Ambulated 765ft in hallway using RW. Returned to bedside chair in room w/out incidence. Call bell and family near.[name]

## 2013-09-01 NOTE — Progress Notes (Signed)
CARDIAC REHAB PHASE I   PRE:  Rate/Rhythm: 93SR  BP:  Supine:   Sitting: 110/60  Standing:    SaO2: 98%RA  MODE:  Ambulation: 700 ft   POST:  Rate/Rhythm: 118ST  BP:  Supine:   Sitting: 120/60  Standing:    SaO2: 96%RA 0910-0930 Pt walked 700 ft with rolling walker and minimal asst. Probably does not need walker. Steady. Tolerated well. To recliner after walk. Pt quiet and reserved this am. Encouraged 2 more walks with staff.   Luetta Nutting, RN BSN  09/01/2013 9:27 AM

## 2013-09-02 DIAGNOSIS — I2510 Atherosclerotic heart disease of native coronary artery without angina pectoris: Secondary | ICD-10-CM | POA: Diagnosis not present

## 2013-09-02 MED ORDER — TRAMADOL HCL 50 MG PO TABS: 50.0000 mg | ORAL_TABLET | Freq: Four times a day (QID) | ORAL | Status: DC | PRN

## 2013-09-02 MED ORDER — FE FUMARATE-B12-VIT C-FA-IFC PO CAPS: 1.0000 | ORAL_CAPSULE | Freq: Every day | ORAL | Status: DC

## 2013-09-02 MED ORDER — METOPROLOL SUCCINATE ER 25 MG PO TB24: 25.0000 mg | ORAL_TABLET | Freq: Every day | ORAL | Status: DC

## 2013-09-02 MED ORDER — ASPIRIN 325 MG PO TBEC: 325.0000 mg | DELAYED_RELEASE_TABLET | Freq: Every day | ORAL | Status: DC

## 2013-09-02 NOTE — Progress Notes (Signed)
Pt ambulated 500 ft with the assistance of a rolling walker. Pt tolerated activity well and is now resting in bed. Will continue to monitor.

## 2013-09-02 NOTE — Progress Notes (Signed)
Chest tube sutures removed intact. Steri-strips applied per order/protocol. Patient tolerated well.[name]

## 2013-09-02 NOTE — Progress Notes (Signed)
PROGRESS NOTE  Subjective:   [name] is doing well after urgent CABG for critical LM disease.  He is now on 2 [place]  Objective:    Vital Signs:   Temp:  [98.6 F (37 C)-99.8 F (37.7 C)] 99.8 F (37.7 C) (10/02 0407) Pulse Rate:  [81-95] 95 (10/02 0407) Resp:  [18] 18 (10/02 0407) BP: (116-136)/(55-83) 135/73 mmHg (10/02 0407) SpO2:  [94 %-96 %] 94 % (10/02 0407) Weight:  [162 lb 7.7 oz (73.7 kg)] 162 lb 7.7 oz (73.7 kg) (10/02 0407)  Last BM Date: 08/30/13   24-hour weight change: Weight change: -8 lb 2.5 oz (-3.7 kg)  Weight trends: Filed Weights   08/31/13 0600 09/01/13 0500 09/02/13 0407  Weight: 169 lb 12.1 oz (77 kg) 170 lb 10.2 oz (77.4 kg) 162 lb 7.7 oz (73.7 kg)    Intake/Output:  10/01 0701 - 10/02 0700 In: 720 [P.O.:720] Out: 1325 [Urine:1325]     Physical Exam: BP 135/73  Pulse 95  Temp(Src) 99.8 F (37.7 C) (Oral)  Resp 18  Ht 5' 8.5" (1.74 m)  Wt 162 lb 7.7 oz (73.7 kg)  BMI 24.34 kg/m2  SpO2 94%  General: Vital signs reviewed and noted.   Head: Normocephalic, atraumatic.  Eyes: conjunctivae/corneas clear.  EOM's intact.   Throat: normal  Neck:  normal  Lungs:  clear anteriorly  Heart:  RR, normal S1, S2  Abdomen:  Soft, non-tender, non-distended    Extremities:  no edema  Neurologic: A&O X3, CN II - XII are grossly intact.   Psych: Normal , sleepy     Labs: BMET:  Recent Labs  08/31/13 0500 09/01/13 0600  NA 134* 136  K 4.1 4.0  CL 101 100  CO2 25 26  GLUCOSE 89 100*  BUN 26* 25*  CREATININE 1.42* 1.43*  CALCIUM 8.1* 8.7    Liver function tests: No results found for this basename: AST, ALT, ALKPHOS, BILITOT, PROT, ALBUMIN,  in the last 72 hours No results found for this basename: LIPASE, AMYLASE,  in the last 72 hours  CBC:  Recent Labs  08/31/13 0500 09/01/13 0600  WBC 6.9 6.3  HGB 9.0* 10.1*  HCT 25.4* 29.5*  MCV 96.2 96.4  PLT 89* 128*    Cardiac Enzymes: No results found for this basename:  CKTOTAL, CKMB, TROPONINI,  in the last 72 hours  Coagulation Studies: No results found for this basename: LABPROT, INR,  in the last 72 hours    Other results:  Tele:  NSR  Medications:    Infusions:    Scheduled Medications: . aspirin EC  325 mg Oral Daily   Or  . aspirin  324 mg Per Tube Daily  . atorvastatin  40 mg Oral Daily  . bisacodyl  10 mg Oral Daily   Or  . bisacodyl  10 mg Rectal Daily  . docusate sodium  200 mg Oral Daily  . ferrous fumarate-b12-vitamic C-folic acid  1 capsule Oral TID PC  . furosemide  40 mg Oral Daily  . metoprolol succinate  25 mg Oral Daily  . pantoprazole  40 mg Oral Daily  . polyethylene glycol  17 g Oral Once  . potassium chloride  20 mEq Oral Daily  . sodium chloride  3 mL Intravenous Q12H  . tamsulosin  0.4 mg Oral Daily    Assessment/ Plan:    1. CAD:  Doing well after CABG on Friday.   His anemia is better.  Agree  with plans to DC.he will see our NP or PA in several weeks and will see me in several months.      Vesta Mixer, Montez Hageman., MD, [facility] 09/02/2013, 9:16 AM Office [phone] Pager [phone]

## 2013-09-02 NOTE — Progress Notes (Signed)
[id number] Pt has rolling walker at home if needed for ambulation. Education completed with pt. He did not know when wife would be here. Encouraged pt to have wife read ex guidelines and heart healthy diet and for them to watch post op video. Encouraged IS. Pt states wife smokes in house on oxygen. Discussed with pt that he does not need to be around secondary smoke during recovery and dangerous for wife to smoke while wearing oxygen. Discussed with pt having wife smoke outside during recovery. Discussed CRP 2 and permission given to refer to [place] Phase 2. Luetta Nutting RNBSN

## 2013-09-02 NOTE — Progress Notes (Signed)
Reviewed discharge instructions with patient  & wife. Questions answered with teachback approp. DC home with belongings, instructions and prescriptions [name]

## 2013-09-02 NOTE — Care Management Note (Signed)
    Page 1 of 1   09/02/2013     3:24:39 PM   CARE MANAGEMENT NOTE 09/02/2013  Patient:  [name], [name]   Account Number:  [id number]  Date Initiated:  08/31/2013  Documentation initiated by:  MAYO,HENRIETTA  Subjective/Objective Assessment:   adm s/p CABG; lives with spouse     Action/Plan:   Anticipated DC Date:  09/02/2013   Anticipated DC Plan:  HOME/SELF CARE      DC Planning Services  CM consult      Choice offered to / List presented to:             Status of service:  Completed, signed off Medicare Important Message given?   (If response is "NO", the following Medicare IM given date fields will be blank) Date Medicare IM given:   Date Additional Medicare IM given:    Discharge Disposition:  HOME/SELF CARE  Per UR Regulation:  Reviewed for med. necessity/level of care/duration of stay  If discussed at Long Length of Stay Meetings, dates discussed:    Comments:  PCP  Dr Evelena Peat  09/02/13 JULIE AMERSON,RN,BSN [phone] PT FOR DC HOME TODAY.  WIFE TO PROVIDE 24HR CARE AT HOME AT DC.  PT HAS RW AT HOME, IF NEEDED.  DENIES NEED FOR HOME CARE OR OTHER DME.

## 2013-09-02 NOTE — Progress Notes (Addendum)
[address]       [place] [address]             [phone]      6 Days Post-Op  Procedure(s) (LRB): CORONARY ARTERY BYPASS GRAFTING (CABG) times two using left internal mammary and right saphenous vein using endoscope. (N/A) Subjective: conts to feel stronger  Objective  Telemetry sinus rhythm/sinus tachy  Temp:  [98.6 F (37 C)-99.8 F (37.7 C)] 99.8 F (37.7 C) (10/02 0407) Pulse Rate:  [81-95] 95 (10/02 0407) Resp:  [18] 18 (10/02 0407) BP: (116-136)/(55-83) 135/73 mmHg (10/02 0407) SpO2:  [94 %-96 %] 94 % (10/02 0407) Weight:  [162 lb 7.7 oz (73.7 kg)] 162 lb 7.7 oz (73.7 kg) (10/02 0407)   Intake/Output Summary (Last 24 hours) at 09/02/13 0758 Last data filed at 09/02/13 0626  Gross per 24 hour  Intake    240 ml  Output    925 ml  Net   -685 ml       General appearance: alert, cooperative and no distress Heart: regular rate and rhythm Lungs: mildly dim in bases Abdomen: benign Extremities: no edema Wound: incisions healing without signs of infection  Lab Results:  Recent Labs  08/31/13 0500 09/01/13 0600  NA 134* 136  K 4.1 4.0  CL 101 100  CO2 25 26  GLUCOSE 89 100*  BUN 26* 25*  CREATININE 1.42* 1.43*  CALCIUM 8.1* 8.7   No results found for this basename: AST, ALT, ALKPHOS, BILITOT, PROT, ALBUMIN,  in the last 72 hours No results found for this basename: LIPASE, AMYLASE,  in the last 72 hours  Recent Labs  08/31/13 0500 09/01/13 0600  WBC 6.9 6.3  HGB 9.0* 10.1*  HCT 25.4* 29.5*  MCV 96.2 96.4  PLT 89* 128*   No results found for this basename: CKTOTAL, CKMB, TROPONINI,  in the last 72 hours No components found with this basename: POCBNP,  No results found for this basename: DDIMER,  in the last 72 hours No results found for this basename: HGBA1C,  in the last 72 hours No results found for this basename: CHOL, HDL, LDLCALC, TRIG, CHOLHDL,  in the last 72 hours No results found for this basename: TSH,  T4TOTAL, FREET3, T3FREE, THYROIDAB,  in the last 72 hours No results found for this basename: VITAMINB12, FOLATE, FERRITIN, TIBC, IRON, RETICCTPCT,  in the last 72 hours  Medications: Scheduled . aspirin EC  325 mg Oral Daily   Or  . aspirin  324 mg Per Tube Daily  . atorvastatin  40 mg Oral Daily  . bisacodyl  10 mg Oral Daily   Or  . bisacodyl  10 mg Rectal Daily  . docusate sodium  200 mg Oral Daily  . ferrous fumarate-b12-vitamic C-folic acid  1 capsule Oral TID PC  . furosemide  40 mg Oral Daily  . metoprolol succinate  25 mg Oral Daily  . pantoprazole  40 mg Oral Daily  . polyethylene glycol  17 g Oral Once  . potassium chloride  20 mEq Oral Daily  . sodium chloride  3 mL Intravenous Q12H  . tamsulosin  0.4 mg Oral Daily     Radiology/Studies:  Dg Chest 2 View  09/01/2013   *RADIOLOGY REPORT*  Clinical Data: Back pain, follow up atelectasis  CHEST - 2 VIEW  Comparison: Prior radiograph from 08/30/2013  Findings: Median sternotomy wires of underlying CABG markers are unchanged.  Cardiomegaly is stable.  Current examination  has been performed with a slightly improved degree of lung inflation with removed aeration of both lung bases. A left pleural effusion persists.  No acute osseous abnormality.  IMPRESSION:.  Improved bibasilar atelectasis.  Persistent small left pleural effusion.   Original Report Authenticated By: Rise Mu, M.D.    INR: Will add last result for INR, ABG once components are confirmed Will add last 4 CBG results once components are confirmed  Assessment/Plan: S/P Procedure(s) (LRB): CORONARY ARTERY BYPASS GRAFTING (CABG) times two using left internal mammary and right saphenous vein using endoscope. (N/A) Plan for discharge: see discharge orders   LOS: 6 days    GOLD,WAYNE E 10/2/20147:58 AM  Patient seen and examined, agree with above

## 2013-09-06 ENCOUNTER — Telehealth: Payer: Self-pay | Admitting: Cardiovascular Disease

## 2013-09-06 NOTE — Telephone Encounter (Signed)
Pt wants to know why he was taken off colace. Pt is not on blood thinners so he was advised to call pcp, he agreed to plan.

## 2013-09-06 NOTE — Telephone Encounter (Signed)
New Problem:  Pt states he has been trying to have a bowel movement for the last 3 days. Pt states he finally had something this morning but it looked like blood. Pt states he only had that one episode this morning. Pt would like to be advised. Pt states he is taking mira-lax

## 2013-09-14 ENCOUNTER — Encounter (HOSPITAL_COMMUNITY): Payer: Self-pay | Admitting: Surgical

## 2013-09-14 NOTE — Discharge Summary (Signed)
[address]       [place] [address]             [phone]       [name] 02/13/1936 77 y.o. [id number]  08/27/2013 09/02/2013  No att. providers found  cp CAD   HPI:  This is a 77 y.o. male with a history of previous PTCA approximately 22 years ago. Approximately 3 weeks prior to hospitalization he began to develop symptoms of chest tightness and shortness of breath with exertion. The symptoms have been progressive. He was seen in cardiology consultation and felt to require cardiac catheterization which was done on 08/27/2013 revealing critical 95% left main stenosis. He was admitted for further evaluation treatment to include cardiothoracic surgical consultation.  Past Medical History   Diagnosis  Date   .  HYPERLIPIDEMIA  04/06/2010   .  BENIGN POSITIONAL VERTIGO  04/06/2010   .  HYPERTENSION  04/06/2010   .  CAD  04/06/2010   .  GERD  04/06/2010   .  SKIN LESION  04/06/2010   .  Acute sinusitis, unspecified  11/27/2010   .  Coronary artery disease  2006     angioplasty   .  Prostate cancer  09/06/05 dx     adenocarcinoma gleason=3+4=7,pSA=<0.01-0.13-212   .  Sleep apnea    .  Anal fissure    .  Allergy      bee sting,,amoxicillin, hydrocodone   .  Joint pain    .  OSTEOARTHRITIS, GENERALIZED  04/06/2010   .  History of radiation therapy  08/24/12-10/22/12     prostate 6600cGy/33 sessions   .  B12 deficiency     Past Surgical History   Procedure  Laterality  Date   .  Angioplasty   1991   .  Esophagas dilatation   1992   .  Hernia repair   1994     b/l groin area   .  Prostate surgery   11/18/05     prostatectomy, adenocarcinoma radical   .  Back surgery     .  Knee surgery       right arthoscopic    Family History   Problem  Relation  Age of Onset   .  Cancer  Father      prostate died 7 something   .  Arthritis  Other    .  Hyperlipidemia  Other    .  Hypertension  Other    .  Cancer  Brother      prostate cancer    Social  History: reports that he quit smoking about 18 years ago. His smoking use included Cigarettes. He has a 20 pack-year smoking history. He does not have any smokeless tobacco history on file. He reports that drinks alcohol. He reports that he does not use illicit drugs.  Allergies:  Allergies   Allergen  Reactions   .  Bee Venom    .  Hydrocodone      REACTION: disorientation   .  Amoxicillin  Rash    Medications:  Prior to Admission:  Prescriptions prior to admission   Medication  Sig  Dispense  Refill   .  aspirin EC 81 MG tablet  Take 81 mg by mouth daily.     [name]  atorvastatin (LIPITOR) 40 MG tablet  Take 40 mg by mouth daily.     [name]  docusate sodium (COLACE) 100 MG capsule  Take  200 mg by mouth at bedtime.     [name]  olmesartan (BENICAR) 20 MG tablet  Take 1 tablet (20 mg total) by mouth daily.  90 tablet  3   .  pantoprazole (PROTONIX) 40 MG tablet  Take 40 mg by mouth every other day.     .  polyethylene glycol (MIRALAX / GLYCOLAX) packet  Take 17 g by mouth daily as needed (for constipation).     .  Tamsulosin HCl (FLOMAX) 0.4 MG CAPS        Hospital Course:   The patient was seen in urgent cardiothoracic surgery consultation by Dr. Dorris Fetch who recommended proceeding to the operating room for coronary artery surgical revascularization. The following procedure was performed:  DATE OF PROCEDURE: 08/27/2013  DATE OF DISCHARGE:  OPERATIVE REPORT  PREOPERATIVE DIAGNOSIS: Critical left main disease with exertional  angina.  POSTOPERATIVE DIAGNOSIS: Critical left main disease with exertional  angina.  PROCEDURE: Median sternotomy, extracorporeal circulation, coronary  artery bypass grafting x2 (left internal mammary artery to left anterior  descending, saphenous vein graft to obtuse marginal), endoscopic vein  harvest right thigh.  SURGEON: Salvatore Decent. Dorris Fetch, M.D.  ASSISTANT: Lowella Dandy, PA-C.  ANESTHESIA: General.  FINDINGS: Good quality conduits, good quality targets.     Postoperative hospital course:  The patient has progressed nicely over time. He was weaned from the ventilator without difficulty using standard protocols. He did initially require some inotropic support and this was also weaned without difficulty. He has an acute blood loss anemia values have stabilized. He has been started on iron supplement. He had some moderate volume overload but responded well to diuretics. He had no significant cardiac dysrhythmias postoperatively. He tolerated gradually increasing activity using standard protocols. Oxygen was weaned without difficulty. He maintained good saturations on room air. He has some mild acute renal insufficiency which has stabilized but was not felt to tolerate a ACE inhibitor at this time. Incisions are noted to be healing well without evidence of infection. At time of discharge she was felt to be quite stable.     No results found for this basename: NA, K, CL, CO2, GLUCOSE, BUN, CRATININE, CALCIUM,  in the last 72 hours No results found for this basename: WBC, HGB, HCT, PLT,  in the last 72 hours No results found for this basename: INR,  in the last 72 hours   Discharge Instructions:  The patient is discharged to home with extensive instructions on wound care and progressive ambulation.  They are instructed not to drive or perform any heavy lifting until returning to see the physician in his office.  Discharge Diagnosis:  cp CAD CABGx 2 08/27/13  Secondary Diagnosis: Patient Active Problem List   Diagnosis Date Noted  . Vitamin B 12 deficiency 01/19/2013  . Abdominal aortic aneurysm 06/02/2012  . ACUTE SINUSITIS, UNSPECIFIED 11/27/2010  . HYPERLIPIDEMIA 04/06/2010  . BENIGN POSITIONAL VERTIGO 04/06/2010  . HYPERTENSION 04/06/2010  . CAD 04/06/2010  . GERD 04/06/2010  . SKIN LESION 04/06/2010  . OSTEOARTHRITIS, GENERALIZED 04/06/2010  . Prostate cancer 12/03/2004   Past Medical History  Diagnosis Date  . HYPERLIPIDEMIA  04/06/2010  . BENIGN POSITIONAL VERTIGO 04/06/2010  . HYPERTENSION 04/06/2010  . CAD 04/06/2010  . GERD 04/06/2010  . SKIN LESION 04/06/2010  . Acute sinusitis, unspecified 11/27/2010  . Coronary artery disease 2006    angioplasty  . Prostate cancer 09/06/05 dx    adenocarcinoma gleason=3+4=7,pSA=<0.01-0.13-212  . Sleep apnea   . Anal fissure   . Allergy  bee sting,,amoxicillin, hydrocodone  . Joint pain   . OSTEOARTHRITIS, GENERALIZED 04/06/2010  . History of radiation therapy 08/24/12-10/22/12    prostate 6600cGy/33 sessions  . B12 deficiency         Medication List    STOP taking these medications       docusate sodium 100 MG capsule  Commonly known as:  COLACE     olmesartan 20 MG tablet  Commonly known as:  BENICAR      TAKE these medications       aspirin 325 MG EC tablet  Take 1 tablet (325 mg total) by mouth daily.     atorvastatin 40 MG tablet  Commonly known as:  LIPITOR  Take 40 mg by mouth daily.     ferrous fumarate-b12-vitamic C-folic acid capsule  Commonly known as:  TRINSICON / FOLTRIN  Take 1 capsule by mouth daily before breakfast.     metoprolol succinate 25 MG 24 hr tablet  Commonly known as:  TOPROL-XL  Take 1 tablet (25 mg total) by mouth daily.     pantoprazole 40 MG tablet  Commonly known as:  PROTONIX  Take 40 mg by mouth every other day.     polyethylene glycol packet  Commonly known as:  MIRALAX / GLYCOLAX  Take 17 g by mouth daily as needed (for constipation).     tamsulosin 0.4 MG Caps capsule  Commonly known as:  FLOMAX  Take 0.4 mg by mouth daily.     traMADol 50 MG tablet  Commonly known as:  ULTRAM  Take 1 tablet (50 mg total) by mouth every 6 (six) hours as needed.       Follow-up Information   Follow up with Loreli Slot, MD. (3 weeks-the office will contact you his appointment. Please obtain a chest x-ray a [facility] 1 hour prior to the appointment with the surgeon. [facility] is located in the  same office complex.)    Specialty:  Cardiothoracic Surgery   Contact information:   [address] [place] [address] [phone]       Follow up with Peter Swaziland, MD. (2 weeks-please contact the office to arrange this appointment.)    Specialty:  Cardiology   Contact information:   [address] [place] [address] [phone]     The patient has been discharged on:   1.Beta Blocker:  Yes [  y ]                              No   [   ]                              If No, reason:  2.Ace Inhibitor/ARB: Yes [   ]                                     No  [  n  ]                                     If No, reason: Elevated creatinine postoperatively  3.Statin:   Yes Cove.Etienne   ]  No  [   ]                  If No, reason:  4.Ecasa:  Yes  Cove.Etienne   ]                  No   [   ]                  If No, reason:   Disposition: Discharged home  Patient's condition is Good  Gershon Crane, PA-C 09/14/2013  11:53 AM

## 2013-09-16 ENCOUNTER — Ambulatory Visit (INDEPENDENT_AMBULATORY_CARE_PROVIDER_SITE_OTHER): Payer: Medicare Other | Admitting: Physician Assistant

## 2013-09-16 ENCOUNTER — Encounter: Payer: Self-pay | Admitting: Physician Assistant

## 2013-09-16 VITALS — BP 130/72 | HR 77 | Ht 68.5 in | Wt 159.4 lb

## 2013-09-16 DIAGNOSIS — I2510 Atherosclerotic heart disease of native coronary artery without angina pectoris: Secondary | ICD-10-CM

## 2013-09-16 DIAGNOSIS — I10 Essential (primary) hypertension: Secondary | ICD-10-CM | POA: Diagnosis not present

## 2013-09-16 DIAGNOSIS — D649 Anemia, unspecified: Secondary | ICD-10-CM | POA: Diagnosis not present

## 2013-09-16 DIAGNOSIS — E785 Hyperlipidemia, unspecified: Secondary | ICD-10-CM | POA: Diagnosis not present

## 2013-09-16 DIAGNOSIS — D696 Thrombocytopenia, unspecified: Secondary | ICD-10-CM

## 2013-09-16 MED ORDER — METHOCARBAMOL 500 MG PO TABS: ORAL_TABLET | ORAL | Status: DC

## 2013-09-16 NOTE — Patient Instructions (Addendum)
You have been referred to CARDIAC REHAB TO BE DONE AT [facility]   LABS TODAY; BMET, CBC W/DIFF  ROBAXIN 500 MG 1-2 TABS EVERY 8 HOURS ONLY AS NEEDED  YOU HAVE A FOLLOW UP WITH DR. Elease Hashimoto IN 11/2013

## 2013-09-16 NOTE — Progress Notes (Signed)
[address] [place], [place]  [address] Phone: [phone] Fax:  [phone]  Date:  09/16/2013   ID:  [name], DOB 1936-03-21, MRN [id number]  PCP:  Kristian Covey, MD  Cardiologist:  Dr. Delane Ginger    History of Present Illness: [name] is a 77 y.o. male who returns for follow up after a recent admission to the hospital.   Has a history of CAD, status post angioplasty 1992, HTN, HL, prostate CA. He was recently evaluated for chest pain. Myoview was interpreted as low risk. He continued to have worsening symptoms. Cardiac catheterization was arranged.  LHC (9/14):  oLM 90-95%, pLAD 40-50%, pCFX 30-40%, oRCA 30%, EF 55-65%.  He was referred for CABG.  He underwent CABG with LIMA-LAD and SVG-OM on 08/27/13. Postoperative course was fairly uneventful. He remained in NSR.  Creatinine was mildly elevated postoperatively. No ACE inhibitor was started. He did have some thrombocytopenia which remained stable and improved prior to discharge.  Since discharge, he has continued to note interscapular back pain. This is slowly improved since discharge. He has been using Ultram. Chest is still sore but improving. Breathing is stable. He's been walking 15 minutes twice a day. He denies orthopnea, PND or edema. He denies syncope.  Labs (since 10/14):   K 4, creatinine 1.43, Hgb 10.1, PLT 128K  Wt Readings from Last 3 Encounters:  09/16/13 159 lb 6.4 oz (72.303 kg)  09/02/13 162 lb 7.7 oz (73.7 kg)  09/02/13 162 lb 7.7 oz (73.7 kg)     Past Medical History  Diagnosis Date  . HYPERLIPIDEMIA 04/06/2010  . BENIGN POSITIONAL VERTIGO 04/06/2010  . HYPERTENSION 04/06/2010  . CAD 04/06/2010  . GERD 04/06/2010  . SKIN LESION 04/06/2010  . Acute sinusitis, unspecified 11/27/2010  . Coronary artery disease 2006    angioplasty  . Prostate cancer 09/06/05 dx    adenocarcinoma gleason=3+4=7,pSA=<0.01-0.13-212  . Sleep apnea   . Anal fissure   . Allergy     bee sting,,amoxicillin,  hydrocodone  . Joint pain   . OSTEOARTHRITIS, GENERALIZED 04/06/2010  . History of radiation therapy 08/24/12-10/22/12    prostate 6600cGy/33 sessions  . B12 deficiency   . S/P CABG x 2     Current Outpatient Prescriptions  Medication Sig Dispense Refill  . aspirin EC 325 MG EC tablet Take 1 tablet (325 mg total) by mouth daily.      [name] atorvastatin (LIPITOR) 40 MG tablet Take 40 mg by mouth daily.      . metoprolol succinate (TOPROL-XL) 25 MG 24 hr tablet Take 1 tablet (25 mg total) by mouth daily.  60 tablet  1  . Multiple Vitamins-Minerals (CENTRUM SILVER PO) Take by mouth daily.      . pantoprazole (PROTONIX) 40 MG tablet Take 40 mg by mouth every other day.       . polyethylene glycol (MIRALAX / GLYCOLAX) packet Take 17 g by mouth daily as needed (for constipation).       . Tamsulosin HCl (FLOMAX) 0.4 MG CAPS Take 0.4 mg by mouth daily.       . traMADol (ULTRAM) 50 MG tablet Take 1 tablet (50 mg total) by mouth every 6 (six) hours as needed.  50 tablet  0   No current facility-administered medications for this visit.    Allergies:    Allergies  Allergen Reactions  . Bee Venom   . Hydrocodone     REACTION: disorientation  . Amoxicillin Rash  Social History:  The patient  reports that he quit smoking about 18 years ago. His smoking use included Cigarettes. He has a 20 pack-year smoking history. He does not have any smokeless tobacco history on file. He reports that he drinks alcohol. He reports that he does not use illicit drugs.   Family History:  The patient's family history includes Arthritis in his other; Cancer in his brother and father; Hyperlipidemia in his other; Hypertension in his other.   ROS:  Please see the history of present illness.      All other systems reviewed and negative.   PHYSICAL EXAM: VS:  BP 130/72  Pulse 77  Ht 5' 8.5" (1.74 m)  Wt 159 lb 6.4 oz (72.303 kg)  BMI 23.88 kg/m2  SpO2 97% Well nourished, well developed, in no acute distress HEENT:  normal Neck: no JVD Cardiac:  normal S1, S2; RRR; no murmur Chest: Median sternotomy scar well healed without erythema or discharge Lungs:  clear to auscultation bilaterally, no wheezing, rhonchi or rales Abd: soft, nontender, no hepatomegaly Ext: trace bilateral LE edema; SVG harvesting site on the right with mild to moderate hematoma Skin: warm and dry Neuro:  CNs 2-12 intact, no focal abnormalities noted  EKG:  NSR, HR 74, T wave inversions in 2, 3, aVF     ASSESSMENT AND PLAN:  1. CAD: Doing well after recent bypass surgery. Continue aspirin, statin, beta blocker. Followup with Dr. Dorris Fetch as planned. Refer to cardiac rehabilitation. 2. Hyperlipidemia: Continue statin. 3. Hypertension: Controlled.  Check a basic metabolic panel. 4. Back Pain: He has a muscular strain but was likely brought on by positioning during surgery. I have offered him a prescription for Robaxin 500 mg 1-2 tablets every 8 hours as needed, #30 no refills. He can also use heat. I reassured him that this should gradually improve. 5. Thrombocytopenia: Check a followup CBC. 6. Disposition: Followup with Dr. Elease Hashimoto as planned 11/16/13.  Signed, Tereso Newcomer, PA-C  09/16/2013 3:36 PM

## 2013-09-17 LAB — CBC WITH DIFFERENTIAL/PLATELET
Basophils Absolute: 0 10*3/uL (ref 0.0–0.1)
Basophils Relative: 0.6 % (ref 0.0–3.0)
Eosinophils Absolute: 0.2 10*3/uL (ref 0.0–0.7)
Eosinophils Relative: 3.4 % (ref 0.0–5.0)
HCT: 35.1 % — ABNORMAL LOW (ref 39.0–52.0)
Hemoglobin: 11.9 g/dL — ABNORMAL LOW (ref 13.0–17.0)
Lymphocytes Relative: 22.6 % (ref 12.0–46.0)
Lymphs Abs: 1.2 10*3/uL (ref 0.7–4.0)
MCHC: 33.8 g/dL (ref 30.0–36.0)
MCV: 99.9 fl (ref 78.0–100.0)
Monocytes Absolute: 0.7 10*3/uL (ref 0.1–1.0)
Monocytes Relative: 13.6 % — ABNORMAL HIGH (ref 3.0–12.0)
Neutro Abs: 3 10*3/uL (ref 1.4–7.7)
Neutrophils Relative %: 59.8 % (ref 43.0–77.0)
Platelets: 315 10*3/uL (ref 150.0–400.0)
RBC: 3.51 Mil/uL — ABNORMAL LOW (ref 4.22–5.81)
RDW: 15.3 % — ABNORMAL HIGH (ref 11.5–14.6)
WBC: 5.1 10*3/uL (ref 4.5–10.5)

## 2013-09-17 LAB — BASIC METABOLIC PANEL
BUN: 26 mg/dL — ABNORMAL HIGH (ref 6–23)
CO2: 30 mEq/L (ref 19–32)
Calcium: 9.4 mg/dL (ref 8.4–10.5)
Chloride: 100 mEq/L (ref 96–112)
Creatinine, Ser: 1.5 mg/dL (ref 0.4–1.5)
GFR: 46.96 mL/min — ABNORMAL LOW (ref >60.00)
Glucose, Bld: 112 mg/dL — ABNORMAL HIGH (ref 70–99)
Potassium: 4.7 mEq/L (ref 3.5–5.1)
Sodium: 137 mEq/L (ref 135–145)

## 2013-09-24 ENCOUNTER — Other Ambulatory Visit: Payer: Self-pay | Admitting: *Deleted

## 2013-09-24 DIAGNOSIS — I2510 Atherosclerotic heart disease of native coronary artery without angina pectoris: Secondary | ICD-10-CM

## 2013-09-28 ENCOUNTER — Encounter: Payer: Self-pay | Admitting: Thoracic Surgery (Cardiothoracic Vascular Surgery)

## 2013-09-28 ENCOUNTER — Ambulatory Visit (INDEPENDENT_AMBULATORY_CARE_PROVIDER_SITE_OTHER): Payer: Self-pay | Admitting: Thoracic Surgery (Cardiothoracic Vascular Surgery)

## 2013-09-28 ENCOUNTER — Ambulatory Visit
Admission: RE | Admit: 2013-09-28 | Discharge: 2013-09-28 | Disposition: A | Discharge status: Home / Self Care | Payer: Medicare Other | Source: Ambulatory Visit | Attending: Thoracic Surgery (Cardiothoracic Vascular Surgery) | Admitting: Thoracic Surgery (Cardiothoracic Vascular Surgery)

## 2013-09-28 VITALS — BP 122/72 | HR 80 | Resp 16 | Ht 68.5 in | Wt 155.0 lb

## 2013-09-28 DIAGNOSIS — I2510 Atherosclerotic heart disease of native coronary artery without angina pectoris: Secondary | ICD-10-CM

## 2013-09-28 DIAGNOSIS — J90 Pleural effusion, not elsewhere classified: Secondary | ICD-10-CM | POA: Diagnosis not present

## 2013-09-28 DIAGNOSIS — Z951 Presence of aortocoronary bypass graft: Secondary | ICD-10-CM

## 2013-09-28 NOTE — Progress Notes (Signed)
HPI:  [name] turns today for a scheduled postoperative followup visit. He was found to have severe left main disease and underwent coronary bypass grafting x2 on September 26. His postoperative course was relatively uncomplicated and he was discharged on October 2.  Since then he's been doing well. He does have some incisional discomfort and is still taking Ultram about once a day. He's not had any anginal-type chest pain. His exercise tolerance is improving. His primary complaint is nocturia, which he believes is related to Flomax.  Past Medical History  Diagnosis Date  . HYPERLIPIDEMIA 04/06/2010  . BENIGN POSITIONAL VERTIGO 04/06/2010  . HYPERTENSION 04/06/2010  . CAD 04/06/2010  . GERD 04/06/2010  . SKIN LESION 04/06/2010  . Acute sinusitis, unspecified 11/27/2010  . Coronary artery disease 2006    angioplasty  . Prostate cancer 09/06/05 dx    adenocarcinoma gleason=3+4=7,pSA=<0.01-0.13-212  . Sleep apnea   . Anal fissure   . Allergy     bee sting,,amoxicillin, hydrocodone  . Joint pain   . OSTEOARTHRITIS, GENERALIZED 04/06/2010  . History of radiation therapy 08/24/12-10/22/12    prostate 6600cGy/33 sessions  . B12 deficiency   . S/P CABG x 2       Current Outpatient Prescriptions  Medication Sig Dispense Refill  . aspirin EC 325 MG EC tablet Take 1 tablet (325 mg total) by mouth daily.      [name] atorvastatin (LIPITOR) 40 MG tablet Take 40 mg by mouth daily.      . methocarbamol (ROBAXIN) 500 MG tablet TAKE 1-2 TABS EVERY 8 HOURS AS NEEDED  30 tablet  0  . metoprolol succinate (TOPROL-XL) 25 MG 24 hr tablet Take 1 tablet (25 mg total) by mouth daily.  60 tablet  1  . Multiple Vitamins-Minerals (CENTRUM SILVER PO) Take by mouth daily.      . pantoprazole (PROTONIX) 40 MG tablet Take 40 mg by mouth every other day.       . polyethylene glycol (MIRALAX / GLYCOLAX) packet Take 17 g by mouth daily as needed (for constipation).       . Tamsulosin HCl (FLOMAX) 0.4 MG CAPS Take 0.4 mg by  mouth daily.       . traMADol (ULTRAM) 50 MG tablet Take 1 tablet (50 mg total) by mouth every 6 (six) hours as needed.  50 tablet  0   No current facility-administered medications for this visit.    Physical Exam BP 122/72  Pulse 80  Resp 16  Ht 5' 8.5" (1.74 m)  Wt 155 lb (70.308 kg)  BMI 23.22 kg/m2  SpO68 25% 77 year old gentleman in no acute distress Well-developed well-nourished Neuro no focal deficits Cardiac regular rate and rhythm normal S1 and S2 no rubs, murmurs, or gallops Lungs clear with equal breath sounds bilaterally, no rales or wheezes Sternum stable, incision clean dry and intact Leg incisions healing well, no peripheral edema  Diagnostic Tests: Chest x-ray 09/28/2013 EXAM:  CHEST 2 VIEW  COMPARISON: 09/01/2013  FINDINGS:  The lungs are well-expanded. There is mild flattening of the left  hemidiaphragm with blunting of the left lateral costophrenic angle.  The patient is status post CABG. The cardiac silhouette is normal in  size. The pulmonary vascularity is not engorged. The mediastinum is  normal in width. The observed portions of the bony thorax are  normal.  IMPRESSION:  There is mild hyperinflation without evidence of pneumonia or CHF.  There has been resolution of the right pleural effusion and  improvement in the  left pleural effusion since the previous study.  Electronically Signed  By: David Swaziland  On: 09/28/2013 09:24  Impression: 77 year old gentleman who is now about a month out from coronary bypass grafting x2 for critical left main disease. Overall I think he is doing well. His exercise tolerance is improving. He is not having much incisional pain.  His primary complaint is nocturia. He thinks this may be due to Flomax. I doubt that Flomax is the source of this problem, but did advise him to take it when he 1st wakes up rather than later in the day.  From a surgical standpoint he is doing well. He should not lift anything over 10 pounds  for another 2 weeks. He may begin driving, appropriate precautions were discussed.  Plan: He'll continue to follow with Dr. Caryl Never and Dr. Elease Hashimoto.  I will be happy to see him back at any time if I can be of any further assistance with his care

## 2013-09-28 NOTE — Patient Instructions (Signed)
You may begin driving with precautions as discussed  Do not lift anything over 10 pounds for another 2 weeks

## 2013-10-07 ENCOUNTER — Encounter (HOSPITAL_COMMUNITY)
Admission: RE | Admit: 2013-10-07 | Discharge: 2013-10-07 | Disposition: A | Discharge status: Home / Self Care | Payer: Medicare Other | Source: Ambulatory Visit | Attending: Cardiovascular Disease | Admitting: Cardiovascular Disease

## 2013-10-07 DIAGNOSIS — I2510 Atherosclerotic heart disease of native coronary artery without angina pectoris: Secondary | ICD-10-CM | POA: Insufficient documentation

## 2013-10-07 DIAGNOSIS — Z951 Presence of aortocoronary bypass graft: Secondary | ICD-10-CM | POA: Insufficient documentation

## 2013-10-07 DIAGNOSIS — Z5189 Encounter for other specified aftercare: Secondary | ICD-10-CM | POA: Insufficient documentation

## 2013-10-07 DIAGNOSIS — Z9861 Coronary angioplasty status: Secondary | ICD-10-CM | POA: Insufficient documentation

## 2013-10-07 NOTE — Progress Notes (Signed)
Cardiac Rehab Medication Review by a Pharmacist  Does the patient  feel that his/her medications are working for him/her?  yes  Has the patient been experiencing any side effects to the medications prescribed?  no  Does the patient measure his/her own blood pressure or blood glucose at home?  no   Does the patient have any problems obtaining medications due to transportation or finances?   no  Understanding of regimen: fair Understanding of indications: fair Potential of compliance: fair    Pharmacist comments:  67 YOM who presents with little enthusiasm today.Patient denies any side effects or issues obtaining medications, though it was difficult to ascertain based on his brief and reluctant responses. When asked if patient had a list of medications that he keep, patient denied, though he did show me one later on in our conversation. His potential for compliance is unclear. Reinforcement would be beneficial.  Thanks for allowing me to take part in the care of this patient,   Britt Bottom B. Artelia Laroche, PharmD Clinical Pharmacist - Resident Pager: [phone] Phone: [phone] 10/07/2013 8:45 AM

## 2013-10-08 ENCOUNTER — Other Ambulatory Visit: Payer: Self-pay | Admitting: Cardiovascular Disease

## 2013-10-08 ENCOUNTER — Other Ambulatory Visit: Payer: Self-pay | Admitting: Family Medicine

## 2013-10-11 ENCOUNTER — Encounter (HOSPITAL_COMMUNITY): Payer: Self-pay

## 2013-10-11 ENCOUNTER — Encounter (HOSPITAL_COMMUNITY)
Admission: RE | Admit: 2013-10-11 | Discharge: 2013-10-11 | Disposition: A | Discharge status: Home / Self Care | Payer: Medicare Other | Source: Ambulatory Visit | Attending: Cardiovascular Disease | Admitting: Cardiovascular Disease

## 2013-10-11 DIAGNOSIS — I2510 Atherosclerotic heart disease of native coronary artery without angina pectoris: Secondary | ICD-10-CM | POA: Diagnosis not present

## 2013-10-11 DIAGNOSIS — Z951 Presence of aortocoronary bypass graft: Secondary | ICD-10-CM | POA: Diagnosis not present

## 2013-10-11 DIAGNOSIS — Z5189 Encounter for other specified aftercare: Secondary | ICD-10-CM | POA: Diagnosis not present

## 2013-10-11 DIAGNOSIS — Z9861 Coronary angioplasty status: Secondary | ICD-10-CM | POA: Diagnosis not present

## 2013-10-11 NOTE — Progress Notes (Signed)
Pt has no psychosocial barriers to rehab participation.  PHQ-0. 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 Pt also has prostate cancer, with prostectomy 6 years ago and radiation therapy one year ago.  Overall pt has hopeful outlook and exhibits positive coping skills.  Pt did not return his written homework including self reflected quality of life scores.   Will continue to monitor

## 2013-10-11 NOTE — Progress Notes (Signed)
Pt in today for his first day of exercise at the 11:15 cardiac rehab phase II program.  Pt tolerated light exercise with no complaints or distress.  Monitor showed sr with no noted ectopy. Continue to monitor.

## 2013-10-12 ENCOUNTER — Encounter: Payer: Self-pay | Admitting: *Deleted

## 2013-10-12 DIAGNOSIS — Z951 Presence of aortocoronary bypass graft: Secondary | ICD-10-CM | POA: Insufficient documentation

## 2013-10-13 ENCOUNTER — Encounter (HOSPITAL_COMMUNITY)
Admission: RE | Admit: 2013-10-13 | Discharge: 2013-10-13 | Disposition: A | Discharge status: Home / Self Care | Payer: Medicare Other | Source: Ambulatory Visit | Attending: Cardiovascular Disease | Admitting: Cardiovascular Disease

## 2013-10-13 DIAGNOSIS — I2510 Atherosclerotic heart disease of native coronary artery without angina pectoris: Secondary | ICD-10-CM | POA: Diagnosis not present

## 2013-10-13 DIAGNOSIS — Z9861 Coronary angioplasty status: Secondary | ICD-10-CM | POA: Diagnosis not present

## 2013-10-13 DIAGNOSIS — Z951 Presence of aortocoronary bypass graft: Secondary | ICD-10-CM | POA: Diagnosis not present

## 2013-10-13 DIAGNOSIS — Z5189 Encounter for other specified aftercare: Secondary | ICD-10-CM | POA: Diagnosis not present

## 2013-10-15 ENCOUNTER — Encounter (HOSPITAL_COMMUNITY)
Admission: RE | Admit: 2013-10-15 | Discharge: 2013-10-15 | Disposition: A | Discharge status: Home / Self Care | Payer: Medicare Other | Source: Ambulatory Visit | Attending: Cardiovascular Disease | Admitting: Cardiovascular Disease

## 2013-10-15 DIAGNOSIS — Z5189 Encounter for other specified aftercare: Secondary | ICD-10-CM | POA: Diagnosis not present

## 2013-10-15 DIAGNOSIS — I2510 Atherosclerotic heart disease of native coronary artery without angina pectoris: Secondary | ICD-10-CM | POA: Diagnosis not present

## 2013-10-15 DIAGNOSIS — Z951 Presence of aortocoronary bypass graft: Secondary | ICD-10-CM | POA: Diagnosis not present

## 2013-10-15 DIAGNOSIS — Z9861 Coronary angioplasty status: Secondary | ICD-10-CM | POA: Diagnosis not present

## 2013-10-18 ENCOUNTER — Encounter (HOSPITAL_COMMUNITY)
Admission: RE | Admit: 2013-10-18 | Discharge: 2013-10-18 | Disposition: A | Discharge status: Home / Self Care | Payer: Medicare Other | Source: Ambulatory Visit | Attending: Cardiovascular Disease | Admitting: Cardiovascular Disease

## 2013-10-18 DIAGNOSIS — I2510 Atherosclerotic heart disease of native coronary artery without angina pectoris: Secondary | ICD-10-CM | POA: Diagnosis not present

## 2013-10-18 DIAGNOSIS — Z951 Presence of aortocoronary bypass graft: Secondary | ICD-10-CM | POA: Diagnosis not present

## 2013-10-18 DIAGNOSIS — Z5189 Encounter for other specified aftercare: Secondary | ICD-10-CM | POA: Diagnosis not present

## 2013-10-18 DIAGNOSIS — Z9861 Coronary angioplasty status: Secondary | ICD-10-CM | POA: Diagnosis not present

## 2013-10-18 NOTE — Progress Notes (Signed)
Reviewed home exercise with pt today.  Pt plans to walk and use treadmill at home for exercise.  Reviewed THR, pulse, RPE, sign and symptoms, and when to call 911 or MD.  Pt voiced understanding. Jessica Hawkins, MA, ACSM RCEP  

## 2013-10-20 ENCOUNTER — Encounter (HOSPITAL_COMMUNITY)
Admission: RE | Admit: 2013-10-20 | Discharge: 2013-10-20 | Disposition: A | Discharge status: Home / Self Care | Payer: Medicare Other | Source: Ambulatory Visit | Attending: Cardiovascular Disease | Admitting: Cardiovascular Disease

## 2013-10-20 DIAGNOSIS — Z5189 Encounter for other specified aftercare: Secondary | ICD-10-CM | POA: Diagnosis not present

## 2013-10-20 DIAGNOSIS — Z9861 Coronary angioplasty status: Secondary | ICD-10-CM | POA: Diagnosis not present

## 2013-10-20 DIAGNOSIS — Z951 Presence of aortocoronary bypass graft: Secondary | ICD-10-CM | POA: Diagnosis not present

## 2013-10-20 DIAGNOSIS — I2510 Atherosclerotic heart disease of native coronary artery without angina pectoris: Secondary | ICD-10-CM | POA: Diagnosis not present

## 2013-10-22 ENCOUNTER — Encounter (HOSPITAL_COMMUNITY)
Admission: RE | Admit: 2013-10-22 | Discharge: 2013-10-22 | Disposition: A | Discharge status: Home / Self Care | Payer: Medicare Other | Source: Ambulatory Visit | Attending: Cardiovascular Disease | Admitting: Cardiovascular Disease

## 2013-10-22 DIAGNOSIS — Z9861 Coronary angioplasty status: Secondary | ICD-10-CM | POA: Diagnosis not present

## 2013-10-22 DIAGNOSIS — Z951 Presence of aortocoronary bypass graft: Secondary | ICD-10-CM | POA: Diagnosis not present

## 2013-10-22 DIAGNOSIS — Z5189 Encounter for other specified aftercare: Secondary | ICD-10-CM | POA: Diagnosis not present

## 2013-10-22 DIAGNOSIS — I2510 Atherosclerotic heart disease of native coronary artery without angina pectoris: Secondary | ICD-10-CM | POA: Diagnosis not present

## 2013-10-25 ENCOUNTER — Encounter (HOSPITAL_COMMUNITY)
Admission: RE | Admit: 2013-10-25 | Discharge: 2013-10-25 | Disposition: A | Discharge status: Home / Self Care | Payer: Medicare Other | Source: Ambulatory Visit | Attending: Cardiovascular Disease | Admitting: Cardiovascular Disease

## 2013-10-25 DIAGNOSIS — Z9861 Coronary angioplasty status: Secondary | ICD-10-CM | POA: Diagnosis not present

## 2013-10-25 DIAGNOSIS — I2510 Atherosclerotic heart disease of native coronary artery without angina pectoris: Secondary | ICD-10-CM | POA: Diagnosis not present

## 2013-10-25 DIAGNOSIS — Z5189 Encounter for other specified aftercare: Secondary | ICD-10-CM | POA: Diagnosis not present

## 2013-10-25 DIAGNOSIS — Z951 Presence of aortocoronary bypass graft: Secondary | ICD-10-CM | POA: Diagnosis not present

## 2013-10-27 ENCOUNTER — Encounter (HOSPITAL_COMMUNITY)
Admission: RE | Admit: 2013-10-27 | Discharge: 2013-10-27 | Disposition: A | Discharge status: Home / Self Care | Payer: Medicare Other | Source: Ambulatory Visit | Attending: Cardiovascular Disease | Admitting: Cardiovascular Disease

## 2013-10-27 DIAGNOSIS — Z951 Presence of aortocoronary bypass graft: Secondary | ICD-10-CM | POA: Diagnosis not present

## 2013-10-27 DIAGNOSIS — Z9861 Coronary angioplasty status: Secondary | ICD-10-CM | POA: Diagnosis not present

## 2013-10-27 DIAGNOSIS — Z5189 Encounter for other specified aftercare: Secondary | ICD-10-CM | POA: Diagnosis not present

## 2013-10-27 DIAGNOSIS — I2510 Atherosclerotic heart disease of native coronary artery without angina pectoris: Secondary | ICD-10-CM | POA: Diagnosis not present

## 2013-10-29 ENCOUNTER — Encounter (HOSPITAL_COMMUNITY): Payer: Medicare Other

## 2013-11-01 ENCOUNTER — Encounter (HOSPITAL_COMMUNITY)
Admission: RE | Admit: 2013-11-01 | Discharge: 2013-11-01 | Disposition: A | Discharge status: Home / Self Care | Payer: Medicare Other | Source: Ambulatory Visit | Attending: Cardiovascular Disease | Admitting: Cardiovascular Disease

## 2013-11-01 DIAGNOSIS — Z951 Presence of aortocoronary bypass graft: Secondary | ICD-10-CM | POA: Diagnosis not present

## 2013-11-01 DIAGNOSIS — Z9861 Coronary angioplasty status: Secondary | ICD-10-CM | POA: Insufficient documentation

## 2013-11-01 DIAGNOSIS — I2510 Atherosclerotic heart disease of native coronary artery without angina pectoris: Secondary | ICD-10-CM | POA: Insufficient documentation

## 2013-11-01 DIAGNOSIS — Z5189 Encounter for other specified aftercare: Secondary | ICD-10-CM | POA: Diagnosis not present

## 2013-11-03 ENCOUNTER — Encounter (HOSPITAL_COMMUNITY)
Admission: RE | Admit: 2013-11-03 | Discharge: 2013-11-03 | Disposition: A | Discharge status: Home / Self Care | Payer: Medicare Other | Source: Ambulatory Visit | Attending: Cardiovascular Disease | Admitting: Cardiovascular Disease

## 2013-11-05 ENCOUNTER — Encounter (HOSPITAL_COMMUNITY)
Admission: RE | Admit: 2013-11-05 | Discharge: 2013-11-05 | Disposition: A | Discharge status: Home / Self Care | Payer: Medicare Other | Source: Ambulatory Visit | Attending: Cardiovascular Disease | Admitting: Cardiovascular Disease

## 2013-11-08 ENCOUNTER — Encounter (HOSPITAL_COMMUNITY)
Admission: RE | Admit: 2013-11-08 | Discharge: 2013-11-08 | Disposition: A | Discharge status: Home / Self Care | Payer: Medicare Other | Source: Ambulatory Visit | Attending: Cardiovascular Disease | Admitting: Cardiovascular Disease

## 2013-11-08 ENCOUNTER — Ambulatory Visit: Payer: Medicare Other | Admitting: Cardiovascular Disease

## 2013-11-08 ENCOUNTER — Telehealth: Payer: Self-pay | Admitting: *Deleted

## 2013-11-08 ENCOUNTER — Other Ambulatory Visit: Payer: Self-pay | Admitting: *Deleted

## 2013-11-08 MED ORDER — NITROGLYCERIN 0.4 MG SL SUBL: 0.4000 mg | SUBLINGUAL_TABLET | SUBLINGUAL | Status: DC | PRN

## 2013-11-08 NOTE — Telephone Encounter (Signed)
Patient requests rx for nitro. I dont see that he has ever had it. Is this ok to order for him? Please advise. Thanks, MI

## 2013-11-08 NOTE — Telephone Encounter (Signed)
Yes,  Has has been on NTG.  It's probably been a long time - before epic.  Please give him NTG 0.4 mg SL, # 25, PRN refills

## 2013-11-08 NOTE — Telephone Encounter (Signed)
Refill sent in. Patients wife aware.

## 2013-11-10 ENCOUNTER — Encounter (HOSPITAL_COMMUNITY)
Admission: RE | Admit: 2013-11-10 | Discharge: 2013-11-10 | Disposition: A | Discharge status: Home / Self Care | Payer: Medicare Other | Source: Ambulatory Visit | Attending: Cardiovascular Disease | Admitting: Cardiovascular Disease

## 2013-11-10 NOTE — Progress Notes (Signed)
[name] 77 y.o. male Nutrition Note Spoke with pt.  Nutrition Plan and Nutrition Survey goals reviewed with pt. Pt is following Step 2 of the Therapeutic Lifestyle Changes diet. Pt expressed understanding of the information reviewed. Pt aware of nutrition education classes offered and plans on attending nutrition classes.  Nutrition Diagnosis   Food-and nutrition-related knowledge deficit related to lack of exposure to information as related to diagnosis of: ? CVD   Nutrition RX/ Estimated Daily Nutrition Needs for: wt maintenance 1950-2250 Kcal, 65-75 gm fat, 13-15 gm sat fat, 1.9-2.3 gm trans-fat, <1500 mg sodium   Nutrition Intervention   Pt's individual nutrition plan including cholesterol goals reviewed with pt.   Benefits of adopting Therapeutic Lifestyle Changes discussed when Medficts reviewed.   Pt to attend the Portion Distortion class   Pt to attend the  ? Nutrition I class                      ? Nutrition II class - met 10/12/13   Continue client-centered nutrition education by RD, as part of interdisciplinary care.  Goal(s)   Pt to describe the benefit of including fruits, vegetables, whole grains, and low-fat dairy products in a heart healthy meal plan.  Monitor and Evaluate progress toward nutrition goal with team. Nutrition Risk:  Low   [name], M.Ed, RD, LDN, CDE 11/10/2013 12:24 PM

## 2013-11-12 ENCOUNTER — Encounter (HOSPITAL_COMMUNITY)
Admission: RE | Admit: 2013-11-12 | Discharge: 2013-11-12 | Disposition: A | Discharge status: Home / Self Care | Payer: Medicare Other | Source: Ambulatory Visit | Attending: Cardiovascular Disease | Admitting: Cardiovascular Disease

## 2013-11-15 ENCOUNTER — Encounter (HOSPITAL_COMMUNITY)
Admission: RE | Admit: 2013-11-15 | Discharge: 2013-11-15 | Disposition: A | Discharge status: Home / Self Care | Payer: Medicare Other | Source: Ambulatory Visit | Attending: Cardiovascular Disease | Admitting: Cardiovascular Disease

## 2013-11-16 ENCOUNTER — Ambulatory Visit: Payer: Medicare Other | Admitting: Cardiovascular Disease

## 2013-11-16 ENCOUNTER — Encounter: Payer: Self-pay | Admitting: Cardiovascular Disease

## 2013-11-16 ENCOUNTER — Ambulatory Visit (INDEPENDENT_AMBULATORY_CARE_PROVIDER_SITE_OTHER): Payer: Medicare Other | Admitting: Cardiovascular Disease

## 2013-11-16 VITALS — BP 126/80 | HR 64 | Ht 67.0 in | Wt 157.1 lb

## 2013-11-16 DIAGNOSIS — I10 Essential (primary) hypertension: Secondary | ICD-10-CM | POA: Diagnosis not present

## 2013-11-16 DIAGNOSIS — I2510 Atherosclerotic heart disease of native coronary artery without angina pectoris: Secondary | ICD-10-CM

## 2013-11-16 DIAGNOSIS — E785 Hyperlipidemia, unspecified: Secondary | ICD-10-CM

## 2013-11-16 NOTE — Patient Instructions (Signed)
Your physician wants you to follow-up in: 6 months  You will receive a reminder letter in the mail two months in advance. If you don't receive a letter, please call our office to schedule the follow-up appointment.  Your physician recommends that you return for a FASTING lipid profile: 6 months   

## 2013-11-16 NOTE — Progress Notes (Signed)
Dorris Fetch Date of Birth  01-05-1936 [facility]     [place] [facility]  [address]    [address]   [address] [place], [place]  [address]    [place], [place]  [address] [phone]  Fax  [phone]  [phone]  Fax [phone]  Problem list: 1. Coronary artery disease- status post remote PTCA to the left complex artery in 1991 2. Hypertension 3. Hyperlipidemia 4. Prostate Cancer.  History of Present Illness:  Guss is  a 77 year old gentleman with a history of PTCA approximately 22 years ago.  He's not had any episodes of chest pain or shortness of breath. He stays fairly active although he doesn't exercise at the gym.  He has some problems with some arthritis-type pain in his right hip.  The Lipitor causes a little bit of muscular skeletal pain.  May 10, 2013:  He stopped hib Benicar 2 months ago. His BP has been running low at home.  He has been on a new medicine for his prostate and was concerned that his BP would run too low.   He has been taking a varied dose of benicar - 1 tab, 1/2 tab, 1/4 tablet whenever he thinks he needs it.   Sept. 5, 2014:  Brach presents today for worsening shortness of breath and chest pain.  Last Saturday, he mowed the lawn and spread 200 lbs of lime.  He developed severe CP and started to take some NTG.  The pain finally eased up. Several days later, he had recurrent CP while cleaning out the gutters.    He thinks he was just overheated.   He has been recording his BP and has been getting some low readings.     He has been having some hip pain and does not know if he will be able to walk on the treadmill.   Sept. 24, 2014: Costantino was seen recently with some worsening chest pain. He had a stress Myoview study which was interpreted as low risk he has a fixed inferoapical defect.  He exertion, he is still having angina.  Lasts 5 minutes. In the center of his chest. No radiatioin.  Occurs only with exertion ( stating  mower or Rototiller- never walking around the house.)   Class II - III angina.    Yesterday, he had some mild baseline CP which worsened while walking around the grocery store.   Dec. 16, 2014:  Jaree has had CABG since I last saw him in the office.  Still has chest tenderness - breathing is good, no CP  Chest wall is still tender.    Current Outpatient Prescriptions on File Prior to Visit  Medication Sig Dispense Refill  . aspirin EC 325 MG EC tablet Take 1 tablet (325 mg total) by mouth daily.      [name] atorvastatin (LIPITOR) 40 MG tablet TAKE 1 TABLET DAILY  90 tablet  0  . methocarbamol (ROBAXIN) 500 MG tablet TAKE 1-2 TABS EVERY 8 HOURS AS NEEDED  30 tablet  0  . metoprolol succinate (TOPROL-XL) 25 MG 24 hr tablet Take 1 tablet (25 mg total) by mouth daily.  60 tablet  1  . Multiple Vitamins-Minerals (CENTRUM SILVER PO) Take by mouth daily.      . nitroGLYCERIN (NITROSTAT) 0.4 MG SL tablet Place 1 tablet (0.4 mg total) under the tongue every 5 (five) minutes as needed for chest pain.  25 tablet  PRN  . pantoprazole (PROTONIX) 40 MG tablet  TAKE 1 TABLET DAILY  90 tablet  1  . polyethylene glycol (MIRALAX / GLYCOLAX) packet Take 17 g by mouth daily as needed (for constipation).       . Tamsulosin HCl (FLOMAX) 0.4 MG CAPS Take 0.4 mg by mouth daily.       . traMADol (ULTRAM) 50 MG tablet Take 1 tablet (50 mg total) by mouth every 6 (six) hours as needed.  50 tablet  0   No current facility-administered medications on file prior to visit.    Allergies  Allergen Reactions  . Bee Venom Other (See Comments)    Reaction unknown  . Amoxicillin Rash  . Hydrocodone Other (See Comments)    REACTION: disorientation    Past Medical History  Diagnosis Date  . HYPERLIPIDEMIA 04/06/2010  . BENIGN POSITIONAL VERTIGO 04/06/2010  . HYPERTENSION 04/06/2010  . CAD 04/06/2010  . GERD 04/06/2010  . SKIN LESION 04/06/2010  . Acute sinusitis, unspecified 11/27/2010  . Coronary artery disease 2006     angioplasty  . Prostate cancer 09/06/05 dx    adenocarcinoma gleason=3+4=7,pSA=<0.01-0.13-212  . Sleep apnea   . Anal fissure   . Allergy     bee sting,,amoxicillin, hydrocodone  . Joint pain   . OSTEOARTHRITIS, GENERALIZED 04/06/2010  . History of radiation therapy 08/24/12-10/22/12    prostate 6600cGy/33 sessions  . B12 deficiency   . S/P CABG x 2     Past Surgical History  Procedure Laterality Date  . Angioplasty  1991  . Esophagas dilatation  1992  . Hernia repair  1994    b/l groin area  . Prostate surgery  11/18/05    prostatectomy, adenocarcinoma radical  . Back surgery    . Knee surgery      right arthoscopic  . Coronary artery bypass graft  08-27-13    CABGx2  . Coronary artery bypass graft N/A 08/27/2013    Procedure: CORONARY ARTERY BYPASS GRAFTING (CABG) times two using left internal mammary and right saphenous vein using endoscope.;  Surgeon: Loreli Slot, MD;  Location: [facility] OR;  Service: Open Heart Surgery;  Laterality: N/A;    History  Smoking status  . Former Smoker -- 2.00 packs/day for 10 years  . Types: Cigarettes  . Quit date: 12/03/1994  Smokeless tobacco  . Not on file    History  Alcohol Use  . Yes    Comment: occasionally    Family History  Problem Relation Age of Onset  . Cancer Father     prostate died 32 something  . Arthritis Other   . Hyperlipidemia Other   . Hypertension Other   . Cancer Brother     prostate cancer    Reviw of Systems:  Reviewed in the HPI.  All other systems are negative.  Physical Exam: Blood pressure 126/80, pulse 64, height 5\' 7"  (1.702 m), weight 157 lb 1.9 oz (71.269 kg). General: Well developed, well nourished, in no acute distress.  Head: Normocephalic, atraumatic, sclera non-icteric, mucus membranes are moist,   Neck: Supple. Negative for carotid bruits. JVD not elevated.  Lungs: Clear bilaterally to auscultation without wheezes, rales, or rhonchi. Breathing is unlabored.  Heart: RRR with  S1 S2. No murmurs, rubs, or gallops appreciated.  Abdomen: Soft, non-tender, non-distended with normoactive bowel sounds. No hepatomegaly. No rebound/guarding. No obvious abdominal masses.  Msk:  Strength and tone appear normal for age.  Extremities: No clubbing or cyanosis. No edema.  Distal pedal pulses are 2+ and equal bilaterally.  Neuro: Alert  and oriented X 3. Moves all extremities spontaneously.  Psych:  Responds to questions appropriately with a normal affect.  ECG: Sept. 5, 2014:  Sinus brady at 28.  LAE, no ST or T wave abnormalities  Assessment / Plan:

## 2013-11-16 NOTE — Assessment & Plan Note (Signed)
BP is stable.

## 2013-11-16 NOTE — Assessment & Plan Note (Signed)
[name] is doing well. He's having the usual amount of chest soreness. He's not had any episodes of chest pain or shortness breath. His blood pressure is well-controlled.  We will continue his same medications. I'll see him again in 4-6 months. Will check fasting labs at that time.

## 2013-11-16 NOTE — Assessment & Plan Note (Signed)
We'll check fasting labs at her to his next office visit. Continue atorvastatin for now.

## 2013-11-17 ENCOUNTER — Encounter (HOSPITAL_COMMUNITY)
Admission: RE | Admit: 2013-11-17 | Discharge: 2013-11-17 | Disposition: A | Discharge status: Home / Self Care | Payer: Medicare Other | Source: Ambulatory Visit | Attending: Cardiovascular Disease | Admitting: Cardiovascular Disease

## 2013-11-19 ENCOUNTER — Encounter (HOSPITAL_COMMUNITY)
Admission: RE | Admit: 2013-11-19 | Discharge: 2013-11-19 | Disposition: A | Discharge status: Home / Self Care | Payer: Medicare Other | Source: Ambulatory Visit | Attending: Cardiovascular Disease | Admitting: Cardiovascular Disease

## 2013-11-22 ENCOUNTER — Encounter (HOSPITAL_COMMUNITY)
Admission: RE | Admit: 2013-11-22 | Discharge: 2013-11-22 | Disposition: A | Discharge status: Home / Self Care | Payer: Medicare Other | Source: Ambulatory Visit | Attending: Cardiovascular Disease | Admitting: Cardiovascular Disease

## 2013-11-23 DIAGNOSIS — C61 Malignant neoplasm of prostate: Secondary | ICD-10-CM | POA: Diagnosis not present

## 2013-11-24 ENCOUNTER — Encounter (HOSPITAL_COMMUNITY)
Admission: RE | Admit: 2013-11-24 | Discharge: 2013-11-24 | Disposition: A | Discharge status: Home / Self Care | Payer: Medicare Other | Source: Ambulatory Visit | Attending: Cardiovascular Disease | Admitting: Cardiovascular Disease

## 2013-11-26 ENCOUNTER — Encounter (HOSPITAL_COMMUNITY): Payer: Medicare Other

## 2013-11-29 ENCOUNTER — Encounter (HOSPITAL_COMMUNITY)
Admission: RE | Admit: 2013-11-29 | Discharge: 2013-11-29 | Disposition: A | Discharge status: Home / Self Care | Payer: Medicare Other | Source: Ambulatory Visit | Attending: Cardiovascular Disease | Admitting: Cardiovascular Disease

## 2013-11-30 DIAGNOSIS — R39198 Other difficulties with micturition: Secondary | ICD-10-CM | POA: Diagnosis not present

## 2013-11-30 DIAGNOSIS — R351 Nocturia: Secondary | ICD-10-CM | POA: Diagnosis not present

## 2013-11-30 DIAGNOSIS — Z8546 Personal history of malignant neoplasm of prostate: Secondary | ICD-10-CM | POA: Diagnosis not present

## 2013-11-30 DIAGNOSIS — R300 Dysuria: Secondary | ICD-10-CM | POA: Diagnosis not present

## 2013-12-01 ENCOUNTER — Encounter (HOSPITAL_COMMUNITY)
Admission: RE | Admit: 2013-12-01 | Discharge: 2013-12-01 | Disposition: A | Discharge status: Home / Self Care | Payer: Medicare Other | Source: Ambulatory Visit | Attending: Cardiovascular Disease | Admitting: Cardiovascular Disease

## 2013-12-03 ENCOUNTER — Encounter (HOSPITAL_COMMUNITY)
Admission: RE | Admit: 2013-12-03 | Discharge: 2013-12-03 | Disposition: A | Discharge status: Home / Self Care | Payer: Medicare Other | Source: Ambulatory Visit | Attending: Cardiovascular Disease | Admitting: Cardiovascular Disease

## 2013-12-03 DIAGNOSIS — I2510 Atherosclerotic heart disease of native coronary artery without angina pectoris: Secondary | ICD-10-CM | POA: Insufficient documentation

## 2013-12-03 DIAGNOSIS — Z9861 Coronary angioplasty status: Secondary | ICD-10-CM | POA: Insufficient documentation

## 2013-12-03 DIAGNOSIS — Z5189 Encounter for other specified aftercare: Secondary | ICD-10-CM | POA: Insufficient documentation

## 2013-12-03 DIAGNOSIS — Z951 Presence of aortocoronary bypass graft: Secondary | ICD-10-CM | POA: Diagnosis not present

## 2013-12-06 ENCOUNTER — Encounter (HOSPITAL_COMMUNITY)
Admission: RE | Admit: 2013-12-06 | Discharge: 2013-12-06 | Disposition: A | Discharge status: Home / Self Care | Payer: Medicare Other | Source: Ambulatory Visit | Attending: Cardiovascular Disease | Admitting: Cardiovascular Disease

## 2013-12-08 ENCOUNTER — Encounter (HOSPITAL_COMMUNITY)
Admission: RE | Admit: 2013-12-08 | Discharge: 2013-12-08 | Disposition: A | Discharge status: Home / Self Care | Payer: Medicare Other | Source: Ambulatory Visit | Attending: Cardiovascular Disease | Admitting: Cardiovascular Disease

## 2013-12-10 ENCOUNTER — Encounter (HOSPITAL_COMMUNITY)
Admission: RE | Admit: 2013-12-10 | Discharge: 2013-12-10 | Disposition: A | Discharge status: Home / Self Care | Payer: Medicare Other | Source: Ambulatory Visit | Attending: Cardiovascular Disease | Admitting: Cardiovascular Disease

## 2013-12-13 ENCOUNTER — Encounter (HOSPITAL_COMMUNITY)
Admission: RE | Admit: 2013-12-13 | Discharge: 2013-12-13 | Disposition: A | Discharge status: Home / Self Care | Payer: Medicare Other | Source: Ambulatory Visit | Attending: Cardiovascular Disease | Admitting: Cardiovascular Disease

## 2013-12-15 ENCOUNTER — Encounter (HOSPITAL_COMMUNITY)
Admission: RE | Admit: 2013-12-15 | Discharge: 2013-12-15 | Disposition: A | Discharge status: Home / Self Care | Payer: Medicare Other | Source: Ambulatory Visit | Attending: Cardiovascular Disease | Admitting: Cardiovascular Disease

## 2013-12-17 ENCOUNTER — Encounter (HOSPITAL_COMMUNITY)
Admission: RE | Admit: 2013-12-17 | Discharge: 2013-12-17 | Disposition: A | Discharge status: Home / Self Care | Payer: Medicare Other | Source: Ambulatory Visit | Attending: Cardiovascular Disease | Admitting: Cardiovascular Disease

## 2013-12-20 ENCOUNTER — Ambulatory Visit: Payer: Medicare Other | Admitting: Family Medicine

## 2013-12-20 ENCOUNTER — Encounter (HOSPITAL_COMMUNITY)
Admission: RE | Admit: 2013-12-20 | Discharge: 2013-12-20 | Disposition: A | Discharge status: Home / Self Care | Payer: Medicare Other | Source: Ambulatory Visit | Attending: Cardiovascular Disease | Admitting: Cardiovascular Disease

## 2013-12-21 ENCOUNTER — Ambulatory Visit: Payer: Medicare Other | Admitting: Family Medicine

## 2013-12-22 ENCOUNTER — Encounter (HOSPITAL_COMMUNITY)
Admission: RE | Admit: 2013-12-22 | Discharge: 2013-12-22 | Disposition: A | Discharge status: Home / Self Care | Payer: Medicare Other | Source: Ambulatory Visit | Attending: Cardiovascular Disease | Admitting: Cardiovascular Disease

## 2013-12-24 ENCOUNTER — Encounter (HOSPITAL_COMMUNITY)
Admission: RE | Admit: 2013-12-24 | Discharge: 2013-12-24 | Disposition: A | Discharge status: Home / Self Care | Payer: Medicare Other | Source: Ambulatory Visit | Attending: Cardiovascular Disease | Admitting: Cardiovascular Disease

## 2013-12-27 ENCOUNTER — Encounter (HOSPITAL_COMMUNITY)
Admission: RE | Admit: 2013-12-27 | Discharge: 2013-12-27 | Disposition: A | Discharge status: Home / Self Care | Payer: Medicare Other | Source: Ambulatory Visit | Attending: Cardiovascular Disease | Admitting: Cardiovascular Disease

## 2013-12-29 ENCOUNTER — Other Ambulatory Visit: Payer: Self-pay | Admitting: *Deleted

## 2013-12-29 ENCOUNTER — Encounter (HOSPITAL_COMMUNITY)
Admission: RE | Admit: 2013-12-29 | Discharge: 2013-12-29 | Disposition: A | Discharge status: Home / Self Care | Payer: Medicare Other | Source: Ambulatory Visit | Attending: Cardiovascular Disease | Admitting: Cardiovascular Disease

## 2013-12-29 MED ORDER — METOPROLOL SUCCINATE ER 25 MG PO TB24: 25.0000 mg | ORAL_TABLET | Freq: Every day | ORAL | Status: DC

## 2013-12-31 ENCOUNTER — Encounter (HOSPITAL_COMMUNITY)
Admission: RE | Admit: 2013-12-31 | Discharge: 2013-12-31 | Disposition: A | Discharge status: Home / Self Care | Payer: Medicare Other | Source: Ambulatory Visit | Attending: Cardiovascular Disease | Admitting: Cardiovascular Disease

## 2014-01-03 ENCOUNTER — Encounter (HOSPITAL_COMMUNITY)
Admission: RE | Admit: 2014-01-03 | Discharge: 2014-01-03 | Disposition: A | Discharge status: Home / Self Care | Payer: Medicare Other | Source: Ambulatory Visit | Attending: Cardiovascular Disease | Admitting: Cardiovascular Disease

## 2014-01-03 DIAGNOSIS — Z9861 Coronary angioplasty status: Secondary | ICD-10-CM | POA: Diagnosis not present

## 2014-01-03 DIAGNOSIS — Z5189 Encounter for other specified aftercare: Secondary | ICD-10-CM | POA: Insufficient documentation

## 2014-01-03 DIAGNOSIS — Z951 Presence of aortocoronary bypass graft: Secondary | ICD-10-CM | POA: Diagnosis not present

## 2014-01-03 DIAGNOSIS — I2510 Atherosclerotic heart disease of native coronary artery without angina pectoris: Secondary | ICD-10-CM | POA: Insufficient documentation

## 2014-01-05 ENCOUNTER — Encounter (HOSPITAL_COMMUNITY)
Admission: RE | Admit: 2014-01-05 | Discharge: 2014-01-05 | Disposition: A | Discharge status: Home / Self Care | Payer: Medicare Other | Source: Ambulatory Visit | Attending: Cardiovascular Disease | Admitting: Cardiovascular Disease

## 2014-01-05 NOTE — Progress Notes (Signed)
Pt graduated with the completion of  36 exercise sessions in the phase II program.  Pt has made excellent progress and has remained focus and constant in his efforts during exercise.  Medication list reconciled with the exception of one medication he did not recall that was switched from flomax.  Pt plans to continue home exercise with use of treadmill that he has at home. Based upon his dedication and excellent attendance I feel he will do well in this endeavor.  Repeat PHQ2 0.

## 2014-01-06 DIAGNOSIS — R39198 Other difficulties with micturition: Secondary | ICD-10-CM | POA: Diagnosis not present

## 2014-01-06 DIAGNOSIS — N35919 Unspecified urethral stricture, male, unspecified site: Secondary | ICD-10-CM | POA: Diagnosis not present

## 2014-01-06 DIAGNOSIS — R300 Dysuria: Secondary | ICD-10-CM | POA: Diagnosis not present

## 2014-01-06 DIAGNOSIS — R351 Nocturia: Secondary | ICD-10-CM | POA: Diagnosis not present

## 2014-01-07 ENCOUNTER — Encounter (HOSPITAL_COMMUNITY): Payer: Medicare Other

## 2014-01-10 ENCOUNTER — Other Ambulatory Visit: Payer: Self-pay | Admitting: *Deleted

## 2014-01-10 ENCOUNTER — Telehealth: Payer: Self-pay | Admitting: Cardiovascular Disease

## 2014-01-10 ENCOUNTER — Encounter (HOSPITAL_COMMUNITY): Payer: Medicare Other

## 2014-01-10 MED ORDER — ATORVASTATIN CALCIUM 40 MG PO TABS: ORAL_TABLET | ORAL | Status: DC

## 2014-01-10 NOTE — Telephone Encounter (Signed)
Mr. Hilleary is at low risk for his upcoming surgery.   I would like for him to continue his aspirin 81 mg if possible.

## 2014-01-10 NOTE — Progress Notes (Signed)
RX REFILLED

## 2014-01-10 NOTE — Telephone Encounter (Signed)
Instructions were copied and taken to be faxed.

## 2014-01-10 NOTE — Telephone Encounter (Signed)
I will forward to Dr Acie Fredrickson to review.

## 2014-01-10 NOTE — Telephone Encounter (Signed)
New message    Need surgical clearance.  pls fax to [phone].  Clearance was faxed last week.

## 2014-01-12 ENCOUNTER — Other Ambulatory Visit: Payer: Self-pay

## 2014-01-12 ENCOUNTER — Encounter (HOSPITAL_COMMUNITY): Payer: Medicare Other

## 2014-01-12 MED ORDER — ATORVASTATIN CALCIUM 40 MG PO TABS: ORAL_TABLET | ORAL | Status: DC

## 2014-01-14 ENCOUNTER — Encounter (HOSPITAL_COMMUNITY): Payer: Medicare Other

## 2014-01-14 ENCOUNTER — Telehealth: Payer: Self-pay | Admitting: Cardiovascular Disease

## 2014-01-14 NOTE — Telephone Encounter (Signed)
Advised patient will resend to [facility]

## 2014-01-14 NOTE — Telephone Encounter (Signed)
New message     Pt have not received clearance from [place] for [facility] to do a procedure

## 2014-01-17 ENCOUNTER — Telehealth: Payer: Self-pay | Admitting: *Deleted

## 2014-01-17 NOTE — Telephone Encounter (Signed)
Pt is at low risk for cardiac complications for urology surgery

## 2014-01-17 NOTE — Telephone Encounter (Signed)
Message copied by Jonathon Jordan on Mon Jan 17, 2014 12:10 PM ------      Message from: Gemma Payor D      Created: Thu Jan 13, 2014  4:12 PM       Babette Relic calling For Clearance on pt For Dr.Woodruff/[facility]      Please call her at [phone] x [phone]            Thanks, Maudie Mercury  ------

## 2014-01-17 NOTE — Telephone Encounter (Signed)
Left msg to call back and give fax number to send to.

## 2014-01-17 NOTE — Telephone Encounter (Signed)
I will forward to Dr Acie Fredrickson to advise.

## 2014-01-19 ENCOUNTER — Other Ambulatory Visit: Payer: Self-pay | Admitting: Urology

## 2014-01-20 ENCOUNTER — Encounter (HOSPITAL_COMMUNITY): Payer: Self-pay | Admitting: Pharmacy Technician

## 2014-01-24 ENCOUNTER — Ambulatory Visit (HOSPITAL_COMMUNITY)
Admission: RE | Admit: 2014-01-24 | Discharge: 2014-01-24 | Disposition: A | Discharge status: Home / Self Care | Payer: Medicare Other | Source: Ambulatory Visit | Attending: Urology | Admitting: Urology

## 2014-01-24 ENCOUNTER — Encounter (HOSPITAL_COMMUNITY)
Admission: RE | Admit: 2014-01-24 | Discharge: 2014-01-24 | Disposition: A | Discharge status: Home / Self Care | Payer: Medicare Other | Source: Ambulatory Visit | Attending: Urology | Admitting: Urology

## 2014-01-24 ENCOUNTER — Encounter (HOSPITAL_COMMUNITY): Payer: Self-pay

## 2014-01-24 DIAGNOSIS — Z01812 Encounter for preprocedural laboratory examination: Secondary | ICD-10-CM | POA: Insufficient documentation

## 2014-01-24 DIAGNOSIS — Z01818 Encounter for other preprocedural examination: Secondary | ICD-10-CM | POA: Insufficient documentation

## 2014-01-24 DIAGNOSIS — I2510 Atherosclerotic heart disease of native coronary artery without angina pectoris: Secondary | ICD-10-CM | POA: Diagnosis not present

## 2014-01-24 DIAGNOSIS — N2889 Other specified disorders of kidney and ureter: Secondary | ICD-10-CM | POA: Diagnosis not present

## 2014-01-24 HISTORY — DX: Personal history of other diseases of the digestive system: Z87.19

## 2014-01-24 LAB — CBC
HCT: 40 % (ref 39.0–52.0)
Hemoglobin: 13.8 g/dL (ref 13.0–17.0)
MCH: 34.5 pg — ABNORMAL HIGH (ref 26.0–34.0)
MCHC: 34.5 g/dL (ref 30.0–36.0)
MCV: 100 fL (ref 78.0–100.0)
Platelets: 136 10*3/uL — ABNORMAL LOW (ref 150–400)
RBC: 4 MIL/uL — ABNORMAL LOW (ref 4.22–5.81)
RDW: 13.2 % (ref 11.5–15.5)
WBC: 5.6 10*3/uL (ref 4.0–10.5)

## 2014-01-24 LAB — BASIC METABOLIC PANEL
BUN: 20 mg/dL (ref 6–23)
CO2: 25 mEq/L (ref 19–32)
Calcium: 9.5 mg/dL (ref 8.4–10.5)
Chloride: 103 mEq/L (ref 96–112)
Creatinine, Ser: 1.45 mg/dL — ABNORMAL HIGH (ref 0.50–1.35)
GFR calc Af Amer: 53 mL/min — ABNORMAL LOW (ref >90)
GFR calc non Af Amer: 46 mL/min — ABNORMAL LOW (ref >90)
Glucose, Bld: 86 mg/dL (ref 70–99)
Potassium: 5.1 mEq/L (ref 3.7–5.3)
Sodium: 138 mEq/L (ref 137–147)

## 2014-01-24 NOTE — Pre-Procedure Instructions (Signed)
EKG REPORT 09/16/13 IN EPIC. CXR WAS DONE TODAY - PREOP - AT [facility]. CARDIOLOGY OFFICE NOTES IN EPIC DR. Acie Fredrickson FROM 11/16/13.

## 2014-01-24 NOTE — Patient Instructions (Signed)
   YOUR SURGERY IS SCHEDULED AT [facility]  ON:  Wednesday  2/25  REPORT TO  SHORT STAY CENTER AT:  12:15 PM      PHONE # FOR SHORT STAY IS [phone]  DO NOT EAT  ANYTHING AFTER MIDNIGHT THE NIGHT BEFORE YOUR SURGERY.  YOU MAY BRUSH YOUR TEETH.    NO FOOD, NO CHEWING GUM, NO MINTS, NO CANDIES, NO CHEWING TOBACCO. YOU MAY HAVE CLEAR LIQUIDS TO DRINK FROM MIDNIGHT UNTIL 8:15 AM DAY OF SURGERY  -  LIKE WATER, BLACK COFFEE.   NOTHING TO DRINK AFTER 8:15 AM DAY OF SURGERY.  PLEASE TAKE THE FOLLOWING MEDICATIONS THE AM OF YOUR SURGERY WITH A FEW SIPS OF WATER:  METOPROLOL, PANTOPRAZOLE.   DO NOT BRING VALUABLES, MONEY, CREDIT CARDS.  DO NOT WEAR JEWELRY, MAKE-UP, NAIL POLISH AND NO METAL PINS OR CLIPS IN YOUR HAIR. CONTACT LENS, DENTURES / PARTIALS, GLASSES SHOULD NOT BE WORN TO SURGERY AND IN MOST CASES-HEARING AIDS WILL NEED TO BE REMOVED.  BRING YOUR GLASSES CASE, ANY EQUIPMENT NEEDED FOR YOUR CONTACT LENS. FOR PATIENTS ADMITTED TO THE HOSPITAL--CHECK OUT TIME THE DAY OF DISCHARGE IS 11:00 AM.  ALL INPATIENT ROOMS ARE PRIVATE - WITH BATHROOM, TELEPHONE, TELEVISION AND WIFI INTERNET.  IF YOU ARE BEING DISCHARGED THE SAME DAY OF YOUR SURGERY--YOU CAN NOT DRIVE YOURSELF HOME--AND SHOULD NOT GO HOME ALONE BY TAXI OR BUS.  NO DRIVING OR OPERATING MACHINERY, OR MAKING LEGAL DECISIONS FOR 24 HOURS FOLLOWING ANESTHESIA / PAIN MEDICATIONS.  PLEASE MAKE ARRANGEMENTS FOR SOMEONE TO BE WITH YOU AT HOME THE FIRST 24 HOURS AFTER SURGERY. RESPONSIBLE DRIVER'S NAME / PHONE    [place];  PT'S WIFE WILL BE AT HOME                                                    FAILURE TO FOLLOW THESE INSTRUCTIONS MAY RESULT IN THE CANCELLATION OF YOUR SURGERY. PLEASE BE AWARE THAT YOU MAY NEED ADDITIONAL BLOOD DRAWN DAY OF YOUR SURGERY  PATIENT SIGNATURE_________________________________

## 2014-01-24 NOTE — Pre-Procedure Instructions (Signed)
PT'S PREOP BMET REPORT FAXED TO DR. Delton Coombes OFFICE - CREATININE 1.45

## 2014-01-26 ENCOUNTER — Encounter (HOSPITAL_COMMUNITY)
Admission: RE | Disposition: A | Discharge status: Home / Self Care | Payer: Self-pay | Source: Ambulatory Visit | Attending: Urology

## 2014-01-26 ENCOUNTER — Encounter (HOSPITAL_COMMUNITY): Payer: Medicare Other | Admitting: Anesthesiology

## 2014-01-26 ENCOUNTER — Encounter (HOSPITAL_COMMUNITY): Payer: Self-pay | Admitting: *Deleted

## 2014-01-26 ENCOUNTER — Ambulatory Visit (HOSPITAL_COMMUNITY): Payer: Medicare Other | Admitting: Anesthesiology

## 2014-01-26 ENCOUNTER — Ambulatory Visit (HOSPITAL_COMMUNITY)
Admission: RE | Admit: 2014-01-26 | Discharge: 2014-01-26 | Disposition: A | Discharge status: Home / Self Care | Payer: Medicare Other | Source: Ambulatory Visit | Attending: Urology | Admitting: Urology

## 2014-01-26 DIAGNOSIS — N3020 Other chronic cystitis without hematuria: Secondary | ICD-10-CM | POA: Diagnosis not present

## 2014-01-26 DIAGNOSIS — N329 Bladder disorder, unspecified: Secondary | ICD-10-CM | POA: Insufficient documentation

## 2014-01-26 DIAGNOSIS — G4730 Sleep apnea, unspecified: Secondary | ICD-10-CM | POA: Diagnosis not present

## 2014-01-26 DIAGNOSIS — Z923 Personal history of irradiation: Secondary | ICD-10-CM | POA: Diagnosis not present

## 2014-01-26 DIAGNOSIS — N35919 Unspecified urethral stricture, male, unspecified site: Secondary | ICD-10-CM | POA: Diagnosis not present

## 2014-01-26 DIAGNOSIS — I10 Essential (primary) hypertension: Secondary | ICD-10-CM | POA: Diagnosis not present

## 2014-01-26 DIAGNOSIS — Z87891 Personal history of nicotine dependence: Secondary | ICD-10-CM | POA: Insufficient documentation

## 2014-01-26 DIAGNOSIS — Z951 Presence of aortocoronary bypass graft: Secondary | ICD-10-CM | POA: Insufficient documentation

## 2014-01-26 DIAGNOSIS — Z9079 Acquired absence of other genital organ(s): Secondary | ICD-10-CM | POA: Insufficient documentation

## 2014-01-26 DIAGNOSIS — K219 Gastro-esophageal reflux disease without esophagitis: Secondary | ICD-10-CM | POA: Insufficient documentation

## 2014-01-26 DIAGNOSIS — Z8546 Personal history of malignant neoplasm of prostate: Secondary | ICD-10-CM | POA: Diagnosis not present

## 2014-01-26 DIAGNOSIS — I2510 Atherosclerotic heart disease of native coronary artery without angina pectoris: Secondary | ICD-10-CM | POA: Insufficient documentation

## 2014-01-26 DIAGNOSIS — E785 Hyperlipidemia, unspecified: Secondary | ICD-10-CM | POA: Insufficient documentation

## 2014-01-26 DIAGNOSIS — M159 Polyosteoarthritis, unspecified: Secondary | ICD-10-CM | POA: Insufficient documentation

## 2014-01-26 HISTORY — PX: CYSTOSCOPY WITH URETHRAL DILATATION: SHX5125

## 2014-01-26 SURGERY — CYSTOSCOPY, WITH URETHRAL DILATION
Anesthesia: General

## 2014-01-26 MED ORDER — ONDANSETRON HCL 4 MG/2ML IJ SOLN
INTRAMUSCULAR | Status: DC | PRN
  Administered 2014-01-26: 4 mg via INTRAVENOUS

## 2014-01-26 MED ORDER — LACTATED RINGERS IV SOLN: INTRAVENOUS | Status: DC

## 2014-01-26 MED ORDER — PROPOFOL 10 MG/ML IV BOLUS
INTRAVENOUS | Status: AC
  Filled 2014-01-26: qty 20

## 2014-01-26 MED ORDER — ONDANSETRON HCL 4 MG/2ML IJ SOLN
INTRAMUSCULAR | Status: AC
  Filled 2014-01-26: qty 2

## 2014-01-26 MED ORDER — FENTANYL CITRATE 0.05 MG/ML IJ SOLN
INTRAMUSCULAR | Status: AC
  Filled 2014-01-26: qty 5

## 2014-01-26 MED ORDER — MIDAZOLAM HCL 2 MG/2ML IJ SOLN
INTRAMUSCULAR | Status: AC
  Filled 2014-01-26: qty 2

## 2014-01-26 MED ORDER — FENTANYL CITRATE 0.05 MG/ML IJ SOLN
INTRAMUSCULAR | Status: DC | PRN
  Administered 2014-01-26 (×2): 25 ug via INTRAVENOUS

## 2014-01-26 MED ORDER — 0.9 % SODIUM CHLORIDE (POUR BTL) OPTIME
TOPICAL | Status: DC | PRN
  Administered 2014-01-26: 1000 mL

## 2014-01-26 MED ORDER — CISATRACURIUM BESYLATE 20 MG/10ML IV SOLN
INTRAVENOUS | Status: AC
  Filled 2014-01-26: qty 10

## 2014-01-26 MED ORDER — DEXAMETHASONE SODIUM PHOSPHATE 10 MG/ML IJ SOLN
INTRAMUSCULAR | Status: DC | PRN
  Administered 2014-01-26: 10 mg via INTRAVENOUS

## 2014-01-26 MED ORDER — DEXAMETHASONE SODIUM PHOSPHATE 10 MG/ML IJ SOLN
INTRAMUSCULAR | Status: AC
  Filled 2014-01-26: qty 1

## 2014-01-26 MED ORDER — PHENAZOPYRIDINE HCL 100 MG PO TABS: 100.0000 mg | ORAL_TABLET | Freq: Three times a day (TID) | ORAL | Status: DC | PRN

## 2014-01-26 MED ORDER — LIDOCAINE HCL 2 % EX GEL
CUTANEOUS | Status: AC
  Filled 2014-01-26: qty 10

## 2014-01-26 MED ORDER — SODIUM CHLORIDE 0.9 % IJ SOLN
INTRAMUSCULAR | Status: AC
  Filled 2014-01-26: qty 10

## 2014-01-26 MED ORDER — HYOSCYAMINE SULFATE 0.125 MG PO TABS: 0.1250 mg | ORAL_TABLET | ORAL | Status: DC | PRN

## 2014-01-26 MED ORDER — CIPROFLOXACIN IN D5W 400 MG/200ML IV SOLN
INTRAVENOUS | Status: AC
  Filled 2014-01-26: qty 200

## 2014-01-26 MED ORDER — SENNOSIDES-DOCUSATE SODIUM 8.6-50 MG PO TABS
1.0000 | ORAL_TABLET | Freq: Two times a day (BID) | ORAL | Status: DC
Start: 2014-01-26 — End: 2014-05-25

## 2014-01-26 MED ORDER — CIPROFLOXACIN HCL 500 MG PO TABS: 500.0000 mg | ORAL_TABLET | Freq: Two times a day (BID) | ORAL | Status: DC

## 2014-01-26 MED ORDER — OXYCODONE-ACETAMINOPHEN 5-325 MG PO TABS: 1.0000 | ORAL_TABLET | ORAL | Status: DC | PRN

## 2014-01-26 MED ORDER — FENTANYL CITRATE 0.05 MG/ML IJ SOLN: 25.0000 ug | INTRAMUSCULAR | Status: DC | PRN

## 2014-01-26 MED ORDER — IOHEXOL 300 MG/ML  SOLN
INTRAMUSCULAR | Status: DC | PRN
  Administered 2014-01-26: 10 mL

## 2014-01-26 MED ORDER — PROPOFOL 10 MG/ML IV BOLUS
INTRAVENOUS | Status: DC | PRN
  Administered 2014-01-26: 30 mg via INTRAVENOUS
  Administered 2014-01-26: 120 mg via INTRAVENOUS

## 2014-01-26 MED ORDER — SODIUM CHLORIDE 0.9 % IR SOLN
Status: DC | PRN
  Administered 2014-01-26: 2000 mL via INTRAVESICAL

## 2014-01-26 MED ORDER — EPHEDRINE SULFATE 50 MG/ML IJ SOLN
INTRAMUSCULAR | Status: DC | PRN
  Administered 2014-01-26: 10 mg via INTRAVENOUS

## 2014-01-26 MED ORDER — CIPROFLOXACIN IN D5W 400 MG/200ML IV SOLN
400.0000 mg | INTRAVENOUS | Status: AC
  Administered 2014-01-26: 400 mg via INTRAVENOUS

## 2014-01-26 MED ORDER — STERILE WATER FOR IRRIGATION IR SOLN
Status: DC | PRN
  Administered 2014-01-26: 3000 mL

## 2014-01-26 MED ORDER — OXYBUTYNIN CHLORIDE 5 MG PO TABS: 5.0000 mg | ORAL_TABLET | Freq: Four times a day (QID) | ORAL | Status: DC | PRN

## 2014-01-26 MED ORDER — LACTATED RINGERS IV SOLN
INTRAVENOUS | Status: DC | PRN
  Administered 2014-01-26: 12:00:00 via INTRAVENOUS

## 2014-01-26 MED ORDER — LIDOCAINE HCL 2 % EX GEL
CUTANEOUS | Status: DC | PRN
  Administered 2014-01-26: 1

## 2014-01-26 MED ORDER — EPHEDRINE SULFATE 50 MG/ML IJ SOLN
INTRAMUSCULAR | Status: AC
  Filled 2014-01-26: qty 1

## 2014-01-26 SURGICAL SUPPLY — 28 items
BAG URO CATCHER STRL LF (DRAPE) ×2 IMPLANT
BALLN NEPHROSTOMY (BALLOONS) ×2
BALLOON NEPHROSTOMY (BALLOONS) ×1 IMPLANT
BASKET LASER NITINOL 1.9FR (BASKET) IMPLANT
BASKET STNLS GEMINI 4WIRE 3FR (BASKET) IMPLANT
BASKET ZERO TIP NITINOL 2.4FR (BASKET) IMPLANT
CATH CLEAR GEL 3F BACKSTOP (CATHETERS) IMPLANT
CATH FOLEY 2W COUNCIL 20FR 5CC (CATHETERS) ×2 IMPLANT
CATH URET 5FR 28IN CONE TIP (BALLOONS)
CATH URET 5FR 28IN OPEN ENDED (CATHETERS) IMPLANT
CATH URET 5FR 70CM CONE TIP (BALLOONS) IMPLANT
CATH URET DUAL LUMEN 6-10FR 50 (CATHETERS) IMPLANT
CLOTH BEACON ORANGE TIMEOUT ST (SAFETY) ×2 IMPLANT
DRAPE CAMERA CLOSED 9X96 (DRAPES) ×2 IMPLANT
FIBER LASER FLEXIVA 200 (UROLOGICAL SUPPLIES) IMPLANT
FIBER LASER FLEXIVA 365 (UROLOGICAL SUPPLIES) IMPLANT
GLOVE BIOGEL M 7.0 STRL (GLOVE) ×2 IMPLANT
GOWN STRL REUS W/TWL LRG LVL3 (GOWN DISPOSABLE) ×2 IMPLANT
GUIDEWIRE ANG ZIPWIRE 038X150 (WIRE) IMPLANT
GUIDEWIRE STR DUAL SENSOR (WIRE) ×2 IMPLANT
IV NS IRRIG 3000ML ARTHROMATIC (IV SOLUTION) ×2 IMPLANT
PACK CYSTO (CUSTOM PROCEDURE TRAY) ×2 IMPLANT
SCRUB PCMX 4 OZ (MISCELLANEOUS) IMPLANT
SHEATH ACCESS URETERAL 38CM (SHEATH) IMPLANT
SHEATH URET ACCESS 12FR/35CM (UROLOGICAL SUPPLIES) IMPLANT
SHEATH URET ACCESS 12FR/55CM (UROLOGICAL SUPPLIES) IMPLANT
SYRINGE IRR TOOMEY STRL 70CC (SYRINGE) IMPLANT
TUBING CONNECTING 10 (TUBING) ×2 IMPLANT

## 2014-01-26 NOTE — Anesthesia Postprocedure Evaluation (Signed)
  Anesthesia Post-op Note  Patient: [name]  Procedure(s) Performed: Procedure(s) (LRB): CYSTOSCOPY WITH URETHRAL DILATATION, BLADDER BIOPSY, FOLEY CATHETER (N/A)  Patient Location: PACU  Anesthesia Type: General  Level of Consciousness: awake and alert   Airway and Oxygen Therapy: Patient Spontanous Breathing  Post-op Pain: mild  Post-op Assessment: Post-op Vital signs reviewed, Patient's Cardiovascular Status Stable, Respiratory Function Stable, Patent Airway and No signs of Nausea or vomiting  Last Vitals:  Filed Vitals:   01/26/14 1445  BP: 146/69  Pulse: 59  Temp: 36.6 C  Resp: 15    Post-op Vital Signs: stable   Complications: No apparent anesthesia complications

## 2014-01-26 NOTE — Anesthesia Preprocedure Evaluation (Addendum)
Anesthesia Evaluation  Patient identified by MRN, date of birth, ID band Patient awake    Reviewed: Allergy & Precautions, H&P , NPO status , Patient's Chart, lab work & pertinent test results, reviewed documented beta blocker date and time   Airway Mallampati: II TM Distance: >3 FB Neck ROM: full    Dental no notable dental hx. (+) Teeth Intact, Dental Advisory Given   Pulmonary sleep apnea , former smoker,  breath sounds clear to auscultation  Pulmonary exam normal       Cardiovascular Exercise Tolerance: Good hypertension, Pt. on home beta blockers + CAD and + CABG Rhythm:regular Rate:Normal  Angioplasty 2006 CABG 2014  AAA   Neuro/Psych vertigo negative neurological ROS  negative psych ROS   GI/Hepatic negative GI ROS, Neg liver ROS, hiatal hernia, GERD-  Medicated and Controlled,  Endo/Other  negative endocrine ROS  Renal/GU negative Renal ROS  negative genitourinary   Musculoskeletal   Abdominal   Peds  Hematology negative hematology ROS (+)   Anesthesia Other Findings   Reproductive/Obstetrics negative OB ROS                         Anesthesia Physical Anesthesia Plan  ASA: III  Anesthesia Plan: General   Post-op Pain Management:    Induction: Intravenous  Airway Management Planned: LMA  Additional Equipment:   Intra-op Plan:   Post-operative Plan:   Informed Consent: I have reviewed the patients History and Physical, chart, labs and discussed the procedure including the risks, benefits and alternatives for the proposed anesthesia with the patient or authorized representative who has indicated his/her understanding and acceptance.   Dental Advisory Given  Plan Discussed with: CRNA and Surgeon  Anesthesia Plan Comments:         Anesthesia Quick Evaluation

## 2014-01-26 NOTE — H&P (Signed)
Urology History and Physical Exam  CC: Urethral stricture.  HPI:  78 year old male presents today for urethral stricture.  He has a history of radical prostatectomy in 2006 followed by salvage radiation therapy for biochemical recurrence in 2013.  He had worsening dysuria and weak urinary stream.  Office cystoscopy revealed a urethral stricture.  This was located in the bulbar urethra.  It was proximal to the internal sphincter.  The opening was approximately 10 [place] or less.  This prevented passage of cystoscope.  We discussed management options and he presents today for cystoscopy, balloon dilation of urethral stricture, possible direct visual  internal ureterotomy.  He understands that he will need to have a catheter placed for this.  We've discussed risks, benefits, side effects, and alternatives as well as likelihood of achieving goals. UA 01/06/14 was negative for signs of infection.  He has been cleared by his cardiologist, Dr. Acie Fredrickson, for the upcoming surgery and he is to continue his 81 mg of aspirin.  PMH: Past Medical History  Diagnosis Date  . HYPERLIPIDEMIA 04/06/2010  . BENIGN POSITIONAL VERTIGO 04/06/2010  . HYPERTENSION 04/06/2010  . GERD 04/06/2010  . SKIN LESION 04/06/2010  . Acute sinusitis, unspecified 11/27/2010  . Anal fissure   . Allergy     bee sting,,amoxicillin, hydrocodone  . Joint pain   . OSTEOARTHRITIS, GENERALIZED 04/06/2010  . History of radiation therapy 08/24/12-10/22/12    prostate 6600cGy/33 sessions  . B12 deficiency   . S/P CABG x 2   . Prostate cancer 09/06/05 dx    adenocarcinoma gleason=3+4=7,pSA=<0.01-0.13-212; PROSTATECTOMY FOLLOWED BY RADIATION  . H/O hiatal hernia   . Sleep apnea     NEVER USED OR HAS CPAP   . CAD 04/06/2010      DR. NAHSER IS P'T CARDIOLOGIST  . Coronary artery disease 2006    angioplasty    PSH: Past Surgical History  Procedure Laterality Date  . Angioplasty  1991  . Esophagas dilatation  1992  . Hernia repair  1994    b/l  groin area  . Prostate surgery  11/18/05    prostatectomy, adenocarcinoma radical  . Knee surgery      right arthoscopic  . Coronary artery bypass graft  08-27-13    CABGx2  . Coronary artery bypass graft N/A 08/27/2013    Procedure: CORONARY ARTERY BYPASS GRAFTING (CABG) times two using left internal mammary and right saphenous vein using endoscope.;  Surgeon: Melrose Nakayama, MD;  Location: [place];  Service: Open Heart Surgery;  Laterality: N/A;  . Back surgery      LUMBAR DISCECTOMY    Allergies: Allergies  Allergen Reactions  . Bee Venom Other (See Comments)    Reaction unknown  . Amoxicillin Rash  . Hydrocodone Other (See Comments)    REACTION: disorientation    Medications: No prescriptions prior to admission     Social History: History   Social History  . Marital Status: Married    Spouse Name: N/A    Number of Children: 2  . Years of Education: N/A   Occupational History  . Retired    Social History Main Topics  . Smoking status: Former Smoker -- 2.00 packs/day for 10 years    Types: Cigarettes    Quit date: 12/03/1994  . Smokeless tobacco: Not on file  . Alcohol Use: Yes     Comment: occasionally  . Drug Use: No  . Sexual Activity: Not on file   Other Topics Concern  . Not on file  Social History Narrative  . No narrative on file    Family History: Family History  Problem Relation Age of Onset  . Cancer Father     prostate died 53 something  . Arthritis Other   . Hyperlipidemia Other   . Hypertension Other   . Cancer Brother     prostate cancer    Review of Systems: Positive: Weak stream. Negative: Fever, SOB, or chest pain.  A further 10 point review of systems was negative except what is listed in the HPI.  Physical Exam: Filed Vitals:   01/26/14 1119  BP: 141/65  Pulse: 69  Temp: 97.7 F (36.5 C)  Resp: 18    General: No acute distress.  Awake. Head:  Normocephalic.  Atraumatic. ENT:  EOMI.  Mucous membranes  moist Neck:  Supple.  No lymphadenopathy. CV:  S1 present. S2 present. Regular rate. Pulmonary: Equal effort bilaterally.  Clear to auscultation bilaterally. Abdomen: Soft.  Non- tender to palpation. Skin:  Normal turgor.  No visible rash. Extremity: No gross deformity of bilateral upper extremities.  No gross deformity of    bilateral lower extremities. Neurologic: Alert. Appropriate mood.    Studies:  Recent Labs     01/24/14  1350  HGB  13.8  WBC  5.6  PLT  136*    Recent Labs     01/24/14  1350  NA  138  K  5.1  CL  103  CO2  25  BUN  20  CREATININE  1.45*  CALCIUM  9.5  GFRNONAA  46*  GFRAA  53*     No results found for this basename: PT, INR, APTT,  in the last 72 hours   No components found with this basename: ABG,     Assessment:  Urethral stricture.  Plan: To OR for cystoscopy, balloon dilation of urethral stricture, possible direct visual  internal ureterotomy.

## 2014-01-26 NOTE — Progress Notes (Signed)
Dr. Jasmine December in and talked with patient.

## 2014-01-26 NOTE — Op Note (Signed)
Urology Operative Report  Date of Procedure: 01/26/14  Surgeon: Rolan Bucco, MD Assistant:  None  Preoperative Diagnosis: Urethral stricture. Postoperative Diagnosis: Urethral stricture. Bladder lesion.  Procedure(s): Cystoscopy with urethral dilation. Bladder biopsy. Foley catheter placement.  Estimated blood loss: Minimal  Specimen: Bladder biopsy.  Drains: 20Fr foley catheter  Complications: None  Findings: He was noted to have a bulbar urethral stricture. He also had some mild strictures in the anterior urethra which did not require dilation. These did make passage of cystoscope a little tight. He was also noted to have an erythematous bladder lesion which was flat on the right side of the lateral bladder wall. This was biopsied.  History of present illness: 78 year old male with a history of prostate cancer treated with prostatectomy and and subsequent radiation therapy presents today for worsening urinary symptoms and was found to have a bulbar urethral stricture. He is here today for dilation of stricture possible direct visual internal urethrotomy.   Procedure in detail: After informed consent was obtained, the patient was taken to the operating room. They were placed in the supine position. SCDs were turned on and in place. IV antibiotics were infused, and general anesthesia was induced. A timeout was performed in which the correct patient, surgical site, and procedure were identified and agreed upon by the team.  The patient was placed in a dorsolithotomy position, making sure to pad all pertinent neurovascular pressure points. A belladonna and opium suppository was placed into the rectum. The genitals were prepped and draped in the usual sterile fashion.  A rigid cystoscope was advanced through the urethra. He was noted to have several areas of stricture-like formation in the anterior urethra which I was able to pass through with the rigid cystoscope, but there was  some mild resistance due to this. In the bulbar urethra I encountered a urethral stricture which have progressed and was now more severe side rather than just a moderate stricture.  I performed fluoroscopy with the cystoscope at the opening of the stricture to note where the stricture was located. I then passed a sensor wire through this area and into the bladder. I removed the cystoscope and placed a balloon dilator over the wire across the area where the stricture was located with the tip of the balloon dilator in the bladder. I then inflated the balloon was filled with contrast under fluoroscopy. This was inflated up to 8 atmospheres and held for 3 minutes. The balloon was then deflated and the balloon was removed. The wire was secured to the drape as a safety wire.   The rigid cystoscope was then able to be passed through the bulbar urethra and into the bladder. The bladder was drained and then filled daily and evaluated in a systematic fashion to visualize the entire surface of the bladder. There was noted to be a flat erythematous area on the right side of the bladder wall. Given his history of pelvic radiation I felt it would be most appropriate to biopsy this area. Cold cup biopsy forceps were used to biopsy this area and then a Bugbee electrode was used to fulgurate the biopsy site. There was good hemostasis.  I then withdrew the cystoscope into the urethra and visualized the area of the former stricture. There was no tissue for cutting for direct visual internal urethra. There was one area which was oozing and I gently cauterized this with Bugbee electrocautery. The cystoscope was then removed.  I placed 10 cc of lidocaine jelly into the urethra,  and then I placed a 20 [place] council-tip catheter over the wire and into the bladder. I then inflated this with 10 cc of sterile water.  This completed the procedure. The patient was placed back in a supine position he was taken to the PACU in stable  condition after anesthesia was reversed.  All counts were correct at the end of the case.  He will followup as scheduled 02/07/14. He be given ciprofloxacin to take beginning the day prior to his catheter removal.

## 2014-01-26 NOTE — Preoperative (Signed)
Beta Blockers   Reason not to administer Beta Blockers:Not Applicable 

## 2014-01-26 NOTE — Discharge Instructions (Signed)
DISCHARGE INSTRUCTIONS FOR TRANSURETHRAL SURGERY  MEDICATIONS:   1. Resume all your other meds from home.   ACTIVITY 1. No heavy lifting >10 pounds for 2 weeks 2. No sexual activity for 2 weeks 3. No strenuous activity for 2 weeks 4. No driving while on narcotic pain medications 5. Drink plenty of water 6. Continue to walk at home - you can still get blood clots when you are at  home, so keep active, but don't over do it. 7. Your urine may have some blood in it - make sure you drink plenty of water,  call or come to the ER immediately if your catheter stops draining  FOLEY CATHETER  (If you go home with a catheter in place.) 1. Make sure your catheter is attached to your leg at all times - do not let  anything pull on it 2. If the urine is your tube starts looking dark red or if it stops draining,  call us immediately or come to the ER 3. Drink plenty of water, if you do notice your urine looking darking, sit down,  relax and drink lots of water 4. You will be given a leg bag as well as an overnight bag for your catheter -  MAKE SURE ATTACHED TO YOUR LEG AT ALL TIMES WITH TAPE OR LEG STRAP  BATHING 1. You can shower.  You may take a bath unless you have a Foley catheter in place.  SIGNS/SYMPTOMS TO CALL: 1. Please call us if you have a fever greater than 101.5, uncontrolled  nausea/vomiting, uncontrolled pain, dizziness, unable to urinate, chest pain, shortness of breath, leg swelling, leg pain, or any other concerns  or questions.  You can reach [place] at [phone].      Indwelling Urinary Catheter Care You have been given a flexible tube (catheter) used to drain the bladder. Catheters are often used when a person has difficulty urinating due to blockage, bleeding, infection, or inability to control bladder or bowel movements (incontinence). A catheter requires daily care to prevent infection and blockage. HOME CARE INSTRUCTIONS  Do the following to reduce the risk of  infection. Antibiotic medicines cannot prevent infections. Limit the number of bacteria entering your bladder  Wash your hands for 2 minutes with soapy water before and after handling the catheter.  Wash your bottom and the entire catheter twice daily, as well as after each bowel movement. Wash the tip of the penis or just above the vaginal opening with soap and warm water, rinse, and then wash the rectal area. Always wash from front to back.  When changing from the leg bag to overnight bag or from the overnight bag to leg bag, thoroughly clean the end of the catheter where it connects to the tubing with an alcohol wipe.  Clean the leg bag and overnight bag daily after use. Replace your drainage bags weekly.  Always keep the tubing and bag below the level of your bladder. This allows your urine to drain properly. Lifting the bag or tubing above the level of your bladder will cause dirty urine to flow back into your bladder. If you must briefly lift the bag higher than your bladder, pinch the catheter or tubing to prevent backflow.  Drink enough water and fluids to keep your urine clear or pale yellow, or as directed by your caregiver. This will flush bacteria out of the bladder. Protect tissues from injury  Attach the catheter to your leg so there is no tension on the catheter.  Use adhesive tape or a leg strap. If you are using adhesive tape, remove any sticky residue left behind by the previous tape you used.  Place your leg bag on your lower leg. Fasten the straps securely and comfortably.  Do not remove the catheter yourself unless you have been instructed how to do so. Keep the urinary pathway open  Check throughout the day to be sure your catheter is working and urine is draining freely. Make sure the tubing does not become kinked.  Do not let the drainage bag overfill. SEEK IMMEDIATE MEDICAL CARE IF:   The catheter becomes blocked. Urine is not draining.  Urine is leaking.  You  have any pain.  You have a fever. Document Released: 11/18/2005 Document Revised: 11/04/2012 Document Reviewed: 04/19/2010 [facility] Patient Information 2014 [place].

## 2014-01-26 NOTE — Transfer of Care (Signed)
Immediate Anesthesia Transfer of Care Note  Patient: [name]  Procedure(s) Performed: Procedure(s): CYSTOSCOPY WITH URETHRAL DILATATION, BLADDER BIOPSY, FOLEY CATHETER (N/A)  Patient Location: PACU  Anesthesia Type:General  Level of Consciousness: awake, alert , oriented and patient cooperative  Airway & Oxygen Therapy: Patient Spontanous Breathing and Patient connected to face mask oxygen  Post-op Assessment: Report given to PACU RN and Post -op Vital signs reviewed and stable  Post vital signs: Reviewed and stable  Complications: No apparent anesthesia complications

## 2014-01-26 NOTE — Progress Notes (Signed)
Dr. Landry Dyke notified of patient's End Tidal CO2 readings and length of stay in PACU- O.K. To go to Short Stay now

## 2014-01-28 ENCOUNTER — Encounter (HOSPITAL_COMMUNITY): Payer: Self-pay | Admitting: Urology

## 2014-02-07 DIAGNOSIS — D414 Neoplasm of uncertain behavior of bladder: Secondary | ICD-10-CM | POA: Diagnosis not present

## 2014-02-07 DIAGNOSIS — N35919 Unspecified urethral stricture, male, unspecified site: Secondary | ICD-10-CM | POA: Diagnosis not present

## 2014-02-22 ENCOUNTER — Ambulatory Visit: Payer: Medicare Other | Admitting: Cardiovascular Disease

## 2014-02-23 DIAGNOSIS — N35919 Unspecified urethral stricture, male, unspecified site: Secondary | ICD-10-CM | POA: Diagnosis not present

## 2014-02-23 DIAGNOSIS — N393 Stress incontinence (female) (male): Secondary | ICD-10-CM | POA: Diagnosis not present

## 2014-05-05 DIAGNOSIS — C61 Malignant neoplasm of prostate: Secondary | ICD-10-CM | POA: Diagnosis not present

## 2014-05-05 DIAGNOSIS — Z8546 Personal history of malignant neoplasm of prostate: Secondary | ICD-10-CM | POA: Diagnosis not present

## 2014-05-11 ENCOUNTER — Other Ambulatory Visit: Payer: Medicare Other

## 2014-05-11 ENCOUNTER — Ambulatory Visit (INDEPENDENT_AMBULATORY_CARE_PROVIDER_SITE_OTHER): Payer: Medicare Other | Admitting: Cardiovascular Disease

## 2014-05-11 ENCOUNTER — Encounter: Payer: Self-pay | Admitting: Cardiovascular Disease

## 2014-05-11 VITALS — BP 150/80 | HR 62 | Ht 68.5 in | Wt 150.8 lb

## 2014-05-11 DIAGNOSIS — I2510 Atherosclerotic heart disease of native coronary artery without angina pectoris: Secondary | ICD-10-CM | POA: Diagnosis not present

## 2014-05-11 DIAGNOSIS — I25810 Atherosclerosis of coronary artery bypass graft(s) without angina pectoris: Secondary | ICD-10-CM | POA: Diagnosis not present

## 2014-05-11 LAB — BASIC METABOLIC PANEL
BUN: 20 mg/dL (ref 6–23)
CO2: 29 mEq/L (ref 19–32)
Calcium: 9.8 mg/dL (ref 8.4–10.5)
Chloride: 103 mEq/L (ref 96–112)
Creatinine, Ser: 1.6 mg/dL — ABNORMAL HIGH (ref 0.4–1.5)
GFR: 44.22 mL/min — ABNORMAL LOW (ref >60.00)
Glucose, Bld: 93 mg/dL (ref 70–99)
Potassium: 5.1 mEq/L (ref 3.5–5.1)
Sodium: 139 mEq/L (ref 135–145)

## 2014-05-11 LAB — LIPID PANEL
Cholesterol: 120 mg/dL (ref 0–200)
HDL: 41.6 mg/dL (ref >39.00)
LDL Cholesterol: 63 mg/dL (ref 0–99)
NonHDL: 78.4
Total CHOL/HDL Ratio: 3
Triglycerides: 75 mg/dL (ref 0.0–149.0)
VLDL: 15 mg/dL (ref 0.0–40.0)

## 2014-05-11 LAB — HEPATIC FUNCTION PANEL
ALT: 29 U/L (ref 0–53)
AST: 26 U/L (ref 0–37)
Albumin: 4.1 g/dL (ref 3.5–5.2)
Alkaline Phosphatase: 61 U/L (ref 39–117)
Bilirubin, Direct: 0.1 mg/dL (ref 0.0–0.3)
Total Bilirubin: 0.7 mg/dL (ref 0.2–1.2)
Total Protein: 6.8 g/dL (ref 6.0–8.3)

## 2014-05-11 MED ORDER — ASPIRIN 81 MG PO TBEC: 81.0000 mg | DELAYED_RELEASE_TABLET | Freq: Every day | ORAL | Status: AC

## 2014-05-11 NOTE — Assessment & Plan Note (Signed)
[name] has done very well. He had coronary artery bypass grafting about a year ago. He's been asymptomatic. Continue with his same medications.

## 2014-05-11 NOTE — Patient Instructions (Signed)
Your physician wants you to follow-up in: [place] DR. Acie Fredrickson You will receive a reminder letter in the mail two months in advance. If you don't receive a letter, please call our office to schedule the follow-up appointment.   Your physician has recommended you make the following change in your medication:  1. DECERASE ASPRIN 50 MG DAILY   Your physician recommends that you return for a FASTING lipid profile: LIVER BMET  Your physician recommends that you continue on your current medications as directed. Please refer to the Current Medication list given to you today.

## 2014-05-11 NOTE — Progress Notes (Signed)
[name] Date of Birth  [birth year] [place] [address]    [place]   [place], [place]  [address]    [place], [place]  [address] [phone]  Fax  [phone]  [phone]  Fax [phone]  Problem list: 1. Coronary artery disease- status post remote PTCA to the left complex artery in 1991, CABG 2014 2. Hypertension 3. Hyperlipidemia 4. Prostate Cancer.  History of Present Illness:  [name] is  a 78 y.o. -year-old gentleman with a history of PTCA approximately 22 years ago.  He's not had any episodes of chest pain or shortness of breath. He stays fairly active although he doesn't exercise at the gym.  He has some problems with some arthritis-type pain in his right hip.  The Lipitor causes a little bit of muscular skeletal pain.  May 10, 2013:  He stopped hib Benicar 2 months ago. His BP has been running low at home.  He has been on a new medicine for his prostate and was concerned that his BP would run too low.   He has been taking a varied dose of benicar - 1 tab, 1/2 tab, 1/4 tablet whenever he thinks he needs it.   Sept. 5, 2014:  [name] presents today for worsening shortness of breath and chest pain.  Last Saturday, he mowed the lawn and spread 200 lbs of lime.  He developed severe CP and started to take some NTG.  The pain finally eased up. Several days later, he had recurrent CP while cleaning out the gutters.    He thinks he was just overheated.   He has been recording his BP and has been getting some low readings.     He has been having some hip pain and does not know if he will be able to walk on the treadmill.   Sept. 24, 2014: [name] was seen recently with some worsening chest pain. He had a stress Myoview study which was interpreted as low risk he has a fixed inferoapical defect.  He exertion, he is still having angina.  Lasts 5 minutes. In the center of his chest. No radiatioin.  Occurs only with  exertion ( stating mower or Rototiller- never walking around the house.)   Class II - III angina.    Yesterday, he had some mild baseline CP which worsened while walking around the grocery store.   Dec. 16, 2014:  [name] has had CABG since I last saw him in the office.  Still has chest tenderness - breathing is good, no CP  Chest wall is still tender.  May 11, 2014:  [name] is doing ok.   Current Outpatient Prescriptions on File Prior to Visit  Medication Sig Dispense Refill  . aspirin EC 325 MG EC tablet Take 1 tablet (325 mg total) by mouth daily.      [name] atorvastatin (LIPITOR) 40 MG tablet Take 40 mg by mouth at bedtime.       . metoprolol succinate (TOPROL-XL) 25 MG 24 hr tablet Take 25 mg by mouth every morning.      . Multiple Vitamin (MULTIVITAMIN WITH MINERALS) TABS tablet Take 1 tablet by mouth daily.      . nitroGLYCERIN (NITROSTAT) 0.4 MG SL tablet Place 1 tablet (0.4 mg total) under the tongue every 5 (five) minutes as needed for chest pain.  25 tablet  PRN  . pantoprazole (PROTONIX) 40 MG tablet Take 40 mg by  mouth. EVERY OTHER DAY      . polyethylene glycol (MIRALAX / GLYCOLAX) packet Take 17 g by mouth daily as needed (for constipation).       [name] senna-docusate (SENOKOT S) 8.6-50 MG per tablet Take 1 tablet by mouth 2 (two) times daily.  60 tablet  0   No current facility-administered medications on file prior to visit.    Allergies  Allergen Reactions  . Bee Venom Other (See Comments)    Reaction unknown  . Amoxicillin Rash  . Hydrocodone Other (See Comments)    REACTION: disorientation    Past Medical History  Diagnosis Date  . HYPERLIPIDEMIA 04/06/2010  . BENIGN POSITIONAL VERTIGO 04/06/2010  . HYPERTENSION 04/06/2010  . GERD 04/06/2010  . SKIN LESION 04/06/2010  . Acute sinusitis, unspecified 11/27/2010  . Anal fissure   . Allergy     bee sting,,amoxicillin, hydrocodone  . Joint pain   . OSTEOARTHRITIS, GENERALIZED 04/06/2010  . History of radiation therapy  08/24/12-10/22/12    prostate 6600cGy/33 sessions  . B12 deficiency   . S/P CABG x 2   . Prostate cancer 09/06/05 dx    adenocarcinoma gleason=3+4=7,pSA=<0.01-0.13-212; PROSTATECTOMY FOLLOWED BY RADIATION  . H/O hiatal hernia   . Sleep apnea     NEVER USED OR HAS CPAP   . CAD 04/06/2010      DR. NAHSER IS P'T CARDIOLOGIST  . Coronary artery disease 2006    angioplasty    Past Surgical History  Procedure Laterality Date  . Angioplasty  1991  . Esophagas dilatation  1992  . Hernia repair  1994    b/l groin area  . Prostate surgery  11/18/05    prostatectomy, adenocarcinoma radical  . Knee surgery      right arthoscopic  . Coronary artery bypass graft  08-27-13    CABGx2  . Coronary artery bypass graft N/A 08/27/2013    Procedure: CORONARY ARTERY BYPASS GRAFTING (CABG) times two using left internal mammary and right saphenous vein using endoscope.;  Surgeon: Melrose Nakayama, MD;  Location: [facility];  Service: Open Heart Surgery;  Laterality: N/A;  . Back surgery      LUMBAR DISCECTOMY  . Cystoscopy with urethral dilatation N/A 01/26/2014    Procedure: CYSTOSCOPY WITH URETHRAL DILATATION, BLADDER BIOPSY, FOLEY CATHETER;  Surgeon: Molli Hazard, MD;  Location: [facility];  Service: Urology;  Laterality: N/A;    History  Smoking status  . Former Smoker -- 2.00 packs/day for 10 years  . Types: Cigarettes  . Quit date: 12/03/1994  Smokeless tobacco  . Not on file    History  Alcohol Use  . Yes    Comment: occasionally    Family History  Problem Relation Age of Onset  . Cancer Father     prostate died 36 something  . Arthritis Other   . Hyperlipidemia Other   . Hypertension Other   . Cancer Brother     prostate cancer    Reviw of Systems:  Reviewed in the HPI.  All other systems are negative.  Physical Exam: Blood pressure 150/80, pulse 62, height 5' 8.5" (1.74 m), weight 150 lb 12.8 oz (68.402 kg). General: Well developed, well nourished, in no acute  distress.  Head: Normocephalic, atraumatic, sclera non-icteric, mucus membranes are moist,   Neck: Supple. Negative for carotid bruits. JVD not elevated.  Lungs: Clear bilaterally to auscultation without wheezes, rales, or rhonchi. Breathing is unlabored.  Heart: RRR with S1 S2. No murmurs, rubs, or gallops appreciated.  Abdomen: Soft, non-tender, non-distended with normoactive bowel sounds. No hepatomegaly. No rebound/guarding. No obvious abdominal masses.  Msk:  Strength and tone appear normal for age.  Extremities: No clubbing or cyanosis. No edema.  Distal pedal pulses are 2+ and equal bilaterally.  Neuro: Alert and oriented X 3. Moves all extremities spontaneously.  Psych:  Responds to questions appropriately with a normal affect.  ECG: Sept. 5, 2014:  Sinus brady at 73.  LAE, no ST or T wave abnormalities  Assessment / Plan:

## 2014-05-12 DIAGNOSIS — Z8551 Personal history of malignant neoplasm of bladder: Secondary | ICD-10-CM | POA: Diagnosis not present

## 2014-05-12 DIAGNOSIS — N393 Stress incontinence (female) (male): Secondary | ICD-10-CM | POA: Diagnosis not present

## 2014-05-12 DIAGNOSIS — N35919 Unspecified urethral stricture, male, unspecified site: Secondary | ICD-10-CM | POA: Diagnosis not present

## 2014-05-12 DIAGNOSIS — C61 Malignant neoplasm of prostate: Secondary | ICD-10-CM | POA: Diagnosis not present

## 2014-05-13 ENCOUNTER — Other Ambulatory Visit: Payer: Self-pay | Admitting: Urology

## 2014-05-25 ENCOUNTER — Encounter (HOSPITAL_BASED_OUTPATIENT_CLINIC_OR_DEPARTMENT_OTHER): Payer: Self-pay | Admitting: *Deleted

## 2014-05-25 NOTE — Progress Notes (Signed)
NPO AFTER MN WITH CLEAR LIQUIDS UNTIL 0830 (NO CREAM/ MILK PRODUCTS).  ARRIVE AT 1300. CURRENT LAB RESULTS, EKG AND CXR IN CHART AND EPIC. WILL TAKE METOPROLOL AND PROTONIX AM DOS W/ SIPS OF WATER.

## 2014-05-25 NOTE — Progress Notes (Signed)
05/25/14 1454  OBSTRUCTIVE SLEEP APNEA  Have you ever been diagnosed with sleep apnea through a sleep study? No  Do you snore loudly (loud enough to be heard through closed doors)?  1  Do you often feel tired, fatigued, or sleepy during the daytime? 1  Has anyone observed you stop breathing during your sleep? 1  Do you have, or are you being treated for high blood pressure? 1  BMI more than 35 kg/m2? 0  Age over 76 years old? 1  Neck circumference greater than 40 cm/16 inches? 0  Gender: 1  Obstructive Sleep Apnea Score 6  Score 4 or greater  Results sent to PCP

## 2014-05-30 ENCOUNTER — Ambulatory Visit (HOSPITAL_BASED_OUTPATIENT_CLINIC_OR_DEPARTMENT_OTHER)
Admission: RE | Admit: 2014-05-30 | Discharge: 2014-05-30 | Disposition: A | Discharge status: Home / Self Care | Payer: Medicare Other | Source: Ambulatory Visit | Attending: Urology | Admitting: Urology

## 2014-05-30 ENCOUNTER — Encounter (HOSPITAL_BASED_OUTPATIENT_CLINIC_OR_DEPARTMENT_OTHER): Payer: Self-pay | Admitting: *Deleted

## 2014-05-30 ENCOUNTER — Encounter (HOSPITAL_BASED_OUTPATIENT_CLINIC_OR_DEPARTMENT_OTHER)
Admission: RE | Disposition: A | Discharge status: Home / Self Care | Payer: Self-pay | Source: Ambulatory Visit | Attending: Urology

## 2014-05-30 ENCOUNTER — Encounter (HOSPITAL_BASED_OUTPATIENT_CLINIC_OR_DEPARTMENT_OTHER): Payer: Medicare Other | Admitting: Anesthesiology

## 2014-05-30 ENCOUNTER — Ambulatory Visit (HOSPITAL_BASED_OUTPATIENT_CLINIC_OR_DEPARTMENT_OTHER): Payer: Medicare Other | Admitting: Anesthesiology

## 2014-05-30 DIAGNOSIS — I10 Essential (primary) hypertension: Secondary | ICD-10-CM | POA: Insufficient documentation

## 2014-05-30 DIAGNOSIS — Z8546 Personal history of malignant neoplasm of prostate: Secondary | ICD-10-CM | POA: Diagnosis not present

## 2014-05-30 DIAGNOSIS — N320 Bladder-neck obstruction: Secondary | ICD-10-CM

## 2014-05-30 DIAGNOSIS — K449 Diaphragmatic hernia without obstruction or gangrene: Secondary | ICD-10-CM | POA: Diagnosis not present

## 2014-05-30 DIAGNOSIS — Z79899 Other long term (current) drug therapy: Secondary | ICD-10-CM | POA: Insufficient documentation

## 2014-05-30 DIAGNOSIS — Z87891 Personal history of nicotine dependence: Secondary | ICD-10-CM | POA: Insufficient documentation

## 2014-05-30 DIAGNOSIS — N35919 Unspecified urethral stricture, male, unspecified site: Secondary | ICD-10-CM | POA: Diagnosis not present

## 2014-05-30 DIAGNOSIS — Z951 Presence of aortocoronary bypass graft: Secondary | ICD-10-CM | POA: Insufficient documentation

## 2014-05-30 DIAGNOSIS — G4730 Sleep apnea, unspecified: Secondary | ICD-10-CM | POA: Diagnosis not present

## 2014-05-30 DIAGNOSIS — I2510 Atherosclerotic heart disease of native coronary artery without angina pectoris: Secondary | ICD-10-CM | POA: Diagnosis not present

## 2014-05-30 DIAGNOSIS — Z923 Personal history of irradiation: Secondary | ICD-10-CM | POA: Diagnosis not present

## 2014-05-30 DIAGNOSIS — K219 Gastro-esophageal reflux disease without esophagitis: Secondary | ICD-10-CM | POA: Diagnosis not present

## 2014-05-30 DIAGNOSIS — M1990 Unspecified osteoarthritis, unspecified site: Secondary | ICD-10-CM | POA: Insufficient documentation

## 2014-05-30 DIAGNOSIS — C679 Malignant neoplasm of bladder, unspecified: Secondary | ICD-10-CM | POA: Diagnosis not present

## 2014-05-30 HISTORY — DX: Unspecified osteoarthritis, unspecified site: M19.90

## 2014-05-30 HISTORY — DX: Personal history of other diseases of the digestive system: Z87.19

## 2014-05-30 HISTORY — PX: URETHROTOMY: SHX1083

## 2014-05-30 HISTORY — DX: Diverticulosis of large intestine without perforation or abscess without bleeding: K57.30

## 2014-05-30 HISTORY — DX: Malignant neoplasm of prostate: C61

## 2014-05-30 HISTORY — DX: Other specified postprocedural states: Z98.890

## 2014-05-30 HISTORY — DX: Other specified personal risk factors, not elsewhere classified: Z91.89

## 2014-05-30 HISTORY — DX: Stress incontinence (female) (male): N39.3

## 2014-05-30 HISTORY — DX: Malignant neoplasm of bladder, unspecified: C67.9

## 2014-05-30 HISTORY — DX: Personal history of colonic polyps: Z86.010

## 2014-05-30 HISTORY — DX: Benign paroxysmal vertigo, unspecified ear: H81.10

## 2014-05-30 HISTORY — DX: Presence of spectacles and contact lenses: Z97.3

## 2014-05-30 HISTORY — DX: Male erectile dysfunction, unspecified: N52.9

## 2014-05-30 HISTORY — DX: Hyperlipidemia, unspecified: E78.5

## 2014-05-30 HISTORY — DX: Personal history of colon polyps, unspecified: Z86.0100

## 2014-05-30 HISTORY — DX: Unspecified urethral stricture, male, unspecified site: N35.919

## 2014-05-30 HISTORY — DX: Essential (primary) hypertension: I10

## 2014-05-30 HISTORY — DX: Gastro-esophageal reflux disease without esophagitis: K21.9

## 2014-05-30 SURGERY — CYSTOSCOPY, WITH URETHROTOMY
Anesthesia: General | Site: Urethra

## 2014-05-30 MED ORDER — CIPROFLOXACIN HCL 500 MG PO TABS: 500.0000 mg | ORAL_TABLET | Freq: Two times a day (BID) | ORAL | Status: DC

## 2014-05-30 MED ORDER — LIDOCAINE HCL (CARDIAC) 20 MG/ML IV SOLN
INTRAVENOUS | Status: DC | PRN
  Administered 2014-05-30: 75 mg via INTRAVENOUS

## 2014-05-30 MED ORDER — STERILE WATER FOR IRRIGATION IR SOLN
Status: DC | PRN
  Administered 2014-05-30: 3000 mL

## 2014-05-30 MED ORDER — PHENAZOPYRIDINE HCL 200 MG PO TABS
200.0000 mg | ORAL_TABLET | Freq: Three times a day (TID) | ORAL | Status: DC | PRN
  Filled 2014-05-30: qty 1

## 2014-05-30 MED ORDER — CIPROFLOXACIN IN D5W 400 MG/200ML IV SOLN
400.0000 mg | INTRAVENOUS | Status: AC
  Administered 2014-05-30: 400 mg via INTRAVENOUS
  Filled 2014-05-30: qty 200

## 2014-05-30 MED ORDER — EPHEDRINE SULFATE 50 MG/ML IJ SOLN
INTRAMUSCULAR | Status: DC | PRN
  Administered 2014-05-30: 10 mg via INTRAVENOUS

## 2014-05-30 MED ORDER — OXYCODONE-ACETAMINOPHEN 5-325 MG PO TABS: 1.0000 | ORAL_TABLET | ORAL | Status: DC | PRN

## 2014-05-30 MED ORDER — BELLADONNA ALKALOIDS-OPIUM 16.2-60 MG RE SUPP
RECTAL | Status: AC
  Filled 2014-05-30: qty 1

## 2014-05-30 MED ORDER — DEXAMETHASONE SODIUM PHOSPHATE 4 MG/ML IJ SOLN
INTRAMUSCULAR | Status: DC | PRN
  Administered 2014-05-30: 10 mg via INTRAVENOUS

## 2014-05-30 MED ORDER — FENTANYL CITRATE 0.05 MG/ML IJ SOLN
INTRAMUSCULAR | Status: AC
  Filled 2014-05-30: qty 4

## 2014-05-30 MED ORDER — PHENAZOPYRIDINE HCL 200 MG PO TABS: 200.0000 mg | ORAL_TABLET | Freq: Three times a day (TID) | ORAL | Status: DC | PRN

## 2014-05-30 MED ORDER — BELLADONNA ALKALOIDS-OPIUM 16.2-60 MG RE SUPP
RECTAL | Status: DC | PRN
  Administered 2014-05-30: 1 via RECTAL

## 2014-05-30 MED ORDER — LIDOCAINE HCL 2 % EX GEL
CUTANEOUS | Status: DC | PRN
  Administered 2014-05-30: 1 via URETHRAL

## 2014-05-30 MED ORDER — OXYCODONE HCL 5 MG PO TABS
5.0000 mg | ORAL_TABLET | Freq: Once | ORAL | Status: DC | PRN
  Filled 2014-05-30: qty 1

## 2014-05-30 MED ORDER — SODIUM CHLORIDE 0.9 % IR SOLN
Status: DC | PRN
  Administered 2014-05-30: 3000 mL via INTRAVESICAL

## 2014-05-30 MED ORDER — HYOSCYAMINE SULFATE 0.125 MG PO TABS: 0.1250 mg | ORAL_TABLET | ORAL | Status: DC | PRN

## 2014-05-30 MED ORDER — PROPOFOL 10 MG/ML IV BOLUS
INTRAVENOUS | Status: DC | PRN
  Administered 2014-05-30: 150 mg via INTRAVENOUS

## 2014-05-30 MED ORDER — LACTATED RINGERS IV SOLN
INTRAVENOUS | Status: DC
  Administered 2014-05-30: 14:00:00 via INTRAVENOUS
  Filled 2014-05-30: qty 1000

## 2014-05-30 MED ORDER — IOHEXOL 350 MG/ML SOLN
INTRAVENOUS | Status: DC | PRN
  Administered 2014-05-30: 20 mL

## 2014-05-30 MED ORDER — MEPERIDINE HCL 25 MG/ML IJ SOLN
6.2500 mg | INTRAMUSCULAR | Status: DC | PRN
  Filled 2014-05-30: qty 1

## 2014-05-30 MED ORDER — HYDROMORPHONE HCL PF 1 MG/ML IJ SOLN
0.2500 mg | INTRAMUSCULAR | Status: DC | PRN
  Filled 2014-05-30: qty 1

## 2014-05-30 MED ORDER — OXYCODONE HCL 5 MG/5ML PO SOLN
5.0000 mg | Freq: Once | ORAL | Status: DC | PRN
  Filled 2014-05-30: qty 5

## 2014-05-30 MED ORDER — PROMETHAZINE HCL 25 MG/ML IJ SOLN
6.2500 mg | INTRAMUSCULAR | Status: DC | PRN
  Filled 2014-05-30: qty 1

## 2014-05-30 MED ORDER — FENTANYL CITRATE 0.05 MG/ML IJ SOLN
INTRAMUSCULAR | Status: DC | PRN
  Administered 2014-05-30: 50 ug via INTRAVENOUS

## 2014-05-30 SURGICAL SUPPLY — 41 items
BAG DRAIN URO-CYSTO SKYTR STRL (DRAIN) ×3 IMPLANT
BAG URINE DRAINAGE (UROLOGICAL SUPPLIES) IMPLANT
BAG URINE LEG 19OZ MD ST LTX (BAG) ×3 IMPLANT
BALLN NEPHROSTOMY (BALLOONS) ×3
BALLOON NEPHROSTOMY (BALLOONS) ×2 IMPLANT
CANISTER SUCT LVC 12 LTR MEDI- (MISCELLANEOUS) ×3 IMPLANT
CATH FOLEY 2W COUNCIL 20FR 5CC (CATHETERS) ×3 IMPLANT
CATH FOLEY 3WAY 30CC 24FR (CATHETERS)
CATH HEMA 3WAY 30CC 24FR COUDE (CATHETERS) IMPLANT
CATH HEMA 3WAY 30CC 24FR RND (CATHETERS) IMPLANT
CATH INTERMIT  6FR 70CM (CATHETERS) IMPLANT
CATH ROBINSON RED A/P 14FR (CATHETERS) IMPLANT
CATH URET 5FR 28IN CONE TIP (BALLOONS)
CATH URET 5FR 28IN OPEN ENDED (CATHETERS) IMPLANT
CATH URET 5FR 70CM CONE TIP (BALLOONS) IMPLANT
CATH URTH STD 24FR FL 3W 2 (CATHETERS) IMPLANT
CLOTH BEACON ORANGE TIMEOUT ST (SAFETY) ×3 IMPLANT
DRAPE CAMERA CLOSED 9X96 (DRAPES) ×3 IMPLANT
ELECT BUTTON HF 24-28F 2 30DE (ELECTRODE) IMPLANT
ELECT LOOP MED HF 24F 12D CBL (CLIP) IMPLANT
ELECT REM PT RETURN 9FT ADLT (ELECTROSURGICAL) ×3
ELECT RESECT VAPORIZE 12D CBL (ELECTRODE) IMPLANT
ELECTRODE REM PT RTRN 9FT ADLT (ELECTROSURGICAL) ×2 IMPLANT
EVACUATOR MICROVAS BLADDER (UROLOGICAL SUPPLIES) IMPLANT
GLOVE BIO SURGEON STRL SZ7 (GLOVE) IMPLANT
GLOVE INDICATOR 7.5 STRL GRN (GLOVE) ×3 IMPLANT
GLOVE SURG SS PI 7.5 STRL IVOR (GLOVE) ×6 IMPLANT
GOWN PREVENTION PLUS LG XLONG (DISPOSABLE) IMPLANT
GOWN STRL REIN XL XLG (GOWN DISPOSABLE) IMPLANT
GOWN STRL REUS W/TWL XL LVL3 (GOWN DISPOSABLE) ×6 IMPLANT
GUIDEWIRE 0.038 PTFE COATED (WIRE) IMPLANT
GUIDEWIRE ANG ZIPWIRE 038X150 (WIRE) IMPLANT
GUIDEWIRE STR DUAL SENSOR (WIRE) ×3 IMPLANT
HOLDER FOLEY CATH W/STRAP (MISCELLANEOUS) IMPLANT
NS IRRIG 500ML POUR BTL (IV SOLUTION) IMPLANT
PACK CYSTOSCOPY (CUSTOM PROCEDURE TRAY) ×3 IMPLANT
PLUG CATH AND CAP STER (CATHETERS) IMPLANT
SET ASPIRATION TUBING (TUBING) IMPLANT
SYR 30ML LL (SYRINGE) IMPLANT
SYRINGE IRR TOOMEY STRL 70CC (SYRINGE) IMPLANT
WATER STERILE IRR 3000ML UROMA (IV SOLUTION) ×3 IMPLANT

## 2014-05-30 NOTE — Discharge Instructions (Signed)
DISCHARGE INSTRUCTIONS FOR TRANSURETHRAL SURGERY OF BLADDER ° °MEDICATIONS:  ° °1. Resume all your other meds from home. ° ° ° °ACTIVITY °1. No heavy lifting >10 pounds for 2 weeks °2. No sexual activity for 2 weeks °3. No strenuous activity for 2 weeks °4. No driving while on narcotic pain medications °5. Drink plenty of water °6. Continue to walk at home - you can still get blood clots when you are at  °home, so keep active, but don't over do it. °7. Your urine may have some blood in it - make sure you drink plenty of water,  °call or come to the ER immediately if your catheter stops draining ° °FOLEY CATHETER  (If you go home with a catheter in place.) °1. Make sure your catheter is attached to your leg at all times - do not let  °anything pull on it °2. If the urine is your tube starts looking dark red or if it stops draining,  °call us immediately or come to the ER °3. Drink plenty of water, if you do notice your urine looking darking, sit down,  °relax and drink lots of water °4. You will be given a leg bag as well as an overnight bag for your catheter -  °MAKE SURE ATTACHED TO YOUR LEG AT ALL TIMES WITH TAPE OR LEG STRAP ° °BATHING °1. You can shower.  You may take a bath unless you have a Foley catheter in place. ° °SIGNS/SYMPTOMS TO CALL: °1. Please call us if you have a fever greater than 101.5, uncontrolled  °nausea/vomiting, uncontrolled pain, dizziness, unable to urinate, chest pain, shortness of breath, leg swelling, leg pain, or any other concerns  °or questions. ° °You can reach us at [phone]. ° ° °Post Anesthesia Home Care Instructions ° °Activity: °Get plenty of rest for the remainder of the day. A responsible adult should stay with you for 24 hours following the procedure.  °For the next 24 hours, DO NOT: °-Drive a car °-Operate machinery °-Drink alcoholic beverages °-Take any medication unless instructed by your physician °-Make any legal decisions or sign important papers. ° °Meals: °Start  with liquid foods such as gelatin or soup. Progress to regular foods as tolerated. Avoid greasy, spicy, heavy foods. If nausea and/or vomiting occur, drink only clear liquids until the nausea and/or vomiting subsides. Call your physician if vomiting continues. ° °Special Instructions/Symptoms: °Your throat may feel dry or sore from the anesthesia or the breathing tube placed in your throat during surgery. If this causes discomfort, gargle with warm salt water. The discomfort should disappear within 24 hours. ° °

## 2014-05-30 NOTE — Anesthesia Preprocedure Evaluation (Addendum)
Anesthesia Evaluation  Patient identified by MRN, date of birth, ID band Patient awake    Reviewed: Allergy & Precautions, H&P , NPO status , Patient's Chart, lab work & pertinent test results, reviewed documented beta blocker date and time   Airway Mallampati: II TM Distance: >3 FB Neck ROM: full    Dental no notable dental hx. (+) Teeth Intact, Dental Advisory Given   Pulmonary sleep apnea , former smoker,  breath sounds clear to auscultation  Pulmonary exam normal       Cardiovascular Exercise Tolerance: Good hypertension, Pt. on home beta blockers + CAD, + CABG and + Peripheral Vascular Disease Rhythm:regular Rate:Normal  Angioplasty 2006 CABG 2014  AAA   Neuro/Psych vertigo negative neurological ROS  negative psych ROS   GI/Hepatic Neg liver ROS, hiatal hernia, GERD-  Medicated and Controlled,  Endo/Other  negative endocrine ROS  Renal/GU negative Renal ROS     Musculoskeletal   Abdominal   Peds  Hematology negative hematology ROS (+)   Anesthesia Other Findings   Reproductive/Obstetrics negative OB ROS                           Anesthesia Physical  Anesthesia Plan  ASA: III  Anesthesia Plan: General   Post-op Pain Management:    Induction: Intravenous  Airway Management Planned: LMA  Additional Equipment:   Intra-op Plan:   Post-operative Plan:   Informed Consent: I have reviewed the patients History and Physical, chart, labs and discussed the procedure including the risks, benefits and alternatives for the proposed anesthesia with the patient or authorized representative who has indicated his/her understanding and acceptance.   Dental advisory given  Plan Discussed with: CRNA  Anesthesia Plan Comments:        Anesthesia Quick Evaluation

## 2014-05-30 NOTE — Anesthesia Procedure Notes (Signed)
Procedure Name: LMA Insertion Date/Time: 05/30/2014 2:02 PM Performed by: Bethena Roys T Pre-anesthesia Checklist: Patient identified, Emergency Drugs available, Suction available and Patient being monitored Patient Re-evaluated:Patient Re-evaluated prior to inductionOxygen Delivery Method: Circle System Utilized Preoxygenation: Pre-oxygenation with 100% oxygen Intubation Type: IV induction Ventilation: Mask ventilation without difficulty LMA: LMA inserted LMA Size: 5.0 Number of attempts: 1 Airway Equipment and Method: bite block Placement Confirmation: positive ETCO2 Dental Injury: Teeth and Oropharynx as per pre-operative assessment

## 2014-05-30 NOTE — Op Note (Signed)
Urology Operative Report  Date of Procedure: 05/30/14  Surgeon: Rolan Bucco, MD Assistant:  None  Preoperative Diagnosis: Urethral stricture. Postoperative Diagnosis: Bladder neck contracture.  Procedure(s): Dilation of bladder neck with dilating balloon. Incision of bladder neck contracture.  Estimated blood loss: Minimal  Specimen: None  Drains: 20Fr catheter.  Complications: None  Findings: Negative recurrent bladder tumors. Bladder neck contracture. Stenosis of distal urethra.  History of present illness: 51 room a history of prostate cancer was initially treated with prostatectomy and have biochemical recurrence treated with salvage radiation. He is developed a bladder neck contracture. This appeared to be a urethral stricture on office cystoscopy. 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 His found to have recurrent scar tissue in his urethra following balloon dilation and fibular 2015. He presents today for dilation and possible direct visual internal urethrotomy.   Procedure in detail: After informed consent was obtained, the patient was taken to the operating room. They were placed in the supine position. SCDs were turned on and in place. IV antibiotics were infused, and general anesthesia was induced. A timeout was performed in which the correct patient, surgical site, and procedure were identified and agreed upon by the team.  The patient was placed in a dorsolithotomy position, making sure to pad all pertinent neurovascular pressure points. A belladonna and opium suppository was placed into the rectum. The genitals were prepped and draped in the usual sterile fashion.  A rigid cystoscope was advanced through the urethra and into the bladder. There was noted to be some stenosis in the distal urethra which would barely accommodate the 22.5 [place] scope. The scope was navigated to the bulbar/membranous area of urethra where  there was noted to be narrowing. I could not navigate the scope beyond this area. Because I could not get an idea of where the sphincter was possibly located I elected to dilate with a balloon. I placed a sensor wire through the strictured area it and the wire curled back similar to what we would see with the bladder. The scope was backloaded and a NephroMax nephrostomy balloon was placed over the wire to the bladder neck under fluoroscopy and inflated with Omnipaque under fluoroscopy to 8 atmospheres and held for 3 minutes. This was then deflated and removed. The safety wire remained place. I was then able to navigate the cystoscope through this area and it appeared more that he had a bladder neck contracture rather than a membranous urethral stricture.   The bladder was fully distended and evaluated in a systematic fashion. There were no tumors. Both ureter orifices were in an orthotopic position and effluxing clear low urine.  I withdrew the scope into the urethra to visualize the area of scar tissue which looked more like a bladder neck contracture. I elected to incise the bladder neck contracture with a cold knife, but the distal urethra would not accommodate the 26 [place] scope. I calibrated the distal urethra up to 30 [place] with [employer] sounds, but it still would not accept the 26 [place] resectoscope. Therefore I placed a cold knife through the 22 French cystoscope sheath. I incised the bladder neck at the 6:00 position. There was very little bleeding from this area and therefore I did not use any electrocautery to avoid further scar tissue. Incision at this area freed up mobility of the scope through the bladder neck contracture. I then withdrew the scope. I placed 10 cc of lidocaine jelly through the urethra and  then placed a 20 [place] council-tip catheter over the wire all the way to the hub. This was inflated with 10 cc of sterile water. The safety wire was removed.  This completed seizure, the  patient was placed back in supine position, anesthesia was reversed, and he was taken to the PACU in stable condition.  All counts were correct at the end of the case.  He will be given ciprofloxacin to start the day before voiding trial in clinic.

## 2014-05-30 NOTE — Transfer of Care (Signed)
Immediate Anesthesia Transfer of Care Note  Patient: [name]  Procedure(s) Performed: Procedure(s): CYSTOSCOPY  BALLOON DILATION AND DIRECT VISION INTERNAL URETHROTOMY (N/A)  Patient Location: PACU  Anesthesia Type:General  Level of Consciousness: sedated and responds to stimulation  Airway & Oxygen Therapy: Patient Spontanous Breathing and Patient connected to nasal cannula oxygen  Post-op Assessment: Report given to PACU RN  Post vital signs: Reviewed and stable  Complications: No apparent anesthesia complications

## 2014-05-30 NOTE — Anesthesia Postprocedure Evaluation (Signed)
Anesthesia Post Note  Patient: [name]  Procedure(s) Performed: Procedure(s) (LRB): CYSTOSCOPY  BALLOON DILATION AND DIRECT VISION INTERNAL URETHROTOMY (N/A)  Anesthesia type: General  Patient location: PACU  Post pain: Pain level controlled  Post assessment: Post-op Vital signs reviewed  Last Vitals: BP 135/64  Pulse 63  Temp(Src) 36.3 C (Oral)  Resp 14  Wt 148 lb (67.132 kg)  SpO2 100%  Post vital signs: Reviewed  Level of consciousness: sedated  Complications: No apparent anesthesia complications

## 2014-05-30 NOTE — H&P (Signed)
Urology History and Physical Exam  CC: Urethral stricture. Bladder tumor.  HPI:  78 year old male presents today with urethral strictureand history of bladder tumor (PUNLMP).  He also has a history of radical prostatectomy in 2006 followed by salvage radiation completed in May 2013 for biochemical recurrence. Urethral stricture was located in the bulbar urethra.  It was discovered during office cystoscopy.  He underwent balloon dilation February 2015.  Repeat cystoscopy in the office in June revealed recurrent stricture in the urethra. This was located in the bulbar urethra again.  It prevented access to the bladder and therefore he could also have a recurrent bladder tumor.  We reviewed management options along with risks, benefits, alternatives, and likelihood of achieving goals.  He initially elected to be referred to reconstructive urologist, but has subsequently changed his mind and presents today for cystoscopy, urethral dilation, possible direct visual internal ureterotomy, possible transurethral resection of bladder tumor.  He understands that he will need a catheter in place following the balloon dilation of the stricture. UA 05/12/14 was negative for signs of infection.  PMH: Past Medical History  Diagnosis Date  . History of radiation therapy 08/24/12-10/22/12    prostate 6600cGy/33 sessions--  for biochemical recurrence  . S/P CABG x 2     09/  2014  . Hyperlipidemia   . Benign positional vertigo   . Hypertension   . Coronary artery disease CARDIOLOGIST--  DR Acie Fredrickson  . GERD (gastroesophageal reflux disease)   . H/O hiatal hernia   . OA (osteoarthritis)   . S/P dilatation of esophageal stricture     1992  . History of anal fissures   . Urethral stricture   . SUI (stress urinary incontinence), male   . Recurrent prostate adenocarcinoma first dx 09/2005--  radial prostatectomy-- gleason 4+3=7, pT2c, negative margin    04/2012--  BIOCHEMICAL  RECURRENCE to 0.33 /  SALVAGE IMRT SEPT  to  NOV 2013  . ED (erectile dysfunction)   . History of colon polyps   . Sigmoid diverticulosis   . Bladder cancer   . At risk for sleep apnea     STOP-BANG= 6    SENT TO PCP 05-25-2014  . Wears glasses     PSH: Past Surgical History  Procedure Laterality Date  . Cystoscopy with urethral dilatation N/A 01/26/2014    Procedure: CYSTOSCOPY WITH URETHRAL DILATATION, BLADDER BIOPSY, FOLEY CATHETER;  Surgeon: Molli Hazard, MD;  Location: [facility];  Service: Urology;  Laterality: N/A;  . Knee arthroscopy Right   . Lumbar disc surgery  1980'S  . Cardiovascular stress test  08-18-2013  DR NASHER    SMALL/ MEDIUM AREA MILD SCAR INFEROSEPTAL WALL/ NO ISCHEMIA/  LOW RISK SCAN/  EF 62%/  LV WALL MOTION WITH SLIGHT DECREASED OF THE SEPTUM  . Coronary angioplasty  1991    PTCA  of LEFT CFX  . Cardiac catheterization  08-27-2013  DR Martinique    CRITICAL OSTIAL LM STENOSIS/  diffuse disease LAD 40-50%  &   pLCx 30-40%/  . Coronary artery bypass graft N/A 08/27/2013    Procedure: CORONARY ARTERY BYPASS GRAFTING (CABG) times two using left internal mammary and right saphenous vein using endoscope.;  Surgeon: Melrose Nakayama, MD;  Location: [facility];  Service: Open Heart Surgery;  Laterality: N/A;  . Inguinal hernia repair Bilateral 1994  . Radical retropubic prostatectomy w/ bilateral pelvic node dissection  11-18-2005    Allergies: Allergies  Allergen Reactions  . Bee Venom Other (See Comments)  Reaction unknown  . Amoxicillin Rash  . Hydrocodone Other (See Comments)    REACTION: disorientation    Medications: No prescriptions prior to admission     Social History: History   Social History  . Marital Status: Married    Spouse Name: N/A    Number of Children: 2  . Years of Education: N/A   Occupational History  . Retired    Social History Main Topics  . Smoking status: Former Smoker -- 2.00 packs/day for 10 years    Types: Cigarettes    Quit date: 12/03/1994  .  Smokeless tobacco: Never Used  . Alcohol Use: Yes     Comment: occasionally  . Drug Use: No  . Sexual Activity: Not on file   Other Topics Concern  . Not on file   Social History Narrative  . No narrative on file    Family History: Family History  Problem Relation Age of Onset  . Cancer Father     prostate died 78 something  . Arthritis Other   . Hyperlipidemia Other   . Hypertension Other   . Cancer Brother     prostate cancer    Review of Systems: Positive: None. Negative: Fever, SOB, or chest pain.  A further 10 point review of systems was negative except what is listed in the HPI.  Physical Exam: Filed Vitals:   05/30/14 1311  BP: 145/73  Pulse: 64  Temp: 97.1 F (36.2 C)  Resp: 14    General: No acute distress.  Awake. Head:  Normocephalic.  Atraumatic. ENT:  EOMI.  Mucous membranes moist Neck:  Supple.  No lymphadenopathy. CV:  S1 present. S2 present. Regular rate. Pulmonary: Equal effort bilaterally.  Clear to auscultation bilaterally. Abdomen: Soft.  Non- tender to palpation. Skin:  Normal turgor.  No visible rash. Extremity: No gross deformity of bilateral upper extremities.  No gross deformity of    bilateral lower extremities. Neurologic: Alert. Appropriate mood.    Studies:  No results found for this basename: HGB, WBC, PLT,  in the last 72 hours  No results found for this basename: NA, K, CL, CO2, BUN, CREATININE, CALCIUM, MAGNESIUM, GFRNONAA, GFRAA,  in the last 72 hours   No results found for this basename: PT, INR, APTT,  in the last 72 hours   No components found with this basename: ABG,     Assessment:  Urethral stricture. Bladder tumor.  Plan: To OR for cystoscopy, urethral dilation, possible direct visual internal ureterotomy, possible transurethral resection of bladder tumor.

## 2014-05-31 ENCOUNTER — Encounter (HOSPITAL_BASED_OUTPATIENT_CLINIC_OR_DEPARTMENT_OTHER): Payer: Self-pay | Admitting: Urology

## 2014-05-31 LAB — POCT I-STAT, CHEM 8
BUN: 24 mg/dL — ABNORMAL HIGH (ref 6–23)
Calcium, Ion: 1.23 mmol/L (ref 1.13–1.30)
Chloride: 104 mEq/L (ref 96–112)
Creatinine, Ser: 1.4 mg/dL — ABNORMAL HIGH (ref 0.50–1.35)
Glucose, Bld: 82 mg/dL (ref 70–99)
HCT: 46 % (ref 39.0–52.0)
Hemoglobin: 15.6 g/dL (ref 13.0–17.0)
Potassium: 4.4 mEq/L (ref 3.7–5.3)
Sodium: 142 mEq/L (ref 137–147)
TCO2: 26 mmol/L (ref 0–100)

## 2014-06-09 DIAGNOSIS — D303 Benign neoplasm of bladder: Secondary | ICD-10-CM | POA: Diagnosis not present

## 2014-06-09 DIAGNOSIS — R82998 Other abnormal findings in urine: Secondary | ICD-10-CM | POA: Diagnosis not present

## 2014-06-09 DIAGNOSIS — N320 Bladder-neck obstruction: Secondary | ICD-10-CM | POA: Diagnosis not present

## 2014-06-16 DIAGNOSIS — N320 Bladder-neck obstruction: Secondary | ICD-10-CM | POA: Diagnosis not present

## 2014-06-18 ENCOUNTER — Other Ambulatory Visit: Payer: Self-pay | Admitting: Cardiovascular Disease

## 2014-07-07 DIAGNOSIS — N35919 Unspecified urethral stricture, male, unspecified site: Secondary | ICD-10-CM | POA: Diagnosis not present

## 2014-07-07 DIAGNOSIS — R300 Dysuria: Secondary | ICD-10-CM | POA: Diagnosis not present

## 2014-08-04 ENCOUNTER — Ambulatory Visit (INDEPENDENT_AMBULATORY_CARE_PROVIDER_SITE_OTHER): Payer: Medicare Other | Admitting: Family Medicine

## 2014-08-04 ENCOUNTER — Encounter: Payer: Self-pay | Admitting: Family Medicine

## 2014-08-04 VITALS — BP 136/72 | HR 68 | Temp 97.9°F | Ht 68.0 in | Wt 148.0 lb

## 2014-08-04 DIAGNOSIS — E538 Deficiency of other specified B group vitamins: Secondary | ICD-10-CM | POA: Diagnosis not present

## 2014-08-04 DIAGNOSIS — I714 Abdominal aortic aneurysm, without rupture, unspecified: Secondary | ICD-10-CM | POA: Diagnosis not present

## 2014-08-04 DIAGNOSIS — E785 Hyperlipidemia, unspecified: Secondary | ICD-10-CM

## 2014-08-04 DIAGNOSIS — Z0000 Encounter for general adult medical examination without abnormal findings: Secondary | ICD-10-CM | POA: Diagnosis not present

## 2014-08-04 DIAGNOSIS — Z23 Encounter for immunization: Secondary | ICD-10-CM | POA: Diagnosis not present

## 2014-08-04 DIAGNOSIS — I10 Essential (primary) hypertension: Secondary | ICD-10-CM

## 2014-08-04 DIAGNOSIS — I2510 Atherosclerotic heart disease of native coronary artery without angina pectoris: Secondary | ICD-10-CM | POA: Diagnosis not present

## 2014-08-04 LAB — BASIC METABOLIC PANEL
BUN: 21 mg/dL (ref 6–23)
CO2: 25 mEq/L (ref 19–32)
Calcium: 9.8 mg/dL (ref 8.4–10.5)
Chloride: 106 mEq/L (ref 96–112)
Creatinine, Ser: 1.4 mg/dL (ref 0.4–1.5)
GFR: 51.03 mL/min — ABNORMAL LOW (ref >60.00)
Glucose, Bld: 91 mg/dL (ref 70–99)
Potassium: 5.4 mEq/L — ABNORMAL HIGH (ref 3.5–5.1)
Sodium: 141 mEq/L (ref 135–145)

## 2014-08-04 LAB — VITAMIN B12: Vitamin B-12: 536 pg/mL (ref 211–911)

## 2014-08-04 MED ORDER — PANTOPRAZOLE SODIUM 40 MG PO TBEC: 40.0000 mg | DELAYED_RELEASE_TABLET | ORAL | Status: DC

## 2014-08-04 NOTE — Progress Notes (Signed)
Pre visit review using our clinic review tool, if applicable. No additional management support is needed unless otherwise documented below in the visit note. 

## 2014-08-04 NOTE — Progress Notes (Signed)
Subjective:    Patient ID: [name], male    DOB: 10/24/1936, 78 y.o.   MRN: [id number]  HPI Patient here for Medicare wellness exam and medical followup. He was seen last year with exertional angina. He was referred to cardiology. Had nuclear stress test which was unrevealing. Continued to have symptoms and underwent cardiac catheterization with left main disease. Bypass surgery and has done well since then. He has had some slightly diminished appetite since then and lost some weight but weight recently has been stable. he went through full cardiac rehabilitation program.  Other medical problems include history of GERD, hyperlipidemia, hypertension, osteoarthritis, prostate cancer, B12 deficiency.  . Takes over-the-counter B12 replacement. No recent chest pains. Compliant with medications. Needs Prevnar 13 and flu vaccinations.  He has urethral stricture related to radiation therapy for prostate cancer still some ongoing dysuria symptoms. He is still followed by urology.  Past Medical History  Diagnosis Date  . History of radiation therapy 08/24/12-10/22/12    prostate 6600cGy/33 sessions--  for biochemical recurrence  . S/P CABG x 2     09/  2014  . Hyperlipidemia   . Benign positional vertigo   . Hypertension   . Coronary artery disease CARDIOLOGIST--  DR Acie Fredrickson  . GERD (gastroesophageal reflux disease)   . H/O hiatal hernia   . OA (osteoarthritis)   . S/P dilatation of esophageal stricture     1992  . History of anal fissures   . Urethral stricture   . SUI (stress urinary incontinence), male   . Recurrent prostate adenocarcinoma first dx 09/2005--  radial prostatectomy-- gleason 4+3=7, pT2c, negative margin    04/2012--  BIOCHEMICAL  RECURRENCE to 0.33 /  SALVAGE IMRT SEPT to  NOV 2013  . ED (erectile dysfunction)   . History of colon polyps   . Sigmoid diverticulosis   . Bladder cancer   . At risk for sleep apnea     STOP-BANG= 6    SENT TO PCP 05-25-2014  . Wears  glasses    Past Surgical History  Procedure Laterality Date  . Cystoscopy with urethral dilatation N/A 01/26/2014    Procedure: CYSTOSCOPY WITH URETHRAL DILATATION, BLADDER BIOPSY, FOLEY CATHETER;  Surgeon: Molli Hazard, MD;  Location: [facility];  Service: Urology;  Laterality: N/A;  . Knee arthroscopy Right   . Lumbar disc surgery  1980'S  . Cardiovascular stress test  08-18-2013  DR NASHER    SMALL/ MEDIUM AREA MILD SCAR INFEROSEPTAL WALL/ NO ISCHEMIA/  LOW RISK SCAN/  EF 62%/  LV WALL MOTION WITH SLIGHT DECREASED OF THE SEPTUM  . Coronary angioplasty  1991    PTCA  of LEFT CFX  . Cardiac catheterization  08-27-2013  DR [place]    CRITICAL OSTIAL LM STENOSIS/  diffuse disease LAD 40-50%  &   pLCx 30-40%/  . Coronary artery bypass graft N/A 08/27/2013    Procedure: CORONARY ARTERY BYPASS GRAFTING (CABG) times two using left internal mammary and right saphenous vein using endoscope.;  Surgeon: Melrose Nakayama, MD;  Location: [facility];  Service: Open Heart Surgery;  Laterality: N/A;  . Inguinal hernia repair Bilateral 1994  . Radical retropubic prostatectomy w/ bilateral pelvic node dissection  11-18-2005  . Urethrotomy N/A 05/30/2014    Procedure: CYSTOSCOPY  BALLOON DILATION AND DIRECT VISION INTERNAL URETHROTOMY;  Surgeon: Sharyn Creamer, MD;  Location: [facility];  Service: Urology;  Laterality: N/A;    reports that he quit smoking about 19  years ago. His smoking use included Cigarettes. He has a 20 pack-year smoking history. He has never used smokeless tobacco. He reports that he drinks alcohol. He reports that he does not use illicit drugs. family history includes Arthritis in his other; Cancer in his brother and father; Hyperlipidemia in his other; Hypertension in his other. Allergies  Allergen Reactions  . Bee Venom Other (See Comments)    Reaction unknown  . Amoxicillin Rash  . Hydrocodone Other (See Comments)    REACTION: disorientation   1.  Risk  factors based on Past Medical , Social, and Family history reviewed and as indicated above with no changes 2.  Limitations in physical activities None.  No recent falls. 3.  Depression/mood No active depression or anxiety issues 4.  Hearing No defiits 5.  ADLs independent in all. 6.  Cognitive function (orientation to time and place, language, writing, speech,memory) no short or long term memory issues.  Language and judgement intact. 7.  Home Safety no issues 8.  Height, weight, and visual acuity. Weight loss since last year but recently stable. 9.  Counseling discussed discussed healthy lifestyle habits. 10. Recommendation of preventive services. Prevnar 13 and flu vaccine 11. Labs based on risk factors:  basic metabolic panel and K53 level 12. Care Plan yearly flu vaccine. Tetanus every 10 years. Colonoscopy every 10 years. He will need yearly abdominal ultrasound to reassess abdominal aortic aneurysm 13. Other Providers Dr Acie Fredrickson -Cardiology, Dr Herrick-Urology 14. Written schedule of screening/prevention services given to patient.    Review of Systems  Constitutional: Positive for appetite change and fatigue. Negative for fever, activity change and unexpected weight change.  HENT: Negative for congestion, ear pain and trouble swallowing.   Eyes: Negative for pain and visual disturbance.  Respiratory: Negative for cough, shortness of breath and wheezing.   Cardiovascular: Negative for chest pain and palpitations.  Gastrointestinal: Negative for nausea, vomiting, abdominal pain, diarrhea, constipation, blood in stool, abdominal distention and rectal pain.  Endocrine: Negative for polydipsia and polyuria.  Genitourinary: Positive for frequency. Negative for dysuria, hematuria and testicular pain.  Musculoskeletal: Negative for arthralgias and joint swelling.  Skin: Negative for rash.  Neurological: Negative for dizziness, syncope and headaches.  Hematological: Negative for adenopathy.    Psychiatric/Behavioral: Negative for confusion and dysphoric mood.       Objective:   Physical Exam  Constitutional: He is oriented to person, place, and time. He appears well-developed and well-nourished. No distress.  HENT:  Head: Normocephalic and atraumatic.  Right Ear: External ear normal.  Left Ear: External ear normal.  Mouth/Throat: Oropharynx is clear and moist.  Eyes: Conjunctivae and EOM are normal. Pupils are equal, round, and reactive to light.  Neck: Normal range of motion. Neck supple. No thyromegaly present.  Cardiovascular: Normal rate, regular rhythm and normal heart sounds.   No murmur heard. Pulmonary/Chest: No respiratory distress. He has no wheezes. He has no rales.  Abdominal: Soft. Bowel sounds are normal. He exhibits no distension and no mass. There is no tenderness. There is no rebound and no guarding.  Musculoskeletal: He exhibits no edema.  Lymphadenopathy:    He has no cervical adenopathy.  Neurological: He is alert and oriented to person, place, and time. He displays normal reflexes. No cranial nerve deficit.  Skin: No rash noted.  Psychiatric: He has a normal mood and affect.          Assessment & Plan:  #1 health maintenance. Flu vaccine given. Prevnar 13 given. #2 abdominal  aortic aneurysm by [facility] screening back in July 2013. Repeat abdominal aortic aneurysm screen. #3 hypertension which is stable #4 hyperlipidemia. Recent lipids through cardiology in June stable #5 history of CAD with bypass last September. He has done fairly well since then. #6 history of prostate cancer-followed by urology. #7 chronic kidney disease. Recent labs revealed creatinine 1.6 which was slightly worse. Recheck basic metabolic panel #8 L87 deficiency. Repeat levels

## 2014-08-04 NOTE — Patient Instructions (Signed)
Will call you regarding repeat abdominal aorta screen to followup previously noted abdominal aortic aneurysm Continue with yearly flu vaccine Your tetanus is up to date. Colonoscopy is up to date. Continue daily oral B12 replacement

## 2014-08-15 ENCOUNTER — Ambulatory Visit
Admission: RE | Admit: 2014-08-15 | Discharge: 2014-08-15 | Disposition: A | Discharge status: Home / Self Care | Payer: Medicare Other | Source: Ambulatory Visit | Attending: Family Medicine | Admitting: Family Medicine

## 2014-08-15 ENCOUNTER — Other Ambulatory Visit: Payer: Self-pay | Admitting: Family Medicine

## 2014-08-15 DIAGNOSIS — I714 Abdominal aortic aneurysm, without rupture, unspecified: Secondary | ICD-10-CM | POA: Diagnosis not present

## 2014-08-18 ENCOUNTER — Other Ambulatory Visit: Payer: Self-pay | Admitting: Cardiovascular Disease

## 2014-08-30 IMAGING — CR DG HIP COMPLETE 2+V*R*
3 series · 3 of 3 positions shown · non-contrast
Comparison: None.

CLINICAL DATA: History of right hip pain.

RIGHT HIP - COMPLETE 2+ VIEW

[view not recorded (1 of 3)]
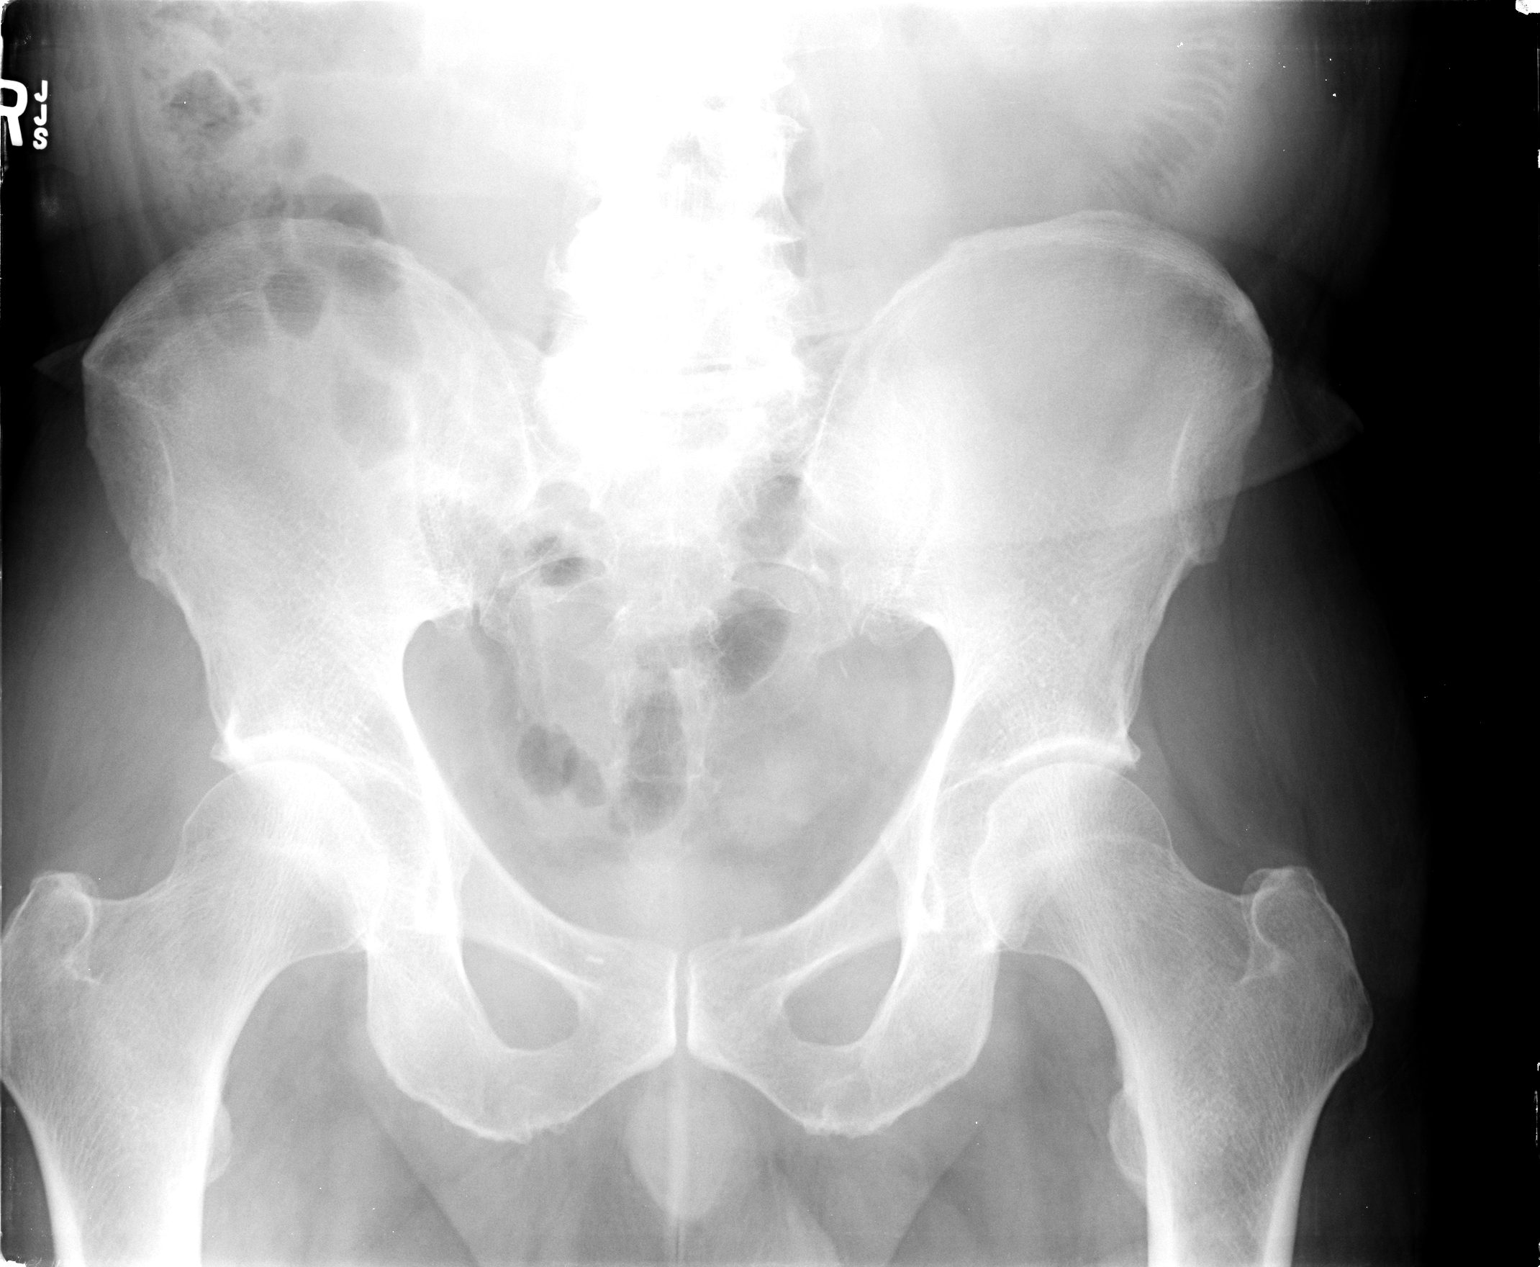

[view not recorded (2 of 3)]
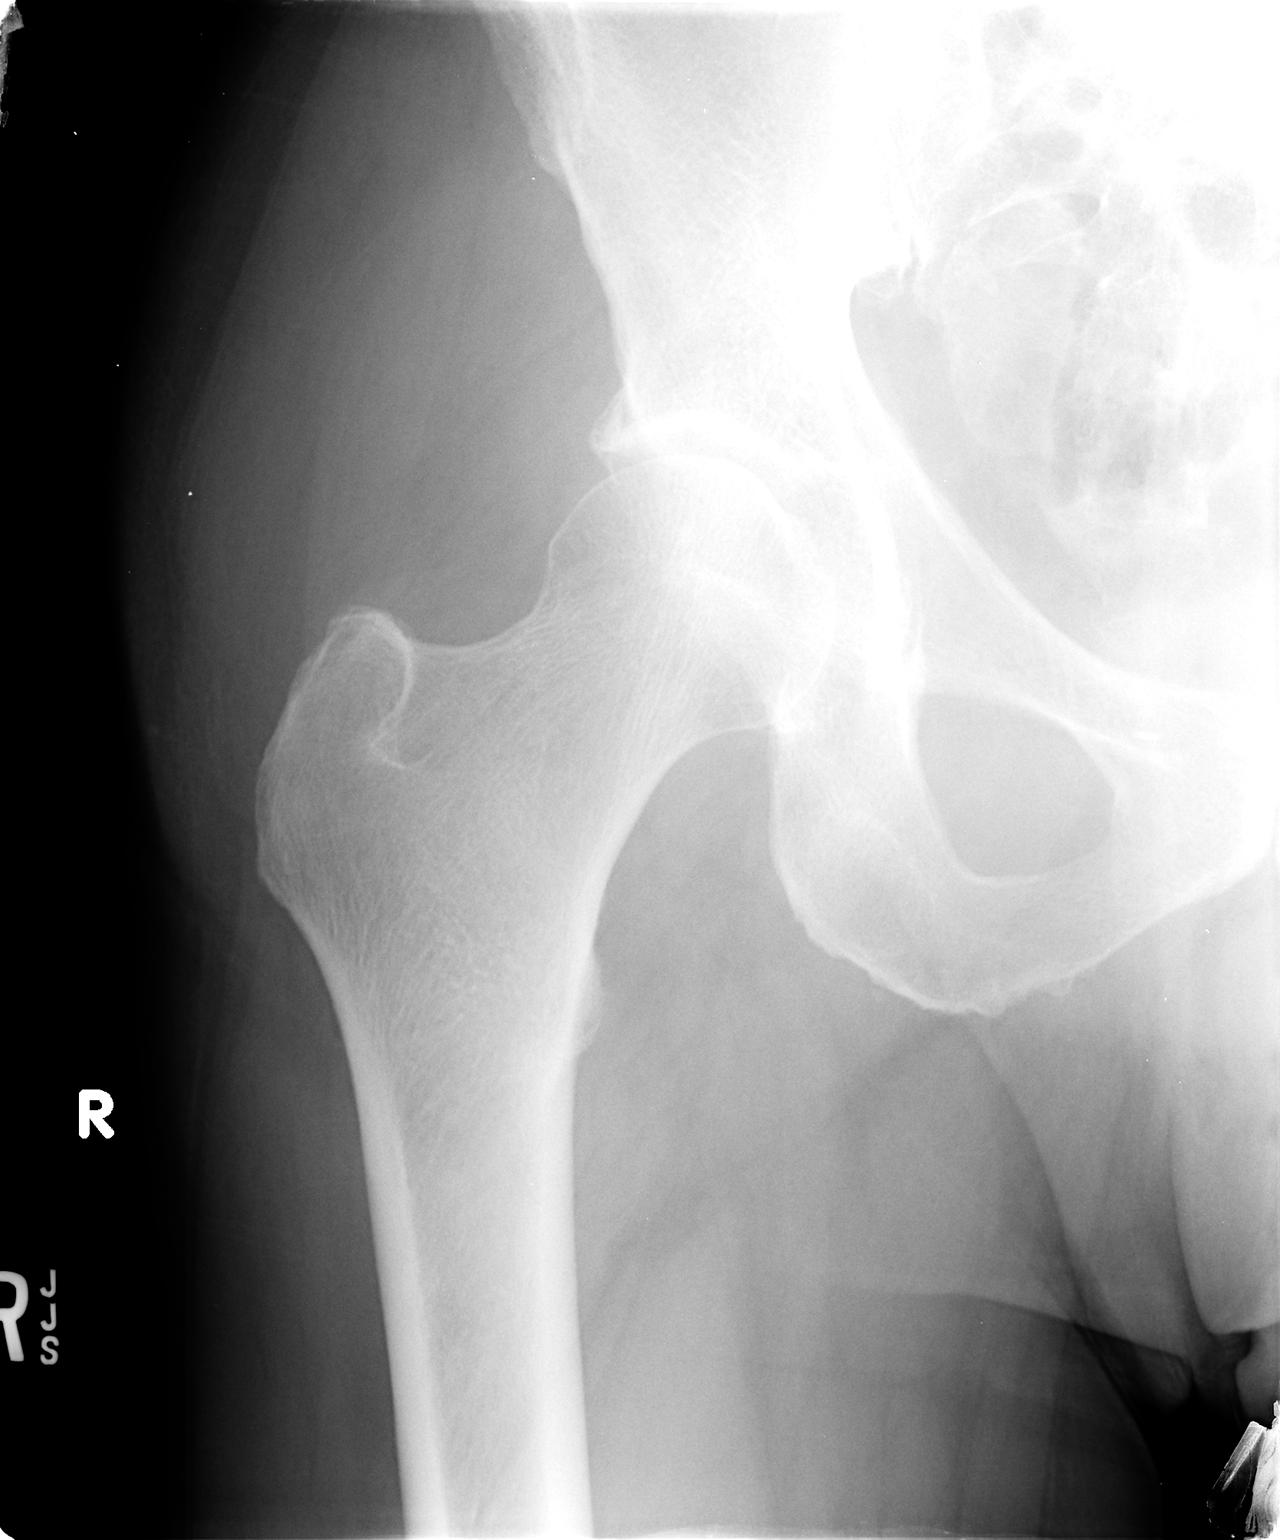

[view not recorded (3 of 3)]
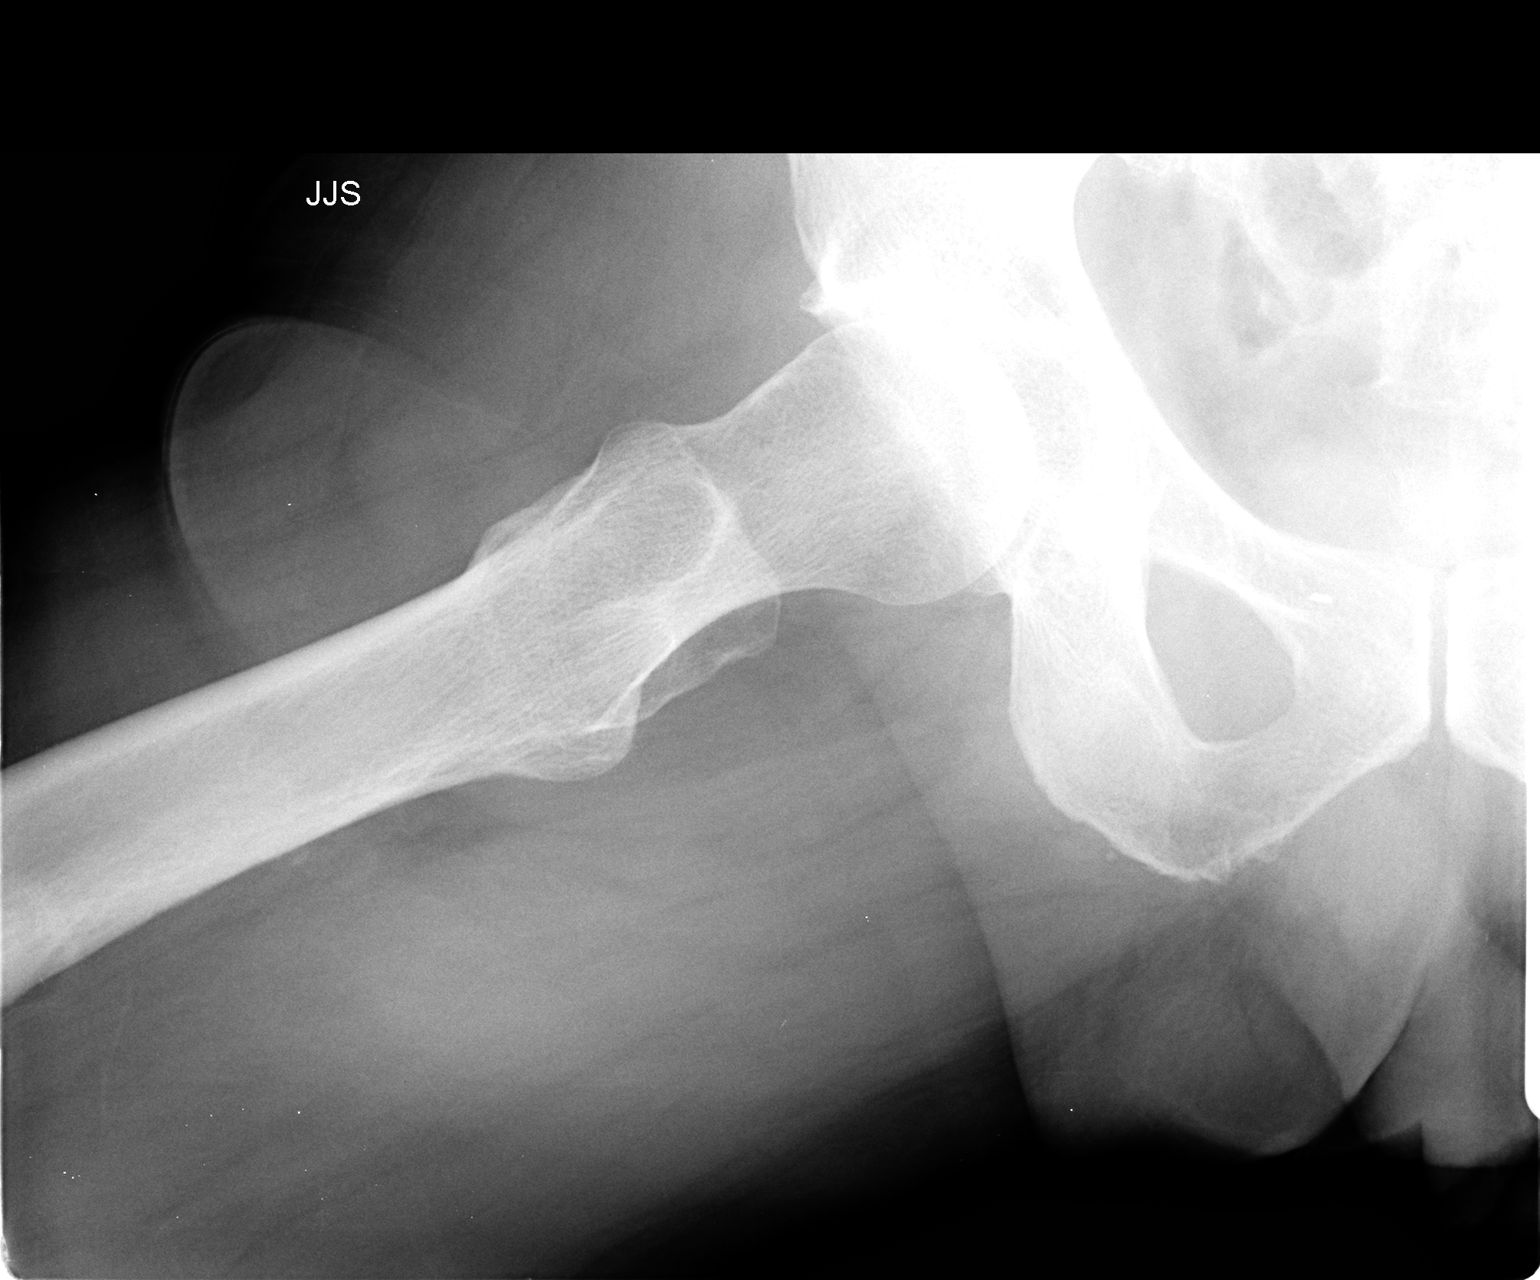

[3 of 3 positions shown; findings below may reference images not displayed]

FINDINGS: There is degenerative spondylosis. SI joints appear
intact.  Bony pelvis appears intact.  Hip joint spaces are
preserved.  They are symmetrical.  No fracture, bony destruction,
or calcific bursitis is evident.  No significant spurring is
evident.
IMPRESSION: No lesion is identified.  Degenerative spondylosis.

## 2014-11-10 ENCOUNTER — Encounter (HOSPITAL_COMMUNITY): Payer: Self-pay | Admitting: Cardiology

## 2014-12-05 ENCOUNTER — Encounter: Payer: Self-pay | Admitting: Cardiovascular Disease

## 2014-12-05 ENCOUNTER — Ambulatory Visit (INDEPENDENT_AMBULATORY_CARE_PROVIDER_SITE_OTHER): Payer: Medicare Other | Admitting: Cardiovascular Disease

## 2014-12-05 VITALS — BP 136/74 | HR 63 | Ht 68.0 in | Wt 149.8 lb

## 2014-12-05 DIAGNOSIS — I25810 Atherosclerosis of coronary artery bypass graft(s) without angina pectoris: Secondary | ICD-10-CM | POA: Diagnosis not present

## 2014-12-05 DIAGNOSIS — I10 Essential (primary) hypertension: Secondary | ICD-10-CM

## 2014-12-05 DIAGNOSIS — I2510 Atherosclerotic heart disease of native coronary artery without angina pectoris: Secondary | ICD-10-CM | POA: Diagnosis not present

## 2014-12-05 DIAGNOSIS — E785 Hyperlipidemia, unspecified: Secondary | ICD-10-CM | POA: Diagnosis not present

## 2014-12-05 LAB — BASIC METABOLIC PANEL
BUN: 23 mg/dL (ref 6–23)
CO2: 27 mEq/L (ref 19–32)
Calcium: 9.4 mg/dL (ref 8.4–10.5)
Chloride: 105 mEq/L (ref 96–112)
Creatinine, Ser: 1.5 mg/dL (ref 0.4–1.5)
GFR: 48.25 mL/min — ABNORMAL LOW (ref >60.00)
Glucose, Bld: 92 mg/dL (ref 70–99)
Potassium: 4.5 mEq/L (ref 3.5–5.1)
Sodium: 140 mEq/L (ref 135–145)

## 2014-12-05 LAB — LIPID PANEL
Cholesterol: 137 mg/dL (ref 0–200)
HDL: 40.8 mg/dL (ref >39.00)
LDL Cholesterol: 72 mg/dL (ref 0–99)
NonHDL: 96.2
Total CHOL/HDL Ratio: 3
Triglycerides: 123 mg/dL (ref 0.0–149.0)
VLDL: 24.6 mg/dL (ref 0.0–40.0)

## 2014-12-05 LAB — HEPATIC FUNCTION PANEL
ALT: 23 U/L (ref 0–53)
AST: 23 U/L (ref 0–37)
Albumin: 4.2 g/dL (ref 3.5–5.2)
Alkaline Phosphatase: 57 U/L (ref 39–117)
Bilirubin, Direct: 0.1 mg/dL (ref 0.0–0.3)
Total Bilirubin: 0.9 mg/dL (ref 0.2–1.2)
Total Protein: 7 g/dL (ref 6.0–8.3)

## 2014-12-05 NOTE — Assessment & Plan Note (Signed)
Check lipids today Continue med

## 2014-12-05 NOTE — Patient Instructions (Signed)
Your physician recommends that you continue on your current medications as directed. Please refer to the Current Medication list given to you today.  Your physician recommends that you have lab work:  TODAY - fasting cholesterol/liver/basic metabolic panel  Your physician wants you to follow-up in: 6 months with Dr. Acie Fredrickson.  You will receive a reminder letter in the mail two months in advance. If you don't receive a letter, please call our office to schedule the follow-up appointment.

## 2014-12-05 NOTE — Progress Notes (Signed)
[name] Date of Birth  19-Jun-1936 [place] [address]    [place]   [place], [place]  [address]    [place], [place]  [address] [phone]  Fax  [phone]  [phone]  Fax [phone]  Problem list: 1. Coronary artery disease- status post remote PTCA to the left complex artery in 1991, CABG 2014 2. Hypertension 3. Hyperlipidemia 4. Prostate Cancer.  History of Present Illness:  [name] is  a 79 y.o. -year-old gentleman with a history of PTCA approximately 22 years ago.  He's not had any episodes of chest pain or shortness of breath. He stays fairly active although he doesn't exercise at the gym.  He has some problems with some arthritis-type pain in his right hip.  The Lipitor causes a little bit of muscular skeletal pain.  May 10, 2013:  He stopped hib Benicar 2 months ago. His BP has been running low at home.  He has been on a new medicine for his prostate and was concerned that his BP would run too low.   He has been taking a varied dose of benicar - 1 tab, 1/2 tab, 1/4 tablet whenever he thinks he needs it.   Sept. 5, 2014:  [name] presents today for worsening shortness of breath and chest pain.  Last Saturday, he mowed the lawn and spread 200 lbs of lime.  He developed severe CP and started to take some NTG.  The pain finally eased up. Several days later, he had recurrent CP while cleaning out the gutters.    He thinks he was just overheated.   He has been recording his BP and has been getting some low readings.     He has been having some hip pain and does not know if he will be able to walk on the treadmill.   Sept. 24, 2014: [name] was seen recently with some worsening chest pain. He had a stress Myoview study which was interpreted as low risk he has a fixed inferoapical defect.  He exertion, he is still having angina.  Lasts 5 minutes. In the center of his chest. No radiatioin.  Occurs only with  exertion ( stating mower or Rototiller- never walking around the house.)   Class II - III angina.    Yesterday, he had some mild baseline CP which worsened while walking around the grocery store.   Dec. 16, 2014:  [name] has had CABG since I last saw him in the office.  Still has chest tenderness - breathing is good, no CP  Chest wall is still tender.  May 11, 2014:  [name] is doing ok.   Jan. 4, 2015:    Current Outpatient Prescriptions on File Prior to Visit  Medication Sig Dispense Refill  . atorvastatin (LIPITOR) 40 MG tablet Take 40 mg by mouth at bedtime.     . metoprolol succinate (TOPROL-XL) 25 MG 24 hr tablet TAKE 1 TABLET BY MOUTH DAILY 90 tablet 3  . Multiple Vitamin (MULTIVITAMIN WITH MINERALS) TABS tablet Take 1 tablet by mouth daily.    . nitroGLYCERIN (NITROSTAT) 0.4 MG SL tablet Place 1 tablet (0.4 mg total) under the tongue every 5 (five) minutes as needed for chest pain. 25 tablet PRN  . pantoprazole (PROTONIX) 40 MG tablet Take 1 tablet (40 mg total) by mouth every other day. Takes in am 90 tablet 3  . polyethylene glycol (MIRALAX / GLYCOLAX) packet Take 17  g by mouth daily as needed (for constipation).     [name] aspirin 81 MG EC tablet Take 1 tablet (81 mg total) by mouth daily. (Patient not taking: Reported on 12/05/2014)     No current facility-administered medications on file prior to visit.    Allergies  Allergen Reactions  . Bee Venom Other (See Comments)    Reaction unknown  . Amoxicillin Rash  . Hydrocodone Other (See Comments)    REACTION: disorientation    Past Medical History  Diagnosis Date  . History of radiation therapy 08/24/12-10/22/12    prostate 6600cGy/33 sessions--  for biochemical recurrence  . S/P CABG x 2     09/  2014  . Hyperlipidemia   . Benign positional vertigo   . Hypertension   . Coronary artery disease CARDIOLOGIST--  DR Acie Fredrickson  . GERD (gastroesophageal reflux disease)   . H/O hiatal hernia   . OA (osteoarthritis)   . S/P  dilatation of esophageal stricture     1992  . History of anal fissures   . Urethral stricture   . SUI (stress urinary incontinence), male   . Recurrent prostate adenocarcinoma first dx 09/2005--  radial prostatectomy-- gleason 4+3=7, pT2c, negative margin    04/2012--  BIOCHEMICAL  RECURRENCE to 0.33 /  SALVAGE IMRT SEPT to  NOV 2013  . ED (erectile dysfunction)   . History of colon polyps   . Sigmoid diverticulosis   . Bladder cancer   . At risk for sleep apnea     STOP-BANG= 6    SENT TO PCP 05-25-2014  . Wears glasses     Past Surgical History  Procedure Laterality Date  . Cystoscopy with urethral dilatation N/A 01/26/2014    Procedure: CYSTOSCOPY WITH URETHRAL DILATATION, BLADDER BIOPSY, FOLEY CATHETER;  Surgeon: Molli Hazard, MD;  Location: [facility];  Service: Urology;  Laterality: N/A;  . Knee arthroscopy Right   . Lumbar disc surgery  1980'S  . Cardiovascular stress test  08-18-2013  DR NASHER    SMALL/ MEDIUM AREA MILD SCAR INFEROSEPTAL WALL/ NO ISCHEMIA/  LOW RISK SCAN/  EF 62%/  LV WALL MOTION WITH SLIGHT DECREASED OF THE SEPTUM  . Coronary angioplasty  1991    PTCA  of LEFT CFX  . Cardiac catheterization  08-27-2013  DR Martinique    CRITICAL OSTIAL LM STENOSIS/  diffuse disease LAD 40-50%  &   pLCx 30-40%/  . Coronary artery bypass graft N/A 08/27/2013    Procedure: CORONARY ARTERY BYPASS GRAFTING (CABG) times two using left internal mammary and right saphenous vein using endoscope.;  Surgeon: Melrose Nakayama, MD;  Location: [facility];  Service: Open Heart Surgery;  Laterality: N/A;  . Inguinal hernia repair Bilateral 1994  . Radical retropubic prostatectomy w/ bilateral pelvic node dissection  11-18-2005  . Urethrotomy N/A 05/30/2014    Procedure: CYSTOSCOPY  BALLOON DILATION AND DIRECT VISION INTERNAL URETHROTOMY;  Surgeon: Sharyn Creamer, MD;  Location: [facility];  Service: Urology;  Laterality: N/A;  . Left heart catheterization with coronary  angiogram N/A 08/27/2013    Procedure: LEFT HEART CATHETERIZATION WITH CORONARY ANGIOGRAM;  Surgeon: Peter M Martinique, MD;  Location: [facility] CATH LAB;  Service: Cardiovascular;  Laterality: N/A;    History  Smoking status  . Former Smoker -- 2.00 packs/day for 10 years  . Types: Cigarettes  . Quit date: 12/03/1994  Smokeless tobacco  . Never Used    History  Alcohol Use  . Yes  Comment: occasionally    Family History  Problem Relation Age of Onset  . Cancer Father     prostate died 1 something  . Arthritis Other   . Hyperlipidemia Other   . Hypertension Other   . Cancer Brother     prostate cancer    Reviw of Systems:  Reviewed in the HPI.  All other systems are negative.  Physical Exam: Blood pressure 136/74, pulse 63, height 5\' 8"  (1.727 m), weight 149 lb 12.8 oz (67.949 kg). General: Well developed, well nourished, in no acute distress.  Head: Normocephalic, atraumatic, sclera non-icteric, mucus membranes are moist,   Neck: Supple. Negative for carotid bruits. JVD not elevated.  Lungs: Clear bilaterally to auscultation without wheezes, rales, or rhonchi. Breathing is unlabored.  Heart: RRR with S1 S2. No murmurs, rubs, or gallops appreciated.  Abdomen: Soft, non-tender, non-distended with normoactive bowel sounds. No hepatomegaly. No rebound/guarding. No obvious abdominal masses.  Msk:  Strength and tone appear normal for age.  Extremities: No clubbing or cyanosis. No edema.  Distal pedal pulses are 2+ and equal bilaterally.  Neuro: Alert and oriented X 3. Moves all extremities spontaneously.  Psych:  Responds to questions appropriately with a normal affect.  ECG: Jan. 4, 2016:  NSR at 63, normal   Assessment / Plan:

## 2014-12-05 NOTE — Assessment & Plan Note (Signed)
[name] has done well from a cardiac standpoint No angina  Check lipids, liver, BMP today.

## 2014-12-09 DIAGNOSIS — H26493 Other secondary cataract, bilateral: Secondary | ICD-10-CM | POA: Diagnosis not present

## 2015-01-02 DIAGNOSIS — Z8546 Personal history of malignant neoplasm of prostate: Secondary | ICD-10-CM | POA: Diagnosis not present

## 2015-01-09 DIAGNOSIS — N359 Urethral stricture, unspecified: Secondary | ICD-10-CM | POA: Diagnosis not present

## 2015-01-09 DIAGNOSIS — D414 Neoplasm of uncertain behavior of bladder: Secondary | ICD-10-CM | POA: Diagnosis not present

## 2015-01-09 DIAGNOSIS — C61 Malignant neoplasm of prostate: Secondary | ICD-10-CM | POA: Diagnosis not present

## 2015-01-24 ENCOUNTER — Encounter: Payer: Self-pay | Admitting: Family Medicine

## 2015-01-24 ENCOUNTER — Ambulatory Visit (INDEPENDENT_AMBULATORY_CARE_PROVIDER_SITE_OTHER): Payer: Medicare Other | Admitting: Family Medicine

## 2015-01-24 VITALS — BP 126/80 | HR 66 | Temp 97.9°F | Wt 150.0 lb

## 2015-01-24 DIAGNOSIS — F4321 Adjustment disorder with depressed mood: Secondary | ICD-10-CM | POA: Diagnosis not present

## 2015-01-24 DIAGNOSIS — I25810 Atherosclerosis of coronary artery bypass graft(s) without angina pectoris: Secondary | ICD-10-CM

## 2015-01-24 MED ORDER — SERTRALINE HCL 50 MG PO TABS: 50.0000 mg | ORAL_TABLET | Freq: Every day | ORAL | Status: DC

## 2015-01-24 NOTE — Progress Notes (Signed)
Subjective:    Patient ID: [name], male    DOB: [birth year], 79 y.o.   MRN: [id number]  HPI Patient seen for grief reaction. His wife of 70 years died about 5 months ago from COPD complications. Patient is currently getting counseling through Hospice. He's having some isues with decreased appetite- though his weight has been stable. Some sleep disturbance. Loss of interest in activities. Feelings of hopelessness. Frequent crying spells. No suicidal ideation. No past history of depression. He has 2 daughters who live nearby. He says in touch with them and they are very supportive.     Past Medical History  Diagnosis Date  . History of radiation therapy 08/24/12-10/22/12    prostate 6600cGy/33 sessions--  for biochemical recurrence  . S/P CABG x 2     09/  2014  . Hyperlipidemia   . Benign positional vertigo   . Hypertension   . Coronary artery disease CARDIOLOGIST--  DR Acie Fredrickson  . GERD (gastroesophageal reflux disease)   . H/O hiatal hernia   . OA (osteoarthritis)   . S/P dilatation of esophageal stricture     1992  . History of anal fissures   . Urethral stricture   . SUI (stress urinary incontinence), male   . Recurrent prostate adenocarcinoma first dx 09/2005--  radial prostatectomy-- gleason 4+3=7, pT2c, negative margin    04/2012--  BIOCHEMICAL  RECURRENCE to 0.33 /  SALVAGE IMRT SEPT to  NOV 2013  . ED (erectile dysfunction)   . History of colon polyps   . Sigmoid diverticulosis   . Bladder cancer   . At risk for sleep apnea     STOP-BANG= 6    SENT TO PCP 05-25-2014  . Wears glasses    Past Surgical History  Procedure Laterality Date  . Cystoscopy with urethral dilatation N/A 01/26/2014    Procedure: CYSTOSCOPY WITH URETHRAL DILATATION, BLADDER BIOPSY, FOLEY CATHETER;  Surgeon: Molli Hazard, MD;  Location: [facility];  Service: Urology;  Laterality: N/A;  . Knee arthroscopy Right   . Lumbar disc surgery  1980'S  . Cardiovascular stress test  08-18-2013  DR  NASHER    SMALL/ MEDIUM AREA MILD SCAR INFEROSEPTAL WALL/ NO ISCHEMIA/  LOW RISK SCAN/  EF 62%/  LV WALL MOTION WITH SLIGHT DECREASED OF THE SEPTUM  . Coronary angioplasty  1991    PTCA  of LEFT CFX  . Cardiac catheterization  08-27-2013  DR Martinique    CRITICAL OSTIAL LM STENOSIS/  diffuse disease LAD 40-50%  &   pLCx 30-40%/  . Coronary artery bypass graft N/A 08/27/2013    Procedure: CORONARY ARTERY BYPASS GRAFTING (CABG) times two using left internal mammary and right saphenous vein using endoscope.;  Surgeon: Melrose Nakayama, MD;  Location: [place];  Service: Open Heart Surgery;  Laterality: N/A;  . Inguinal hernia repair Bilateral 1994  . Radical retropubic prostatectomy w/ bilateral pelvic node dissection  11-18-2005  . Urethrotomy N/A 05/30/2014    Procedure: CYSTOSCOPY  BALLOON DILATION AND DIRECT VISION INTERNAL URETHROTOMY;  Surgeon: Sharyn Creamer, MD;  Location: [facility];  Service: Urology;  Laterality: N/A;  . Left heart catheterization with coronary angiogram N/A 08/27/2013    Procedure: LEFT HEART CATHETERIZATION WITH CORONARY ANGIOGRAM;  Surgeon: Peter M Martinique, MD;  Location: [facility] CATH LAB;  Service: Cardiovascular;  Laterality: N/A;    reports that he quit smoking about 20 years ago. His smoking use included Cigarettes. He has a 20 pack-year smoking history.  He has never used smokeless tobacco. He reports that he drinks alcohol. He reports that he does not use illicit drugs. family history includes Arthritis in his other; Cancer in his brother and father; Hyperlipidemia in his other; Hypertension in his other. Allergies  Allergen Reactions  . Bee Venom Other (See Comments)    Reaction unknown  . Amoxicillin Rash  . Hydrocodone Other (See Comments)    REACTION: disorientation      Review of Systems  Constitutional: Positive for appetite change. Negative for unexpected weight change.  Respiratory: Negative for shortness of breath.   Cardiovascular:  Negative for chest pain.  Gastrointestinal: Negative for abdominal pain.  Psychiatric/Behavioral: Positive for sleep disturbance and dysphoric mood. Negative for suicidal ideas, confusion and agitation.       Objective:   Physical Exam  Constitutional: He appears well-developed and well-nourished. No distress.  Cardiovascular: Normal rate and regular rhythm.   Pulmonary/Chest: Effort normal and breath sounds normal. No respiratory distress. He has no wheezes. He has no rales.  Musculoskeletal: He exhibits no edema.  Psychiatric:  Depressed mood. Judgment and thought content are normal          Assessment & Plan:  Grief reaction/adjustment disorder with depressed mood. He's had appropriate counseling. He is still struggling with issues and has several symptoms of depression-poor sleep, low motivation, crying spells,  Anhedonia.  . We recommend starting sertraline 50 mgs once daily and reassess in 3 weeks. Over 25 minutes spent with patient in direct care with counseling and evaluation.

## 2015-01-24 NOTE — Progress Notes (Signed)
Pre visit review using our clinic review tool, if applicable. No additional management support is needed unless otherwise documented below in the visit note. 

## 2015-02-06 ENCOUNTER — Other Ambulatory Visit: Payer: Self-pay | Admitting: *Deleted

## 2015-02-06 MED ORDER — ATORVASTATIN CALCIUM 40 MG PO TABS: 40.0000 mg | ORAL_TABLET | Freq: Every day | ORAL | Status: DC

## 2015-02-15 ENCOUNTER — Ambulatory Visit: Payer: Federal, State, Local not specified - PPO | Admitting: Family Medicine

## 2015-05-09 IMAGING — CR DG CHEST 2V
2 series · 2 of 2 positions shown · non-contrast
Comparison: 09/01/2013

CLINICAL DATA: Asymptomatic status post heart bypass 3 weeks ago

EXAM:
CHEST  2 VIEW

[w chest pa]
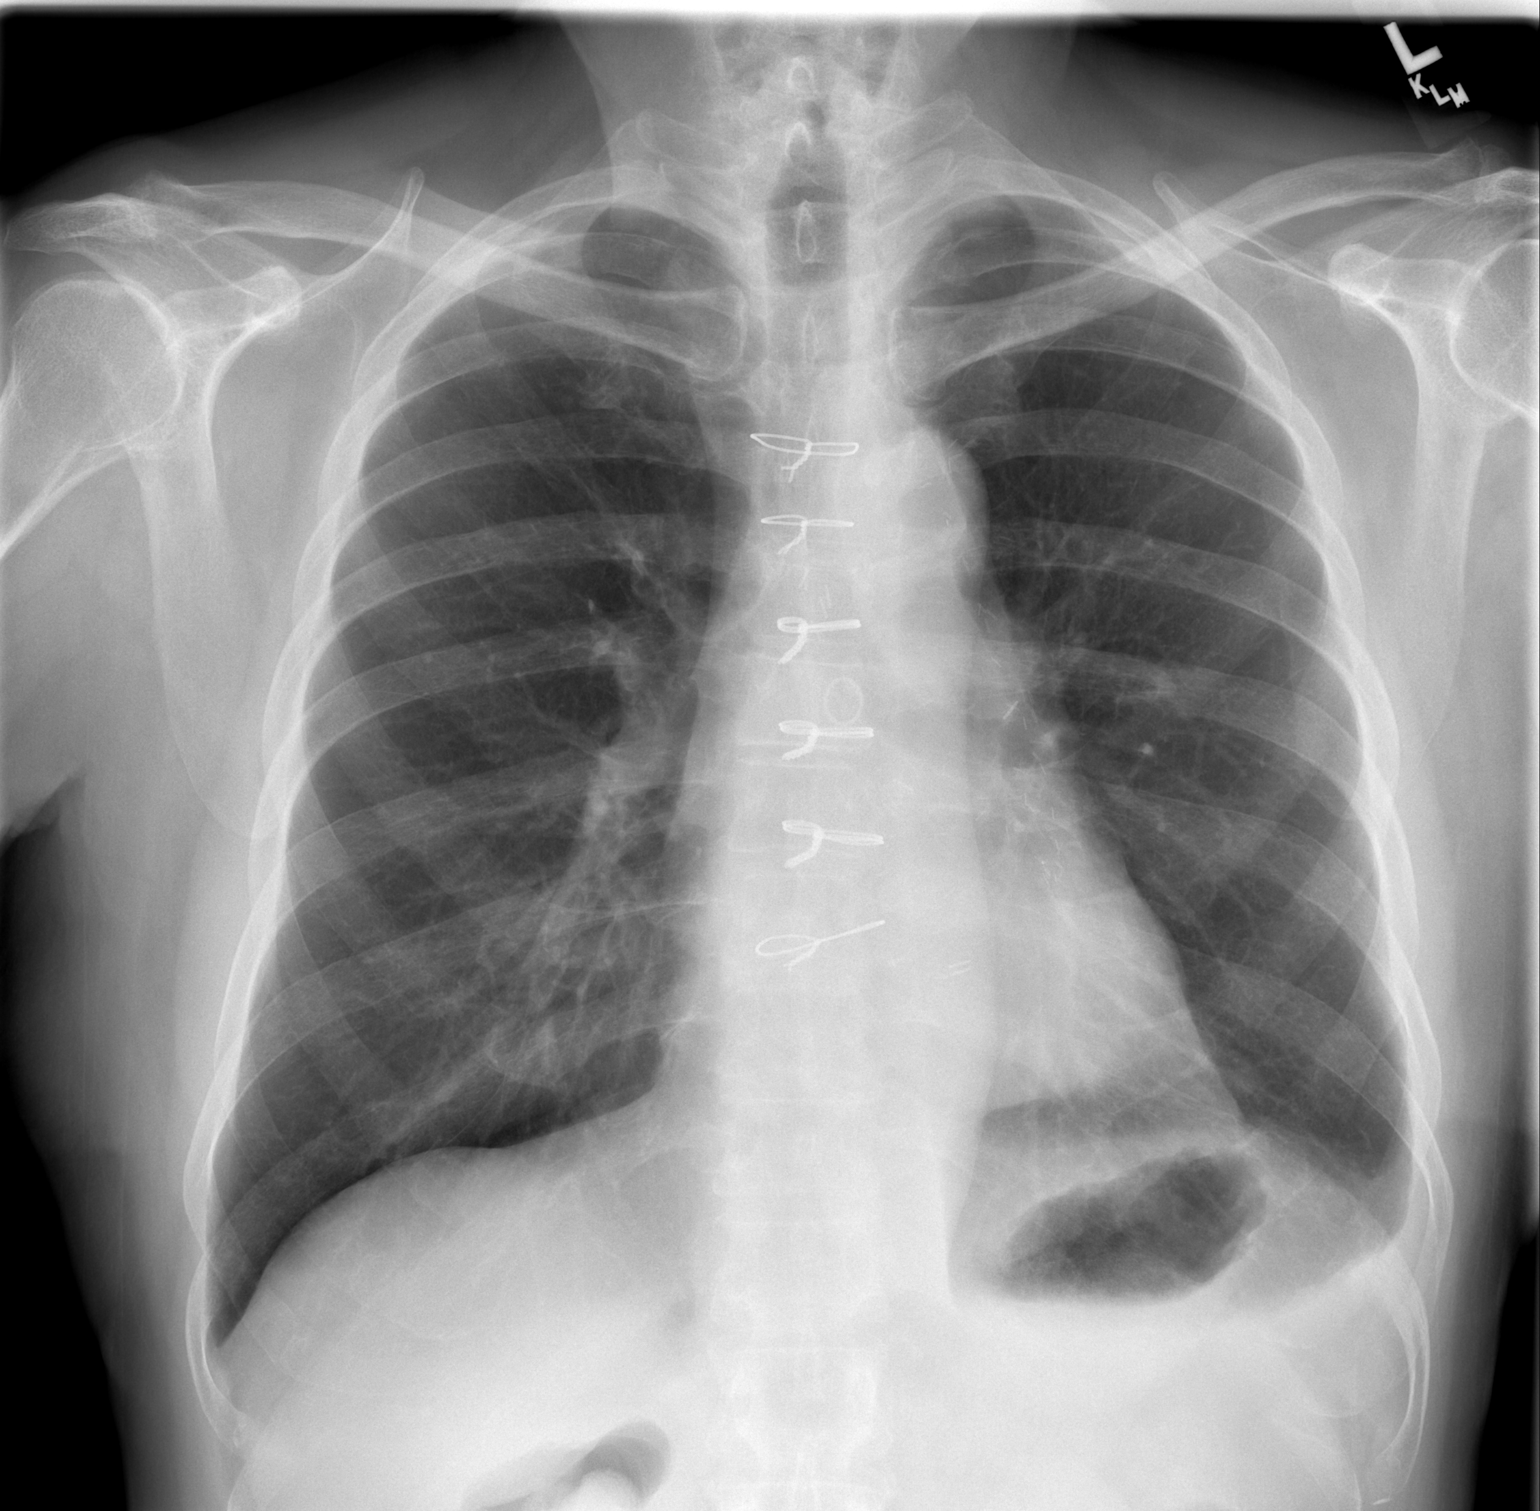

[w chest lat]
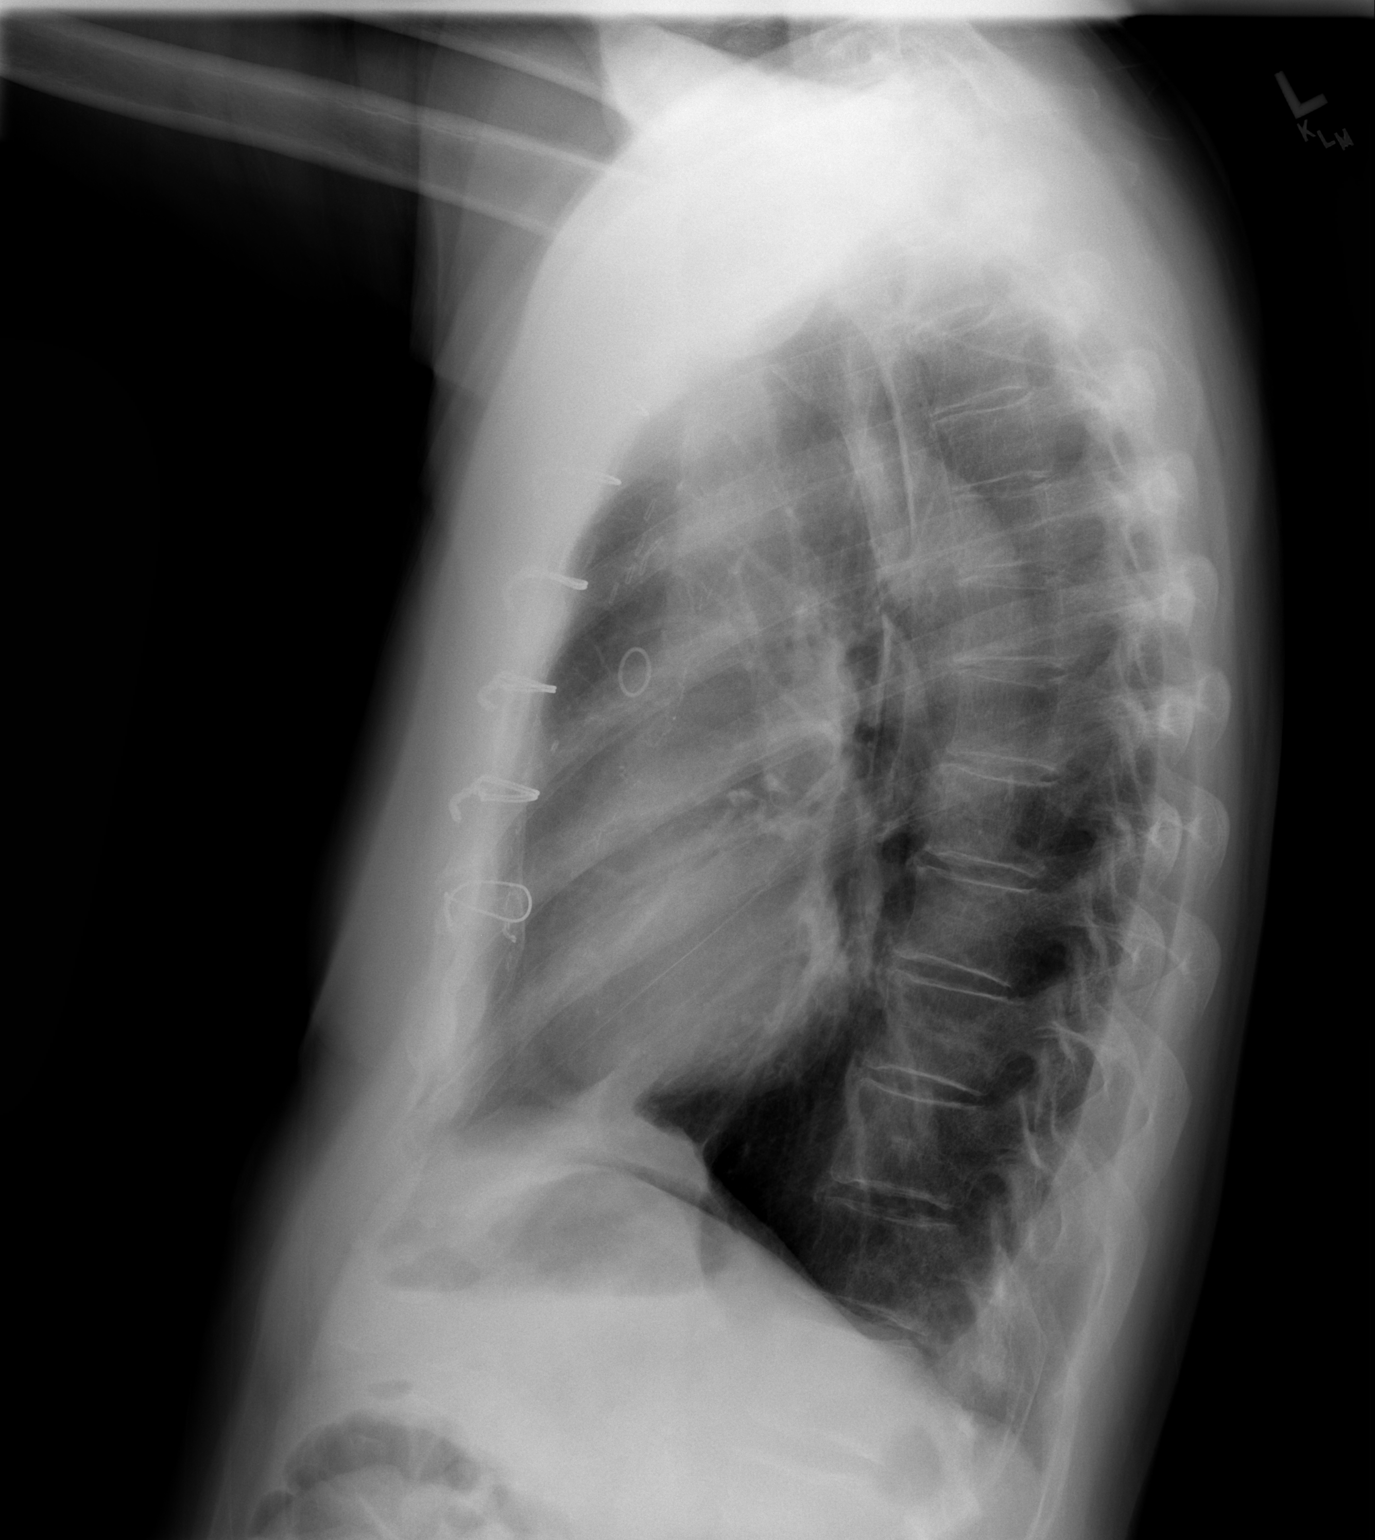

[2 of 2 positions shown; findings below may reference images not displayed]

FINDINGS: The lungs are well-expanded. There is mild flattening of the left
hemidiaphragm with blunting of the left lateral costophrenic angle.
The patient is status post CABG. The cardiac silhouette is normal in
size. The pulmonary vascularity is not engorged. The mediastinum is
normal in width. The observed portions of the bony thorax are
normal.
IMPRESSION: There is mild hyperinflation without evidence of pneumonia or CHF.
There has been resolution of the right pleural effusion and
improvement in the left pleural effusion since the previous study.

## 2015-07-03 DIAGNOSIS — C61 Malignant neoplasm of prostate: Secondary | ICD-10-CM | POA: Diagnosis not present

## 2015-07-10 DIAGNOSIS — Z8551 Personal history of malignant neoplasm of bladder: Secondary | ICD-10-CM | POA: Diagnosis not present

## 2015-07-10 DIAGNOSIS — N393 Stress incontinence (female) (male): Secondary | ICD-10-CM | POA: Diagnosis not present

## 2015-07-10 DIAGNOSIS — N320 Bladder-neck obstruction: Secondary | ICD-10-CM | POA: Diagnosis not present

## 2015-07-10 DIAGNOSIS — Z8546 Personal history of malignant neoplasm of prostate: Secondary | ICD-10-CM | POA: Diagnosis not present

## 2015-07-17 ENCOUNTER — Ambulatory Visit: Payer: Federal, State, Local not specified - PPO | Admitting: Cardiovascular Disease

## 2015-07-21 ENCOUNTER — Encounter: Payer: Self-pay | Admitting: Cardiovascular Disease

## 2015-07-21 ENCOUNTER — Ambulatory Visit (INDEPENDENT_AMBULATORY_CARE_PROVIDER_SITE_OTHER): Payer: Medicare Other | Admitting: Cardiovascular Disease

## 2015-07-21 VITALS — BP 122/76 | HR 62 | Ht 68.0 in | Wt 144.0 lb

## 2015-07-21 DIAGNOSIS — I2510 Atherosclerotic heart disease of native coronary artery without angina pectoris: Secondary | ICD-10-CM | POA: Diagnosis not present

## 2015-07-21 DIAGNOSIS — I25810 Atherosclerosis of coronary artery bypass graft(s) without angina pectoris: Secondary | ICD-10-CM | POA: Diagnosis not present

## 2015-07-21 NOTE — Patient Instructions (Addendum)
Grief counselor   - [name] [phone]  Medication Instructions:  Your physician recommends that you continue on your current medications as directed. Please refer to the Current Medication list given to you today.   Labwork: Your physician recommends that you return for lab work in: 6 months on the day of or a few days before your office visit with Dr. Acie Fredrickson.  You will need to FAST for this appointment - nothing to eat or drink after midnight the night before except water.   Testing/Procedures: None Ordered    Follow-Up: Your physician wants you to follow-up in: 6 months with Dr. Acie Fredrickson.  You will receive a reminder letter in the mail two months in advance. If you don't receive a letter, please call our office to schedule the follow-up appointment.

## 2015-07-21 NOTE — Progress Notes (Signed)
[name] Date of Birth  1936-09-04 [place] [address]    [place]   [place], [place]  [address]    [place], [place]  [address] [phone]  Fax  [phone]  [phone]  Fax [phone]  Problem list: 1. Coronary artery disease- status post remote PTCA to the left complex artery in 1991, CABG 2014 2. Hypertension 3. Hyperlipidemia 4. Prostate Cancer.  History of Present Illness:  [name] is  a 79 y.o. -year-old gentleman with a history of PTCA approximately 22 years ago.  He's not had any episodes of chest pain or shortness of breath. He stays fairly active although he doesn't exercise at the gym.  He has some problems with some arthritis-type pain in his right hip.  The Lipitor causes a little bit of muscular skeletal pain.  May 10, 2013:  He stopped hib Benicar 2 months ago. His BP has been running low at home.  He has been on a new medicine for his prostate and was concerned that his BP would run too low.   He has been taking a varied dose of benicar - 1 tab, 1/2 tab, 1/4 tablet whenever he thinks he needs it.   Sept. 5, 2014:  [name] presents today for worsening shortness of breath and chest pain.  Last Saturday, he mowed the lawn and spread 200 lbs of lime.  He developed severe CP and started to take some NTG.  The pain finally eased up. Several days later, he had recurrent CP while cleaning out the gutters.    He thinks he was just overheated.   He has been recording his BP and has been getting some low readings.     He has been having some hip pain and does not know if he will be able to walk on the treadmill.   Sept. 24, 2014: [name] was seen recently with some worsening chest pain. He had a stress Myoview study which was interpreted as low risk he has a fixed inferoapical defect.  He exertion, he is still having angina.  Lasts 5 minutes. In the center of his chest. No radiatioin.  Occurs only with  exertion ( stating mower or Rototiller- never walking around the house.)   Class II - III angina.    Yesterday, he had some mild baseline CP which worsened while walking around the grocery store.   Dec. 16, 2014:  [name] has had CABG since I last saw him in the office.  Still has chest tenderness - breathing is good, no CP  Chest wall is still tender.  May 11, 2014:  [name] is doing ok.   Jan. 4, 2015: Doing well  July 21, 2015:  Doing well from a cardiac standpoint  Still lonely ,  Mentally not where he needs to be since the death of his wife .   Visits with his 2 daughters frequently    Current Outpatient Prescriptions on File Prior to Visit  Medication Sig Dispense Refill  . aspirin 81 MG EC tablet Take 1 tablet (81 mg total) by mouth daily.    [name] atorvastatin (LIPITOR) 40 MG tablet Take 1 tablet (40 mg total) by mouth at bedtime. 90 tablet 3  . metoprolol succinate (TOPROL-XL) 25 MG 24 hr tablet TAKE 1 TABLET BY MOUTH DAILY 90 tablet 3  . Multiple Vitamin (MULTIVITAMIN WITH MINERALS) TABS tablet Take 1 tablet by mouth daily.    [name]  nitroGLYCERIN (NITROSTAT) 0.4 MG SL tablet Place 1 tablet (0.4 mg total) under the tongue every 5 (five) minutes as needed for chest pain. 25 tablet PRN  . pantoprazole (PROTONIX) 40 MG tablet Take 1 tablet (40 mg total) by mouth every other day. Takes in am 90 tablet 3  . polyethylene glycol (MIRALAX / GLYCOLAX) packet Take 17 g by mouth daily as needed (for constipation).     . sertraline (ZOLOFT) 50 MG tablet Take 1 tablet (50 mg total) by mouth daily. (Patient not taking: Reported on 07/21/2015) 30 tablet 6   No current facility-administered medications on file prior to visit.    Allergies  Allergen Reactions  . Bee Venom Other (See Comments)    Reaction unknown  . Amoxicillin Rash  . Hydrocodone Other (See Comments)    REACTION: disorientation    Past Medical History  Diagnosis Date  . History of radiation therapy 08/24/12-10/22/12     prostate 6600cGy/33 sessions--  for biochemical recurrence  . S/P CABG x 2     09/  2014  . Hyperlipidemia   . Benign positional vertigo   . Hypertension   . Coronary artery disease CARDIOLOGIST--  DR Acie Fredrickson  . GERD (gastroesophageal reflux disease)   . H/O hiatal hernia   . OA (osteoarthritis)   . S/P dilatation of esophageal stricture     1992  . History of anal fissures   . Urethral stricture   . SUI (stress urinary incontinence), male   . Recurrent prostate adenocarcinoma first dx 09/2005--  radial prostatectomy-- gleason 4+3=7, pT2c, negative margin    04/2012--  BIOCHEMICAL  RECURRENCE to 0.33 /  SALVAGE IMRT SEPT to  NOV 2013  . ED (erectile dysfunction)   . History of colon polyps   . Sigmoid diverticulosis   . Bladder cancer   . At risk for sleep apnea     STOP-BANG= 6    SENT TO PCP 05-25-2014  . Wears glasses     Past Surgical History  Procedure Laterality Date  . Cystoscopy with urethral dilatation N/A 01/26/2014    Procedure: CYSTOSCOPY WITH URETHRAL DILATATION, BLADDER BIOPSY, FOLEY CATHETER;  Surgeon: Molli Hazard, MD;  [facility];  Service: Urology;  Laterality: N/A;  . Knee arthroscopy Right   . Lumbar disc surgery  1980'S  . Cardiovascular stress test  08-18-2013  DR NASHER    SMALL/ MEDIUM AREA MILD SCAR INFEROSEPTAL WALL/ NO ISCHEMIA/  LOW RISK SCAN/  EF 62%/  LV WALL MOTION WITH SLIGHT DECREASED OF THE SEPTUM  . Coronary angioplasty  1991    PTCA  of LEFT CFX  . Cardiac catheterization  08-27-2013  DR Martinique    CRITICAL OSTIAL LM STENOSIS/  diffuse disease LAD 40-50%  &   pLCx 30-40%/  . Coronary artery bypass graft N/A 08/27/2013    Procedure: CORONARY ARTERY BYPASS GRAFTING (CABG) times two using left internal mammary and right saphenous vein using endoscope.;  Surgeon: Melrose Nakayama, MD;  Location: [facility];  Service: Open Heart Surgery;  Laterality: N/A;  . Inguinal hernia repair Bilateral 1994  . Radical retropubic prostatectomy w/  bilateral pelvic node dissection  11-18-2005  . Urethrotomy N/A 05/30/2014    Procedure: CYSTOSCOPY  BALLOON DILATION AND DIRECT VISION INTERNAL URETHROTOMY;  Surgeon: Sharyn Creamer, MD;  Location: [facility];  Service: Urology;  Laterality: N/A;  . Left heart catheterization with coronary angiogram N/A 08/27/2013    Procedure: LEFT HEART CATHETERIZATION WITH CORONARY ANGIOGRAM;  Surgeon: Peter M Martinique, MD;  Location: [facility] CATH LAB;  Service: Cardiovascular;  Laterality: N/A;    History  Smoking status  . Former Smoker -- 2.00 packs/day for 10 years  . Types: Cigarettes  . Quit date: 12/03/1994  Smokeless tobacco  . Never Used    History  Alcohol Use  . Yes    Comment: occasionally    Family History  Problem Relation Age of Onset  . Cancer Father     prostate died 19 something  . Arthritis Other   . Hyperlipidemia Other   . Hypertension Other   . Cancer Brother     prostate cancer    Reviw of Systems:  Reviewed in the HPI.  All other systems are negative.  Physical Exam: Blood pressure 122/76, pulse 62, height 5\' 8"  (1.727 m), weight 65.318 kg (144 lb), SpO2 99 %. General: Well developed, well nourished, in no acute distress.  Head: Normocephalic, atraumatic, sclera non-icteric, mucus membranes are moist,   Neck: Supple. Negative for carotid bruits. JVD not elevated.  Lungs: Clear bilaterally to auscultation without wheezes, rales, or rhonchi. Breathing is unlabored.  Heart: RRR with S1 S2. No murmurs, rubs, or gallops appreciated.  Abdomen: Soft, non-tender, non-distended with normoactive bowel sounds.  + pulsitile mass c/w small abdominal aneurism.   Msk:  Strength and tone appear normal for age.  Extremities: No clubbing or cyanosis. No edema.  Distal pedal pulses are 2+ and equal bilaterally.  Neuro: Alert and oriented X 3. Moves all extremities spontaneously.  Psych:  Responds to questions appropriately with a normal affect.  ECG: Jan.  4, 2016:  NSR at 63, normal   Assessment / Plan:   1. Coronary artery disease- status post remote PTCA to the left complex artery in 1991, CABG 2014, he is doing well. No angina.  2. Hypertension - BP is well controlled  3. Hyperlipidemia- lipids are well conrolled  4. Prostate Cancer. 5. Abdominal aortic aneurism  Abdominal US  Sept. 2015 IMPRESSION: Distal abdominal aortic aneurysm with maximum transverse diameter 3.3 x 3.8 cm. Aneurysm measures 4.7 cm in length.   Nahser, Wonda Cheng, MD  07/21/2015 9:02 AM    [facility] [place],  [place], [place]  [address] Pager [phone] Phone: [phone]; Fax: [phone]   [facility]  [address] [place],   [address] [phone]   Fax [phone]

## 2015-08-10 ENCOUNTER — Ambulatory Visit (INDEPENDENT_AMBULATORY_CARE_PROVIDER_SITE_OTHER): Payer: Medicare Other | Admitting: Family Medicine

## 2015-08-10 ENCOUNTER — Encounter: Payer: Self-pay | Admitting: Family Medicine

## 2015-08-10 VITALS — BP 126/78 | HR 60 | Temp 98.0°F | Ht 67.0 in | Wt 144.0 lb

## 2015-08-10 DIAGNOSIS — E785 Hyperlipidemia, unspecified: Secondary | ICD-10-CM | POA: Diagnosis not present

## 2015-08-10 DIAGNOSIS — I714 Abdominal aortic aneurysm, without rupture, unspecified: Secondary | ICD-10-CM

## 2015-08-10 DIAGNOSIS — Z951 Presence of aortocoronary bypass graft: Secondary | ICD-10-CM

## 2015-08-10 DIAGNOSIS — I25810 Atherosclerosis of coronary artery bypass graft(s) without angina pectoris: Secondary | ICD-10-CM | POA: Diagnosis not present

## 2015-08-10 DIAGNOSIS — I10 Essential (primary) hypertension: Secondary | ICD-10-CM | POA: Diagnosis not present

## 2015-08-10 DIAGNOSIS — Z0000 Encounter for general adult medical examination without abnormal findings: Secondary | ICD-10-CM | POA: Diagnosis not present

## 2015-08-10 DIAGNOSIS — F4321 Adjustment disorder with depressed mood: Secondary | ICD-10-CM | POA: Diagnosis not present

## 2015-08-10 DIAGNOSIS — Z23 Encounter for immunization: Secondary | ICD-10-CM | POA: Diagnosis not present

## 2015-08-10 LAB — BASIC METABOLIC PANEL
BUN: 26 mg/dL — ABNORMAL HIGH (ref 6–23)
CO2: 30 mEq/L (ref 19–32)
Calcium: 9.8 mg/dL (ref 8.4–10.5)
Chloride: 103 mEq/L (ref 96–112)
Creatinine, Ser: 1.43 mg/dL (ref 0.40–1.50)
GFR: 50.9 mL/min — ABNORMAL LOW (ref >60.00)
Glucose, Bld: 90 mg/dL (ref 70–99)
Potassium: 5.7 mEq/L — ABNORMAL HIGH (ref 3.5–5.1)
Sodium: 139 mEq/L (ref 135–145)

## 2015-08-10 LAB — HEPATIC FUNCTION PANEL
ALT: 22 U/L (ref 0–53)
AST: 23 U/L (ref 0–37)
Albumin: 4.3 g/dL (ref 3.5–5.2)
Alkaline Phosphatase: 62 U/L (ref 39–117)
Bilirubin, Direct: 0.2 mg/dL (ref 0.0–0.3)
Total Bilirubin: 0.7 mg/dL (ref 0.2–1.2)
Total Protein: 7.1 g/dL (ref 6.0–8.3)

## 2015-08-10 LAB — LIPID PANEL
Cholesterol: 124 mg/dL (ref 0–200)
HDL: 45.9 mg/dL (ref >39.00)
LDL Cholesterol: 63 mg/dL (ref 0–99)
NonHDL: 78.51
Total CHOL/HDL Ratio: 3
Triglycerides: 77 mg/dL (ref 0.0–149.0)
VLDL: 15.4 mg/dL (ref 0.0–40.0)

## 2015-08-10 NOTE — Progress Notes (Signed)
Subjective:    Patient ID: [name], male    DOB: 12/23/1935, 79 y.o.   MRN: [id number]  HPI Patient seen for Medicare wellness exam and medical follow-up. He has multiple chronic problems including history of dyslipidemia, hypertension, CAD with bypass over year ago, aortic aneurysm which measured 4.7 cm last year,, history of prostate cancer followed by urology.  His wife passed away 11 months ago and has continued to battle issues with that. We prescribed Zoloft but he never started this. He has low motivation and feels sad much of the time. No suicidal ideation.  Needs flu vaccine. Other immunizations up-to-date.  Past Medical History  Diagnosis Date  . History of radiation therapy 08/24/12-10/22/12    prostate 6600cGy/33 sessions--  for biochemical recurrence  . S/P CABG x 2     09/  2014  . Hyperlipidemia   . Benign positional vertigo   . Hypertension   . Coronary artery disease CARDIOLOGIST--  DR Acie Fredrickson  . GERD (gastroesophageal reflux disease)   . H/O hiatal hernia   . OA (osteoarthritis)   . S/P dilatation of esophageal stricture     1992  . History of anal fissures   . Urethral stricture   . SUI (stress urinary incontinence), male   . Recurrent prostate adenocarcinoma first dx 09/2005--  radial prostatectomy-- gleason 4+3=7, pT2c, negative margin    04/2012--  BIOCHEMICAL  RECURRENCE to 0.33 /  SALVAGE IMRT SEPT to  NOV 2013  . ED (erectile dysfunction)   . History of colon polyps   . Sigmoid diverticulosis   . Bladder cancer   . At risk for sleep apnea     STOP-BANG= 6    SENT TO PCP 05-25-2014  . Wears glasses    Past Surgical History  Procedure Laterality Date  . Cystoscopy with urethral dilatation N/A 01/26/2014    Procedure: CYSTOSCOPY WITH URETHRAL DILATATION, BLADDER BIOPSY, FOLEY CATHETER;  Surgeon: Molli Hazard, MD;  Location: [facility];  Service: Urology;  Laterality: N/A;  . Knee arthroscopy Right   . Lumbar disc surgery  1980'S  .  Cardiovascular stress test  08-18-2013  DR NASHER    SMALL/ MEDIUM AREA MILD SCAR INFEROSEPTAL WALL/ NO ISCHEMIA/  LOW RISK SCAN/  EF 62%/  LV WALL MOTION WITH SLIGHT DECREASED OF THE SEPTUM  . Coronary angioplasty  1991    PTCA  of LEFT CFX  . Cardiac catheterization  08-27-2013  DR Martinique    CRITICAL OSTIAL LM STENOSIS/  diffuse disease LAD 40-50%  &   pLCx 30-40%/  . Coronary artery bypass graft N/A 08/27/2013    Procedure: CORONARY ARTERY BYPASS GRAFTING (CABG) times two using left internal mammary and right saphenous vein using endoscope.;  Surgeon: Melrose Nakayama, MD;  Location: [facility];  Service: Open Heart Surgery;  Laterality: N/A;  . Inguinal hernia repair Bilateral 1994  . Radical retropubic prostatectomy w/ bilateral pelvic node dissection  11-18-2005  . Urethrotomy N/A 05/30/2014    Procedure: CYSTOSCOPY  BALLOON DILATION AND DIRECT VISION INTERNAL URETHROTOMY;  Surgeon: Sharyn Creamer, MD;  Location: [facility];  Service: Urology;  Laterality: N/A;  . Left heart catheterization with coronary angiogram N/A 08/27/2013    Procedure: LEFT HEART CATHETERIZATION WITH CORONARY ANGIOGRAM;  Surgeon: Peter M Martinique, MD;  Location: [facility] CATH LAB;  Service: Cardiovascular;  Laterality: N/A;    reports that he quit smoking about 20 years ago. His smoking use included Cigarettes. He has a  20 pack-year smoking history. He has never used smokeless tobacco. He reports that he drinks alcohol. He reports that he does not use illicit drugs. family history includes Arthritis in his other; Cancer in his brother and father; Hyperlipidemia in his other; Hypertension in his other. Allergies  Allergen Reactions  . Bee Venom Other (See Comments)    Reaction unknown  . Amoxicillin Rash  . Hydrocodone Other (See Comments)    REACTION: disorientation   1.  Risk factors based on Past Medical , Social, and Family history reviewed and as indicated above with no changes 2.  Limitations in  physical activities None.  No recent falls. 3.  Depression/mood ongoing depression symptoms following loss of his wife 11 months ago. He declines counseling at this time. Administer pH Q-9 4.  Hearing No defiits 5.  ADLs independent in all. 6.  Cognitive function (orientation to time and place, language, writing, speech,memory) no short or long term memory issues.  Language and judgement intact. 7.  Home Safety no issues 8.  Height, weight, and visual acuity.he is complaining this is subjective decreased vision in recent months and plans to get eye exam soon. His weight is down 4 pounds further from last year 9.  Counseling discussed importance of staying active and increased socialization 10. Recommendation of preventive services. Flu vaccine given 11. Labs based on risk factors lipid, hepatic, basic metabolic panel 12. Care Plan needs follow-up ultrasound abdomen to reassess aortic aneurysm 13. Other Providers Dr Acie Fredrickson Cardiology  Dr Jasmine December Urology. 14. Written schedule of screening/prevention services given to patient.    Review of Systems  Constitutional: Positive for fatigue. Negative for fever, activity change and appetite change.  HENT: Negative for congestion, ear pain and trouble swallowing.   Eyes: Negative for pain and visual disturbance.  Respiratory: Negative for cough, shortness of breath and wheezing.   Cardiovascular: Negative for chest pain, palpitations and leg swelling.  Gastrointestinal: Negative for nausea, vomiting, abdominal pain, diarrhea, constipation, blood in stool, abdominal distention and rectal pain.  Endocrine: Negative for polydipsia and polyuria.  Genitourinary: Negative for hematuria and testicular pain.  Musculoskeletal: Negative for joint swelling and arthralgias.  Skin: Negative for rash.  Neurological: Negative for dizziness, syncope and headaches.  Hematological: Negative for adenopathy.  Psychiatric/Behavioral: Positive for dysphoric mood.  Negative for suicidal ideas and confusion.       Objective:   Physical Exam  Constitutional: He is oriented to person, place, and time. He appears well-developed and well-nourished. No distress.  HENT:  Head: Normocephalic and atraumatic.  Right Ear: External ear normal.  Left Ear: External ear normal.  Mouth/Throat: Oropharynx is clear and moist.  Eyes: Conjunctivae and EOM are normal. Pupils are equal, round, and reactive to light.  Neck: Normal range of motion. Neck supple. No thyromegaly present.  Cardiovascular: Normal rate, regular rhythm and normal heart sounds.   No murmur heard. Pulmonary/Chest: No respiratory distress. He has no wheezes. He has no rales.  Abdominal: Soft. Bowel sounds are normal. He exhibits no distension. There is no tenderness. There is no rebound and no guarding.  Musculoskeletal: He exhibits no edema.  Lymphadenopathy:    He has no cervical adenopathy.  Neurological: He is alert and oriented to person, place, and time. He displays normal reflexes. No cranial nerve deficit.  Skin: No rash noted.  Psychiatric: He has a normal mood and affect.          Assessment & Plan:  #1 Medicare wellness exam. Flu vaccine given.  Other immunizations up-to-date #2 history of abdominal aortic aneurysm. Set up repeat aortic ultrasound screen #3 hypertension stable and at goal #4 history of CAD/dyslipidemia. Recheck fasting lipid and hepatic panel #5 adjustment disorder with depressed mood. Administer PHQ-9. If indicated, consider sertraline which we had prescribed several months ago. We've offered counseling and he declines PHQ-9 score of 6.  He is reluctant to take any medications at this time.

## 2015-08-10 NOTE — Progress Notes (Signed)
Pre visit review using our clinic review tool, if applicable. No additional management support is needed unless otherwise documented below in the visit note. 

## 2015-08-15 ENCOUNTER — Ambulatory Visit
Admission: RE | Admit: 2015-08-15 | Discharge: 2015-08-15 | Disposition: A | Discharge status: Home / Self Care | Payer: Medicare Other | Source: Ambulatory Visit | Attending: Family Medicine | Admitting: Family Medicine

## 2015-08-15 DIAGNOSIS — I714 Abdominal aortic aneurysm, without rupture, unspecified: Secondary | ICD-10-CM

## 2015-08-16 ENCOUNTER — Encounter: Payer: Self-pay | Admitting: Family Medicine

## 2015-08-16 ENCOUNTER — Ambulatory Visit (INDEPENDENT_AMBULATORY_CARE_PROVIDER_SITE_OTHER): Payer: Medicare Other | Admitting: Family Medicine

## 2015-08-16 VITALS — BP 130/70 | HR 60 | Temp 98.2°F | Ht 67.0 in | Wt 146.0 lb

## 2015-08-16 DIAGNOSIS — I25810 Atherosclerosis of coronary artery bypass graft(s) without angina pectoris: Secondary | ICD-10-CM

## 2015-08-16 DIAGNOSIS — R21 Rash and other nonspecific skin eruption: Secondary | ICD-10-CM | POA: Diagnosis not present

## 2015-08-16 DIAGNOSIS — L259 Unspecified contact dermatitis, unspecified cause: Secondary | ICD-10-CM

## 2015-08-16 MED ORDER — METHYLPREDNISOLONE ACETATE 80 MG/ML IJ SUSP
80.0000 mg | Freq: Once | INTRAMUSCULAR | Status: AC
  Administered 2015-08-16: 80 mg via INTRAMUSCULAR

## 2015-08-16 MED ORDER — PREDNISONE 10 MG PO TABS: ORAL_TABLET | ORAL | Status: DC

## 2015-08-16 NOTE — Patient Instructions (Signed)

## 2015-08-16 NOTE — Progress Notes (Signed)
Pre visit review using our clinic review tool, if applicable. No additional management support is needed unless otherwise documented below in the visit note. 

## 2015-08-16 NOTE — Progress Notes (Signed)
Subjective:    Patient ID: [name], male    DOB: [birth year], 79 y.o.   MRN: [id number]  HPI Patient seen with pruritic rash involving both forearms and legs. Onset a couple days ago. Moderate pruritus. He had been doing some workout around some weeds. Using topical over-the-counter cream with minimal relief. Pruritus exacerbated by heat  Past Medical History  Diagnosis Date  . History of radiation therapy 08/24/12-10/22/12    prostate 6600cGy/33 sessions--  for biochemical recurrence  . S/P CABG x 2     09/  2014  . Hyperlipidemia   . Benign positional vertigo   . Hypertension   . Coronary artery disease CARDIOLOGIST--  DR Acie Fredrickson  . GERD (gastroesophageal reflux disease)   . H/O hiatal hernia   . OA (osteoarthritis)   . S/P dilatation of esophageal stricture     1992  . History of anal fissures   . Urethral stricture   . SUI (stress urinary incontinence), male   . Recurrent prostate adenocarcinoma first dx 09/2005--  radial prostatectomy-- gleason 4+3=7, pT2c, negative margin    04/2012--  BIOCHEMICAL  RECURRENCE to 0.33 /  SALVAGE IMRT SEPT to  NOV 2013  . ED (erectile dysfunction)   . History of colon polyps   . Sigmoid diverticulosis   . Bladder cancer   . At risk for sleep apnea     STOP-BANG= 6    SENT TO PCP 05-25-2014  . Wears glasses    Past Surgical History  Procedure Laterality Date  . Cystoscopy with urethral dilatation N/A 01/26/2014    Procedure: CYSTOSCOPY WITH URETHRAL DILATATION, BLADDER BIOPSY, FOLEY CATHETER;  Surgeon: Molli Hazard, MD;  Location: [facility];  Service: Urology;  Laterality: N/A;  . Knee arthroscopy Right   . Lumbar disc surgery  1980'S  . Cardiovascular stress test  08-18-2013  DR NASHER    SMALL/ MEDIUM AREA MILD SCAR INFEROSEPTAL WALL/ NO ISCHEMIA/  LOW RISK SCAN/  EF 62%/  LV WALL MOTION WITH SLIGHT DECREASED OF THE SEPTUM  . Coronary angioplasty  1991    PTCA  of LEFT CFX  . Cardiac catheterization  08-27-2013  DR Martinique     CRITICAL OSTIAL LM STENOSIS/  diffuse disease LAD 40-50%  &   pLCx 30-40%/  . Coronary artery bypass graft N/A 08/27/2013    Procedure: CORONARY ARTERY BYPASS GRAFTING (CABG) times two using left internal mammary and right saphenous vein using endoscope.;  Surgeon: Melrose Nakayama, MD;  Location: [facility];  Service: Open Heart Surgery;  Laterality: N/A;  . Inguinal hernia repair Bilateral 1994  . Radical retropubic prostatectomy w/ bilateral pelvic node dissection  11-18-2005  . Urethrotomy N/A 05/30/2014    Procedure: CYSTOSCOPY  BALLOON DILATION AND DIRECT VISION INTERNAL URETHROTOMY;  Surgeon: Sharyn Creamer, MD;  Location: [facility];  Service: Urology;  Laterality: N/A;  . Left heart catheterization with coronary angiogram N/A 08/27/2013    Procedure: LEFT HEART CATHETERIZATION WITH CORONARY ANGIOGRAM;  Surgeon: Peter M Martinique, MD;  Location: [facility] CATH LAB;  Service: Cardiovascular;  Laterality: N/A;    reports that he quit smoking about 20 years ago. His smoking use included Cigarettes. He has a 20 pack-year smoking history. He has never used smokeless tobacco. He reports that he drinks alcohol. He reports that he does not use illicit drugs. family history includes Arthritis in his other; Cancer in his brother and father; Hyperlipidemia in his other; Hypertension in his other. Allergies  Allergen Reactions  . Bee Venom Other (See Comments)    Reaction unknown  . Amoxicillin Rash  . Hydrocodone Other (See Comments)    REACTION: disorientation      Review of Systems  Constitutional: Negative for fever and chills.  Skin: Positive for rash.       Objective:   Physical Exam  Constitutional: He appears well-developed and well-nourished. No distress.  Cardiovascular: Normal rate and regular rhythm.   Pulmonary/Chest: Effort normal and breath sounds normal. No respiratory distress. He has no wheezes. He has no rales.  Skin: Rash noted.  Scattered rash on his  forearms. He has erythematous slightly raised rash. No pustules          Assessment & Plan:  Contact dermatitis. Reviewed options. Patient requesting further treatment. Depo-Medrol 80 mg IM. Would start oral prednisone only if symptoms not adequately controlled with the above

## 2015-09-06 ENCOUNTER — Telehealth: Payer: Self-pay | Admitting: Family Medicine

## 2015-09-06 NOTE — Telephone Encounter (Signed)
Dr. Elease Hashimoto wanted to ensure patient had not had any unilateral or sudden vision loss. If so, patient should go to ED promptly. Spoke with patient and he advised he can see normally just now since taking his anti-depressand his vision is "mildly" blurry for those things far away. Otherwise, no other symptoms. Appointment scheduled tomorrow to see Dr. Elease Hashimoto at 10:45 Educated patient to go to ED if vision worsens or loss becomes sudden. Patient verbalized understanding.

## 2015-09-06 NOTE — Telephone Encounter (Signed)
Patient Name: [name] DOB: 02/04/1936 Initial Comment Caller states he was put on depression medication about 20 days ago, it's changing his vision, he can't see things on the TV. like he use to. Sertraline 50mg - 1x a day. Stopped taking predisone a couple days ago. Nurse Assessment Nurse: Vallery Sa, RN, Cathy Date/Time (Eastern Time): 09/06/2015 11:52:47 AM Confirm and document reason for call. If symptomatic, describe symptoms. ---Caller states he developed sudden blurry vision a week ago. No injury in the past week. He was taking prednisone for a skin reaction that he completed about about 2 days ago. Has the patient traveled out of the country within the last 30 days? ---No Does the patient have any new or worsening symptoms? ---Yes Will a triage be completed? ---Yes Related visit to physician within the last 2 weeks? ---Yes Does the PT have any chronic conditions? (i.e. diabetes, asthma, etc.) ---Yes List chronic conditions. ---Skin rash, Bladder leakage, Cardiac Bypass about a year ago Guidelines Guideline Title Affirmed Question Affirmed Notes Vision Loss or Change [1] Blurred vision or visual changes AND [2] present now AND [3] sudden onset or new (e.g., minutes, hours, days) (Exception: previously diagnosed migraine headaches with same symptoms) Final Disposition User Go to ED Now (or PCP triage) Vallery Sa, RN, Cathy Comments Caller declined the Go to ER disposition or a quick appointment at the office. He asks if the MD can see him tomorrow. Called the office backline and notified Arbie Cookey who asks that we send the record to the office. She states they will call him back. Referrals GO TO FACILITY REFUSED Disagree/Comply: Disagree Disagree/Comply Reason: Disagree with instructions

## 2015-09-07 ENCOUNTER — Ambulatory Visit (INDEPENDENT_AMBULATORY_CARE_PROVIDER_SITE_OTHER): Payer: Medicare Other | Admitting: Family Medicine

## 2015-09-07 ENCOUNTER — Encounter: Payer: Self-pay | Admitting: Family Medicine

## 2015-09-07 VITALS — BP 114/70 | HR 73 | Temp 98.3°F | Ht 67.0 in | Wt 142.1 lb

## 2015-09-07 DIAGNOSIS — I25810 Atherosclerosis of coronary artery bypass graft(s) without angina pectoris: Secondary | ICD-10-CM

## 2015-09-07 DIAGNOSIS — H538 Other visual disturbances: Secondary | ICD-10-CM

## 2015-09-07 DIAGNOSIS — F4321 Adjustment disorder with depressed mood: Secondary | ICD-10-CM | POA: Diagnosis not present

## 2015-09-07 NOTE — Progress Notes (Signed)
Subjective:    Patient ID: [name], male    DOB: Oct 08, 1936, 79 y.o.   MRN: [id number]  HPI Patient seen with chief complaint of bilateral blurred vision. About 3 weeks ago started sertraline. He has been grieving following loss of his wife and had progressive depression symptoms. Even though he scored fairly low on pH Q9 he was very concerned about daily depressive symptoms and is requesting going on sertraline. He has seen great improvement in his mood since starting this but after being on medication for about a week started having some mild bilateral blurred vision. No diplopia. No headaches. No focal weakness. He states he saw ophthalmologist about January this year. Does have bilateral cataracts. He has not had any recent speech changes or any confusion or any focal weakness.  He was recently on some prednisone following contact dermatitis but has no history of diabetes and no polyuria or polydipsia.  Past Medical History  Diagnosis Date  . History of radiation therapy 08/24/12-10/22/12    prostate 6600cGy/33 sessions--  for biochemical recurrence  . S/P CABG x 2     09/  2014  . Hyperlipidemia   . Benign positional vertigo   . Hypertension   . Coronary artery disease CARDIOLOGIST--  DR Acie Fredrickson  . GERD (gastroesophageal reflux disease)   . H/O hiatal hernia   . OA (osteoarthritis)   . S/P dilatation of esophageal stricture     1992  . History of anal fissures   . Urethral stricture   . SUI (stress urinary incontinence), male   . Recurrent prostate adenocarcinoma ([place]) first dx 09/2005--  radial prostatectomy-- gleason 4+3=7, pT2c, negative margin    04/2012--  BIOCHEMICAL  RECURRENCE to 0.33 /  SALVAGE IMRT SEPT to  NOV 2013  . ED (erectile dysfunction)   . History of colon polyps   . Sigmoid diverticulosis   . Bladder cancer ([place])   . At risk for sleep apnea     STOP-BANG= 6    SENT TO PCP 05-25-2014  . Wears glasses    Past Surgical History  Procedure Laterality  Date  . Cystoscopy with urethral dilatation N/A 01/26/2014    Procedure: CYSTOSCOPY WITH URETHRAL DILATATION, BLADDER BIOPSY, FOLEY CATHETER;  Surgeon: Molli Hazard, MD;  Location: [facility];  Service: Urology;  Laterality: N/A;  . Knee arthroscopy Right   . Lumbar disc surgery  1980'S  . Cardiovascular stress test  08-18-2013  DR NASHER    SMALL/ MEDIUM AREA MILD SCAR INFEROSEPTAL WALL/ NO ISCHEMIA/  LOW RISK SCAN/  EF 62%/  LV WALL MOTION WITH SLIGHT DECREASED OF THE SEPTUM  . Coronary angioplasty  1991    PTCA  of LEFT CFX  . Cardiac catheterization  08-27-2013  DR Martinique    CRITICAL OSTIAL LM STENOSIS/  diffuse disease LAD 40-50%  &   pLCx 30-40%/  . Coronary artery bypass graft N/A 08/27/2013    Procedure: CORONARY ARTERY BYPASS GRAFTING (CABG) times two using left internal mammary and right saphenous vein using endoscope.;  Surgeon: Melrose Nakayama, MD;  Location: [facility];  Service: Open Heart Surgery;  Laterality: N/A;  . Inguinal hernia repair Bilateral 1994  . Radical retropubic prostatectomy w/ bilateral pelvic node dissection  11-18-2005  . Urethrotomy N/A 05/30/2014    Procedure: CYSTOSCOPY  BALLOON DILATION AND DIRECT VISION INTERNAL URETHROTOMY;  Surgeon: Sharyn Creamer, MD;  Location: [facility];  Service: Urology;  Laterality: N/A;  . Left heart catheterization  with coronary angiogram N/A 08/27/2013    Procedure: LEFT HEART CATHETERIZATION WITH CORONARY ANGIOGRAM;  Surgeon: Peter M Martinique, MD;  Location: [facility] CATH LAB;  Service: Cardiovascular;  Laterality: N/A;    reports that he quit smoking about 20 years ago. His smoking use included Cigarettes. He has a 20 pack-year smoking history. He has never used smokeless tobacco. He reports that he drinks alcohol. He reports that he does not use illicit drugs. family history includes Arthritis in his other; Cancer in his brother and father; Hyperlipidemia in his other; Hypertension in his other. Allergies    Allergen Reactions  . Bee Venom Other (See Comments)    Reaction unknown  . Amoxicillin Rash  . Hydrocodone Other (See Comments)    REACTION: disorientation      Review of Systems  Constitutional: Negative for fever, chills, appetite change and unexpected weight change.  Eyes: Positive for visual disturbance. Negative for photophobia, pain, discharge, redness and itching.  Respiratory: Negative for shortness of breath.   Cardiovascular: Negative for chest pain.  Neurological: Negative for syncope, speech difficulty, weakness and headaches.  Psychiatric/Behavioral: Negative for suicidal ideas, confusion and agitation.       Objective:   Physical Exam  Constitutional: He is oriented to person, place, and time. He appears well-developed and well-nourished.  Eyes: Pupils are equal, round, and reactive to light.  Pupils equal round and reactive to light. He has bilateral cataracts. Fundi were not seen well secondary to cataracts.  Neck: Neck supple. No thyromegaly present.  Cardiovascular: Normal rate and regular rhythm.   Pulmonary/Chest: Effort normal and breath sounds normal. No respiratory distress. He has no wheezes. He has no rales.  Lymphadenopathy:    He has no cervical adenopathy.  Neurological: He is alert and oriented to person, place, and time. No cranial nerve deficit. Coordination normal.  No focal strength deficits Gait normal          Assessment & Plan:  #1 bilateral blurred vision. Possibly related to sertraline. This is reported as potential side effect but not common. We recommended he reduce his sertraline to 25 mg daily for 3 days then discontinue. If visual symptoms not fully resolved 1 week after stopping sertraline he is to see his ophthalmologist. He will also let us know. He does have some bilateral cataracts but these would not cause any acute worsening. #2 adjustment disorder with depressed mood. We have offered counseling in the past. Consider  Wellbutrin or non-serotonin medication if he has any recurrence of symptoms after stopping sertraline

## 2015-09-07 NOTE — Progress Notes (Signed)
Pre visit review using our clinic review tool, if applicable. No additional management support is needed unless otherwise documented below in the visit note. 

## 2015-09-07 NOTE — Patient Instructions (Signed)
HOLD the Sertraline for now. Let me know in one week if vision changes not better If not better in one week will recommend follow up with the ophthalmologist. We can look at another anti-depressant if mood is getting worse after stopping the Sertraline.

## 2015-09-12 ENCOUNTER — Other Ambulatory Visit: Payer: Self-pay | Admitting: Cardiovascular Disease

## 2015-09-12 MED ORDER — METOPROLOL SUCCINATE ER 25 MG PO TB24: 25.0000 mg | ORAL_TABLET | Freq: Every day | ORAL | Status: DC

## 2015-09-15 ENCOUNTER — Telehealth: Payer: Self-pay | Admitting: Family Medicine

## 2015-09-15 NOTE — Telephone Encounter (Signed)
Since stopping the Zoloft completely he has been doing well. His vision is good (he had his glasses adjusted) and he has been off of it since Sunday September 10, 2015.

## 2015-11-27 ENCOUNTER — Other Ambulatory Visit: Payer: Self-pay | Admitting: Family Medicine

## 2015-11-29 DIAGNOSIS — H26493 Other secondary cataract, bilateral: Secondary | ICD-10-CM | POA: Diagnosis not present

## 2015-12-11 DIAGNOSIS — H26491 Other secondary cataract, right eye: Secondary | ICD-10-CM | POA: Diagnosis not present

## 2015-12-19 DIAGNOSIS — H26492 Other secondary cataract, left eye: Secondary | ICD-10-CM | POA: Diagnosis not present

## 2016-01-09 DIAGNOSIS — C61 Malignant neoplasm of prostate: Secondary | ICD-10-CM | POA: Diagnosis not present

## 2016-01-15 ENCOUNTER — Other Ambulatory Visit (INDEPENDENT_AMBULATORY_CARE_PROVIDER_SITE_OTHER): Payer: Medicare Other | Admitting: *Deleted

## 2016-01-15 DIAGNOSIS — I2510 Atherosclerotic heart disease of native coronary artery without angina pectoris: Secondary | ICD-10-CM

## 2016-01-15 LAB — HEPATIC FUNCTION PANEL
ALT: 20 U/L (ref 9–46)
AST: 22 U/L (ref 10–35)
Albumin: 3.9 g/dL (ref 3.6–5.1)
Alkaline Phosphatase: 52 U/L (ref 40–115)
Bilirubin, Direct: 0.2 mg/dL (ref <=0.2)
Indirect Bilirubin: 0.5 mg/dL (ref 0.2–1.2)
Total Bilirubin: 0.7 mg/dL (ref 0.2–1.2)
Total Protein: 6.5 g/dL (ref 6.1–8.1)

## 2016-01-15 LAB — BASIC METABOLIC PANEL
BUN: 21 mg/dL (ref 7–25)
CO2: 26 mmol/L (ref 20–31)
Calcium: 9.3 mg/dL (ref 8.6–10.3)
Chloride: 103 mmol/L (ref 98–110)
Creat: 1.55 mg/dL — ABNORMAL HIGH (ref 0.70–1.18)
Glucose, Bld: 89 mg/dL (ref 65–99)
Potassium: 4.1 mmol/L (ref 3.5–5.3)
Sodium: 140 mmol/L (ref 135–146)

## 2016-01-15 LAB — LIPID PANEL
Cholesterol: 120 mg/dL — ABNORMAL LOW (ref 125–200)
HDL: 41 mg/dL (ref >=40)
LDL Cholesterol: 60 mg/dL (ref <130)
Total CHOL/HDL Ratio: 2.9 Ratio (ref <=5.0)
Triglycerides: 96 mg/dL (ref <150)
VLDL: 19 mg/dL (ref <30)

## 2016-01-15 NOTE — Addendum Note (Signed)
Addended by: Eulis Foster on: 01/15/2016 08:50 AM   Modules accepted: Orders

## 2016-01-15 NOTE — Addendum Note (Signed)
Addended by: Eulis Foster on: 01/15/2016 08:49 AM   Modules accepted: Orders

## 2016-01-16 DIAGNOSIS — Z8546 Personal history of malignant neoplasm of prostate: Secondary | ICD-10-CM | POA: Diagnosis not present

## 2016-01-16 DIAGNOSIS — N320 Bladder-neck obstruction: Secondary | ICD-10-CM | POA: Diagnosis not present

## 2016-01-16 DIAGNOSIS — Z8551 Personal history of malignant neoplasm of bladder: Secondary | ICD-10-CM | POA: Diagnosis not present

## 2016-01-18 ENCOUNTER — Encounter: Payer: Self-pay | Admitting: Cardiovascular Disease

## 2016-01-18 ENCOUNTER — Ambulatory Visit (INDEPENDENT_AMBULATORY_CARE_PROVIDER_SITE_OTHER): Payer: Medicare Other | Admitting: Cardiovascular Disease

## 2016-01-18 ENCOUNTER — Other Ambulatory Visit: Payer: Self-pay | Admitting: Cardiovascular Disease

## 2016-01-18 VITALS — BP 140/80 | HR 60 | Ht 67.0 in | Wt 148.4 lb

## 2016-01-18 DIAGNOSIS — I2510 Atherosclerotic heart disease of native coronary artery without angina pectoris: Secondary | ICD-10-CM | POA: Diagnosis not present

## 2016-01-18 DIAGNOSIS — E785 Hyperlipidemia, unspecified: Secondary | ICD-10-CM | POA: Diagnosis not present

## 2016-01-18 DIAGNOSIS — I10 Essential (primary) hypertension: Secondary | ICD-10-CM

## 2016-01-18 MED ORDER — NITROGLYCERIN 0.4 MG SL SUBL: 0.4000 mg | SUBLINGUAL_TABLET | SUBLINGUAL | Status: DC | PRN

## 2016-01-18 NOTE — Patient Instructions (Signed)

## 2016-01-18 NOTE — Progress Notes (Signed)
[name] Date of Birth  [birth year] [place] [address]    [place]   [place], [place]  [address]    [place], [place]  [address] [phone]  Fax  [phone]  [phone]  Fax [phone]  Problem list: 1. Coronary artery disease- status post remote PTCA to the left complex artery in 1991, CABG 2014 2. Hypertension 3. Hyperlipidemia 4. Prostate Cancer.  History of Present Illness:  [name] is  a 80 y.o. -year-old gentleman with a history of PTCA approximately 22 years ago.  He's not had any episodes of chest pain or shortness of breath. He stays fairly active although he doesn't exercise at the gym.  He has some problems with some arthritis-type pain in his right hip.  The Lipitor causes a little bit of muscular skeletal pain.  May 10, 2013:  He stopped hib Benicar 2 months ago. His BP has been running low at home.  He has been on a new medicine for his prostate and was concerned that his BP would run too low.   He has been taking a varied dose of benicar - 1 tab, 1/2 tab, 1/4 tablet whenever he thinks he needs it.   Sept. 5, 2014:  [name] presents today for worsening shortness of breath and chest pain.  Last Saturday, he mowed the lawn and spread 200 lbs of lime.  He developed severe CP and started to take some NTG.  The pain finally eased up. Several days later, he had recurrent CP while cleaning out the gutters.    He thinks he was just overheated.   He has been recording his BP and has been getting some low readings.     He has been having some hip pain and does not know if he will be able to walk on the treadmill.   Sept. 24, 2014: [name] was seen recently with some worsening chest pain. He had a stress Myoview study which was interpreted as low risk he has a fixed inferoapical defect.  He exertion, he is still having angina.  Lasts 5 minutes. In the center of his chest. No radiatioin.  Occurs only with  exertion ( stating mower or Rototiller- never walking around the house.)   Class II - III angina.    Yesterday, he had some mild baseline CP which worsened while walking around the grocery store.   Dec. 16, 2014:  [name] has had CABG since I last saw him in the office.  Still has chest tenderness - breathing is good, no CP  Chest wall is still tender.  May 11, 2014:  [name] is doing ok.   Jan. 4, 2015: Doing well  July 21, 2015:  Doing well from a cardiac standpoint  Still lonely ,  Mentally not where he needs to be since the death of his wife .   Visits with his 2 daughters frequently    Feb. 16, 2017:  Still grieving over the death of his wife , 16 months ago .   Were married 28 years.  Got some counseling at [facility] but was not as effective as he would like   Current Outpatient Prescriptions on File Prior to Visit  Medication Sig Dispense Refill  . aspirin 81 MG EC tablet Take 1 tablet (81 mg total) by mouth daily.    [name] atorvastatin (LIPITOR) 40 MG tablet Take 1 tablet (40 mg total) by mouth at bedtime. [place]  tablet 3  . metoprolol succinate (TOPROL-XL) 25 MG 24 hr tablet Take 1 tablet (25 mg total) by mouth daily. 90 tablet 1  . Multiple Vitamin (MULTIVITAMIN WITH MINERALS) TABS tablet Take 1 tablet by mouth daily.    . nitroGLYCERIN (NITROSTAT) 0.4 MG SL tablet Place 1 tablet (0.4 mg total) under the tongue every 5 (five) minutes as needed for chest pain. 25 tablet PRN  . pantoprazole (PROTONIX) 40 MG tablet Take 1 tablet (40 mg total) by mouth every other day. Takes in am 90 tablet 3  . polyethylene glycol (MIRALAX / GLYCOLAX) packet Take 17 g by mouth daily as needed (for constipation).      No current facility-administered medications on file prior to visit.    Allergies  Allergen Reactions  . Bee Venom Other (See Comments)    Reaction unknown  . Amoxicillin Rash  . Hydrocodone Other (See Comments)    REACTION: disorientation    Past Medical History  Diagnosis  Date  . History of radiation therapy 08/24/12-10/22/12    prostate 6600cGy/33 sessions--  for biochemical recurrence  . S/P CABG x 2     09/  2014  . Hyperlipidemia   . Benign positional vertigo   . Hypertension   . Coronary artery disease CARDIOLOGIST--  DR Acie Fredrickson  . GERD (gastroesophageal reflux disease)   . H/O hiatal hernia   . OA (osteoarthritis)   . S/P dilatation of esophageal stricture     1992  . History of anal fissures   . Urethral stricture   . SUI (stress urinary incontinence), male   . Recurrent prostate adenocarcinoma ([place]) first dx 09/2005--  radial prostatectomy-- gleason 4+3=7, pT2c, negative margin    04/2012--  BIOCHEMICAL  RECURRENCE to 0.33 /  SALVAGE IMRT SEPT to  NOV 2013  . ED (erectile dysfunction)   . History of colon polyps   . Sigmoid diverticulosis   . Bladder cancer ([place])   . At risk for sleep apnea     STOP-BANG= 6    SENT TO PCP 05-25-2014  . Wears glasses     Past Surgical History  Procedure Laterality Date  . Cystoscopy with urethral dilatation N/A 01/26/2014    Procedure: CYSTOSCOPY WITH URETHRAL DILATATION, BLADDER BIOPSY, FOLEY CATHETER;  Surgeon: Molli Hazard, MD;  [facility];  Service: Urology;  Laterality: N/A;  . Knee arthroscopy Right   . Lumbar disc surgery  1980'S  . Cardiovascular stress test  08-18-2013  DR NASHER    SMALL/ MEDIUM AREA MILD SCAR INFEROSEPTAL WALL/ NO ISCHEMIA/  LOW RISK SCAN/  EF 62%/  LV WALL MOTION WITH SLIGHT DECREASED OF THE SEPTUM  . Coronary angioplasty  1991    PTCA  of LEFT CFX  . Cardiac catheterization  08-27-2013  DR Martinique    CRITICAL OSTIAL LM STENOSIS/  diffuse disease LAD 40-50%  &   pLCx 30-40%/  . Coronary artery bypass graft N/A 08/27/2013    Procedure: CORONARY ARTERY BYPASS GRAFTING (CABG) times two using left internal mammary and right saphenous vein using endoscope.;  Surgeon: Melrose Nakayama, MD;  Location: [place];  Service: Open Heart Surgery;  Laterality: N/A;  . Inguinal  hernia repair Bilateral 1994  . Radical retropubic prostatectomy w/ bilateral pelvic node dissection  11-18-2005  . Urethrotomy N/A 05/30/2014    Procedure: CYSTOSCOPY  BALLOON DILATION AND DIRECT VISION INTERNAL URETHROTOMY;  Surgeon: Sharyn Creamer, MD;  [facility];  Service: Urology;  Laterality: N/A;  .  Left heart catheterization with coronary angiogram N/A 08/27/2013    Procedure: LEFT HEART CATHETERIZATION WITH CORONARY ANGIOGRAM;  Surgeon: Peter M Martinique, MD;  Location: [facility] CATH LAB;  Service: Cardiovascular;  Laterality: N/A;    History  Smoking status  . Former Smoker -- 2.00 packs/day for 10 years  . Types: Cigarettes  . Quit date: 12/03/1994  Smokeless tobacco  . Never Used    History  Alcohol Use  . Yes    Comment: occasionally    Family History  Problem Relation Age of Onset  . Cancer Father     prostate died 49 something  . Arthritis Other   . Hyperlipidemia Other   . Hypertension Other   . Cancer Brother     prostate cancer    Reviw of Systems:  Reviewed in the HPI.  All other systems are negative.  Physical Exam: Blood pressure 140/80, pulse 60, height 5\' 7"  (1.702 m), weight 148 lb 6.4 oz (67.314 kg). General: Well developed, well nourished, in no acute distress. Head: Normocephalic, atraumatic, sclera non-icteric, mucus membranes are moist,  Neck: Supple. Negative for carotid bruits. JVD not elevated. Lungs: Clear bilaterally to auscultation without wheezes, rales, or rhonchi. Breathing is unlabored. Heart: RRR with S1 S2. No murmurs, rubs, or gallops appreciated. Abdomen: Soft, non-tender, non-distended with normoactive bowel sounds.  + pulsitile mass c/w small abdominal aneurism.  Msk:  Strength and tone appear normal for age. Extremities: No clubbing or cyanosis. No edema.  Distal pedal pulses are 2+ and equal bilaterally. Neuro: Alert and oriented X 3. Moves all extremities spontaneously. Psych:  Responds to questions  appropriately with a normal affect.  ECG: Jan. 4, 2016:  NSR at 63, normal   Assessment / Plan:   1. Coronary artery disease- status post remote PTCA to the left complex artery in 1991, CABG 2014, he is doing well. No angina.  2. Hypertension - BP is well controlled  3. Hyperlipidemia- lipids are well conrolled  4. Prostate Cancer. 5. Abdominal aortic aneurism  -  Abdominal US  Sept. 2015 IMPRESSION: Distal abdominal aortic aneurysm with maximum transverse diameter 3.3 x 3.8 cm. Aneurysm measures 4.7 cm in length. his medical doctor does annual ultrasounds   Nahser, Wonda Cheng, MD  01/18/2016 11:46 AM    [address],  [place], [place]  [address] Pager [phone] Phone: [phone]; Fax: [phone]   [facility]  [address] [place], [place]  [address] [phone]   Fax [phone]

## 2016-01-19 ENCOUNTER — Telehealth: Payer: Self-pay | Admitting: Cardiovascular Disease

## 2016-01-19 NOTE — Telephone Encounter (Signed)
Left a message for the pt to call back.  

## 2016-01-19 NOTE — Telephone Encounter (Signed)
Pt calling to go over his recent lab work from 01/15/16.  Informed the pt of his lab results per Dr Acie Fredrickson, as mentioned below:  Notes Recorded by Thayer Headings, MD on 01/16/2016 at 8:23 AM Lipids, liver, and BMP are stable   Pt verbalized understanding and gracious for all the assistance provided.

## 2016-01-19 NOTE — Telephone Encounter (Signed)
New Message  Pt requerting to speak w/ RN concerning his lab work from 2/13- did not discuss w. Dr Acie Fredrickson; Please call back and discuss.

## 2016-01-21 ENCOUNTER — Other Ambulatory Visit: Payer: Self-pay | Admitting: Cardiovascular Disease

## 2016-02-05 ENCOUNTER — Other Ambulatory Visit: Payer: Self-pay | Admitting: Cardiovascular Disease

## 2016-02-12 ENCOUNTER — Ambulatory Visit (INDEPENDENT_AMBULATORY_CARE_PROVIDER_SITE_OTHER): Payer: Medicare Other | Admitting: Family Medicine

## 2016-02-12 VITALS — BP 140/78 | HR 70 | Temp 98.8°F | Ht 67.0 in | Wt 147.8 lb

## 2016-02-12 DIAGNOSIS — I2510 Atherosclerotic heart disease of native coronary artery without angina pectoris: Secondary | ICD-10-CM | POA: Diagnosis not present

## 2016-02-12 DIAGNOSIS — B349 Viral infection, unspecified: Secondary | ICD-10-CM

## 2016-02-12 MED ORDER — SERTRALINE HCL 50 MG PO TABS: 50.0000 mg | ORAL_TABLET | Freq: Every day | ORAL | Status: DC

## 2016-02-12 NOTE — Progress Notes (Signed)
Subjective:    Patient ID: [name], male    DOB: 1936/11/22, 80 y.o.   MRN: [id number]  HPI  Acute visit.  Onset 1 week ago of body aches, chills, productive cough.  Slightly better today. No dyspnea. No wheezing.  Denies any nausea or vomiting.  Cough has been relatively mild. He's been taking Benadryl at night. Quit smoking 1996.  Past Medical History  Diagnosis Date  . History of radiation therapy 08/24/12-10/22/12    prostate 6600cGy/33 sessions--  for biochemical recurrence  . S/P CABG x 2     09/  2014  . Hyperlipidemia   . Benign positional vertigo   . Hypertension   . Coronary artery disease CARDIOLOGIST--  DR Acie Fredrickson  . GERD (gastroesophageal reflux disease)   . H/O hiatal hernia   . OA (osteoarthritis)   . S/P dilatation of esophageal stricture     1992  . History of anal fissures   . Urethral stricture   . SUI (stress urinary incontinence), male   . Recurrent prostate adenocarcinoma ([place]) first dx 09/2005--  radial prostatectomy-- gleason 4+3=7, pT2c, negative margin    04/2012--  BIOCHEMICAL  RECURRENCE to 0.33 /  SALVAGE IMRT SEPT to  NOV 2013  . ED (erectile dysfunction)   . History of colon polyps   . Sigmoid diverticulosis   . Bladder cancer ([place])   . At risk for sleep apnea     STOP-BANG= 6    SENT TO PCP 05-25-2014  . Wears glasses    Past Surgical History  Procedure Laterality Date  . Cystoscopy with urethral dilatation N/A 01/26/2014    Procedure: CYSTOSCOPY WITH URETHRAL DILATATION, BLADDER BIOPSY, FOLEY CATHETER;  Surgeon: Molli Hazard, MD;  [facility];  Service: Urology;  Laterality: N/A;  . Knee arthroscopy Right   . Lumbar disc surgery  1980'S  . Cardiovascular stress test  08-18-2013  DR NASHER    SMALL/ MEDIUM AREA MILD SCAR INFEROSEPTAL WALL/ NO ISCHEMIA/  LOW RISK SCAN/  EF 62%/  LV WALL MOTION WITH SLIGHT DECREASED OF THE SEPTUM  . Coronary angioplasty  1991    PTCA  of LEFT CFX  . Cardiac catheterization   08-27-2013  DR Martinique    CRITICAL OSTIAL LM STENOSIS/  diffuse disease LAD 40-50%  &   pLCx 30-40%/  . Coronary artery bypass graft N/A 08/27/2013    Procedure: CORONARY ARTERY BYPASS GRAFTING (CABG) times two using left internal mammary and right saphenous vein using endoscope.;  Surgeon: Melrose Nakayama, MD;  Location: [place];  Service: Open Heart Surgery;  Laterality: N/A;  . Inguinal hernia repair Bilateral 1994  . Radical retropubic prostatectomy w/ bilateral pelvic node dissection  11-18-2005  . Urethrotomy N/A 05/30/2014    Procedure: CYSTOSCOPY  BALLOON DILATION AND DIRECT VISION INTERNAL URETHROTOMY;  Surgeon: Sharyn Creamer, MD;  Location: [facility];  Service: Urology;  Laterality: N/A;  . Left heart catheterization with coronary angiogram N/A 08/27/2013    Procedure: LEFT HEART CATHETERIZATION WITH CORONARY ANGIOGRAM;  Surgeon: Peter M Martinique, MD;  Location: [facility] CATH LAB;  Service: Cardiovascular;  Laterality: N/A;    reports that he quit smoking about 21 years ago. His smoking use included Cigarettes. He has a 20 pack-year smoking history. He has never used smokeless tobacco. He reports that he drinks alcohol. He reports that he does not use illicit drugs. family history includes Arthritis in his other; Cancer in his brother and father; Hyperlipidemia in  his other; Hypertension in his other. Allergies  Allergen Reactions  . Bee Venom Other (See Comments)    Reaction unknown  . Amoxicillin Rash  . Hydrocodone Other (See Comments)    REACTION: disorientation      Review of Systems  Constitutional: Positive for chills and fatigue. Negative for fever.  HENT: Positive for congestion.   Respiratory: Positive for cough. Negative for shortness of breath and wheezing.        Objective:   Physical Exam  Constitutional: He appears well-developed and well-nourished.  HENT:  Right Ear: External ear normal.  Left Ear: External ear normal.  Mouth/Throat:  Oropharynx is clear and moist.  Neck: Neck supple.  Cardiovascular: Normal rate and regular rhythm.   Pulmonary/Chest: Effort normal and breath sounds normal. No respiratory distress. He has no wheezes. He has no rales.  Lymphadenopathy:    He has no cervical adenopathy.          Assessment & Plan:   Probable viral syndrome. Nonfocal exam. Try over-the-counter Mucinex. Stay hydrated. Follow-up for any fever or increased shortness of breath

## 2016-02-12 NOTE — Patient Instructions (Signed)
Consider over the counter Mucinex for cough. May also use Tylenol if needed Stay well hydrated.

## 2016-02-12 NOTE — Progress Notes (Signed)
Pre visit review using our clinic review tool, if applicable. No additional management support is needed unless otherwise documented below in the visit note. 

## 2016-02-23 ENCOUNTER — Encounter: Payer: Self-pay | Admitting: Family Medicine

## 2016-02-23 ENCOUNTER — Ambulatory Visit (INDEPENDENT_AMBULATORY_CARE_PROVIDER_SITE_OTHER): Payer: Medicare Other | Admitting: Family Medicine

## 2016-02-23 VITALS — BP 140/80 | HR 77 | Temp 98.1°F | Ht 67.0 in | Wt 148.5 lb

## 2016-02-23 DIAGNOSIS — R059 Cough, unspecified: Secondary | ICD-10-CM

## 2016-02-23 DIAGNOSIS — I2510 Atherosclerotic heart disease of native coronary artery without angina pectoris: Secondary | ICD-10-CM | POA: Diagnosis not present

## 2016-02-23 DIAGNOSIS — H9201 Otalgia, right ear: Secondary | ICD-10-CM | POA: Diagnosis not present

## 2016-02-23 DIAGNOSIS — R05 Cough: Secondary | ICD-10-CM | POA: Diagnosis not present

## 2016-02-23 MED ORDER — BENZONATATE 200 MG PO CAPS
200.0000 mg | ORAL_CAPSULE | Freq: Three times a day (TID) | ORAL | Status: DC | PRN
Start: 2016-02-23 — End: 2016-03-13

## 2016-02-23 NOTE — Progress Notes (Signed)
Pre visit review using our clinic review tool, if applicable. No additional management support is needed unless otherwise documented below in the visit note. 

## 2016-02-23 NOTE — Progress Notes (Signed)
Subjective:    Patient ID: SAMIUL ACREY, male    DOB: September 02, 1936, 80 y.o.   MRN: [id number]  HPI   Recent viral syndrome. Now presents with three-day history of right otalgia. No drainage. No hearing changes. Nasal congestion has improved. Still has dry cough, now 2 week duration. No hemoptysis. No dyspnea. No wheezing.  Has tried over-the-counter medications including Sudafed, Tylenol, Robitussin. Cough not improved with Robitussin. Ibuprofen helped his ear pain.  Past Medical History  Diagnosis Date  . History of radiation therapy 08/24/12-10/22/12    prostate 6600cGy/33 sessions--  for biochemical recurrence  . S/P CABG x 2     09/  2014  . Hyperlipidemia   . Benign positional vertigo   . Hypertension   . Coronary artery disease CARDIOLOGIST--  DR Acie Fredrickson  . GERD (gastroesophageal reflux disease)   . H/O hiatal hernia   . OA (osteoarthritis)   . S/P dilatation of esophageal stricture     1992  . History of anal fissures   . Urethral stricture   . SUI (stress urinary incontinence), male   . Recurrent prostate adenocarcinoma ([place]) first dx 09/2005--  radial prostatectomy-- gleason 4+3=7, pT2c, negative margin    04/2012--  BIOCHEMICAL  RECURRENCE to 0.33 /  SALVAGE IMRT SEPT to  NOV 2013  . ED (erectile dysfunction)   . History of colon polyps   . Sigmoid diverticulosis   . Bladder cancer ([place])   . At risk for sleep apnea     STOP-BANG= 6    SENT TO PCP 05-25-2014  . Wears glasses    Past Surgical History  Procedure Laterality Date  . Cystoscopy with urethral dilatation N/A 01/26/2014    Procedure: CYSTOSCOPY WITH URETHRAL DILATATION, BLADDER BIOPSY, FOLEY CATHETER;  Surgeon: Molli Hazard, MD;  [facility];  Service: Urology;  Laterality: N/A;  . Knee arthroscopy Right   . Lumbar disc surgery  1980'S  . Cardiovascular stress test  08-18-2013  DR NASHER    SMALL/ MEDIUM AREA MILD SCAR INFEROSEPTAL WALL/ NO ISCHEMIA/  LOW RISK SCAN/  EF 62%/  LV WALL MOTION  WITH SLIGHT DECREASED OF THE SEPTUM  . Coronary angioplasty  1991    PTCA  of LEFT CFX  . Cardiac catheterization  08-27-2013  DR Martinique    CRITICAL OSTIAL LM STENOSIS/  diffuse disease LAD 40-50%  &   pLCx 30-40%/  . Coronary artery bypass graft N/A 08/27/2013    Procedure: CORONARY ARTERY BYPASS GRAFTING (CABG) times two using left internal mammary and right saphenous vein using endoscope.;  Surgeon: Melrose Nakayama, MD;  Location: [place];  Service: Open Heart Surgery;  Laterality: N/A;  . Inguinal hernia repair Bilateral 1994  . Radical retropubic prostatectomy w/ bilateral pelvic node dissection  11-18-2005  . Urethrotomy N/A 05/30/2014    Procedure: CYSTOSCOPY  BALLOON DILATION AND DIRECT VISION INTERNAL URETHROTOMY;  Surgeon: Sharyn Creamer, MD;  Location[facility];  Service: Urology;  Laterality: N/A;  . Left heart catheterization with coronary angiogram N/A 08/27/2013    Procedure: LEFT HEART CATHETERIZATION WITH CORONARY ANGIOGRAM;  Surgeon: Peter M Martinique, MD;  Location: [facility] CATH LAB;  Service: Cardiovascular;  Laterality: N/A;    reports that he quit smoking about 21 years ago. His smoking use included Cigarettes. He has a 20 pack-year smoking history. He has never used smokeless tobacco. He reports that he drinks alcohol. He reports that he does not use illicit drugs. family history includes  Arthritis in his other; Cancer in his brother and father; Hyperlipidemia in his other; Hypertension in his other. Allergies  Allergen Reactions  . Bee Venom Other (See Comments)    Reaction unknown  . Amoxicillin Rash  . Hydrocodone Other (See Comments)    REACTION: disorientation     Review of Systems  Constitutional: Negative for fever and chills.  HENT: Negative for congestion.   Respiratory: Positive for cough. Negative for shortness of breath and wheezing.   Cardiovascular: Negative for chest pain.       Objective:   Physical Exam  Constitutional: He  appears well-developed and well-nourished. No distress.  HENT:  Right Ear: External ear normal.  Left Ear: External ear normal.  Mouth/Throat: Oropharynx is clear and moist.  Initially had some cerumen right canal removed with curette. Eardrum appears normal  Neck: Neck supple.  Cardiovascular: Normal rate and regular rhythm.   Pulmonary/Chest: Effort normal and breath sounds normal. No respiratory distress. He has no wheezes. He has no rales.  Lymphadenopathy:    He has no cervical adenopathy.          Assessment & Plan:   #1 right otalgia. Normal exam. Reassurance.  #2 cough. Nonfocal exam. Suspect recent acute viral bronchitis. Tessalon Perles 2 mg every 8 hours as needed. Previous intolerance with hydrocodone.

## 2016-02-23 NOTE — Patient Instructions (Signed)
Touch base in 2 weeks if cough no better Sooner for any fever or increased shortness of breath.

## 2016-03-05 ENCOUNTER — Other Ambulatory Visit: Payer: Self-pay | Admitting: Cardiovascular Disease

## 2016-03-13 ENCOUNTER — Encounter: Payer: Self-pay | Admitting: Family Medicine

## 2016-03-13 ENCOUNTER — Ambulatory Visit (INDEPENDENT_AMBULATORY_CARE_PROVIDER_SITE_OTHER)
Admission: RE | Admit: 2016-03-13 | Discharge: 2016-03-13 | Disposition: A | Discharge status: Home / Self Care | Payer: Medicare Other | Source: Ambulatory Visit | Attending: Family Medicine | Admitting: Family Medicine

## 2016-03-13 ENCOUNTER — Ambulatory Visit (INDEPENDENT_AMBULATORY_CARE_PROVIDER_SITE_OTHER): Payer: Medicare Other | Admitting: Family Medicine

## 2016-03-13 VITALS — BP 138/80 | HR 66 | Temp 97.8°F | Ht 67.0 in | Wt 147.6 lb

## 2016-03-13 DIAGNOSIS — R059 Cough, unspecified: Secondary | ICD-10-CM

## 2016-03-13 DIAGNOSIS — I2510 Atherosclerotic heart disease of native coronary artery without angina pectoris: Secondary | ICD-10-CM | POA: Diagnosis not present

## 2016-03-13 DIAGNOSIS — R05 Cough: Secondary | ICD-10-CM

## 2016-03-13 NOTE — Progress Notes (Signed)
Pre visit review using our clinic review tool, if applicable. No additional management support is needed unless otherwise documented below in the visit note. 

## 2016-03-13 NOTE — Progress Notes (Signed)
Subjective:    Patient ID: [name], male    DOB: 1936/11/18, 80 y.o.   MRN: [id number]  HPI  patient seen with persistent cough.  Onset about a month ago with typical viral type symptoms.  Cough is mostly dry. Occasionally coughs up clear mucus.  Describes a frequent "tickle "back of throat.  Took some Tessalon without relief. Delsym does help.  Denies any associated appetite change, weight change, fever, shortness of breath, or hemoptysis. Not aware of any recent wheezing   Does have history of GERD and takes Protonix every other day.  No postnasal drip symptoms.  Quit smoking 1996. Allergy to hydrocodone.  Past Medical History  Diagnosis Date  . History of radiation therapy 08/24/12-10/22/12    prostate 6600cGy/33 sessions--  for biochemical recurrence  . S/P CABG x 2     09/  2014  . Hyperlipidemia   . Benign positional vertigo   . Hypertension   . Coronary artery disease CARDIOLOGIST--  DR Acie Fredrickson  . GERD (gastroesophageal reflux disease)   . H/O hiatal hernia   . OA (osteoarthritis)   . S/P dilatation of esophageal stricture     1992  . History of anal fissures   . Urethral stricture   . SUI (stress urinary incontinence), male   . Recurrent prostate adenocarcinoma ([place]) first dx 09/2005--  radial prostatectomy-- gleason 4+3=7, pT2c, negative margin    04/2012--  BIOCHEMICAL  RECURRENCE to 0.33 /  SALVAGE IMRT SEPT to  NOV 2013  . ED (erectile dysfunction)   . History of colon polyps   . Sigmoid diverticulosis   . Bladder cancer ([place])   . At risk for sleep apnea     STOP-BANG= 6    SENT TO PCP 05-25-2014  . Wears glasses    Past Surgical History  Procedure Laterality Date  . Cystoscopy with urethral dilatation N/A 01/26/2014    Procedure: CYSTOSCOPY WITH URETHRAL DILATATION, BLADDER BIOPSY, FOLEY CATHETER;  Surgeon: Molli Hazard, MD;  [facility];  Service: Urology;  Laterality: N/A;  . Knee arthroscopy Right   . Lumbar disc surgery  1980'S    . Cardiovascular stress test  08-18-2013  DR NASHER    SMALL/ MEDIUM AREA MILD SCAR INFEROSEPTAL WALL/ NO ISCHEMIA/  LOW RISK SCAN/  EF 62%/  LV WALL MOTION WITH SLIGHT DECREASED OF THE SEPTUM  . Coronary angioplasty  1991    PTCA  of LEFT CFX  . Cardiac catheterization  08-27-2013  DR Martinique    CRITICAL OSTIAL LM STENOSIS/  diffuse disease LAD 40-50%  &   pLCx 30-40%/  . Coronary artery bypass graft N/A 08/27/2013    Procedure: CORONARY ARTERY BYPASS GRAFTING (CABG) times two using left internal mammary and right saphenous vein using endoscope.;  Surgeon: Melrose Nakayama, MD;  Location: [facility];  Service: Open Heart Surgery;  Laterality: N/A;  . Inguinal hernia repair Bilateral 1994  . Radical retropubic prostatectomy w/ bilateral pelvic node dissection  11-18-2005  . Urethrotomy N/A 05/30/2014    Procedure: CYSTOSCOPY  BALLOON DILATION AND DIRECT VISION INTERNAL URETHROTOMY;  Surgeon: Sharyn Creamer, MD;  Location: [facility];  Service: Urology;  Laterality: N/A;  . Left heart catheterization with coronary angiogram N/A 08/27/2013    Procedure: LEFT HEART CATHETERIZATION WITH CORONARY ANGIOGRAM;  Surgeon: Peter M Martinique, MD;  Location: [facility] CATH LAB;  Service: Cardiovascular;  Laterality: N/A;    reports that he quit smoking about 21 years ago. His  smoking use included Cigarettes. He has a 20 pack-year smoking history. He has never used smokeless tobacco. He reports that he drinks alcohol. He reports that he does not use illicit drugs. family history includes Arthritis in his other; Cancer in his brother and father; Hyperlipidemia in his other; Hypertension in his other. Allergies  Allergen Reactions  . Bee Venom Other (See Comments)    Reaction unknown  . Amoxicillin Rash  . Hydrocodone Other (See Comments)    REACTION: disorientation      Review of Systems  Constitutional: Negative for fever, chills, appetite change and unexpected weight change.  HENT: Negative  for congestion and postnasal drip.   Respiratory: Positive for cough. Negative for shortness of breath and wheezing.        Objective:   Physical Exam  Constitutional: He appears well-developed and well-nourished.  HENT:  Mouth/Throat: Oropharynx is clear and moist.  Neck: Neck supple.  Cardiovascular: Normal rate and regular rhythm.   Pulmonary/Chest: Effort normal and breath sounds normal. No respiratory distress. He has no wheezes. He has no rales.  Lymphadenopathy:    He has no cervical adenopathy.          Assessment & Plan:  Persistent cough now for about 4 weeks in an ex-smoker with recent viral URI type symptoms. Non-focal exam. No red flags such as fever, dyspnea, hemoptysis, appetite or weight change. Suspect post viral bronchitis. Does have history of GERD. Increase Protonix to one daily. Continue Delsym as needed. Chest x-ray given duration of symptoms. Touch base if cough not resolving in 2 weeks

## 2016-03-13 NOTE — Patient Instructions (Signed)
Increase Protonix to one DAILY Consider adding OTC Pepcid or Zantac. Go for CXR at [facility] office. Continue with Delsym as needed. Follow up for any shortness of breath, fever, or coughing up blood.

## 2016-05-02 ENCOUNTER — Telehealth: Payer: Self-pay | Admitting: Family Medicine

## 2016-05-02 NOTE — Telephone Encounter (Signed)
Received a fax from [facility] for pa on Protonix.  Initiated on CoverMyMeds and received a favorable outcome.  Informed the pt and [facility] the pa was approved.

## 2016-06-14 DIAGNOSIS — H40013 Open angle with borderline findings, low risk, bilateral: Secondary | ICD-10-CM | POA: Diagnosis not present

## 2016-07-17 ENCOUNTER — Encounter: Payer: Self-pay | Admitting: Cardiovascular Disease

## 2016-07-17 ENCOUNTER — Ambulatory Visit (INDEPENDENT_AMBULATORY_CARE_PROVIDER_SITE_OTHER): Payer: Medicare Other | Admitting: Cardiovascular Disease

## 2016-07-17 VITALS — BP 110/70 | HR 56 | Ht 67.0 in | Wt 144.0 lb

## 2016-07-17 DIAGNOSIS — I2510 Atherosclerotic heart disease of native coronary artery without angina pectoris: Secondary | ICD-10-CM | POA: Diagnosis not present

## 2016-07-17 NOTE — Patient Instructions (Signed)

## 2016-07-17 NOTE — Progress Notes (Signed)
[name] Date of Birth  [birth year] [place] [address]    [place], [place]  [address]    [place],   [address] [phone]  Fax  [phone]  [phone]  Fax [phone]  Problem list: 1. Coronary artery disease- status post remote PTCA to the left complex artery in 1991, CABG 2014 2. Hypertension 3. Hyperlipidemia 4. Prostate Cancer.  History of Present Illness:  [name] is  a 80 y.o. -year-old gentleman with a history of PTCA approximately 22 years ago.  He's not had any episodes of chest pain or shortness of breath. He stays fairly active although he doesn't exercise at the gym.  He has some problems with some arthritis-type pain in his right hip.  The Lipitor causes a little bit of muscular skeletal pain.  May 10, 2013:  He stopped hib Benicar 2 months ago. His BP has been running low at home.  He has been on a new medicine for his prostate and was concerned that his BP would run too low.   He has been taking a varied dose of benicar - 1 tab, 1/2 tab, 1/4 tablet whenever he thinks he needs it.   Sept. 5, 2014:  [name] presents today for worsening shortness of breath and chest pain.  Last Saturday, he mowed the lawn and spread 200 lbs of lime.  He developed severe CP and started to take some NTG.  The pain finally eased up. Several days later, he had recurrent CP while cleaning out the gutters.    He thinks he was just overheated.   He has been recording his BP and has been getting some low readings.     He has been having some hip pain and does not know if he will be able to walk on the treadmill.   Sept. 24, 2014: [name] was seen recently with some worsening chest pain. He had a stress Myoview study which was interpreted as low risk he has a fixed inferoapical defect.  He exertion, he is still having angina.  Lasts 5 minutes. In the center of his chest. No radiatioin.  Occurs only with  exertion ( stating mower or Rototiller- never walking around the house.)   Class II - III angina.    Yesterday, he had some mild baseline CP which worsened while walking around the grocery store.   Dec. 16, 2014:  [name] has had CABG since I last saw him in the office.  Still has chest tenderness - breathing is good, no CP  Chest wall is still tender.  May 11, 2014:  [name] is doing ok.   Jan. 4, 2015: Doing well  July 21, 2015:  Doing well from a cardiac standpoint  Still lonely ,  Mentally not where he needs to be since the death of his wife .   Visits with his 2 daughters frequently    Feb. 16, 2017:  Still grieving over the death of his wife , 16 months ago .   Were married 69 years.  Got some [facility] but was not as effective as he would like    Aug. 16, 2017: No CP or dyspnea  Has easy bruising on his arms due to the ASA walks some on the treadmill  No CP    Current Outpatient Prescriptions on File Prior to Visit  Medication Sig Dispense Refill  . aspirin 81 MG EC  tablet Take 1 tablet (81 mg total) by mouth daily.    [name] atorvastatin (LIPITOR) 40 MG tablet TAKE 1 TABLET AT BEDTIME 90 tablet 1  . metoprolol succinate (TOPROL-XL) 25 MG 24 hr tablet TAKE 1 TABLET BY MOUTH EVERY DAY 90 tablet 2  . Multiple Vitamin (MULTIVITAMIN WITH MINERALS) TABS tablet Take 1 tablet by mouth daily.    . nitroGLYCERIN (NITROSTAT) 0.4 MG SL tablet PLACE 1 TABLET UNDER THE TONUE EVERY 5 MINUTES AS NEEDED FOR CHEST PAIN. 225 tablet 0  . pantoprazole (PROTONIX) 40 MG tablet Take 1 tablet (40 mg total) by mouth every other day. Takes in am 90 tablet 3  . polyethylene glycol (MIRALAX / GLYCOLAX) packet Take 17 g by mouth daily as needed (for constipation).      No current facility-administered medications on file prior to visit.     Allergies  Allergen Reactions  . Bee Venom Other (See Comments)    Reaction unknown  . Amoxicillin Rash  . Hydrocodone Other (See Comments)     REACTION: disorientation    Past Medical History:  Diagnosis Date  . At risk for sleep apnea    STOP-BANG= 6    SENT TO PCP 05-25-2014  . Benign positional vertigo   . Bladder cancer ([place])   . Coronary artery disease CARDIOLOGIST--  DR Acie Fredrickson  . ED (erectile dysfunction)   . GERD (gastroesophageal reflux disease)   . H/O hiatal hernia   . History of anal fissures   . History of colon polyps   . History of radiation therapy 08/24/12-10/22/12   prostate 6600cGy/33 sessions--  for biochemical recurrence  . Hyperlipidemia   . Hypertension   . OA (osteoarthritis)   . Recurrent prostate adenocarcinoma ([place]) first dx 09/2005--  radial prostatectomy-- gleason 4+3=7, pT2c, negative margin   04/2012--  BIOCHEMICAL  RECURRENCE to 0.33 /  SALVAGE IMRT SEPT to  NOV 2013  . S/P CABG x 2    09/  2014  . S/P dilatation of esophageal stricture    1992  . Sigmoid diverticulosis   . SUI (stress urinary incontinence), male   . Urethral stricture   . Wears glasses     Past Surgical History:  Procedure Laterality Date  . CARDIAC CATHETERIZATION  08-27-2013  DR Martinique   CRITICAL OSTIAL LM STENOSIS/  diffuse disease LAD 40-50%  &   pLCx 30-40%/  . CARDIOVASCULAR STRESS TEST  08-18-2013  DR NASHER   SMALL/ MEDIUM AREA MILD SCAR INFEROSEPTAL WALL/ NO ISCHEMIA/  LOW RISK SCAN/  EF 62%/  LV WALL MOTION WITH SLIGHT DECREASED OF THE SEPTUM  . CORONARY ANGIOPLASTY  1991   PTCA  of LEFT CFX  . CORONARY ARTERY BYPASS GRAFT N/A 08/27/2013   Procedure: CORONARY ARTERY BYPASS GRAFTING (CABG) times two using left internal mammary and right saphenous vein using endoscope.;  Surgeon: Melrose Nakayama, MD;  Location: [facility];  Service: Open Heart Surgery;  Laterality: N/A;  . CYSTOSCOPY WITH URETHRAL DILATATION N/A 01/26/2014   Procedure: CYSTOSCOPY WITH URETHRAL DILATATION, BLADDER BIOPSY, FOLEY CATHETER;  Surgeon: Molli Hazard, MD;  Location: [facility];  Service: Urology;  Laterality: N/A;  . INGUINAL  HERNIA REPAIR Bilateral 1994  . KNEE ARTHROSCOPY Right   . LEFT HEART CATHETERIZATION WITH CORONARY ANGIOGRAM N/A 08/27/2013   Procedure: LEFT HEART CATHETERIZATION WITH CORONARY ANGIOGRAM;  Surgeon: Peter M Martinique, MD;  Location[facility] C[facility] LAB;  Service: Cardiovascular;  Laterality: N/A;  . LUMBAR DISC SURGERY  1980'S  .  RADICAL RETROPUBIC PROSTATECTOMY W/ BILATERAL PELVIC NODE DISSECTION  11-18-2005  . URETHROTOMY N/A 05/30/2014   Procedure: CYSTOSCOPY  BALLOON DILATION AND DIRECT VISION INTERNAL URETHROTOMY;  Surgeon: Sharyn Creamer, MD;  Location[facility];  Service: Urology;  Laterality: N/A;    History  Smoking Status  . Former Smoker  . Packs/day: 2.00  . Years: 10.00  . Types: Cigarettes  . Quit date: 12/03/1994  Smokeless Tobacco  . Never Used    History  Alcohol Use  . Yes    Comment: occasionally    Family History  Problem Relation Age of Onset  . Cancer Father     prostate died 37 something  . Cancer Brother     prostate cancer  . Arthritis Other   . Hyperlipidemia Other   . Hypertension Other     Reviw of Systems:  Reviewed in the HPI.  All other systems are negative.  Physical Exam: Blood pressure 110/70, pulse (!) 56, height 5\' 7"  (1.702 m), weight 144 lb (65.3 kg). General: Well developed, well nourished, in no acute distress. Head: Normocephalic, atraumatic, sclera non-icteric, mucus membranes are moist,  Neck: Supple. Negative for carotid bruits. JVD not elevated. Lungs: Clear bilaterally to auscultation without wheezes, rales, or rhonchi. Breathing is unlabored. Heart: RRR with S1 S2. No murmurs, rubs, or gallops appreciated. Abdomen: Soft, non-tender, non-distended with normoactive bowel sounds.  + pulsitile mass c/w small abdominal aneurism.  Msk:  Strength and tone appear normal for age. Extremities: No clubbing or cyanosis. No edema.  Distal pedal pulses are 2+ and equal bilaterally. Neuro: Alert and oriented X 3. Moves all  extremities spontaneously. Psych:  Responds to questions appropriately with a normal affect.  ECG: Aug. 16, 2017:   Sinus brady at 56.   No ST or T wave changes.    Assessment / Plan:   1. Coronary artery disease- status post remote PTCA to the left complex artery in 1991, CABG 2014, he is doing well. No angina.  2. Hypertension - BP is well controlled  3. Hyperlipidemia- lipids are well conrolled  4. Prostate Cancer. 5. Abdominal aortic aneurism  -  The AAA is stable by [place] September, 2016.   Due to recheck in several months   He seems depressed.   Mertie Moores, MD  07/17/2016 12:11 PM    [address],  [place], [place]  [address] Pager [phone] Phone: [phone]; Fax: [phone]   [facility]  [address] [place],   [address] [phone]   Fax [phone]

## 2016-07-30 DIAGNOSIS — H1013 Acute atopic conjunctivitis, bilateral: Secondary | ICD-10-CM | POA: Diagnosis not present

## 2016-08-12 ENCOUNTER — Encounter: Payer: Medicare Other | Admitting: Family Medicine

## 2016-08-12 ENCOUNTER — Encounter: Payer: Self-pay | Admitting: Family Medicine

## 2016-08-12 ENCOUNTER — Ambulatory Visit (INDEPENDENT_AMBULATORY_CARE_PROVIDER_SITE_OTHER): Payer: Medicare Other | Admitting: Family Medicine

## 2016-08-12 VITALS — BP 140/78 | HR 66 | Temp 97.9°F | Ht 67.0 in | Wt 142.5 lb

## 2016-08-12 DIAGNOSIS — Z23 Encounter for immunization: Secondary | ICD-10-CM | POA: Diagnosis not present

## 2016-08-12 DIAGNOSIS — I10 Essential (primary) hypertension: Secondary | ICD-10-CM

## 2016-08-12 DIAGNOSIS — K219 Gastro-esophageal reflux disease without esophagitis: Secondary | ICD-10-CM

## 2016-08-12 DIAGNOSIS — I25810 Atherosclerosis of coronary artery bypass graft(s) without angina pectoris: Secondary | ICD-10-CM

## 2016-08-12 DIAGNOSIS — I714 Abdominal aortic aneurysm, without rupture, unspecified: Secondary | ICD-10-CM

## 2016-08-12 DIAGNOSIS — E785 Hyperlipidemia, unspecified: Secondary | ICD-10-CM | POA: Diagnosis not present

## 2016-08-12 DIAGNOSIS — Z0000 Encounter for general adult medical examination without abnormal findings: Secondary | ICD-10-CM

## 2016-08-12 DIAGNOSIS — Z951 Presence of aortocoronary bypass graft: Secondary | ICD-10-CM | POA: Diagnosis not present

## 2016-08-12 MED ORDER — PANTOPRAZOLE SODIUM 40 MG PO TBEC: 40.0000 mg | DELAYED_RELEASE_TABLET | ORAL | 3 refills | Status: DC

## 2016-08-12 MED ORDER — ATORVASTATIN CALCIUM 40 MG PO TABS: 40.0000 mg | ORAL_TABLET | Freq: Every day | ORAL | 3 refills | Status: DC

## 2016-08-12 NOTE — Progress Notes (Signed)
Subjective:     Patient ID: [name], male   DOB: 08/16/1936, 80 y.o.   MRN: [id number]  HPI Patient seen for Medicare subsequent annual wellness visit and medical follow-up.  His wife passed away about 2 years ago when he said some intermittent issues with depression since then. He started back sertraline a few months ago in his overall improved. Needs flu vaccine. Otherwise, vaccines up-to-date.  Abdominal aortic aneurysm. Is due for annual follow-up. He's not had any recent abdominal pain. He has GERD which is well controlled with pantoprazole. He has history of CAD. No recent chest pains. Remains on Lipitor and compliant with therapy. He has hypertension which has been stable on metoprolol. No headaches or dizziness.  Depression history as above. He had some weight loss but this seems to have stabilized for the most part. He is eating fairly regular meals. Sleeping okay. No suicidal ideation  Past Medical History:  Diagnosis Date  . At risk for sleep apnea    STOP-BANG= 6    SENT TO PCP 05-25-2014  . Benign positional vertigo   . Bladder cancer ([place])   . Coronary artery disease CARDIOLOGIST--  DR Acie Fredrickson  . ED (erectile dysfunction)   . GERD (gastroesophageal reflux disease)   . H/O hiatal hernia   . History of anal fissures   . History of colon polyps   . History of radiation therapy 08/24/12-10/22/12   prostate 6600cGy/33 sessions--  for biochemical recurrence  . Hyperlipidemia   . Hypertension   . OA (osteoarthritis)   . Recurrent prostate adenocarcinoma ([place]) first dx 09/2005--  radial prostatectomy-- gleason 4+3=7, pT2c, negative margin   04/2012--  BIOCHEMICAL  RECURRENCE to 0.33 /  SALVAGE IMRT SEPT to  NOV 2013  . S/P CABG x 2    09/  2014  . S/P dilatation of esophageal stricture    1992  . Sigmoid diverticulosis   . SUI (stress urinary incontinence), male   . Urethral stricture   . Wears glasses    Past Surgical History:  Procedure Laterality Date  . CARDIAC  CATHETERIZATION  08-27-2013  DR Martinique   CRITICAL OSTIAL LM STENOSIS/  diffuse disease LAD 40-50%  &   pLCx 30-40%/  . CARDIOVASCULAR STRESS TEST  08-18-2013  DR NASHER   SMALL/ MEDIUM AREA MILD SCAR INFEROSEPTAL WALL/ NO ISCHEMIA/  LOW RISK SCAN/  EF 62%/  LV WALL MOTION WITH SLIGHT DECREASED OF THE SEPTUM  . CORONARY ANGIOPLASTY  1991   PTCA  of LEFT CFX  . CORONARY ARTERY BYPASS GRAFT N/A 08/27/2013   Procedure: CORONARY ARTERY BYPASS GRAFTING (CABG) times two using left internal mammary and right saphenous vein using endoscope.;  Surgeon: Melrose Nakayama, MD;  Location: [facility];  Service: Open Heart Surgery;  Laterality: N/A;  . CYSTOSCOPY WITH URETHRAL DILATATION N/A 01/26/2014   Procedure: CYSTOSCOPY WITH URETHRAL DILATATION, BLADDER BIOPSY, FOLEY CATHETER;  Surgeon: Molli Hazard, MD;  Location: [facility];  Service: Urology;  Laterality: N/A;  . INGUINAL HERNIA REPAIR Bilateral 1994  . KNEE ARTHROSCOPY Right   . LEFT HEART CATHETERIZATION WITH CORONARY ANGIOGRAM N/A 08/27/2013   Procedure: LEFT HEART CATHETERIZATION WITH CORONARY ANGIOGRAM;  Surgeon: Peter M Martinique, MD;  Location[facility];  Service: Cardiovascular;  Laterality: N/A;  . LUMBAR DISC SURGERY  1980'S  . RADICAL RETROPUBIC PROSTATECTOMY W/ BILATERAL PELVIC NODE DISSECTION  11-18-2005  . URETHROTOMY N/A 05/30/2014   Procedure: CYSTOSCOPY  BALLOON DILATION AND DIRECT VISION INTERNAL URETHROTOMY;  Surgeon: Sharyn Creamer, MD;  Location: [facility];  Service: Urology;  Laterality: N/A;    reports that he quit smoking about 21 years ago. His smoking use included Cigarettes. He has a 20.00 pack-year smoking history. He has never used smokeless tobacco. He reports that he drinks alcohol. He reports that he does not use drugs. family history includes Arthritis in his other; Cancer in his brother and father; Hyperlipidemia in his other; Hypertension in his other. Allergies  Allergen Reactions  . Bee Venom  Other (See Comments)    Reaction unknown  . Amoxicillin Rash  . Hydrocodone Other (See Comments)    REACTION: disorientation   1.  Risk factors based on Past Medical , Social, and Family history reviewed and as indicated above with no changes 2.  Limitations in physical activities None.  No recent falls. He walks 30 minutes each morning on the treadmill 3.  Depression/mood recent depression improved on sertraline 4.  Hearing No defiits 5.  ADLs independent in all. 6.  Cognitive function (orientation to time and place, language, writing, speech,memory) no short or long term memory issues.  Language and judgement intact. 7.  Home Safety no issues 8.  Height, weight, and visual acuity.all stable. 9.  Counseling discussed fall prevention 10. Recommendation of preventive services. Flu vaccine given 11. Labs based on risk factors no labs indicated a day. He had recent chemistries and lipids through cardiology 12. Care Plan repeat abdominal aortic aneurysm surveillance 13. Other Providers Dr Acie Fredrickson, Cardiology. 14. Written schedule of screening/prevention services given to patient.   Review of Systems  Constitutional: Negative for activity change, appetite change, fatigue and fever.  HENT: Negative for congestion, ear pain and trouble swallowing.   Eyes: Negative for pain and visual disturbance.  Respiratory: Negative for cough, shortness of breath and wheezing.   Cardiovascular: Negative for chest pain and palpitations.  Gastrointestinal: Negative for abdominal distention, abdominal pain, blood in stool, constipation, diarrhea, nausea, rectal pain and vomiting.  Genitourinary: Negative for dysuria and hematuria.  Musculoskeletal: Negative for arthralgias and joint swelling.  Skin: Negative for rash.  Neurological: Negative for dizziness, syncope and headaches.  Hematological: Negative for adenopathy.  Psychiatric/Behavioral: Negative for confusion, sleep disturbance and suicidal ideas.  The patient is not nervous/anxious.        Objective:   Physical Exam  Constitutional: He is oriented to person, place, and time. He appears well-developed and well-nourished.  HENT:  Right Ear: External ear normal.  Left Ear: External ear normal.  Mouth/Throat: Oropharynx is clear and moist.  Neck: Neck supple. No thyromegaly present.  Cardiovascular: Normal rate and regular rhythm.  Exam reveals no gallop.   Pulmonary/Chest: Effort normal and breath sounds normal. No respiratory distress. He has no wheezes. He has no rales.  Abdominal: Soft. Bowel sounds are normal. There is no tenderness. There is no rebound and no guarding.  Pulsatile abdominal aorta-nontender  Musculoskeletal: He exhibits no edema.  Lymphadenopathy:    He has no cervical adenopathy.  Neurological: He is alert and oriented to person, place, and time. No cranial nerve deficit. Coordination normal.  Skin: No rash noted.  Psychiatric: He has a normal mood and affect. His behavior is normal. Judgment and thought content normal.       Assessment:     #1 Medicare subsequent annual wellness visit. Needs flu vaccine. Other immunizations up-to-date  #2 history of depression currently stable on sertraline  #3 history of CAD  #4 dyslipidemia  #5 hypertension  stable  # 6 GERD symptomatically stable  # 7 Abdominal aortic aneurysm      Plan:     -Schedule repeat abdominal aortic aneurysm duplex -Flu vaccine given -Continue with yearly lipids and chemistries -Refill pantoprazole and Lipitor for one year  [name] MD [facility]

## 2016-08-12 NOTE — Progress Notes (Signed)
Pre visit review using our clinic review tool, if applicable. No additional management support is needed unless otherwise documented below in the visit note. 

## 2016-08-12 NOTE — Patient Instructions (Signed)
Health Maintenance  Topic Date Due  . INFLUENZA VACCINE  07/02/2016  . TETANUS/TDAP  11/08/2020  . ZOSTAVAX  Addressed  . PNA vac Low Risk Adult  Completed   We will call you with aortic aneurysm screen.

## 2016-08-20 ENCOUNTER — Ambulatory Visit (HOSPITAL_COMMUNITY)
Admission: RE | Admit: 2016-08-20 | Discharge: 2016-08-20 | Disposition: A | Discharge status: Home / Self Care | Payer: Medicare Other | Source: Ambulatory Visit | Attending: Cardiovascular Disease | Admitting: Cardiovascular Disease

## 2016-08-20 DIAGNOSIS — I700 Atherosclerosis of aorta: Secondary | ICD-10-CM | POA: Diagnosis not present

## 2016-08-20 DIAGNOSIS — I745 Embolism and thrombosis of iliac artery: Secondary | ICD-10-CM | POA: Insufficient documentation

## 2016-08-20 DIAGNOSIS — I714 Abdominal aortic aneurysm, without rupture, unspecified: Secondary | ICD-10-CM

## 2016-09-10 ENCOUNTER — Telehealth: Payer: Self-pay | Admitting: Family Medicine

## 2016-09-10 NOTE — Telephone Encounter (Signed)
Left message for pt to call back  °

## 2016-09-10 NOTE — Telephone Encounter (Signed)
Pt would like results of AAA test

## 2016-09-11 ENCOUNTER — Other Ambulatory Visit: Payer: Self-pay

## 2016-09-11 ENCOUNTER — Telehealth: Payer: Self-pay | Admitting: Family Medicine

## 2016-09-11 NOTE — Telephone Encounter (Signed)
Pt is aware of normal results.

## 2016-09-11 NOTE — Telephone Encounter (Signed)
Autumn pt returned your call. °

## 2016-11-08 ENCOUNTER — Other Ambulatory Visit: Payer: Self-pay

## 2016-12-04 ENCOUNTER — Other Ambulatory Visit: Payer: Self-pay | Admitting: Cardiovascular Disease

## 2016-12-08 ENCOUNTER — Other Ambulatory Visit: Payer: Self-pay | Admitting: Family Medicine

## 2016-12-12 DIAGNOSIS — H40013 Open angle with borderline findings, low risk, bilateral: Secondary | ICD-10-CM | POA: Diagnosis not present

## 2017-01-07 ENCOUNTER — Encounter: Payer: Self-pay | Admitting: Neurology

## 2017-01-07 ENCOUNTER — Ambulatory Visit (INDEPENDENT_AMBULATORY_CARE_PROVIDER_SITE_OTHER): Payer: Medicare Other | Admitting: Neurology

## 2017-01-07 VITALS — BP 175/87 | HR 59 | Ht 67.0 in | Wt 147.0 lb

## 2017-01-07 DIAGNOSIS — H539 Unspecified visual disturbance: Secondary | ICD-10-CM | POA: Diagnosis not present

## 2017-01-07 NOTE — Progress Notes (Signed)
Reason for visit: Visual field disturbance  Referring physician: Dr. Drue Dun is a 81 y.o. male  History of present illness:  Mr. Dame is a 81 year old white male who is sent to this office for an evaluation of a visual field disturbance. The patient went for a routine ophthalmologic evaluation through Dr. Delman Cheadle, and a visual field test showed evidence of a right superior quadrantanopsia. The patient himself had noted no change in his vision at any time. He denies any headache, dizziness, slurred speech, word finding problems, or problems with swallowing. The patient does report some mild memory issues that may come and on over the last year or so. The patient denies any numbness or weakness of the extremities or face. He does have some issues with urinary incontinence associated with prostate surgery and radiation treatments. The patient has minimal balance issues, but no falls. He is sent to this office for further evaluation.  Past Medical History:  Diagnosis Date  . At risk for sleep apnea    STOP-BANG= 6    SENT TO PCP 05-25-2014  . Benign positional vertigo   . Bladder cancer ([place])   . Coronary artery disease CARDIOLOGIST--  DR Acie Fredrickson  . ED (erectile dysfunction)   . GERD (gastroesophageal reflux disease)   . H/O hiatal hernia   . History of anal fissures   . History of colon polyps   . History of radiation therapy 08/24/12-10/22/12   prostate 6600cGy/33 sessions--  for biochemical recurrence  . Hyperlipidemia   . Hypertension   . OA (osteoarthritis)   . Recurrent prostate adenocarcinoma ([place]) first dx 09/2005--  radial prostatectomy-- gleason 4+3=7, pT2c, negative margin   04/2012--  BIOCHEMICAL  RECURRENCE to 0.33 /  SALVAGE IMRT SEPT to  NOV 2013  . S/P CABG x 2    09/  2014  . S/P dilatation of esophageal stricture    1992  . Sigmoid diverticulosis   . SUI (stress urinary incontinence), male   . Urethral stricture   . Wears glasses     Past  Surgical History:  Procedure Laterality Date  . CARDIAC CATHETERIZATION  08-27-2013  DR Martinique   CRITICAL OSTIAL LM STENOSIS/  diffuse disease LAD 40-50%  &   pLCx 30-40%/  . CARDIOVASCULAR STRESS TEST  08-18-2013  DR NASHER   SMALL/ MEDIUM AREA MILD SCAR INFEROSEPTAL WALL/ NO ISCHEMIA/  LOW RISK SCAN/  EF 62%/  LV WALL MOTION WITH SLIGHT DECREASED OF THE SEPTUM  . CORONARY ANGIOPLASTY  1991   PTCA  of LEFT CFX  . CORONARY ARTERY BYPASS GRAFT N/A 08/27/2013   Procedure: CORONARY ARTERY BYPASS GRAFTING (CABG) times two using left internal mammary and right saphenous vein using endoscope.;  Surgeon: Melrose Nakayama, MD;  Location: [place];  Service: Open Heart Surgery;  Laterality: N/A;  . CYSTOSCOPY WITH URETHRAL DILATATION N/A 01/26/2014   Procedure: CYSTOSCOPY WITH URETHRAL DILATATION, BLADDER BIOPSY, FOLEY CATHETER;  Surgeon: Molli Hazard, MD;  Location: [facility];  Service: Urology;  Laterality: N/A;  . INGUINAL HERNIA REPAIR Bilateral 1994  . KNEE ARTHROSCOPY Right   . LEFT HEART CATHETERIZATION WITH CORONARY ANGIOGRAM N/A 08/27/2013   Procedure: LEFT HEART CATHETERIZATION WITH CORONARY ANGIOGRAM;  Surgeon: Peter M Martinique, MD;  Location: [facility] CATH LAB;  Service: Cardiovascular;  Laterality: N/A;  . LUMBAR DISC SURGERY  1980'S  . RADICAL RETROPUBIC PROSTATECTOMY W/ BILATERAL PELVIC NODE DISSECTION  11-18-2005  . URETHROTOMY N/A 05/30/2014   Procedure: CYSTOSCOPY  BALLOON DILATION AND DIRECT VISION INTERNAL URETHROTOMY;  Surgeon: Sharyn Creamer, MD;  Location: [facility];  Service: Urology;  Laterality: N/A;    Family History  Problem Relation Age of Onset  . Cancer Father     prostate died 36 something  . Cancer Brother     prostate cancer  . Arthritis Other   . Hyperlipidemia Other   . Hypertension Other     Social history:  reports that he quit smoking about 22 years ago. His smoking use included Cigarettes. He has a 20.00 pack-year smoking history. He has  never used smokeless tobacco. He reports that he drinks alcohol. He reports that he does not use drugs.  Medications:  Prior to Admission medications   Medication Sig Start Date End Date Taking? Authorizing Provider  aspirin 81 MG EC tablet Take 1 tablet (81 mg total) by mouth daily. 05/11/14  Yes Thayer Headings, MD  atorvastatin (LIPITOR) 40 MG tablet Take 1 tablet (40 mg total) by mouth at bedtime. 08/12/16  Yes [name], MD  metoprolol succinate (TOPROL-XL) 25 MG 24 hr tablet TAKE 1 TABLET BY MOUTH EVERY DAY 12/04/16  Yes Thayer Headings, MD  Multiple Vitamin (MULTIVITAMIN WITH MINERALS) TABS tablet Take 1 tablet by mouth daily.   Yes Historical Provider, MD  nitroGLYCERIN (NITROSTAT) 0.4 MG SL tablet PLACE 1 TABLET UNDER THE TONUE EVERY 5 MINUTES AS NEEDED FOR CHEST PAIN. 01/22/16  Yes Thayer Headings, MD  pantoprazole (PROTONIX) 40 MG tablet Take 1 tablet (40 mg total) by mouth every other day. Takes in am 08/12/16  Yes [name], MD  polyethylene glycol (MIRALAX / GLYCOLAX) packet Take 17 g by mouth daily as needed (for constipation).    Yes Historical Provider, MD  sertraline (ZOLOFT) 50 MG tablet TAKE 1 TABLET(50 MG) BY MOUTH DAILY 12/09/16  Yes [name], MD      Allergies  Allergen Reactions  . Bee Venom Other (See Comments)    Reaction unknown  . Amoxicillin Rash  . Hydrocodone Other (See Comments)    REACTION: disorientation    ROS:  Out of a complete 14 system review of symptoms, the patient complains only of the following symptoms, and all other reviewed systems are negative.  Urination problems Runny nose  Blood pressure (!) 175/87, pulse (!) 59, height 5\' 7"  (1.702 m), weight 147 lb (66.7 kg).  Physical Exam  General: The patient is alert and cooperative at the time of the examination.  Eyes: Pupils are equal, round, and reactive to light. Discs are flat bilaterally.  Neck: The neck is supple, no carotid bruits are noted.  Respiratory: The  respiratory examination is clear.  Cardiovascular: The cardiovascular examination reveals a regular rate and rhythm, no obvious murmurs or rubs are noted.  Skin: Extremities are without significant edema.  Neurologic Exam  Mental status: The patient is alert and oriented x 3 at the time of the examination. The patient has apparent normal recent and remote memory, with an apparently normal attention span and concentration ability.  Cranial nerves: Facial symmetry is present. There is good sensation of the face to pinprick and soft touch bilaterally. The strength of the facial muscles and the muscles to head turning and shoulder shrug are normal bilaterally. Speech is well enunciated, no aphasia or dysarthria is noted. Extraocular movements are full. Visual fields are full, with exception of a minimal defect in the right upper quadrant of both eyes.. The tongue is  midline, and the patient has symmetric elevation of the soft palate. No obvious hearing deficits are noted.  Motor: The motor testing reveals 5 over 5 strength of all 4 extremities. Good symmetric motor tone is noted throughout.  Sensory: Sensory testing is intact to pinprick, soft touch, vibration sensation, and position sense on all 4 extremities. No evidence of extinction is noted.  Coordination: Cerebellar testing reveals good finger-nose-finger and heel-to-shin bilaterally.  Gait and station: Gait is normal. Tandem gait is unsteady. Romberg is negative. No drift is seen.  Reflexes: Deep tendon reflexes are symmetric and normal bilaterally. Toes are downgoing bilaterally.   Assessment/Plan:  1. Visual field disturbance, right superior quadrantanopsia  The patient will be evaluated for possible cerebrovascular disease affecting the left temporal lobe. The patient will be set up for MRI of the brain. If a stroke is confirmed, the patient will be sent for carotid Doppler study and a 2-D echocardiogram. The patient is on  medications for cholesterol and is on a baby aspirin daily.  Jill Alexanders MD 01/07/2017 2:31 PM  [facility] [address] [place], [place] [address]  Phone [phone] Fax [phone]

## 2017-01-07 NOTE — Patient Instructions (Signed)
    We will check MRI of the brain. 

## 2017-01-09 ENCOUNTER — Encounter: Payer: Self-pay | Admitting: Cardiovascular Disease

## 2017-01-12 ENCOUNTER — Ambulatory Visit
Admission: RE | Admit: 2017-01-12 | Discharge: 2017-01-12 | Disposition: A | Discharge status: Home / Self Care | Payer: Medicare Other | Source: Ambulatory Visit | Attending: Neurology | Admitting: Neurology

## 2017-01-12 DIAGNOSIS — H539 Unspecified visual disturbance: Secondary | ICD-10-CM | POA: Diagnosis not present

## 2017-01-12 DIAGNOSIS — H538 Other visual disturbances: Secondary | ICD-10-CM | POA: Diagnosis not present

## 2017-01-13 ENCOUNTER — Telehealth: Payer: Self-pay | Admitting: Neurology

## 2017-01-13 ENCOUNTER — Encounter: Payer: Self-pay | Admitting: Cardiovascular Disease

## 2017-01-13 ENCOUNTER — Ambulatory Visit (INDEPENDENT_AMBULATORY_CARE_PROVIDER_SITE_OTHER): Payer: Medicare Other | Admitting: Cardiovascular Disease

## 2017-01-13 VITALS — BP 140/70 | HR 58 | Ht 67.0 in | Wt 149.8 lb

## 2017-01-13 DIAGNOSIS — I714 Abdominal aortic aneurysm, without rupture, unspecified: Secondary | ICD-10-CM

## 2017-01-13 DIAGNOSIS — I2510 Atherosclerotic heart disease of native coronary artery without angina pectoris: Secondary | ICD-10-CM | POA: Diagnosis not present

## 2017-01-13 DIAGNOSIS — E782 Mixed hyperlipidemia: Secondary | ICD-10-CM | POA: Diagnosis not present

## 2017-01-13 MED ORDER — NITROGLYCERIN 0.4 MG SL SUBL: SUBLINGUAL_TABLET | SUBLINGUAL | 3 refills | Status: DC

## 2017-01-13 NOTE — Patient Instructions (Signed)

## 2017-01-13 NOTE — Telephone Encounter (Signed)
I called the patient. MRI of the brain does show some small vessel disease, nothing obvious that would cause the visual complaint noted by the patient. The patient will stay on aspirin at this time, I will not pursue further vascular workup.   MRI brain 01/13/17:  IMPRESSION:  This MRI of the brain without contrast shows the following: 1.   T2/FLAIR hyperintense foci in the cortical and deep white matter of both hemispheres consistent with chronic microvascular ischemic change. The extent is mildly more than expected for age. 2.   There are no acute findings.

## 2017-01-13 NOTE — Progress Notes (Signed)
[name] Date of Birth  1936-11-12 [place] [address]    [place]   [place], [place]  [address]    [place], [place]  [address] [phone]  Fax  [phone]  [phone]  Fax [phone]  Problem list: 1. Coronary artery disease- status post remote PTCA to the left complex artery in 1991, CABG 2014 2. Hypertension 3. Hyperlipidemia 4. Prostate Cancer.  History of Present Illness:  [name] is  a 81 y.o. -year-old gentleman with a history of PTCA approximately 22 years ago.  He's not had any episodes of chest pain or shortness of breath. He stays fairly active although he doesn't exercise at the gym.  He has some problems with some arthritis-type pain in his right hip.  The Lipitor causes a little bit of muscular skeletal pain.  May 10, 2013:  He stopped hib Benicar 2 months ago. His BP has been running low at home.  He has been on a new medicine for his prostate and was concerned that his BP would run too low.   He has been taking a varied dose of benicar - 1 tab, 1/2 tab, 1/4 tablet whenever he thinks he needs it.   Sept. 5, 2014:  [name] presents today for worsening shortness of breath and chest pain.  Last Saturday, he mowed the lawn and spread 200 lbs of lime.  He developed severe CP and started to take some NTG.  The pain finally eased up. Several days later, he had recurrent CP while cleaning out the gutters.    He thinks he was just overheated.   He has been recording his BP and has been getting some low readings.     He has been having some hip pain and does not know if he will be able to walk on the treadmill.   Sept. 24, 2014: [name] was seen recently with some worsening chest pain. He had a stress Myoview study which was interpreted as low risk he has a fixed inferoapical defect.  He exertion, he is still having angina.  Lasts 5 minutes. In the center of his chest. No radiatioin.  Occurs only with  exertion ( stating mower or Rototiller- never walking around the house.)   Class II - III angina.    Yesterday, he had some mild baseline CP which worsened while walking around the grocery store.   Dec. 16, 2014:  [name] has had CABG since I last saw him in the office.  Still has chest tenderness - breathing is good, no CP  Chest wall is still tender.  May 11, 2014:  [name] is doing ok.   Jan. 4, 2015: Doing well  July 21, 2015:  Doing well from a cardiac standpoint  Still lonely ,  Mentally not where he needs to be since the death of his wife .   Visits with his 2 daughters frequently    Feb. 16, 2017:  Still grieving over the death of his wife , 16 months ago .   Were married 84 years.  Got some counseling at [facility] but was not as effective as he would like    Aug. 16, 2017: No CP or dyspnea  Has easy bruising on his arms due to the ASA walks some on the treadmill  No CP   Feb. 12, 2018  Has had some visual issues. Had a head MRI yesterday .   No CP or  dyspnea   Current Outpatient Prescriptions on File Prior to Visit  Medication Sig Dispense Refill  . aspirin 81 MG EC tablet Take 1 tablet (81 mg total) by mouth daily.    [name] atorvastatin (LIPITOR) 40 MG tablet Take 1 tablet (40 mg total) by mouth at bedtime. 90 tablet 3  . metoprolol succinate (TOPROL-XL) 25 MG 24 hr tablet TAKE 1 TABLET BY MOUTH EVERY DAY 90 tablet 0  . Multiple Vitamin (MULTIVITAMIN WITH MINERALS) TABS tablet Take 1 tablet by mouth daily.    . nitroGLYCERIN (NITROSTAT) 0.4 MG SL tablet PLACE 1 TABLET UNDER THE TONUE EVERY 5 MINUTES AS NEEDED FOR CHEST PAIN. 225 tablet 0  . pantoprazole (PROTONIX) 40 MG tablet Take 1 tablet (40 mg total) by mouth every other day. Takes in am 90 tablet 3  . polyethylene glycol (MIRALAX / GLYCOLAX) packet Take 17 g by mouth daily as needed (for constipation).     . sertraline (ZOLOFT) 50 MG tablet TAKE 1 TABLET(50 MG) BY MOUTH DAILY 30 tablet 3   No current  facility-administered medications on file prior to visit.     Allergies  Allergen Reactions  . Bee Venom Other (See Comments)    Reaction unknown  . Amoxicillin Rash  . Hydrocodone Other (See Comments)    REACTION: disorientation    Past Medical History:  Diagnosis Date  . At risk for sleep apnea    STOP-BANG= 6    SENT TO PCP 05-25-2014  . Benign positional vertigo   . Bladder cancer ([place])   . Coronary artery disease CARDIOLOGIST--  DR Acie Fredrickson  . ED (erectile dysfunction)   . GERD (gastroesophageal reflux disease)   . H/O hiatal hernia   . History of anal fissures   . History of colon polyps   . History of radiation therapy 08/24/12-10/22/12   prostate 6600cGy/33 sessions--  for biochemical recurrence  . Hyperlipidemia   . Hypertension   . OA (osteoarthritis)   . Recurrent prostate adenocarcinoma ([place]) first dx 09/2005--  radial prostatectomy-- gleason 4+3=7, pT2c, negative margin   04/2012--  BIOCHEMICAL  RECURRENCE to 0.33 /  SALVAGE IMRT SEPT to  NOV 2013  . S/P CABG x 2    09/  2014  . S/P dilatation of esophageal stricture    1992  . Sigmoid diverticulosis   . SUI (stress urinary incontinence), male   . Urethral stricture   . Wears glasses     Past Surgical History:  Procedure Laterality Date  . CARDIAC CATHETERIZATION  08-27-2013  DR Martinique   CRITICAL OSTIAL LM STENOSIS/  diffuse disease LAD 40-50%  &   pLCx 30-40%/  . CARDIOVASCULAR STRESS TEST  08-18-2013  DR NASHER   SMALL/ MEDIUM AREA MILD SCAR INFEROSEPTAL WALL/ NO ISCHEMIA/  LOW RISK SCAN/  EF 62%/  LV WALL MOTION WITH SLIGHT DECREASED OF THE SEPTUM  . CORONARY ANGIOPLASTY  1991   PTCA  of LEFT CFX  . CORONARY ARTERY BYPASS GRAFT N/A 08/27/2013   Procedure: CORONARY ARTERY BYPASS GRAFTING (CABG) times two using left internal mammary and right saphenous vein using endoscope.;  Surgeon: Melrose Nakayama, MD;  Location: [place];  Service: Open Heart Surgery;  Laterality: N/A;  . CYSTOSCOPY WITH URETHRAL  DILATATION N/A 01/26/2014   Procedure: CYSTOSCOPY WITH URETHRAL DILATATION, BLADDER BIOPSY, FOLEY CATHETER;  Surgeon: Molli Hazard, MD;  Location[facility];  Service: Urology;  Laterality: N/A;  . INGUINAL HERNIA REPAIR Bilateral 1994  . KNEE ARTHROSCOPY Right   .  LEFT HEART CATHETERIZATION WITH CORONARY ANGIOGRAM N/A 08/27/2013   Procedure: LEFT HEART CATHETERIZATION WITH CORONARY ANGIOGRAM;  Surgeon: Peter M Martinique, MD;  Location: [facility] CATH LAB;  Service: Cardiovascular;  Laterality: N/A;  . LUMBAR DISC SURGERY  1980'S  . RADICAL RETROPUBIC PROSTATECTOMY W/ BILATERAL PELVIC NODE DISSECTION  11-18-2005  . URETHROTOMY N/A 05/30/2014   Procedure: CYSTOSCOPY  BALLOON DILATION AND DIRECT VISION INTERNAL URETHROTOMY;  Surgeon: Sharyn Creamer, MD;  Location: [facility];  Service: Urology;  Laterality: N/A;    History  Smoking Status  . Former Smoker  . Packs/day: 2.00  . Years: 10.00  . Types: Cigarettes  . Quit date: 12/03/1994  Smokeless Tobacco  . Never Used    History  Alcohol Use  . Yes    Comment: occasionally    Family History  Problem Relation Age of Onset  . Cancer Father     prostate died 27 something  . Cancer Brother     prostate cancer  . Arthritis Other   . Hyperlipidemia Other   . Hypertension Other     Reviw of Systems:  Reviewed in the HPI.  All other systems are negative.  Physical Exam: Blood pressure 140/70, pulse (!) 58, height 5\' 7"  (1.702 m), weight 149 lb 12.8 oz (67.9 kg), SpO2 99 %. General: Well developed, well nourished, in no acute distress. Head: Normocephalic, atraumatic, sclera non-icteric, mucus membranes are moist,  Neck: Supple. Negative for carotid bruits. JVD not elevated. Lungs: Clear bilaterally to auscultation without wheezes, rales, or rhonchi. Breathing is unlabored. Heart: RRR with S1 S2. No murmurs, rubs, or gallops appreciated. Abdomen: Soft, non-tender, non-distended with normoactive bowel sounds.  +  pulsitile mass c/w small abdominal aneurism.  Msk:  Strength and tone appear normal for age. Extremities: No clubbing or cyanosis. No edema.  Distal pedal pulses are 2+ and equal bilaterally. Neuro: Alert and oriented X 3. Moves all extremities spontaneously. Psych:  Responds to questions appropriately with a normal affect.  ECG: Aug. 16, 2017:   Sinus brady at 56.   No ST or T wave changes.    Assessment / Plan:   1. Coronary artery disease- status post remote PTCA to the left complex artery in 1991, CABG 2014, he is doing well. No angina. Will see him in 1 year  2. Hypertension - BP is well controlled  3. Hyperlipidemia- lipids are well conrolled  4. Prostate Cancer. 5. Abdominal aortic aneurism  -  The AAA is stable by [place] September, 2016.   Due to recheck in several months   He seems depressed.   Mertie Moores, MD  01/13/2017 12:07 PM    [place],  [place], [place]  [address] Pager [phone] Phone: [phone]; Fax: [phone]

## 2017-01-14 ENCOUNTER — Other Ambulatory Visit: Payer: Medicare Other

## 2017-01-14 LAB — COMPREHENSIVE METABOLIC PANEL
ALT: 22 IU/L (ref 0–44)
AST: 23 IU/L (ref 0–40)
Albumin/Globulin Ratio: 1.9 (ref 1.2–2.2)
Albumin: 4.6 g/dL (ref 3.5–4.8)
Alkaline Phosphatase: 73 IU/L (ref 39–117)
BUN/Creatinine Ratio: 17 (ref 10–24)
BUN: 21 mg/dL (ref 8–27)
Bilirubin Total: 0.7 mg/dL (ref 0.0–1.2)
CO2: 27 mmol/L (ref 18–29)
Calcium: 9.5 mg/dL (ref 8.6–10.2)
Chloride: 100 mmol/L (ref 96–106)
Creatinine, Ser: 1.23 mg/dL (ref 0.76–1.27)
GFR calc Af Amer: 65 mL/min/{1.73_m2} (ref >59)
GFR calc non Af Amer: 56 mL/min/{1.73_m2} — ABNORMAL LOW (ref >59)
Globulin, Total: 2.4 g/dL (ref 1.5–4.5)
Glucose: 85 mg/dL (ref 65–99)
Potassium: 4.5 mmol/L (ref 3.5–5.2)
Sodium: 143 mmol/L (ref 134–144)
Total Protein: 7 g/dL (ref 6.0–8.5)

## 2017-01-14 LAB — LIPID PANEL
Chol/HDL Ratio: 2.9 ratio units (ref 0.0–5.0)
Cholesterol, Total: 134 mg/dL (ref 100–199)
HDL: 47 mg/dL (ref >39)
LDL Calculated: 65 mg/dL (ref 0–99)
Triglycerides: 109 mg/dL (ref 0–149)
VLDL Cholesterol Cal: 22 mg/dL (ref 5–40)

## 2017-01-15 DIAGNOSIS — Z8546 Personal history of malignant neoplasm of prostate: Secondary | ICD-10-CM | POA: Diagnosis not present

## 2017-01-15 DIAGNOSIS — Z8551 Personal history of malignant neoplasm of bladder: Secondary | ICD-10-CM | POA: Diagnosis not present

## 2017-01-16 DIAGNOSIS — H401131 Primary open-angle glaucoma, bilateral, mild stage: Secondary | ICD-10-CM | POA: Diagnosis not present

## 2017-02-14 DIAGNOSIS — Z8551 Personal history of malignant neoplasm of bladder: Secondary | ICD-10-CM | POA: Diagnosis not present

## 2017-02-14 DIAGNOSIS — Z8546 Personal history of malignant neoplasm of prostate: Secondary | ICD-10-CM | POA: Diagnosis not present

## 2017-02-14 DIAGNOSIS — N320 Bladder-neck obstruction: Secondary | ICD-10-CM | POA: Diagnosis not present

## 2017-02-24 DIAGNOSIS — H401131 Primary open-angle glaucoma, bilateral, mild stage: Secondary | ICD-10-CM | POA: Diagnosis not present

## 2017-03-09 ENCOUNTER — Other Ambulatory Visit: Payer: Self-pay | Admitting: Cardiovascular Disease

## 2017-04-10 ENCOUNTER — Other Ambulatory Visit: Payer: Self-pay | Admitting: Family Medicine

## 2017-05-12 ENCOUNTER — Ambulatory Visit (INDEPENDENT_AMBULATORY_CARE_PROVIDER_SITE_OTHER): Payer: Medicare Other | Admitting: Internal Medicine

## 2017-05-12 ENCOUNTER — Encounter: Payer: Self-pay | Admitting: Internal Medicine

## 2017-05-12 VITALS — BP 160/80 | HR 61 | Temp 98.0°F | Ht 67.0 in | Wt 148.4 lb

## 2017-05-12 DIAGNOSIS — I2510 Atherosclerotic heart disease of native coronary artery without angina pectoris: Secondary | ICD-10-CM | POA: Diagnosis not present

## 2017-05-12 DIAGNOSIS — R42 Dizziness and giddiness: Secondary | ICD-10-CM

## 2017-05-12 DIAGNOSIS — D649 Anemia, unspecified: Secondary | ICD-10-CM | POA: Diagnosis not present

## 2017-05-12 DIAGNOSIS — R2689 Other abnormalities of gait and mobility: Secondary | ICD-10-CM

## 2017-05-12 DIAGNOSIS — I10 Essential (primary) hypertension: Secondary | ICD-10-CM | POA: Diagnosis not present

## 2017-05-12 DIAGNOSIS — R718 Other abnormality of red blood cells: Secondary | ICD-10-CM | POA: Diagnosis not present

## 2017-05-12 NOTE — Progress Notes (Signed)
Chief Complaint  Patient presents with  . Dizziness    sx started this morning    HPI: [name] 81 y.o.  sda  Requested appt fro above sx.  He states he was in his usual state of health until mid day today after taking his walk sitting doing a puzzle he bent over and felt what he calls dizzy and off-balance. There was no associated chest pain shortness of breath vision changes from baseline hearing changes syncope or true vertigo that he could describe. There were no other events with this but he felt off and called got an appointment was able to try. Denies any history of similar symptoms although benign positional vertigo is on his past list. He is still trying to survive the death of his wife and half years ago which has been difficult. No change in medicine except for Glaucoma medicine  New eye drops.  Not similar sx.  No cp sob ocass pain .  He states blood pressure at home is in the 120 - 130 range. Has a monitor at home ROS: See pertinent positives and negatives per HPI. No bleeding falling  Local weakness    Sees urology .  Had cocktail a few days ago no t otherwise  Past Medical History:  Diagnosis Date  . At risk for sleep apnea    STOP-BANG= 6    SENT TO PCP 05-25-2014  . Benign positional vertigo   . Bladder cancer ([place])   . Coronary artery disease CARDIOLOGIST--  DR Acie Fredrickson  . ED (erectile dysfunction)   . GERD (gastroesophageal reflux disease)   . H/O hiatal hernia   . History of anal fissures   . History of colon polyps   . History of radiation therapy 08/24/12-10/22/12   prostate 6600cGy/33 sessions--  for biochemical recurrence  . Hyperlipidemia   . Hypertension   . OA (osteoarthritis)   . Recurrent prostate adenocarcinoma ([place]) first dx 09/2005--  radial prostatectomy-- gleason 4+3=7, pT2c, negative margin   04/2012--  BIOCHEMICAL  RECURRENCE to 0.33 /  SALVAGE IMRT SEPT to  NOV 2013  . S/P CABG x 2    09/  2014  . S/P dilatation of esophageal  stricture    1992  . Sigmoid diverticulosis   . SUI (stress urinary incontinence), male   . Urethral stricture   . Wears glasses     Family History  Problem Relation Age of Onset  . Cancer Father        prostate died 55 something  . Cancer Brother        prostate cancer  . Arthritis Other   . Hyperlipidemia Other   . Hypertension Other     Social History   Social History  . Marital status: Married    Spouse name: N/A  . Number of children: 2  . Years of education: 13   Occupational History  . Retired Retired   Social History Main Topics  . Smoking status: Former Smoker    Packs/day: 2.00    Years: 10.00    Types: Cigarettes    Quit date: 12/03/1994  . Smokeless tobacco: Never Used  . Alcohol use Yes     Comment: occasionally  . Drug use: No  . Sexual activity: Not Asked   Other Topics Concern  . None   Social History Narrative   Lives alone   Caffeine use: Coffee- 3 cups daily   Soda sometimes.     Outpatient Medications Prior to  Visit  Medication Sig Dispense Refill  . aspirin 81 MG EC tablet Take 1 tablet (81 mg total) by mouth daily.    [name] atorvastatin (LIPITOR) 40 MG tablet Take 1 tablet (40 mg total) by mouth at bedtime. 90 tablet 3  . metoprolol succinate (TOPROL-XL) 25 MG 24 hr tablet TAKE 1 TABLET BY MOUTH EVERY DAY 90 tablet 0  . Multiple Vitamin (MULTIVITAMIN WITH MINERALS) TABS tablet Take 1 tablet by mouth daily.    . nitroGLYCERIN (NITROSTAT) 0.4 MG SL tablet PLACE 1 TABLET UNDER THE TONUE EVERY 5 MINUTES AS NEEDED FOR CHEST PAIN. 75 tablet 3  . pantoprazole (PROTONIX) 40 MG tablet Take 1 tablet (40 mg total) by mouth every other day. Takes in am 90 tablet 3  . polyethylene glycol (MIRALAX / GLYCOLAX) packet Take 17 g by mouth daily as needed (for constipation).     . sertraline (ZOLOFT) 50 MG tablet TAKE 1 TABLET(50 MG) BY MOUTH DAILY 90 tablet 1   No facility-administered medications prior to visit.      EXAM:  BP (!) 160/80 (BP  Location: Right Arm, Patient Position: Sitting, Cuff Size: Normal)   Pulse 61   Temp 98 F (36.7 C) (Oral)   Ht 5\' 7"  (1.702 m)   Wt 148 lb 6.4 oz (67.3 kg)   BMI 23.24 kg/m   Body mass index is 23.24 kg/m. Repeat bp 178/70 sitting and standing 168/80 reg pulse  GENERAL: vitals reviewed and listed above, alert, oriented, appears well hydrated and in no acute distress somewhat subdued but cong ition seems normal  HEENT: atraumatic, conjunctiva  clear, no obvious abnormalities on inspection of external nose and ears tmx clear  OP : no lesion edema or exudate  Tongue is midline  NECK: no obvious masses on inspection palpation  LUNGS: clear to auscultation bilaterally, no wheezes, rales or rhonchi, good air movement CV: HRRR, no clubbing cyanosis or  peripheral edema nl cap refill  Abdomen:  Sof,t normal bowel sounds without hepatosplenomegaly, no guarding rebound or masses no CVA tenderness MS: moves all extremities without noticeable focal  abnormality PSYCH: pleasant and cooperative, NEURO: oriented x 3 CN 3-12 appear intact. No focal muscle weakness or atrophy. DTRs symmetrical. Gait WNL.  Grossly non focal. No tremor or abnormal movement. Neg rhomberg neg drift . However  When asking him to bend over to touch floor he got off balance   To rise    see mri head in feb per dr Jannifer Franklin no acut findings  Done for visual ? Sx    ASSESSMENT AND PLAN:  Discussed the following assessment and plan:  Positional dizziness - Plan: Basic metabolic panel, CBC with Differential/Platelet  Essential hypertension - discrespancy in home and our readings  needs fu  - Plan: Basic metabolic panel, CBC with Differential/Platelet Known vascular disease but   Positional sx   Exam is reassuring  close fu advised   -Patient advised to return or notify health care team  if symptoms worsen ,persist or new concerns arise.  Patient Instructions   Uncertain cause but reassuring   This could be  Postitional    vertigo  .  And trx may be PT  Caution until you are better  Getting check for   Kidney   And  Anemia .  Your blood pressusre is up in the office  Need  Follow up of this .    Fu with you pcp .   Or cardiologist about your bp  Take  blood pressure readings twice a day f and record   Make appt to see you pcp with your monitor   This week   Thursday or Friday   BP Readings from Last 3 Encounters:  05/12/17 (!) 160/80  01/13/17 140/70  01/07/17 (!) 175/87        Dizziness Dizziness is a common problem. It is a feeling of unsteadiness or light-headedness. You may feel like you are about to faint. Dizziness can lead to injury if you stumble or fall. Anyone can become dizzy, but dizziness is more common in older adults. This condition can be caused by a number of things, including medicines, dehydration, or illness. Follow these instructions at home: Taking these steps may help with your condition: Eating and drinking  Drink enough fluid to keep your urine clear or pale yellow. This helps to keep you from becoming dehydrated. Try to drink more clear fluids, such as water.  Do not drink alcohol.  Limit your caffeine intake if directed by your health care provider.  Limit your salt intake if directed by your health care provider. Activity  Avoid making quick movements. ? Rise slowly from chairs and steady yourself until you feel okay. ? In the morning, first sit up on the side of the bed. When you feel okay, stand slowly while you hold onto something until you know that your balance is fine.  Move your legs often if you need to stand in one place for a long time. Tighten and relax your muscles in your legs while you are standing.  Do not drive or operate heavy machinery if you feel dizzy.  Avoid bending down if you feel dizzy. Place items in your home so that they are easy for you to reach without leaning over. Lifestyle  Do not use any tobacco products, including cigarettes, chewing  tobacco, or electronic cigarettes. If you need help quitting, ask your health care provider.  Try to reduce your stress level, such as with yoga or meditation. Talk with your health care provider if you need help. General instructions  Watch your dizziness for any changes.  Take medicines only as directed by your health care provider. Talk with your health care provider if you think that your dizziness is caused by a medicine that you are taking.  Tell a friend or a family member that you are feeling dizzy. If he or she notices any changes in your behavior, have this person call your health care provider.  Keep all follow-up visits as directed by your health care provider. This is important. Contact a health care provider if:  Your dizziness does not go away.  Your dizziness or light-headedness gets worse.  You feel nauseous.  You have reduced hearing.  You have new symptoms.  You are unsteady on your feet or you feel like the room is spinning. Get help right away if:  You vomit or have diarrhea and are unable to eat or drink anything.  You have problems talking, walking, swallowing, or using your arms, hands, or legs.  You feel generally weak.  You are not thinking clearly or you have trouble forming sentences. It may take a friend or family member to notice this.  You have chest pain, abdominal pain, shortness of breath, or sweating.  Your vision changes.  You notice any bleeding.  You have a headache.  You have neck pain or a stiff neck.  You have a fever. This information is not intended to replace advice given  to you by your health care provider. Make sure you discuss any questions you have with your health care provider. Document Released: 05/14/2001 Document Revised: 04/25/2016 Document Reviewed: 11/14/2014 Elsevier Interactive Patient Education  2017 [place] K. Panosh M.D.

## 2017-05-12 NOTE — Patient Instructions (Addendum)
Uncertain cause but reassuring   This could be  Postitional   vertigo  .  And trx may be PT  Caution until you are better  Getting check for   Kidney   And  Anemia .  Your blood pressusre is up in the office  Need  Follow up of this .    Fu with you pcp .   Or cardiologist about your bp  Take blood pressure readings twice a day f and record   Make appt to see you pcp with your monitor   This week   Thursday or Friday   BP Readings from Last 3 Encounters:  05/12/17 (!) 160/80  01/13/17 140/70  01/07/17 (!) 175/87        Dizziness Dizziness is a common problem. It is a feeling of unsteadiness or light-headedness. You may feel like you are about to faint. Dizziness can lead to injury if you stumble or fall. Anyone can become dizzy, but dizziness is more common in older adults. This condition can be caused by a number of things, including medicines, dehydration, or illness. Follow these instructions at home: Taking these steps may help with your condition: Eating and drinking  Drink enough fluid to keep your urine clear or pale yellow. This helps to keep you from becoming dehydrated. Try to drink more clear fluids, such as water.  Do not drink alcohol.  Limit your caffeine intake if directed by your health care provider.  Limit your salt intake if directed by your health care provider. Activity  Avoid making quick movements. ? Rise slowly from chairs and steady yourself until you feel okay. ? In the morning, first sit up on the side of the bed. When you feel okay, stand slowly while you hold onto something until you know that your balance is fine.  Move your legs often if you need to stand in one place for a long time. Tighten and relax your muscles in your legs while you are standing.  Do not drive or operate heavy machinery if you feel dizzy.  Avoid bending down if you feel dizzy. Place items in your home so that they are easy for you to reach without leaning  over. Lifestyle  Do not use any tobacco products, including cigarettes, chewing tobacco, or electronic cigarettes. If you need help quitting, ask your health care provider.  Try to reduce your stress level, such as with yoga or meditation. Talk with your health care provider if you need help. General instructions  Watch your dizziness for any changes.  Take medicines only as directed by your health care provider. Talk with your health care provider if you think that your dizziness is caused by a medicine that you are taking.  Tell a friend or a family member that you are feeling dizzy. If he or she notices any changes in your behavior, have this person call your health care provider.  Keep all follow-up visits as directed by your health care provider. This is important. Contact a health care provider if:  Your dizziness does not go away.  Your dizziness or light-headedness gets worse.  You feel nauseous.  You have reduced hearing.  You have new symptoms.  You are unsteady on your feet or you feel like the room is spinning. Get help right away if:  You vomit or have diarrhea and are unable to eat or drink anything.  You have problems talking, walking, swallowing, or using your arms, hands, or legs.  You  feel generally weak.  You are not thinking clearly or you have trouble forming sentences. It may take a friend or family member to notice this.  You have chest pain, abdominal pain, shortness of breath, or sweating.  Your vision changes.  You notice any bleeding.  You have a headache.  You have neck pain or a stiff neck.  You have a fever. This information is not intended to replace advice given to you by your health care provider. Make sure you discuss any questions you have with your health care provider. Document Released: 05/14/2001 Document Revised: 04/25/2016 Document Reviewed: 11/14/2014 Elsevier Interactive Patient Education  2017 [employer].

## 2017-05-13 ENCOUNTER — Other Ambulatory Visit: Payer: Self-pay | Admitting: Emergency Medicine

## 2017-05-13 ENCOUNTER — Other Ambulatory Visit (INDEPENDENT_AMBULATORY_CARE_PROVIDER_SITE_OTHER): Payer: Medicare Other

## 2017-05-13 DIAGNOSIS — R2689 Other abnormalities of gait and mobility: Secondary | ICD-10-CM | POA: Diagnosis not present

## 2017-05-13 DIAGNOSIS — R42 Dizziness and giddiness: Secondary | ICD-10-CM

## 2017-05-13 DIAGNOSIS — R718 Other abnormality of red blood cells: Secondary | ICD-10-CM

## 2017-05-13 DIAGNOSIS — D649 Anemia, unspecified: Secondary | ICD-10-CM | POA: Diagnosis not present

## 2017-05-13 LAB — CBC WITH DIFFERENTIAL/PLATELET
Basophils Absolute: 0 10*3/uL (ref 0.0–0.1)
Basophils Relative: 0.4 % (ref 0.0–3.0)
Eosinophils Absolute: 0.3 10*3/uL (ref 0.0–0.7)
Eosinophils Relative: 4.6 % (ref 0.0–5.0)
HCT: 40.7 % (ref 39.0–52.0)
Hemoglobin: 13.6 g/dL (ref 13.0–17.0)
Lymphocytes Relative: 13.3 % (ref 12.0–46.0)
Lymphs Abs: 0.8 10*3/uL (ref 0.7–4.0)
MCHC: 33.5 g/dL (ref 30.0–36.0)
MCV: 104 fl — ABNORMAL HIGH (ref 78.0–100.0)
Monocytes Absolute: 0.8 10*3/uL (ref 0.1–1.0)
Monocytes Relative: 12.7 % — ABNORMAL HIGH (ref 3.0–12.0)
Neutro Abs: 4.3 10*3/uL (ref 1.4–7.7)
Neutrophils Relative %: 69 % (ref 43.0–77.0)
Platelets: 160 10*3/uL (ref 150.0–400.0)
RBC: 3.91 Mil/uL — ABNORMAL LOW (ref 4.22–5.81)
RDW: 13.1 % (ref 11.5–15.5)
WBC: 6.2 10*3/uL (ref 4.0–10.5)

## 2017-05-13 LAB — BASIC METABOLIC PANEL
BUN: 25 mg/dL — ABNORMAL HIGH (ref 6–23)
CO2: 31 mEq/L (ref 19–32)
Calcium: 9.8 mg/dL (ref 8.4–10.5)
Chloride: 103 mEq/L (ref 96–112)
Creatinine, Ser: 1.42 mg/dL (ref 0.40–1.50)
GFR: 51.08 mL/min — ABNORMAL LOW (ref >60.00)
Glucose, Bld: 102 mg/dL — ABNORMAL HIGH (ref 70–99)
Potassium: 5.1 mEq/L (ref 3.5–5.1)
Sodium: 140 mEq/L (ref 135–145)

## 2017-05-13 NOTE — Addendum Note (Signed)
Addended by: Tomi Likens on: 05/13/2017 03:48 PM   Modules accepted: Orders

## 2017-05-14 LAB — VITAMIN B12: Vitamin B-12: 336 pg/mL (ref 211–911)

## 2017-05-16 ENCOUNTER — Ambulatory Visit (INDEPENDENT_AMBULATORY_CARE_PROVIDER_SITE_OTHER): Payer: Medicare Other | Admitting: Family Medicine

## 2017-05-16 ENCOUNTER — Encounter: Payer: Self-pay | Admitting: Family Medicine

## 2017-05-16 VITALS — BP 108/64 | HR 92 | Temp 98.1°F | Ht 67.0 in | Wt 149.0 lb

## 2017-05-16 DIAGNOSIS — I2510 Atherosclerotic heart disease of native coronary artery without angina pectoris: Secondary | ICD-10-CM | POA: Diagnosis not present

## 2017-05-16 DIAGNOSIS — Z7189 Other specified counseling: Secondary | ICD-10-CM

## 2017-05-16 DIAGNOSIS — I10 Essential (primary) hypertension: Secondary | ICD-10-CM

## 2017-05-16 DIAGNOSIS — R42 Dizziness and giddiness: Secondary | ICD-10-CM

## 2017-05-16 DIAGNOSIS — Z7185 Encounter for immunization safety counseling: Secondary | ICD-10-CM

## 2017-05-16 NOTE — Progress Notes (Signed)
Subjective:     Patient ID: [name], male   DOB: [birth year], 81 y.o.   MRN: [id number]  HPI Patient seen for 2 day follow-up following visit couple days ago for some nonspecific dizziness. When seen at that point his blood pressure was 160/80. He has been monitoring blood pressure since then it has been getting consistently good readings at home. He takes low-dose metoprolol. He had lab work done including chemistries, CBC, B12 level. His B12 levels normal at 336 but this has been declining over past couple years. He has started over-the-counter B12 1000 g daily.  His chemistries and CBC were unremarkable with the exception of elevated MCV 104. Patient states his dizziness is completely resolved and had resolved by the next day. He thinks perhaps he was not well-hydrated. Does not drink a lot of water. No consistent positional symptoms today. No recent vertigo. No chest pains. No dyspnea.  Patient has questions regarding new shingles vaccine.  Past Medical History:  Diagnosis Date  . At risk for sleep apnea    STOP-BANG= 6    SENT TO PCP 05-25-2014  . Benign positional vertigo   . Bladder cancer ([place])   . Coronary artery disease CARDIOLOGIST--  DR Acie Fredrickson  . ED (erectile dysfunction)   . GERD (gastroesophageal reflux disease)   . H/O hiatal hernia   . History of anal fissures   . History of colon polyps   . History of radiation therapy 08/24/12-10/22/12   prostate 6600cGy/33 sessions--  for biochemical recurrence  . Hyperlipidemia   . Hypertension   . OA (osteoarthritis)   . Recurrent prostate adenocarcinoma ([place]) first dx 09/2005--  radial prostatectomy-- gleason 4+3=7, pT2c, negative margin   04/2012--  BIOCHEMICAL  RECURRENCE to 0.33 /  SALVAGE IMRT SEPT to  NOV 2013  . S/P CABG x 2    09/  2014  . S/P dilatation of esophageal stricture    1992  . Sigmoid diverticulosis   . SUI (stress urinary incontinence), male   . Urethral stricture   . Wears glasses    Past Surgical  History:  Procedure Laterality Date  . CARDIAC CATHETERIZATION  08-27-2013  DR Martinique   CRITICAL OSTIAL LM STENOSIS/  diffuse disease LAD 40-50%  &   pLCx 30-40%/  . CARDIOVASCULAR STRESS TEST  08-18-2013  DR NASHER   SMALL/ MEDIUM AREA MILD SCAR INFEROSEPTAL WALL/ NO ISCHEMIA/  LOW RISK SCAN/  EF 62%/  LV WALL MOTION WITH SLIGHT DECREASED OF THE SEPTUM  . CORONARY ANGIOPLASTY  1991   PTCA  of LEFT CFX  . CORONARY ARTERY BYPASS GRAFT N/A 08/27/2013   Procedure: CORONARY ARTERY BYPASS GRAFTING (CABG) times two using left internal mammary and right saphenous vein using endoscope.;  Surgeon: Melrose Nakayama, MD;  Location: [place];  Service: Open Heart Surgery;  Laterality: N/A;  . CYSTOSCOPY WITH URETHRAL DILATATION N/A 01/26/2014   Procedure: CYSTOSCOPY WITH URETHRAL DILATATION, BLADDER BIOPSY, FOLEY CATHETER;  Surgeon: Molli Hazard, MD;  Location: [facility];  Service: Urology;  Laterality: N/A;  . INGUINAL HERNIA REPAIR Bilateral 1994  . KNEE ARTHROSCOPY Right   . LEFT HEART CATHETERIZATION WITH CORONARY ANGIOGRAM N/A 08/27/2013   Procedure: LEFT HEART CATHETERIZATION WITH CORONARY ANGIOGRAM;  Surgeon: Peter M Martinique, MD;  Location: [facility] CATH LAB;  Service: Cardiovascular;  Laterality: N/A;  . LUMBAR DISC SURGERY  1980'S  . RADICAL RETROPUBIC PROSTATECTOMY W/ BILATERAL PELVIC NODE DISSECTION  11-18-2005  . URETHROTOMY N/A 05/30/2014   Procedure: CYSTOSCOPY  BALLOON DILATION AND DIRECT VISION INTERNAL URETHROTOMY;  Surgeon: Sharyn Creamer, MD;  Location: [facility];  Service: Urology;  Laterality: N/A;    reports that he quit smoking about 22 years ago. His smoking use included Cigarettes. He has a 20.00 pack-year smoking history. He has never used smokeless tobacco. He reports that he drinks alcohol. He reports that he does not use drugs. family history includes Arthritis in his other; Cancer in his brother and father; Hyperlipidemia in his other; Hypertension in his  other. Allergies  Allergen Reactions  . Bee Venom Other (See Comments)    Reaction unknown  . Amoxicillin Rash  . Hydrocodone Other (See Comments)    REACTION: disorientation     Review of Systems  Constitutional: Negative for chills, fatigue, fever and unexpected weight change.  Eyes: Negative for visual disturbance.  Respiratory: Negative for cough, chest tightness and shortness of breath.   Cardiovascular: Negative for chest pain, palpitations and leg swelling.  Gastrointestinal: Negative for abdominal pain.  Neurological: Negative for dizziness, syncope, weakness, light-headedness and headaches.  Hematological: Negative for adenopathy.       Objective:   Physical Exam  Constitutional: He is oriented to person, place, and time. He appears well-developed and well-nourished.  HENT:  Right Ear: External ear normal.  Left Ear: External ear normal.  Mouth/Throat: Oropharynx is clear and moist.  Neck: Neck supple. No thyromegaly present.  Cardiovascular: Normal rate and regular rhythm.   Pulmonary/Chest: Effort normal and breath sounds normal. No respiratory distress. He has no wheezes. He has no rales.  Musculoskeletal: He exhibits no edema.  Lymphadenopathy:    He has no cervical adenopathy.  Neurological: He is alert and oriented to person, place, and time. No cranial nerve deficit.  Skin: No rash noted.       Assessment:     #1 recent transient lightheadedness/dizziness resolved after 1 day  #2 history of hypertension currently stable with repeat left arm seated at rest 118/68. He brought his cuff and we obtained significant higher reading in the same arm of 154/70  #3 elevated MCV in absence of anemia. No evidence for B12 deficiency  #4 patient inquiring about new shingles vaccine    Plan:     -Consider check TSH with next blood draw -Continue to monitor blood pressure closely -Increase hydration -We discussed new shingles vaccine and prescription given if he  decides to get this at local pharmacy  [name] MD [facility]

## 2017-05-26 DIAGNOSIS — H401131 Primary open-angle glaucoma, bilateral, mild stage: Secondary | ICD-10-CM | POA: Diagnosis not present

## 2017-06-13 ENCOUNTER — Telehealth: Payer: Self-pay

## 2017-06-13 NOTE — Telephone Encounter (Signed)
Received PA request for Pantoprazole. PA submitted & pending. Key: [id number]

## 2017-06-13 NOTE — Telephone Encounter (Signed)
PA approved, form faxed back to pharmacy. 

## 2017-06-14 ENCOUNTER — Other Ambulatory Visit: Payer: Self-pay | Admitting: Cardiovascular Disease

## 2017-06-16 ENCOUNTER — Other Ambulatory Visit: Payer: Self-pay | Admitting: *Deleted

## 2017-06-16 MED ORDER — METOPROLOL SUCCINATE ER 25 MG PO TB24: 25.0000 mg | ORAL_TABLET | Freq: Every day | ORAL | 1 refills | Status: DC

## 2017-07-18 ENCOUNTER — Ambulatory Visit (INDEPENDENT_AMBULATORY_CARE_PROVIDER_SITE_OTHER): Payer: Medicare Other | Admitting: Family Medicine

## 2017-07-18 ENCOUNTER — Encounter: Payer: Self-pay | Admitting: Family Medicine

## 2017-07-18 VITALS — BP 110/70 | HR 64 | Temp 98.0°F | Wt 146.1 lb

## 2017-07-18 DIAGNOSIS — R5383 Other fatigue: Secondary | ICD-10-CM | POA: Diagnosis not present

## 2017-07-18 DIAGNOSIS — J301 Allergic rhinitis due to pollen: Secondary | ICD-10-CM

## 2017-07-18 DIAGNOSIS — I2510 Atherosclerotic heart disease of native coronary artery without angina pectoris: Secondary | ICD-10-CM

## 2017-07-18 NOTE — Patient Instructions (Signed)
Try either Nasacort AQ or Flonase nasal spray once daily Consider OTC Allegra, Claritan, or Xyzal Follow up for any fever or new symptoms.

## 2017-07-18 NOTE — Progress Notes (Signed)
Subjective:     Patient ID: [name], male   DOB: [birth year], 81 y.o.   MRN: [id number]  HPI Patient is somewhat of a poor historian. He is seen today stating that he "does not feel well ". He states he's had some intermittent headaches and sinus congestion postnasal drainage past week or so. Is not aware of any history of ragweed allergy. No purulent secretions. He has occasional cough which is productive of clear sputum. No fever. No chills. No dysuria. No abdominal pain. Generally weak but no focal weakness.  Patient states he "took too much acetaminophen". On further clarification he took a couple days of 2 tablets every 4 hours. He is not sure how many milligrams. He states the package stated he could take up to 2 every 4 hours. Patient has history of macrocytosis. Previous TSH and recent B12 level normal  Past Medical History:  Diagnosis Date  . At risk for sleep apnea    STOP-BANG= 6    SENT TO PCP 05-25-2014  . Benign positional vertigo   . Bladder cancer ([place])   . Coronary artery disease CARDIOLOGIST--  DR Acie Fredrickson  . ED (erectile dysfunction)   . GERD (gastroesophageal reflux disease)   . H/O hiatal hernia   . History of anal fissures   . History of colon polyps   . History of radiation therapy 08/24/12-10/22/12   prostate 6600cGy/33 sessions--  for biochemical recurrence  . Hyperlipidemia   . Hypertension   . OA (osteoarthritis)   . Recurrent prostate adenocarcinoma ([place]) first dx 09/2005--  radial prostatectomy-- gleason 4+3=7, pT2c, negative margin   04/2012--  BIOCHEMICAL  RECURRENCE to 0.33 /  SALVAGE IMRT SEPT to  NOV 2013  . S/P CABG x 2    09/  2014  . S/P dilatation of esophageal stricture    1992  . Sigmoid diverticulosis   . SUI (stress urinary incontinence), male   . Urethral stricture   . Wears glasses    Past Surgical History:  Procedure Laterality Date  . CARDIAC CATHETERIZATION  08-27-2013  DR Martinique   CRITICAL OSTIAL LM STENOSIS/  diffuse disease  LAD 40-50%  &   pLCx 30-40%/  . CARDIOVASCULAR STRESS TEST  08-18-2013  DR NASHER   SMALL/ MEDIUM AREA MILD SCAR INFEROSEPTAL WALL/ NO ISCHEMIA/  LOW RISK SCAN/  EF 62%/  LV WALL MOTION WITH SLIGHT DECREASED OF THE SEPTUM  . CORONARY ANGIOPLASTY  1991   PTCA  of LEFT CFX  . CORONARY ARTERY BYPASS GRAFT N/A 08/27/2013   Procedure: CORONARY ARTERY BYPASS GRAFTING (CABG) times two using left internal mammary and right saphenous vein using endoscope.;  Surgeon: Melrose Nakayama, MD;  Location: [place];  Service: Open Heart Surgery;  Laterality: N/A;  . CYSTOSCOPY WITH URETHRAL DILATATION N/A 01/26/2014   Procedure: CYSTOSCOPY WITH URETHRAL DILATATION, BLADDER BIOPSY, FOLEY CATHETER;  Surgeon: Molli Hazard, MD;  Location[facility];  Service: Urology;  Laterality: N/A;  . INGUINAL HERNIA REPAIR Bilateral 1994  . KNEE ARTHROSCOPY Right   . LEFT HEART CATHETERIZATION WITH CORONARY ANGIOGRAM N/A 08/27/2013   Procedure: LEFT HEART CATHETERIZATION WITH CORONARY ANGIOGRAM;  Surgeon: Peter M Martinique, MD;  Location: [facility] CATH LAB;  Service: Cardiovascular;  Laterality: N/A;  . LUMBAR DISC SURGERY  1980'S  . RADICAL RETROPUBIC PROSTATECTOMY W/ BILATERAL PELVIC NODE DISSECTION  11-18-2005  . URETHROTOMY N/A 05/30/2014   Procedure: CYSTOSCOPY  BALLOON DILATION AND DIRECT VISION INTERNAL URETHROTOMY;  Surgeon: Sharyn Creamer, MD;  Location: [place];  Service: Urology;  Laterality: N/A;    reports that he quit smoking about 22 years ago. His smoking use included Cigarettes. He has a 20.00 pack-year smoking history. He has never used smokeless tobacco. He reports that he drinks alcohol. He reports that he does not use drugs. family history includes Arthritis in his other; Cancer in his brother and father; Hyperlipidemia in his other; Hypertension in his other. Allergies  Allergen Reactions  . Bee Venom Other (See Comments)    Reaction unknown  . Amoxicillin Rash  . Hydrocodone Other (See  Comments)    REACTION: disorientation     Review of Systems  Constitutional: Positive for fatigue. Negative for chills and fever.  HENT: Positive for congestion and postnasal drip. Negative for sinus pain and sore throat.   Respiratory: Positive for cough. Negative for shortness of breath and wheezing.   Neurological: Positive for headaches.       Objective:   Physical Exam  Constitutional: He appears well-developed and well-nourished.  HENT:  Right Ear: External ear normal.  Left Ear: External ear normal.  Nose: Nose normal.  Mouth/Throat: Oropharynx is clear and moist. No oropharyngeal exudate.  Neck: Neck supple.  Cardiovascular: Normal rate and regular rhythm.   Pulmonary/Chest: Effort normal and breath sounds normal. No respiratory distress. He has no wheezes. He has no rales.  Musculoskeletal: He exhibits no edema.  Lymphadenopathy:    He has no cervical adenopathy.  Neurological: He is alert.  Psychiatric:  PHQ-2=1       Assessment:     Patient presents with nonspecific malaise. He describes was sounds like some allergic rhinitis type symptoms    Plan:     -Try over-the-counter Flonase or Nasacort -Consider over-the-counter Allegra, Claritin, or Xyzal. -Follow-up promptly for any fever or other new symptoms. Touch base if not improved in one week  [name] MD [facility] at [facility]

## 2017-08-11 ENCOUNTER — Other Ambulatory Visit: Payer: Self-pay | Admitting: Family Medicine

## 2017-08-14 ENCOUNTER — Telehealth: Payer: Self-pay | Admitting: Family Medicine

## 2017-08-14 DIAGNOSIS — I714 Abdominal aortic aneurysm, without rupture, unspecified: Secondary | ICD-10-CM

## 2017-08-14 NOTE — Telephone Encounter (Signed)
Pt had last aorta ultrasound at [facility] in sept 2017 and would like to know if he need another one.

## 2017-08-15 NOTE — Telephone Encounter (Signed)
Yes.   This should be followed yearly.

## 2017-08-15 NOTE — Telephone Encounter (Signed)
I called the pt and informed him of the message below.Patient requested to have this done at [place], is aware the order was placed and someone will call with appt info.

## 2017-08-20 ENCOUNTER — Ambulatory Visit (INDEPENDENT_AMBULATORY_CARE_PROVIDER_SITE_OTHER): Payer: Medicare Other | Admitting: *Deleted

## 2017-08-20 DIAGNOSIS — Z23 Encounter for immunization: Secondary | ICD-10-CM

## 2017-08-21 ENCOUNTER — Encounter: Payer: Self-pay | Admitting: Family Medicine

## 2017-08-26 ENCOUNTER — Ambulatory Visit
Admission: RE | Admit: 2017-08-26 | Discharge: 2017-08-26 | Disposition: A | Discharge status: Home / Self Care | Payer: Medicare Other | Source: Ambulatory Visit | Attending: Family Medicine | Admitting: Family Medicine

## 2017-08-26 DIAGNOSIS — I714 Abdominal aortic aneurysm, without rupture, unspecified: Secondary | ICD-10-CM

## 2017-09-07 ENCOUNTER — Other Ambulatory Visit: Payer: Self-pay | Admitting: Family Medicine

## 2017-09-22 DIAGNOSIS — H401131 Primary open-angle glaucoma, bilateral, mild stage: Secondary | ICD-10-CM | POA: Diagnosis not present

## 2017-10-05 ENCOUNTER — Other Ambulatory Visit: Payer: Self-pay | Admitting: Family Medicine

## 2017-11-11 ENCOUNTER — Other Ambulatory Visit: Payer: Self-pay | Admitting: Family Medicine

## 2017-11-11 NOTE — Progress Notes (Deleted)
Subjective:   [name] is a 81 y.o. male who presents for Medicare Annual/Subsequent preventive examination.  Reports health as  Diet Chol 134; hdl 47 ldl 65; trig 109  Exercise  BMI   There are no preventive care reminders to display for this patient.  Cancer Screenings Former smoker; until 36' with 20 pack years Colonoscopy 04/2013 PSA  10/2010    Objective:    Vitals: There were no vitals taken for this visit.  There is no height or weight on file to calculate BMI.  Advanced Directives 05/30/2014 01/24/2014 08/27/2013  Does Patient Have a Medical Advance Directive? Patient has advance directive, copy not in chart Patient has advance directive, copy in chart Patient has advance directive, copy not in chart  Type of Advance Directive Living will Living will Living will  Does patient want to make changes to medical advance directive? No change requested - -  Copy of [place] in Chart? (No Data) - Copy requested from family  Pre-existing out of facility DNR order (yellow form or pink MOST form) - - No    Tobacco Social History   Tobacco Use  Smoking Status Former Smoker  . Packs/day: 2.00  . Years: 10.00  . Pack years: 20.00  . Types: Cigarettes  . Last attempt to quit: 12/03/1994  . Years since quitting: 22.9  Smokeless Tobacco Never Used     Counseling given: Not Answered   Clinical Intake:      Past Medical History:  Diagnosis Date  . At risk for sleep apnea    STOP-BANG= 6    SENT TO PCP 05-25-2014  . Benign positional vertigo   . Bladder cancer ([place])   . Coronary artery disease CARDIOLOGIST--  DR Acie Fredrickson  . ED (erectile dysfunction)   . GERD (gastroesophageal reflux disease)   . H/O hiatal hernia   . History of anal fissures   . History of colon polyps   . History of radiation therapy 08/24/12-10/22/12   prostate 6600cGy/33 sessions--  for biochemical recurrence  . Hyperlipidemia   . Hypertension   . OA (osteoarthritis)     . Recurrent prostate adenocarcinoma ([place]) first dx 09/2005--  radial prostatectomy-- gleason 4+3=7, pT2c, negative margin   04/2012--  BIOCHEMICAL  RECURRENCE to 0.33 /  SALVAGE IMRT SEPT to  NOV 2013  . S/P CABG x 2    09/  2014  . S/P dilatation of esophageal stricture    1992  . Sigmoid diverticulosis   . SUI (stress urinary incontinence), male   . Urethral stricture   . Wears glasses    Past Surgical History:  Procedure Laterality Date  . CARDIAC CATHETERIZATION  08-27-2013  DR Martinique   CRITICAL OSTIAL LM STENOSIS/  diffuse disease LAD 40-50%  &   pLCx 30-40%/  . CARDIOVASCULAR STRESS TEST  08-18-2013  DR NASHER   SMALL/ MEDIUM AREA MILD SCAR INFEROSEPTAL WALL/ NO ISCHEMIA/  LOW RISK SCAN/  EF 62%/  LV WALL MOTION WITH SLIGHT DECREASED OF THE SEPTUM  . CORONARY ANGIOPLASTY  1991   PTCA  of LEFT CFX  . CORONARY ARTERY BYPASS GRAFT N/A 08/27/2013   Procedure: CORONARY ARTERY BYPASS GRAFTING (CABG) times two using left internal mammary and right saphenous vein using endoscope.;  Surgeon: Melrose Nakayama, MD;  Location: [place];  Service: Open Heart Surgery;  Laterality: N/A;  . CYSTOSCOPY WITH URETHRAL DILATATION N/A 01/26/2014   Procedure: CYSTOSCOPY WITH URETHRAL DILATATION, BLADDER BIOPSY, FOLEY CATHETER;  Surgeon: Molli Hazard, MD;  [facility];  Service: Urology;  Laterality: N/A;  . INGUINAL HERNIA REPAIR Bilateral 1994  . KNEE ARTHROSCOPY Right   . LEFT HEART CATHETERIZATION WITH CORONARY ANGIOGRAM N/A 08/27/2013   Procedure: LEFT HEART CATHETERIZATION WITH CORONARY ANGIOGRAM;  Surgeon: Peter M Martinique, MD;  Location: [facility] CATH LAB;  Service: Cardiovascular;  Laterality: N/A;  . LUMBAR DISC SURGERY  1980'S  . RADICAL RETROPUBIC PROSTATECTOMY W/ BILATERAL PELVIC NODE DISSECTION  11-18-2005  . URETHROTOMY N/A 05/30/2014   Procedure: CYSTOSCOPY  BALLOON DILATION AND DIRECT VISION INTERNAL URETHROTOMY;  Surgeon: Sharyn Creamer, MD;  Location: [facility];  Service: Urology;  Laterality: N/A;   Family History  Problem Relation Age of Onset  . Cancer Father        prostate died 42 something  . Cancer Brother        prostate cancer  . Arthritis Other   . Hyperlipidemia Other   . Hypertension Other    Social History   Socioeconomic History  . Marital status: Married    Spouse name: Not on file  . Number of children: 2  . Years of education: 43  . Highest education level: Not on file  Social Needs  . Financial resource strain: Not on file  . Food insecurity - worry: Not on file  . Food insecurity - inability: Not on file  . Transportation needs - medical: Not on file  . Transportation needs - non-medical: Not on file  Occupational History  . Occupation: Retired    Fish farm manager: RETIRED  Tobacco Use  . Smoking status: Former Smoker    Packs/day: 2.00    Years: 10.00    Pack years: 20.00    Types: Cigarettes    Last attempt to quit: 12/03/1994    Years since quitting: 22.9  . Smokeless tobacco: Never Used  Substance and Sexual Activity  . Alcohol use: Yes    Comment: occasionally  . Drug use: No  . Sexual activity: Not on file  Other Topics Concern  . Not on file  Social History Narrative   Lives alone   Caffeine use: Coffee- 3 cups daily   Soda sometimes.     Outpatient Encounter Medications as of 11/12/2017  Medication Sig  . aspirin 81 MG EC tablet Take 1 tablet (81 mg total) by mouth daily.  [name] atorvastatin (LIPITOR) 40 MG tablet TAKE 1 TABLET(40 MG) BY MOUTH AT BEDTIME  . latanoprost (XALATAN) 0.005 % ophthalmic solution   . metoprolol succinate (TOPROL-XL) 25 MG 24 hr tablet Take 1 tablet (25 mg total) by mouth daily.  . Multiple Vitamin (MULTIVITAMIN WITH MINERALS) TABS tablet Take 1 tablet by mouth daily.  . nitroGLYCERIN (NITROSTAT) 0.4 MG SL tablet PLACE 1 TABLET UNDER THE TONUE EVERY 5 MINUTES AS NEEDED FOR CHEST PAIN.  [name] pantoprazole (PROTONIX) 40 MG tablet TAKE 1 TABLET BY MOUTH EVERY OTHER DAY IN THE  MORNING.  [name] polyethylene glycol (MIRALAX / GLYCOLAX) packet Take 17 g by mouth daily as needed (for constipation).   . sertraline (ZOLOFT) 50 MG tablet TAKE 1 TABLET(50 MG) BY MOUTH DAILY  . vitamin B-12 (CYANOCOBALAMIN) 100 MCG tablet Take 100 mcg by mouth daily.   No facility-administered encounter medications on file as of 11/12/2017.     Activities of Daily Living No flowsheet data found.  Timed Get Up and Go Performed: ***  Patient Care Team: [name], MD as PCP - Jefm Petty, MD  as Consulting Physician (Ophthalmology)   Assessment:    *** Exercise Activities and Dietary recommendations    Goals    None     Fall Risk Fall Risk  11/08/2016 09/07/2015 08/10/2015 08/04/2014  Falls in the past year? No No No No  Comment Emmi Telephone Survey: data to providers prior to load - - -   Is the patient's home free of loose throw rugs in walkways, pet beds, electrical cords, etc?   {Blank single:19197::"yes","no"}      Grab bars in the bathroom? {Blank single:19197::"yes","no"}      Handrails on the stairs?   {Blank single:19197::"yes","no"}      Adequate lighting?   {Blank single:19197::"yes","no"} Depression Screen PHQ 2/9 Scores 07/18/2017 09/07/2015 08/04/2014 01/05/2014  PHQ - 2 Score 1 2 0 0  PHQ- 9 Score - 3 - -    Cognitive Function        Immunization History  Administered Date(s) Administered  . Influenza Split 08/25/2012  . Influenza Whole 08/16/2010  . Influenza, High Dose Seasonal PF 08/12/2016, 08/20/2017  . Influenza,inj,Quad PF,6+ Mos 08/24/2013, 08/04/2014, 08/10/2015  . Pneumococcal Conjugate-13 08/04/2014  . Pneumococcal Polysaccharide-23 12/05/2004  . Td 11/08/2010  . Zoster Recombinat (Shingrix) 10/25/2017   Screening Tests Health Maintenance  Topic Date Due  . TETANUS/TDAP  11/08/2020  . INFLUENZA VACCINE  Completed  . PNA vac Low Risk Adult  Completed    Additional Screenings: *** Hepatitis B/HIV/Syphillis: Hepatitis C  Screening:     Plan:   ***  I have personally reviewed and noted the following in the patient's chart:   . Medical and social history . Use of alcohol, tobacco or illicit drugs  . Current medications and supplements . Functional ability and status . Nutritional status . Physical activity . Advanced directives . List of other physicians . Hospitalizations, surgeries, and ER visits in previous 12 months . Vitals . Screenings to include cognitive, depression, and falls . Referrals and appointments  In addition, I have reviewed and discussed with patient certain preventive protocols, quality metrics, and best practice recommendations. A written personalized care plan for preventive services as well as general preventive health recommendations were provided to patient.     Wynetta Fines, RN  11/11/2017

## 2017-11-12 ENCOUNTER — Ambulatory Visit: Payer: Medicare Other

## 2017-11-12 ENCOUNTER — Ambulatory Visit (INDEPENDENT_AMBULATORY_CARE_PROVIDER_SITE_OTHER): Payer: Medicare Other | Admitting: Family Medicine

## 2017-11-12 ENCOUNTER — Encounter: Payer: Self-pay | Admitting: Family Medicine

## 2017-11-12 VITALS — BP 120/80 | HR 68 | Temp 98.1°F | Wt 152.4 lb

## 2017-11-12 DIAGNOSIS — M25522 Pain in left elbow: Secondary | ICD-10-CM

## 2017-11-12 DIAGNOSIS — M545 Low back pain, unspecified: Secondary | ICD-10-CM

## 2017-11-12 DIAGNOSIS — I2510 Atherosclerotic heart disease of native coronary artery without angina pectoris: Secondary | ICD-10-CM

## 2017-11-12 MED ORDER — METHOCARBAMOL 500 MG PO TABS: 500.0000 mg | ORAL_TABLET | Freq: Three times a day (TID) | ORAL | 0 refills | Status: DC | PRN

## 2017-11-12 NOTE — Progress Notes (Signed)
Subjective:     Patient ID: [name], male   DOB: 28-Feb-1936, 81 y.o.   MRN: [id number]  HPI   Patient seen following a fall yesterday. He was in the shower and stood up on a bench to try to get a closer look at what he thought was a cobweb in the corner. He slipped and fell and struck his left elbow. He thinks he also bumped his head with NO loss of consciousness. He fell down onto his buttocks and today has bilateral lower lumbar back pain. He has some stiffness. He tried some heat and also took some Aleve with minimal relief. Elbow pain is improved.  He's not seen any swelling.  Denies any headaches or confusion. Ambulating without difficulty. No hip pain.  Past Medical History:  Diagnosis Date  . At risk for sleep apnea    STOP-BANG= 6    SENT TO PCP 05-25-2014  . Benign positional vertigo   . Bladder cancer ([place])   . Coronary artery disease CARDIOLOGIST--  DR Acie Fredrickson  . ED (erectile dysfunction)   . GERD (gastroesophageal reflux disease)   . H/O hiatal hernia   . History of anal fissures   . History of colon polyps   . History of radiation therapy 08/24/12-10/22/12   prostate 6600cGy/33 sessions--  for biochemical recurrence  . Hyperlipidemia   . Hypertension   . OA (osteoarthritis)   . Recurrent prostate adenocarcinoma ([place]) first dx 09/2005--  radial prostatectomy-- gleason 4+3=7, pT2c, negative margin   04/2012--  BIOCHEMICAL  RECURRENCE to 0.33 /  SALVAGE IMRT SEPT to  NOV 2013  . S/P CABG x 2    09/  2014  . S/P dilatation of esophageal stricture    1992  . Sigmoid diverticulosis   . SUI (stress urinary incontinence), male   . Urethral stricture   . Wears glasses    Past Surgical History:  Procedure Laterality Date  . CARDIAC CATHETERIZATION  08-27-2013  DR Martinique   CRITICAL OSTIAL LM STENOSIS/  diffuse disease LAD 40-50%  &   pLCx 30-40%/  . CARDIOVASCULAR STRESS TEST  08-18-2013  DR NASHER   SMALL/ MEDIUM AREA MILD SCAR INFEROSEPTAL WALL/ NO ISCHEMIA/  LOW  RISK SCAN/  EF 62%/  LV WALL MOTION WITH SLIGHT DECREASED OF THE SEPTUM  . CORONARY ANGIOPLASTY  1991   PTCA  of LEFT CFX  . CORONARY ARTERY BYPASS GRAFT N/A 08/27/2013   Procedure: CORONARY ARTERY BYPASS GRAFTING (CABG) times two using left internal mammary and right saphenous vein using endoscope.;  Surgeon: Melrose Nakayama, MD;  Location: [place];  Service: Open Heart Surgery;  Laterality: N/A;  . CYSTOSCOPY WITH URETHRAL DILATATION N/A 01/26/2014   Procedure: CYSTOSCOPY WITH URETHRAL DILATATION, BLADDER BIOPSY, FOLEY CATHETER;  Surgeon: Molli Hazard, MD;  [facility];  Service: Urology;  Laterality: N/A;  . INGUINAL HERNIA REPAIR Bilateral 1994  . KNEE ARTHROSCOPY Right   . LEFT HEART CATHETERIZATION WITH CORONARY ANGIOGRAM N/A 08/27/2013   Procedure: LEFT HEART CATHETERIZATION WITH CORONARY ANGIOGRAM;  Surgeon: Peter M Martinique, MD;  Location: [facility] CATH LAB;  Service: Cardiovascular;  Laterality: N/A;  . LUMBAR DISC SURGERY  1980'S  . RADICAL RETROPUBIC PROSTATECTOMY W/ BILATERAL PELVIC NODE DISSECTION  11-18-2005  . URETHROTOMY N/A 05/30/2014   Procedure: CYSTOSCOPY  BALLOON DILATION AND DIRECT VISION INTERNAL URETHROTOMY;  Surgeon: Sharyn Creamer, MD;  Location: [facility];  Service: Urology;  Laterality: N/A;    reports that he quit  smoking about 22 years ago. His smoking use included cigarettes. He has a 20.00 pack-year smoking history. he has never used smokeless tobacco. He reports that he drinks alcohol. He reports that he does not use drugs. family history includes Arthritis in his other; Cancer in his brother and father; Hyperlipidemia in his other; Hypertension in his other. Allergies  Allergen Reactions  . Bee Venom Other (See Comments)    Reaction unknown  . Amoxicillin Rash  . Hydrocodone Other (See Comments)    REACTION: disorientation     Review of Systems  Constitutional: Negative for fatigue.  Eyes: Negative for visual disturbance.   Respiratory: Negative for cough, chest tightness and shortness of breath.   Cardiovascular: Negative for chest pain, palpitations and leg swelling.  Musculoskeletal: Positive for back pain.  Neurological: Negative for dizziness, syncope, weakness, light-headedness and headaches.  Psychiatric/Behavioral: Negative for confusion.       Objective:   Physical Exam  Constitutional: He is oriented to person, place, and time. He appears well-developed and well-nourished.  Cardiovascular: Normal rate and regular rhythm.  Pulmonary/Chest: Effort normal and breath sounds normal. No respiratory distress. He has no wheezes. He has no rales.  Musculoskeletal: He exhibits no edema.  No spinal tenderness  Left elbow is non-tender with exception of very small area of tenderness distal olecranon process. He has no olecranon bursa swelling. Full range of motion.  Neurological: He is alert and oriented to person, place, and time.       Assessment:     #1 bilateral lumbar back pain following fall yesterday. Low clinical suspicion for acute lumbar compression fracture  #2 left elbow pain. Suspect contusion    Plan:     -Continue with Aleve as needed -Cautious trial of Robaxin 500 mg daily at bedtime for back stiffness also suggested continue with heat and simple stretches as tolerated -Follow-up immediately for any weakness or numbness or other new neurologic symptoms  [name] MD [facility] at [facility]

## 2017-11-19 ENCOUNTER — Other Ambulatory Visit: Payer: Self-pay | Admitting: Family Medicine

## 2017-11-19 NOTE — Telephone Encounter (Signed)
Refill once 

## 2017-11-20 NOTE — Telephone Encounter (Signed)
Rx done. 

## 2017-11-26 ENCOUNTER — Ambulatory Visit (INDEPENDENT_AMBULATORY_CARE_PROVIDER_SITE_OTHER)
Admission: RE | Admit: 2017-11-26 | Discharge: 2017-11-26 | Disposition: A | Discharge status: Home / Self Care | Payer: Medicare Other | Source: Ambulatory Visit | Attending: Family Medicine | Admitting: Family Medicine

## 2017-11-26 ENCOUNTER — Encounter: Payer: Self-pay | Admitting: Family Medicine

## 2017-11-26 ENCOUNTER — Ambulatory Visit (INDEPENDENT_AMBULATORY_CARE_PROVIDER_SITE_OTHER): Payer: Medicare Other | Admitting: Family Medicine

## 2017-11-26 ENCOUNTER — Other Ambulatory Visit: Payer: Self-pay | Admitting: Family Medicine

## 2017-11-26 VITALS — BP 130/70 | HR 58 | Temp 97.7°F | Ht 67.0 in | Wt 151.8 lb

## 2017-11-26 DIAGNOSIS — M545 Low back pain, unspecified: Secondary | ICD-10-CM

## 2017-11-26 DIAGNOSIS — I2510 Atherosclerotic heart disease of native coronary artery without angina pectoris: Secondary | ICD-10-CM

## 2017-11-26 MED ORDER — METAXALONE 800 MG PO TABS: 800.0000 mg | ORAL_TABLET | Freq: Three times a day (TID) | ORAL | 0 refills | Status: DC

## 2017-11-26 MED ORDER — MELOXICAM 15 MG PO TABS: 15.0000 mg | ORAL_TABLET | Freq: Every day | ORAL | 0 refills | Status: DC

## 2017-11-26 NOTE — Patient Instructions (Signed)
Go for X-ray of lumbar spine, as discussed.

## 2017-11-26 NOTE — Progress Notes (Signed)
Subjective:     Patient ID: [name], male   DOB: [birth year], 81 y.o.   MRN: [id number]  HPI Patient seen with persistent lumbar back pain. Refer to previous note. He fell on 11/11/17 while he was in the shower after standing up on a stool. He did not have any localized tenderness then. His pain is somewhat poorly localized but lower lumbar area and somewhat bilateral. He had fairly constant pain. Some relief with ice. No relief with heat. He tried muscle relaxer with Robaxin without any improvement. Pain is worse with flexing the back but he has some pain with sitting and standing. No definite weakness. No urine or stool incontinence.  He had lumbar disc surgery several years ago.  Past Medical History:  Diagnosis Date  . At risk for sleep apnea    STOP-BANG= 6    SENT TO PCP 05-25-2014  . Benign positional vertigo   . Bladder cancer ([place])   . Coronary artery disease CARDIOLOGIST--  DR Acie Fredrickson  . ED (erectile dysfunction)   . GERD (gastroesophageal reflux disease)   . H/O hiatal hernia   . History of anal fissures   . History of colon polyps   . History of radiation therapy 08/24/12-10/22/12   prostate 6600cGy/33 sessions--  for biochemical recurrence  . Hyperlipidemia   . Hypertension   . OA (osteoarthritis)   . Recurrent prostate adenocarcinoma ([place]) first dx 09/2005--  radial prostatectomy-- gleason 4+3=7, pT2c, negative margin   04/2012--  BIOCHEMICAL  RECURRENCE to 0.33 /  SALVAGE IMRT SEPT to  NOV 2013  . S/P CABG x 2    09/  2014  . S/P dilatation of esophageal stricture    1992  . Sigmoid diverticulosis   . SUI (stress urinary incontinence), male   . Urethral stricture   . Wears glasses    Past Surgical History:  Procedure Laterality Date  . CARDIAC CATHETERIZATION  08-27-2013  DR Martinique   CRITICAL OSTIAL LM STENOSIS/  diffuse disease LAD 40-50%  &   pLCx 30-40%/  . CARDIOVASCULAR STRESS TEST  08-18-2013  DR NASHER   SMALL/ MEDIUM AREA MILD SCAR INFEROSEPTAL  WALL/ NO ISCHEMIA/  LOW RISK SCAN/  EF 62%/  LV WALL MOTION WITH SLIGHT DECREASED OF THE SEPTUM  . CORONARY ANGIOPLASTY  1991   PTCA  of LEFT CFX  . CORONARY ARTERY BYPASS GRAFT N/A 08/27/2013   Procedure: CORONARY ARTERY BYPASS GRAFTING (CABG) times two using left internal mammary and right saphenous vein using endoscope.;  Surgeon: Melrose Nakayama, MD;  Location: [place];  Service: Open Heart Surgery;  Laterality: N/A;  . CYSTOSCOPY WITH URETHRAL DILATATION N/A 01/26/2014   Procedure: CYSTOSCOPY WITH URETHRAL DILATATION, BLADDER BIOPSY, FOLEY CATHETER;  Surgeon: Molli Hazard, MD;  Location[facility];  Service: Urology;  Laterality: N/A;  . INGUINAL HERNIA REPAIR Bilateral 1994  . KNEE ARTHROSCOPY Right   . LEFT HEART CATHETERIZATION WITH CORONARY ANGIOGRAM N/A 08/27/2013   Procedure: LEFT HEART CATHETERIZATION WITH CORONARY ANGIOGRAM;  Surgeon: Peter M Martinique, MD;  Location[facility];  Service: Cardiovascular;  Laterality: N/A;  . LUMBAR DISC SURGERY  1980'S  . RADICAL RETROPUBIC PROSTATECTOMY W/ BILATERAL PELVIC NODE DISSECTION  11-18-2005  . URETHROTOMY N/A 05/30/2014   Procedure: CYSTOSCOPY  BALLOON DILATION AND DIRECT VISION INTERNAL URETHROTOMY;  Surgeon: Sharyn Creamer, MD;  Location: [facility];  Service: Urology;  Laterality: N/A;    reports that he quit smoking about 22 years ago. His  smoking use included cigarettes. He has a 20.00 pack-year smoking history. he has never used smokeless tobacco. He reports that he drinks alcohol. He reports that he does not use drugs. family history includes Arthritis in his other; Cancer in his brother and father; Hyperlipidemia in his other; Hypertension in his other. Allergies  Allergen Reactions  . Bee Venom Other (See Comments)    Reaction unknown  . Amoxicillin Rash  . Hydrocodone Other (See Comments)    REACTION: disorientation     Review of Systems  Constitutional: Negative for activity change, appetite  change and fever.  Respiratory: Negative for cough and shortness of breath.   Cardiovascular: Negative for chest pain and leg swelling.  Gastrointestinal: Negative for abdominal pain and vomiting.  Genitourinary: Negative for dysuria, flank pain and hematuria.  Musculoskeletal: Positive for back pain. Negative for joint swelling.  Neurological: Negative for weakness and numbness.       Objective:   Physical Exam  Constitutional: He is oriented to person, place, and time. He appears well-developed and well-nourished. No distress.  Neck: No thyromegaly present.  Cardiovascular: Normal rate, regular rhythm and normal heart sounds.  No murmur heard. Pulmonary/Chest: Effort normal and breath sounds normal. No respiratory distress. He has no wheezes. He has no rales.  Musculoskeletal: He exhibits no edema.  No spinal tenderness. No visible ecchymosis.  Neurological: He is alert and oriented to person, place, and time. He has normal reflexes. No cranial nerve deficit.  Skin: No rash noted.       Assessment:     Low back pain following fall. Nonfocal neuro exam    Plan:     -Given history of trauma we'll go ahead with x-rays lumbosacral spine -Meloxicam 15 mg 1 daily as needed and take with food -Patient requesting trial of Skelaxin 800 mg which he thinks worked better than the Robaxin. Take 1 every 8 hours as needed for muscle spasm  [name] MD [facility]

## 2017-11-27 ENCOUNTER — Telehealth: Payer: Self-pay | Admitting: Family Medicine

## 2017-11-27 NOTE — Telephone Encounter (Signed)
Only if this seems to be helping with his pain.

## 2017-11-27 NOTE — Telephone Encounter (Signed)
Copied from [place] [id number]. Topic: Quick Communication - See Telephone Encounter >> Nov 27, 2017  2:54 PM Ether Griffins B wrote: CRM for notification. See Telephone encounter for:  Pt needing to know if he needs to continue talking metaxalone 800 mg.  11/27/17.

## 2017-11-27 NOTE — Telephone Encounter (Signed)
I called the pt and informed him of the message below. 

## 2017-12-01 ENCOUNTER — Telehealth: Payer: Self-pay | Admitting: *Deleted

## 2017-12-01 NOTE — Telephone Encounter (Signed)
Copied from [place] [id number]. Topic: Inquiry >> Dec 01, 2017 10:11 AM Corie Chiquito, [place] wrote: Reason for CRM: Patient calling to let Dr.Burchette know that he has already an made an appointment to see the orthopedic doctor. If anyone needs to speak with him about the appointment please give him a call at [phone]  Noted

## 2017-12-08 ENCOUNTER — Other Ambulatory Visit: Payer: Self-pay | Admitting: Cardiovascular Disease

## 2017-12-09 DIAGNOSIS — M4856XA Collapsed vertebra, not elsewhere classified, lumbar region, initial encounter for fracture: Secondary | ICD-10-CM | POA: Diagnosis not present

## 2017-12-23 ENCOUNTER — Other Ambulatory Visit: Payer: Self-pay | Admitting: Family Medicine

## 2017-12-23 NOTE — Telephone Encounter (Signed)
Refill once 

## 2018-01-04 ENCOUNTER — Other Ambulatory Visit: Payer: Self-pay | Admitting: Family Medicine

## 2018-01-06 DIAGNOSIS — M4856XD Collapsed vertebra, not elsewhere classified, lumbar region, subsequent encounter for fracture with routine healing: Secondary | ICD-10-CM | POA: Diagnosis not present

## 2018-01-19 DIAGNOSIS — H401131 Primary open-angle glaucoma, bilateral, mild stage: Secondary | ICD-10-CM | POA: Diagnosis not present

## 2018-01-28 DIAGNOSIS — M4856XD Collapsed vertebra, not elsewhere classified, lumbar region, subsequent encounter for fracture with routine healing: Secondary | ICD-10-CM | POA: Diagnosis not present

## 2018-02-02 DIAGNOSIS — Z8546 Personal history of malignant neoplasm of prostate: Secondary | ICD-10-CM | POA: Diagnosis not present

## 2018-02-08 ENCOUNTER — Other Ambulatory Visit: Payer: Self-pay | Admitting: Family Medicine

## 2018-02-09 DIAGNOSIS — Z8551 Personal history of malignant neoplasm of bladder: Secondary | ICD-10-CM | POA: Diagnosis not present

## 2018-02-09 DIAGNOSIS — Z8546 Personal history of malignant neoplasm of prostate: Secondary | ICD-10-CM | POA: Diagnosis not present

## 2018-02-09 DIAGNOSIS — N320 Bladder-neck obstruction: Secondary | ICD-10-CM | POA: Diagnosis not present

## 2018-02-16 DIAGNOSIS — M545 Low back pain: Secondary | ICD-10-CM | POA: Diagnosis not present

## 2018-02-23 DIAGNOSIS — M545 Low back pain: Secondary | ICD-10-CM | POA: Diagnosis not present

## 2018-03-02 DIAGNOSIS — M545 Low back pain: Secondary | ICD-10-CM | POA: Diagnosis not present

## 2018-03-11 DIAGNOSIS — M4856XD Collapsed vertebra, not elsewhere classified, lumbar region, subsequent encounter for fracture with routine healing: Secondary | ICD-10-CM | POA: Diagnosis not present

## 2018-03-17 ENCOUNTER — Ambulatory Visit (INDEPENDENT_AMBULATORY_CARE_PROVIDER_SITE_OTHER): Payer: Medicare Other | Admitting: Cardiovascular Disease

## 2018-03-17 ENCOUNTER — Encounter: Payer: Self-pay | Admitting: Cardiovascular Disease

## 2018-03-17 VITALS — BP 165/90 | HR 65 | Ht 68.0 in | Wt 148.4 lb

## 2018-03-17 DIAGNOSIS — I2510 Atherosclerotic heart disease of native coronary artery without angina pectoris: Secondary | ICD-10-CM

## 2018-03-17 DIAGNOSIS — I10 Essential (primary) hypertension: Secondary | ICD-10-CM

## 2018-03-17 DIAGNOSIS — E782 Mixed hyperlipidemia: Secondary | ICD-10-CM | POA: Diagnosis not present

## 2018-03-17 MED ORDER — CARVEDILOL 12.5 MG PO TABS: 12.5000 mg | ORAL_TABLET | Freq: Two times a day (BID) | ORAL | 3 refills | Status: DC

## 2018-03-17 NOTE — Progress Notes (Signed)
[name] Date of Birth  04-Apr-1936 [place] [address]    [place]   [place], [place]  [address]    [place], [place]  [address] [phone]  Fax  [phone]  [phone]  Fax [phone]  Problem list: 1. Coronary artery disease- status post remote PTCA to the left complex artery in 1991, CABG 2014 2. Hypertension 3. Hyperlipidemia 4. Prostate Cancer.  History of Present Illness:  Hosea is  a 82 y.o. -year-old gentleman with a history of PTCA approximately 22 years ago.  He's not had any episodes of chest pain or shortness of breath. He stays fairly active although he doesn't exercise at the gym.  He has some problems with some arthritis-type pain in his right hip.  The Lipitor causes a little bit of muscular skeletal pain.  May 10, 2013:  He stopped hib Benicar 2 months ago. His BP has been running low at home.  He has been on a new medicine for his prostate and was concerned that his BP would run too low.   He has been taking a varied dose of benicar - 1 tab, 1/2 tab, 1/4 tablet whenever he thinks he needs it.   Sept. 5, 2014:  Shae presents today for worsening shortness of breath and chest pain.  Last Saturday, he mowed the lawn and spread 200 lbs of lime.  He developed severe CP and started to take some NTG.  The pain finally eased up. Several days later, he had recurrent CP while cleaning out the gutters.    He thinks he was just overheated.   He has been recording his BP and has been getting some low readings.     He has been having some hip pain and does not know if he will be able to walk on the treadmill.   Sept. 24, 2014: Atzel was seen recently with some worsening chest pain. He had a stress Myoview study which was interpreted as low risk he has a fixed inferoapical defect.  He exertion, he is still having angina.  Lasts 5 minutes. In the center of his chest. No radiatioin.  Occurs only with  exertion ( stating mower or Rototiller- never walking around the house.)   Class II - III angina.    Yesterday, he had some mild baseline CP which worsened while walking around the grocery store.   Dec. 16, 2014:  Seyed has had CABG since I last saw him in the office.  Still has chest tenderness - breathing is good, no CP  Chest wall is still tender.  May 11, 2014:  Zhaire is doing ok.   Jan. 4, 2015: Doing well  July 21, 2015:  Doing well from a cardiac standpoint  Still lonely ,  Mentally not where he needs to be since the death of his wife .   Visits with his 2 daughters frequently    Feb. 16, 2017:  Still grieving over the death of his wife , 16 months ago .   Were married 62 years.  Got some counseling at [facility] but was not as effective as he would like    Aug. 16, 2017: No CP or dyspnea  Has easy bruising on his arms due to the ASA walks some on the treadmill  No CP   Feb. 12, 2018  Has had some visual issues. Had a head MRI yesterday .   No CP or  dyspnea   Current Outpatient Medications on File Prior to Visit  Medication Sig Dispense Refill  . aspirin 81 MG EC tablet Take 1 tablet (81 mg total) by mouth daily.    [name] atorvastatin (LIPITOR) 40 MG tablet TAKE 1 TABLET(40 MG) BY MOUTH AT BEDTIME 90 tablet 1  . latanoprost (XALATAN) 0.005 % ophthalmic solution Place 1 drop into both eyes daily.     . metoprolol succinate (TOPROL-XL) 25 MG 24 hr tablet Take 1 tablet (25 mg total) by mouth daily. Please call and schedule a one year follow up appointment for further refills 1st attempt 90 tablet 0  . Multiple Vitamin (MULTIVITAMIN WITH MINERALS) TABS tablet Take 1 tablet by mouth daily.    . nitroGLYCERIN (NITROSTAT) 0.4 MG SL tablet PLACE 1 TABLET UNDER THE TONUE EVERY 5 MINUTES AS NEEDED FOR CHEST PAIN. 75 tablet 3  . pantoprazole (PROTONIX) 40 MG tablet TAKE 1 TABLET BY MOUTH EVERY OTHER DAY IN THE MORNING. 90 tablet 0  . polyethylene glycol (MIRALAX / GLYCOLAX)  packet Take 17 g by mouth daily as needed (for constipation).     . sertraline (ZOLOFT) 50 MG tablet TAKE 1 TABLET(50 MG) BY MOUTH DAILY 90 tablet 0  . vitamin B-12 (CYANOCOBALAMIN) 100 MCG tablet Take 100 mcg by mouth daily.     No current facility-administered medications on file prior to visit.     Allergies  Allergen Reactions  . Bee Venom Other (See Comments)    Reaction unknown  . Amoxicillin Rash  . Hydrocodone Other (See Comments)    REACTION: disorientation    Past Medical History:  Diagnosis Date  . At risk for sleep apnea    STOP-BANG= 6    SENT TO PCP 05-25-2014  . Benign positional vertigo   . Bladder cancer ([place])   . Coronary artery disease CARDIOLOGIST--  DR Acie Fredrickson  . ED (erectile dysfunction)   . GERD (gastroesophageal reflux disease)   . H/O hiatal hernia   . History of anal fissures   . History of colon polyps   . History of radiation therapy 08/24/12-10/22/12   prostate 6600cGy/33 sessions--  for biochemical recurrence  . Hyperlipidemia   . Hypertension   . OA (osteoarthritis)   . Recurrent prostate adenocarcinoma ([place]) first dx 09/2005--  radial prostatectomy-- gleason 4+3=7, pT2c, negative margin   04/2012--  BIOCHEMICAL  RECURRENCE to 0.33 /  SALVAGE IMRT SEPT to  NOV 2013  . S/P CABG x 2    09/  2014  . S/P dilatation of esophageal stricture    1992  . Sigmoid diverticulosis   . SUI (stress urinary incontinence), male   . Urethral stricture   . Wears glasses     Past Surgical History:  Procedure Laterality Date  . CARDIAC CATHETERIZATION  08-27-2013  DR Martinique   CRITICAL OSTIAL LM STENOSIS/  diffuse disease LAD 40-50%  &   pLCx 30-40%/  . CARDIOVASCULAR STRESS TEST  08-18-2013  DR NASHER   SMALL/ MEDIUM AREA MILD SCAR INFEROSEPTAL WALL/ NO ISCHEMIA/  LOW RISK SCAN/  EF 62%/  LV WALL MOTION WITH SLIGHT DECREASED OF THE SEPTUM  . CORONARY ANGIOPLASTY  1991   PTCA  of LEFT CFX  . CORONARY ARTERY BYPASS GRAFT N/A 08/27/2013   Procedure: CORONARY  ARTERY BYPASS GRAFTING (CABG) times two using left internal mammary and right saphenous vein using endoscope.;  Surgeon: Melrose Nakayama, MD;  Location: [place];  Service: Open Heart Surgery;  Laterality: N/A;  . CYSTOSCOPY  WITH URETHRAL DILATATION N/A 01/26/2014   Procedure: CYSTOSCOPY WITH URETHRAL DILATATION, BLADDER BIOPSY, FOLEY CATHETER;  Surgeon: Molli Hazard, MD;  [facility];  Service: Urology;  Laterality: N/A;  . INGUINAL HERNIA REPAIR Bilateral 1994  . KNEE ARTHROSCOPY Right   . LEFT HEART CATHETERIZATION WITH CORONARY ANGIOGRAM N/A 08/27/2013   Procedure: LEFT HEART CATHETERIZATION WITH CORONARY ANGIOGRAM;  Surgeon: Peter M Martinique, MD;  Location: [facility] CATH LAB;  Service: Cardiovascular;  Laterality: N/A;  . LUMBAR DISC SURGERY  1980'S  . RADICAL RETROPUBIC PROSTATECTOMY W/ BILATERAL PELVIC NODE DISSECTION  11-18-2005  . URETHROTOMY N/A 05/30/2014   Procedure: CYSTOSCOPY  BALLOON DILATION AND DIRECT VISION INTERNAL URETHROTOMY;  Surgeon: Sharyn Creamer, MD;  Location[facility];  Service: Urology;  Laterality: N/A;    Social History   Tobacco Use  Smoking Status Former Smoker  . Packs/day: 2.00  . Years: 10.00  . Pack years: 20.00  . Types: Cigarettes  . Last attempt to quit: 12/03/1994  . Years since quitting: 23.3  Smokeless Tobacco Never Used    Social History   Substance and Sexual Activity  Alcohol Use Yes   Comment: occasionally    Family History  Problem Relation Age of Onset  . Cancer Father        prostate died 5 something  . Cancer Brother        prostate cancer  . Arthritis Other   . Hyperlipidemia Other   . Hypertension Other     Reviw of Systems:   Physical Exam: Blood pressure (!) 184/88, pulse 65, height 5\' 8"  (1.727 m), weight 148 lb 6.4 oz (67.3 kg), SpO2 99 %.  GEN:  Well nourished, well developed in no acute distress HEENT: Normal NECK: No JVD; No carotid bruits LYMPHATICS: No lymphadenopathy CARDIAC:  RRR , soft systolic murmur. RESPIRATORY: Clear.  I  ABDOMEN: Good bowel sounds.  He has a pulsatile midline mass consistent with an abdominal aortic aneurysm. MUSCULOSKELETAL:  No edema; No deformity  SKIN: Warm and dry NEUROLOGIC:  Alert and oriented x 3   ECG:  March 17, 2018:   NSR at 67.      Assessment / Plan:   1. Coronary artery disease-no episodes of angina.  2. Hypertension -  -blood pressure is elevated today.  We discussed adding a diuretic but he states that he has some serious bladder issues and does not think that he would tolerate a diuretic.  We will stop the metoprolol and start him on carvedilol 12.5 mg twice a day.  Will refer him to hypertension clinic for further evaluation and management.  3. Hyperlipidemia-we will check fasting lipids later this week.  4. Prostate Cancer. 5. Abdominal aortic aneurism  -  The AAA is stable by [place] September, 2016.    Abdominal US inSept. 2018 shows AAA of 4.4 cm  We will repeat in 1 year.    Mertie Moores, MD  03/17/2018 4:46 PM    [address],  [place], [place]  [address] Pager [phone] Phone: [phone]; Fax: [phone]

## 2018-03-17 NOTE — Patient Instructions (Addendum)
Medication Instructions:  Your physician has recommended you make the following change in your medication:   STOP Metoprolol  START Carvedilol (Coreg) 12.5 mg twice daily   Labwork: Your physician recommends that you return for lab work tomorrow - come in anytime after 7:30 am You will need to FAST for this appointment - nothing to eat or drink after midnight the night before except water or black coffee.   Testing/Procedures: None Ordered   Follow-Up: You have been referred to Hypertension Clinic in 1 month   Your physician wants you to follow-up in: 1 year with Dr. Acie Fredrickson. You will receive a reminder letter in the mail two months in advance. If you don't receive a letter, please call our office to schedule the follow-up appointment.   If you need a refill on your cardiac medications before your next appointment, please call your pharmacy.   Thank you for choosing [facility]! Christen Bame, RN [phone]

## 2018-03-18 ENCOUNTER — Other Ambulatory Visit: Payer: Medicare Other | Admitting: *Deleted

## 2018-03-18 DIAGNOSIS — I2510 Atherosclerotic heart disease of native coronary artery without angina pectoris: Secondary | ICD-10-CM

## 2018-03-18 DIAGNOSIS — E782 Mixed hyperlipidemia: Secondary | ICD-10-CM

## 2018-03-18 DIAGNOSIS — I10 Essential (primary) hypertension: Secondary | ICD-10-CM

## 2018-03-18 LAB — LIPID PANEL
Chol/HDL Ratio: 2.6 ratio (ref 0.0–5.0)
Cholesterol, Total: 134 mg/dL (ref 100–199)
HDL: 51 mg/dL (ref >39)
LDL Calculated: 65 mg/dL (ref 0–99)
Triglycerides: 90 mg/dL (ref 0–149)
VLDL Cholesterol Cal: 18 mg/dL (ref 5–40)

## 2018-03-18 LAB — BASIC METABOLIC PANEL
BUN/Creatinine Ratio: 10 (ref 10–24)
BUN: 12 mg/dL (ref 8–27)
CO2: 26 mmol/L (ref 20–29)
Calcium: 9.5 mg/dL (ref 8.6–10.2)
Chloride: 101 mmol/L (ref 96–106)
Creatinine, Ser: 1.26 mg/dL (ref 0.76–1.27)
GFR calc Af Amer: 62 mL/min/{1.73_m2} (ref >59)
GFR calc non Af Amer: 54 mL/min/{1.73_m2} — ABNORMAL LOW (ref >59)
Glucose: 87 mg/dL (ref 65–99)
Potassium: 5 mmol/L (ref 3.5–5.2)
Sodium: 140 mmol/L (ref 134–144)

## 2018-03-18 LAB — HEPATIC FUNCTION PANEL
ALT: 26 IU/L (ref 0–44)
AST: 24 IU/L (ref 0–40)
Albumin: 4.3 g/dL (ref 3.5–4.7)
Alkaline Phosphatase: 84 IU/L (ref 39–117)
Bilirubin Total: 0.4 mg/dL (ref 0.0–1.2)
Bilirubin, Direct: 0.16 mg/dL (ref 0.00–0.40)
Total Protein: 6.7 g/dL (ref 6.0–8.5)

## 2018-03-23 ENCOUNTER — Telehealth: Payer: Self-pay | Admitting: Cardiovascular Disease

## 2018-03-23 NOTE — Telephone Encounter (Signed)
New Message:      Pt c/o medication issue:  1. Name of Medication: carvedilol (COREG) 12.5 MG tablet  2. How are you currently taking this medication (dosage and times per day)?  Take 1 tablet (12.5 mg total) by mouth 2 (two) times daily. 3. Are you having a reaction (difficulty breathing--STAT)? No  4. What is your medication issue? Pt states that he has had a decrease in his urine passage since he has started taking this medication.

## 2018-03-23 NOTE — Telephone Encounter (Signed)
I do not think the decreased urine output is related to the Carvedilol.

## 2018-03-23 NOTE — Telephone Encounter (Signed)
Spoke with patient who states urinary output has decreased since starting carvedilol. He reports 35 oz urine output during the night (almost 12 hours) after drinking 2 glasses of water and 2 cups of coffee at 6 pm. He states he has had some bladder and prostate problems and has had a prostatectomy. Had a surgery to remove scarring from bladder and hx bladder leaking. Reports regular urologist appointments @ [facility], last one was 1 month ago with no reported issues. I advised that decreased urinary output is not a commonly reported issue with carvedilol but I will review with our pharmacists and with Dr. Acie Fredrickson will call him back with their input. His last bmet on 4/17 showed normal kidney function. He thanked me for the call.

## 2018-03-23 NOTE — Telephone Encounter (Signed)
Agree that decrease in urine output should not be related to his carvedilol.

## 2018-03-24 NOTE — Telephone Encounter (Signed)
Called patient and advised of no known problems with urinary retention with carvedilol per Dr. Acie Fredrickson and [place], [facility]. He states he does not think he was drinking enough water and will increase water intake; I advised him to consume at least 64 oz of water daily. I advised him to call back with additional questions or concerns and he thanked me for the call.

## 2018-04-12 ENCOUNTER — Other Ambulatory Visit: Payer: Self-pay | Admitting: Family Medicine

## 2018-04-13 ENCOUNTER — Ambulatory Visit (INDEPENDENT_AMBULATORY_CARE_PROVIDER_SITE_OTHER): Payer: Medicare Other | Admitting: Family Medicine

## 2018-04-13 VITALS — BP 110/80 | HR 57 | Temp 98.1°F | Wt 148.5 lb

## 2018-04-13 DIAGNOSIS — W57XXXA Bitten or stung by nonvenomous insect and other nonvenomous arthropods, initial encounter: Secondary | ICD-10-CM

## 2018-04-13 DIAGNOSIS — S90862A Insect bite (nonvenomous), left foot, initial encounter: Secondary | ICD-10-CM | POA: Diagnosis not present

## 2018-04-13 DIAGNOSIS — I2510 Atherosclerotic heart disease of native coronary artery without angina pectoris: Secondary | ICD-10-CM

## 2018-04-13 NOTE — Patient Instructions (Signed)
Keep foot clean with soap and water  Leave off alcohol and hydrogen peroxide.  Follow up for any increased redness, warmth, or pus like drainage  Also follow up for any fever or new rash.

## 2018-04-13 NOTE — Progress Notes (Signed)
Subjective:     Patient ID: [name], male   DOB: 09-Jul-1936, 82 y.o.   MRN: [id number]  HPI  Patient seen following tick bite which he just noticed yesterday which apparently was between left second and third toe. He noted small blister afterwards. Slight itching. No pain. He apparently applied some hydroperoxide. Tick was removed and very small -sounded more like a deer tick. He has not had any other skin rash. No arthralgias. No headache. Fever.  Past Medical History:  Diagnosis Date  . At risk for sleep apnea    STOP-BANG= 6    SENT TO PCP 05-25-2014  . Benign positional vertigo   . Bladder cancer ([place])   . Coronary artery disease CARDIOLOGIST--  DR Acie Fredrickson  . ED (erectile dysfunction)   . GERD (gastroesophageal reflux disease)   . H/O hiatal hernia   . History of anal fissures   . History of colon polyps   . History of radiation therapy 08/24/12-10/22/12   prostate 6600cGy/33 sessions--  for biochemical recurrence  . Hyperlipidemia   . Hypertension   . OA (osteoarthritis)   . Recurrent prostate adenocarcinoma ([place]) first dx 09/2005--  radial prostatectomy-- gleason 4+3=7, pT2c, negative margin   04/2012--  BIOCHEMICAL  RECURRENCE to 0.33 /  SALVAGE IMRT SEPT to  NOV 2013  . S/P CABG x 2    09/  2014  . S/P dilatation of esophageal stricture    1992  . Sigmoid diverticulosis   . SUI (stress urinary incontinence), male   . Urethral stricture   . Wears glasses    Past Surgical History:  Procedure Laterality Date  . CARDIAC CATHETERIZATION  08-27-2013  DR Martinique   CRITICAL OSTIAL LM STENOSIS/  diffuse disease LAD 40-50%  &   pLCx 30-40%/  . CARDIOVASCULAR STRESS TEST  08-18-2013  DR NASHER   SMALL/ MEDIUM AREA MILD SCAR INFEROSEPTAL WALL/ NO ISCHEMIA/  LOW RISK SCAN/  EF 62%/  LV WALL MOTION WITH SLIGHT DECREASED OF THE SEPTUM  . CORONARY ANGIOPLASTY  1991   PTCA  of LEFT CFX  . CORONARY ARTERY BYPASS GRAFT N/A 08/27/2013   Procedure: CORONARY ARTERY BYPASS GRAFTING  (CABG) times two using left internal mammary and right saphenous vein using endoscope.;  Surgeon: Melrose Nakayama, MD;  Location: [place];  Service: Open Heart Surgery;  Laterality: N/A;  . CYSTOSCOPY WITH URETHRAL DILATATION N/A 01/26/2014   Procedure: CYSTOSCOPY WITH URETHRAL DILATATION, BLADDER BIOPSY, FOLEY CATHETER;  Surgeon: Molli Hazard, MD;  [facility];  Service: Urology;  Laterality: N/A;  . INGUINAL HERNIA REPAIR Bilateral 1994  . KNEE ARTHROSCOPY Right   . LEFT HEART CATHETERIZATION WITH CORONARY ANGIOGRAM N/A 08/27/2013   Procedure: LEFT HEART CATHETERIZATION WITH CORONARY ANGIOGRAM;  Surgeon: Peter M Martinique, MD;  Location: [facility] C[facility] LAB;  Service: Cardiovascular;  Laterality: N/A;  . LUMBAR DISC SURGERY  1980'S  . RADICAL RETROPUBIC PROSTATECTOMY W/ BILATERAL PELVIC NODE DISSECTION  11-18-2005  . URETHROTOMY N/A 05/30/2014   Procedure: CYSTOSCOPY  BALLOON DILATION AND DIRECT VISION INTERNAL URETHROTOMY;  Surgeon: Sharyn Creamer, MD;  Location: [facility];  Service: Urology;  Laterality: N/A;    reports that he quit smoking about 23 years ago. His smoking use included cigarettes. He has a 20.00 pack-year smoking history. He has never used smokeless tobacco. He reports that he drinks alcohol. He reports that he does not use drugs. family history includes Arthritis in his other; Cancer in his brother and father;  Hyperlipidemia in his other; Hypertension in his other. Allergies  Allergen Reactions  . Bee Venom Other (See Comments)    Reaction unknown  . Amoxicillin Rash  . Hydrocodone Other (See Comments)    REACTION: disorientation    Review of Systems  Constitutional: Negative for chills and fever.  Musculoskeletal: Negative for arthralgias.  Skin: Negative for rash.  Neurological: Negative for headaches.       Objective:   Physical Exam  Constitutional: He appears well-developed and well-nourished.  Cardiovascular: Normal rate and regular  rhythm.  Pulmonary/Chest: Effort normal and breath sounds normal.  Skin:  Patient has small open area at the base of the left second toe. There is no erythema. No drainage. No warmth. Nontender. No visible retained tick parts       Assessment:     Reported tick bite left foot between the second third toes with reported blister. Blister has been unroofed at this time.  No signs of secondary infection.    Plan:     -Follow-up promptly for any fever, arthralgias, new skin rash, or any cellulitis changes -Keep area clean with soap and water. Leave off peroxide  [name] MD  Primary Care at [facility]

## 2018-04-16 ENCOUNTER — Ambulatory Visit (INDEPENDENT_AMBULATORY_CARE_PROVIDER_SITE_OTHER): Payer: Medicare Other | Admitting: Pharmacist

## 2018-04-16 VITALS — BP 152/74 | HR 56

## 2018-04-16 DIAGNOSIS — I10 Essential (primary) hypertension: Secondary | ICD-10-CM

## 2018-04-16 DIAGNOSIS — I2510 Atherosclerotic heart disease of native coronary artery without angina pectoris: Secondary | ICD-10-CM

## 2018-04-16 MED ORDER — AMLODIPINE BESYLATE 5 MG PO TABS: 5.0000 mg | ORAL_TABLET | Freq: Every day | ORAL | 11 refills | Status: DC

## 2018-04-16 NOTE — Patient Instructions (Addendum)
It was nice to meet you today  Start taking amlodipine 5mg  once a day for your blood pressure  Continue taking your other medications  Try to cut back on your coffee intake  Continue to check your blood pressure at home  Follow up in clinic for a blood pressure check in 1 month. Please bring in your home blood pressure cuff to this visit, as well as your home blood pressure readings

## 2018-04-16 NOTE — Progress Notes (Signed)
Patient ID: [name]                 DOB: 05/16/1936                      MRN: [id number]     HPI: [name] is a 82 y.o. male referred by Dr. Acie Fredrickson to HTN clinic. PMH is significant for CAD s/p PTCA in 1991 and CABG in 2014, HTN, HLD, and prostate cancer. His BP was elevated to 165/90 in the office last month, and metoprolol was switched to carvedilol for better BP lowering. He had follow up with his PCP 3 days ago and BP was much improved at 110/80. He presents today for follow up.  Patient reports tolerating his carvedilol well. He has checked his BP at home with most systolic BP readings ranging 150-160. He denies dizziness, falls, or blurred vision. He has been having some headaches although they have been chronic over the past > 1 month. He prefers to avoid diuretics due to bladder issues. He drinks 5-6 cups of caffeinated coffee each day and has had 3-4 so far today.  Current HTN meds: carvedilol 12.5mg  BID (8-9am and 10pm) Previously tried: metoprolol - switched to carvedilol, olmesartan BP goal: <130/20mmHg  Family History: Non contributory  Social History: Former smoker 2 PPD for 10 years, quit in 1996. Occasional alcohol use, denies illicit drug use.  Diet: Likes vegetables, occasionally eats meat. Does not add salt to food. Drinks 5-6 cups of caffeinated coffee per day.  Exercise: Walks on the treadmill for 15 minutes twice a day  Home BP readings: Systolic BP 315-176 at home, however pt has had cuff for 20 years.   Wt Readings from Last 3 Encounters:  04/13/18 148 lb 8 oz (67.4 kg)  03/17/18 148 lb 6.4 oz (67.3 kg)  11/26/17 151 lb 12.8 oz (68.9 kg)   BP Readings from Last 3 Encounters:  04/13/18 110/80  03/17/18 (!) 165/90  11/26/17 130/70   Pulse Readings from Last 3 Encounters:  04/13/18 (!) 57  03/17/18 65  11/26/17 (!) 58    Renal function: CrCl cannot be calculated (Patient's most recent lab result is older than the maximum 21 days  allowed.).  Past Medical History:  Diagnosis Date  . At risk for sleep apnea    STOP-BANG= 6    SENT TO PCP 05-25-2014  . Benign positional vertigo   . Bladder cancer ([place])   . Coronary artery disease CARDIOLOGIST--  DR Acie Fredrickson  . ED (erectile dysfunction)   . GERD (gastroesophageal reflux disease)   . H/O hiatal hernia   . History of anal fissures   . History of colon polyps   . History of radiation therapy 08/24/12-10/22/12   prostate 6600cGy/33 sessions--  for biochemical recurrence  . Hyperlipidemia   . Hypertension   . OA (osteoarthritis)   . Recurrent prostate adenocarcinoma ([place]) first dx 09/2005--  radial prostatectomy-- gleason 4+3=7, pT2c, negative margin   04/2012--  BIOCHEMICAL  RECURRENCE to 0.33 /  SALVAGE IMRT SEPT to  NOV 2013  . S/P CABG x 2    09/  2014  . S/P dilatation of esophageal stricture    1992  . Sigmoid diverticulosis   . SUI (stress urinary incontinence), male   . Urethral stricture   . Wears glasses     Current Outpatient Medications on File Prior to Visit  Medication Sig Dispense Refill  . aspirin 81 MG EC tablet Take  1 tablet (81 mg total) by mouth daily.    [name] atorvastatin (LIPITOR) 40 MG tablet TAKE 1 TABLET(40 MG) BY MOUTH AT BEDTIME 90 tablet 1  . carvedilol (COREG) 12.5 MG tablet Take 1 tablet (12.5 mg total) by mouth 2 (two) times daily. 180 tablet 3  . latanoprost (XALATAN) 0.005 % ophthalmic solution Place 1 drop into both eyes daily.     . Multiple Vitamin (MULTIVITAMIN WITH MINERALS) TABS tablet Take 1 tablet by mouth daily.    . nitroGLYCERIN (NITROSTAT) 0.4 MG SL tablet PLACE 1 TABLET UNDER THE TONUE EVERY 5 MINUTES AS NEEDED FOR CHEST PAIN. 75 tablet 3  . pantoprazole (PROTONIX) 40 MG tablet TAKE 1 TABLET BY MOUTH EVERY OTHER DAY IN THE MORNING. 90 tablet 0  . polyethylene glycol (MIRALAX / GLYCOLAX) packet Take 17 g by mouth daily as needed (for constipation).     . sertraline (ZOLOFT) 50 MG tablet TAKE 1 TABLET BY MOUTH DAILY 90  tablet 0  . vitamin B-12 (CYANOCOBALAMIN) 100 MCG tablet Take 100 mcg by mouth daily.     No current facility-administered medications on file prior to visit.     Allergies  Allergen Reactions  . Bee Venom Other (See Comments)    Reaction unknown  . Amoxicillin Rash  . Hydrocodone Other (See Comments)    REACTION: disorientation     Assessment/Plan:  1. Hypertension - BP above goal <130/77mmHg today. Will continue carvedilol 12.5mg  BID as low HR is preventing further titration. Will start amlodipine 5mg  daily. Advised pt to monitor for any LEE and to record his BP at home. Advised him to bring in home readings and BP cuff to next visit. F/u in HTN clinic in 4 weeks.   Megan E. Supple, PharmD, CPP, [facility] [address], [place], [place] [address] Phone: [phone]; Fax: [phone] 04/16/2018 2:17 PM

## 2018-05-11 ENCOUNTER — Other Ambulatory Visit: Payer: Self-pay | Admitting: Family Medicine

## 2018-05-14 ENCOUNTER — Ambulatory Visit (INDEPENDENT_AMBULATORY_CARE_PROVIDER_SITE_OTHER): Payer: Medicare Other | Admitting: Pharmacist

## 2018-05-14 VITALS — BP 142/68 | HR 55

## 2018-05-14 DIAGNOSIS — I10 Essential (primary) hypertension: Secondary | ICD-10-CM

## 2018-05-14 NOTE — Progress Notes (Signed)
Patient ID: [name]                 DOB: 10/15/1936                      MRN: [id number]     HPI: [name] is a 82 y.o. male referred by Dr. Acie Fredrickson to HTN clinic. PMH is significant for CAD s/p PTCA in 1991 and CABG in 2014, HTN, HLD, and prostate cancer. His BP was elevated to 165/90 in the office in April, and metoprolol was switched to carvedilol for better BP lowering. At last HTN visit, BP remained elevated at 152/74 and amlodipine 5mg  was started.  Patient reports tolerating his amlodipine well. He has checked his BP at home with a large improvement in BP readings from 496-759 systolic to 163-846/65-99, HR upper 50s to low 60s. He denies dizziness, falls, blurred vision, or LEE. He has cut back on his intake of caffeinated coffee from 5-6 cups a day down to 4 cups. Of note, he prefers to avoid diuretics due to bladder issues.  Home BP cuff is measuring accurately with home cuff reading of 148/63 and clinic reading of 142/68.  Current HTN meds: carvedilol 12.5mg  BID (8-9am and 10pm), amlodipine 5mg  daily (AM) Previously tried: metoprolol - switched to carvedilol, olmesartan BP goal: <130/21mmHg  Family History: Non contributory  Social History: Former smoker 2 PPD for 10 years, quit in 1996. Occasional alcohol use, denies illicit drug use.  Diet: Likes vegetables, occasionally eats meat. Does not add salt to food. Drinks 5-6 cups of caffeinated coffee per day.  Exercise: Walks on the treadmill for 15 minutes twice a day  Home BP readings: Improved from 150-160 to 357-017B systolic  Wt Readings from Last 3 Encounters:  04/13/18 148 lb 8 oz (67.4 kg)  03/17/18 148 lb 6.4 oz (67.3 kg)  11/26/17 151 lb 12.8 oz (68.9 kg)   BP Readings from Last 3 Encounters:  04/16/18 (!) 152/74  04/13/18 110/80  03/17/18 (!) 165/90   Pulse Readings from Last 3 Encounters:  04/16/18 (!) 56  04/13/18 (!) 57  03/17/18 65    Renal function: CrCl cannot be calculated (Patient's  most recent lab result is older than the maximum 21 days allowed.).  Past Medical History:  Diagnosis Date  . At risk for sleep apnea    STOP-BANG= 6    SENT TO PCP 05-25-2014  . Benign positional vertigo   . Bladder cancer ([place])   . Coronary artery disease CARDIOLOGIST--  DR Acie Fredrickson  . ED (erectile dysfunction)   . GERD (gastroesophageal reflux disease)   . H/O hiatal hernia   . History of anal fissures   . History of colon polyps   . History of radiation therapy 08/24/12-10/22/12   prostate 6600cGy/33 sessions--  for biochemical recurrence  . Hyperlipidemia   . Hypertension   . OA (osteoarthritis)   . Recurrent prostate adenocarcinoma ([place]) first dx 09/2005--  radial prostatectomy-- gleason 4+3=7, pT2c, negative margin   04/2012--  BIOCHEMICAL  RECURRENCE to 0.33 /  SALVAGE IMRT SEPT to  NOV 2013  . S/P CABG x 2    09/  2014  . S/P dilatation of esophageal stricture    1992  . Sigmoid diverticulosis   . SUI (stress urinary incontinence), male   . Urethral stricture   . Wears glasses     Current Outpatient Medications on File Prior to Visit  Medication Sig Dispense Refill  .  amLODipine (NORVASC) 5 MG tablet Take 1 tablet (5 mg total) by mouth daily. 30 tablet 11  . aspirin 81 MG EC tablet Take 1 tablet (81 mg total) by mouth daily.    [name] atorvastatin (LIPITOR) 40 MG tablet TAKE 1 TABLET(40 MG) BY MOUTH AT BEDTIME 90 tablet 0  . carvedilol (COREG) 12.5 MG tablet Take 1 tablet (12.5 mg total) by mouth 2 (two) times daily. 180 tablet 3  . latanoprost (XALATAN) 0.005 % ophthalmic solution Place 1 drop into both eyes daily.     . Multiple Vitamin (MULTIVITAMIN WITH MINERALS) TABS tablet Take 1 tablet by mouth daily.    . nitroGLYCERIN (NITROSTAT) 0.4 MG SL tablet PLACE 1 TABLET UNDER THE TONUE EVERY 5 MINUTES AS NEEDED FOR CHEST PAIN. 75 tablet 3  . pantoprazole (PROTONIX) 40 MG tablet TAKE 1 TABLET BY MOUTH EVERY OTHER DAY IN THE MORNING. 90 tablet 0  . polyethylene glycol (MIRALAX  / GLYCOLAX) packet Take 17 g by mouth daily as needed (for constipation).     . sertraline (ZOLOFT) 50 MG tablet TAKE 1 TABLET BY MOUTH DAILY 90 tablet 0  . vitamin B-12 (CYANOCOBALAMIN) 100 MCG tablet Take 100 mcg by mouth daily.     No current facility-administered medications on file prior to visit.     Allergies  Allergen Reactions  . Bee Venom Other (See Comments)    Reaction unknown  . Amoxicillin Rash  . Hydrocodone Other (See Comments)    REACTION: disorientation     Assessment/Plan:  1. Hypertension - BP above goal in clinic today, however all home BP readings have improved to goal < 130/68mmHg with BP at home ranging 110-130/60-104mmHg. Will continue current medications including amlodipine 5mg  daily and carvedilol 12.5mg  BID. Advised pt to monitor his BP at home and to call clinic if he notices his BP readings increase back to > 130/1mmHg consistently. F/u in HTN clinic as needed.   Megan E. Supple, PharmD, CPP, [facility] [address], [place], [place] [address] Phone: [phone]; Fax: [phone] 05/14/2018 7:52 AM

## 2018-05-14 NOTE — Patient Instructions (Addendum)
It was nice to see you today  Your blood pressure goal is less than 130/80  Continue to take your carvedilol twice daily and amlodipine once a day  Monitor your blood pressure at home and call clinic with any concerns or if you notice your blood pressure consistently remaining elevated

## 2018-05-18 DIAGNOSIS — H401131 Primary open-angle glaucoma, bilateral, mild stage: Secondary | ICD-10-CM | POA: Diagnosis not present

## 2018-06-13 ENCOUNTER — Encounter: Payer: Self-pay | Admitting: Family Medicine

## 2018-06-13 ENCOUNTER — Ambulatory Visit (INDEPENDENT_AMBULATORY_CARE_PROVIDER_SITE_OTHER): Payer: Medicare Other | Admitting: Family Medicine

## 2018-06-13 VITALS — BP 122/60 | HR 57 | Temp 98.1°F | Wt 149.8 lb

## 2018-06-13 DIAGNOSIS — L03115 Cellulitis of right lower limb: Secondary | ICD-10-CM

## 2018-06-13 DIAGNOSIS — T63461A Toxic effect of venom of wasps, accidental (unintentional), initial encounter: Secondary | ICD-10-CM

## 2018-06-13 DIAGNOSIS — I2510 Atherosclerotic heart disease of native coronary artery without angina pectoris: Secondary | ICD-10-CM | POA: Diagnosis not present

## 2018-06-13 MED ORDER — DOXYCYCLINE HYCLATE 100 MG PO TABS: 100.0000 mg | ORAL_TABLET | Freq: Two times a day (BID) | ORAL | 0 refills | Status: DC

## 2018-06-13 NOTE — Patient Instructions (Signed)
Bee, Wasp, or [employer], Adult Bees, wasps, and hornets are part of a family of insects that can sting people. These stings can cause pain and inflammation, but they are usually not serious. However, some people may have an allergic reaction to a sting. This can cause the symptoms to be more severe. What increases the risk? You may be at a greater risk of getting stung if you:  Provoke a stinging insect by swatting or disturbing it.  Wear strong-smelling soaps, deodorants, or body sprays.  Spend time outdoors near gardens with flowers or fruit trees or in clothes that expose skin.  Eat or drink outside.  What are the signs or symptoms? Common symptoms of this condition include:  A red lump in the skin that sometimes has a tiny hole in the center. In some cases, a stinger may be in the center of the wound.  Pain and itching at the sting site.  Redness and swelling around the sting site. If you have an allergic reaction (localized allergic reaction), the swelling and redness may spread out from the sting site. In some cases, this reaction can continue to develop over the next 24-48 hours.  In rare cases, a person may have a severe allergic reaction (anaphylactic reaction) to a sting. Symptoms of an anaphylactic reaction may include:  Wheezing or difficulty breathing.  Raised, itchy, red patches on the skin (hives).  Nausea or vomiting.  Abdominal cramping.  Diarrhea.  Tightness in the chest or chest pain.  Dizziness or fainting.  Redness of the face (flushing).  Hoarse voice.  Swollen tongue, lips, or face.  How is this diagnosed? This condition is usually diagnosed based on your symptoms and medical history as well as a physical exam. You may have an allergy test to determine if you are allergic to the substance that the insect injected during the sting (venom). How is this treated? If you were stung by a bee, the stinger and a small sac of venom may be in the wound.  It is important to remove the stinger as soon as possible. You can do this by brushing across the wound with gauze, a fingernail, or a flat card such as a credit card. Removing the stinger can help reduce the severity of your body's reaction to the sting. Most stings can be treated with:  Icing to reduce swelling in the area.  Medicines (antihistamines) to treat itching or an allergic reaction.  Medicines to help reduce pain. These may be medicines that you take by mouth, or medicated creams or lotions that you apply to your skin.  Pay close attention to your symptoms after you have been stung. If possible, have someone stay with you to make sure you do not have an allergic reaction. If you have any signs of an allergic reaction, call your health care provider. If you have ever had a severe allergic reaction, your health care provider may give you an inhaler or injectable medicine (epinephrine auto-injector) to use if necessary. Follow these instructions at home:  Wash the sting site 2-3 times each day with soap and water as told by your health care provider.  Apply or take over-the-counter and prescription medicines only as told by your health care provider.  If directed, apply ice to the sting area. ? Put ice in a plastic bag. ? Place a towel between your skin and the bag. ? Leave the ice on for 20 minutes, 2-3 times a day.  Do not scratch the sting  area.  If you had a severe allergic reaction to a sting, you may need: ? To wear a medical bracelet or necklace that lists the allergy. ? To learn when and how to use an anaphylaxis kit or epinephrine injection. Your family members and coworkers may also need to learn this. ? To carry an anaphylaxis kit or epinephrine injection with you at all times. How is this prevented?  Avoid swatting at stinging insects and disturbing insect nests.  Do not use fragrant soaps or lotions.  Wear shoes, pants, and long sleeves when spending time  outdoors, especially in grassy areas where stinging insects are common.  Keep outdoor areas free from nests or hives.  Keep food and drink containers covered when eating outdoors.  Avoid working or sitting near flowering plants, if possible.  Wear gloves if you are gardening or working outdoors.  If an attack by a stinging insect or a swarm seems likely in the moment, move away from the area or find a barrier between you and the insect(s), such as a door. Contact a health care provider if:  Your symptoms do not get better in 2-3 days.  You have redness, swelling, or pain that spreads beyond the area of the sting.  You have a fever. Get help right away if: You have symptoms of a severe allergic reaction. These include:  Wheezing or difficulty breathing.  Tightness in the chest or chest pain.  Light-headedness or fainting.  Itchy, raised, red patches on the skin.  Nausea or vomiting.  Abdominal cramping.  Diarrhea.  A swollen tongue or lips, or trouble swallowing.  Dizziness or fainting.  Summary  Stings from bees, wasps, and hornets can cause pain and inflammation, but they are usually not serious. However, some people may have an allergic reaction to a sting. This can cause the symptoms to be more severe.  Pay close attention to your symptoms after you have been stung. If possible, have someone stay with you to make sure you do not have an allergic reaction.  Call your health care provider if you have any signs of an allergic reaction. This information is not intended to replace advice given to you by your health care provider. Make sure you discuss any questions you have with your health care provider. Document Released: 11/18/2005 Document Revised: 01/23/2017 Document Reviewed: 01/23/2017 Elsevier Interactive Patient Education  2018 [employer] Inc[employer]  

## 2018-06-13 NOTE — Progress Notes (Signed)
Check labs.  Adjust meds prnCheck labs.  Adjust meds prn Patient ID: [name], male    DOB: 1936-07-07  Age: 82 y.o. MRN: [id number]    Subjective:  Subjective  HPI [name] presents for wasp sting on Monday.  On R knee-- it swelled but went down --- it is now red.  No itchy--- warm to touch--- no drainage   Review of Systems Review of Systems  Constitutional: Negative for activity change, appetite change and fatigue.  HENT: Negative for hearing loss, congestion, tinnitus and ear discharge.  dentist q71m Eyes: Negative for visual disturbance (see optho q1y -- vision corrected to 20/20 with glasses).  Respiratory: Negative for cough, chest tightness and shortness of breath.   Cardiovascular: Negative for chest pain, palpitations and leg swelling.  Gastrointestinal: Negative for abdominal pain, diarrhea, constipation and abdominal distention.  Genitourinary: Negative for urgency, frequency, decreased urine volume and difficulty urinating.  Musculoskeletal: Negative for back pain, arthralgias and gait problem.  Skin: Negative for color change, pallor and insect bite on knee, errythema Neurological: Negative for dizziness, light-headedness, numbness and headaches.  Hematological: Negative for adenopathy. Does not bruise/bleed easily.  Psychiatric/Behavioral: Negative for suicidal ideas, confusion, sleep disturbance, self-injury, dysphoric mood, decreased concentration and agitation.      History Past Medical History:  Diagnosis Date  . At risk for sleep apnea    STOP-BANG= 6    SENT TO PCP 05-25-2014  . Benign positional vertigo   . Bladder cancer ([place])   . Coronary artery disease CARDIOLOGIST--  DR Acie Fredrickson  . ED (erectile dysfunction)   . GERD (gastroesophageal reflux disease)   . H/O hiatal hernia   . History of anal fissures   . History of colon polyps   . History of radiation therapy 08/24/12-10/22/12   prostate 6600cGy/33 sessions--  for biochemical recurrence  .  Hyperlipidemia   . Hypertension   . OA (osteoarthritis)   . Recurrent prostate adenocarcinoma ([place]) first dx 09/2005--  radial prostatectomy-- gleason 4+3=7, pT2c, negative margin   04/2012--  BIOCHEMICAL  RECURRENCE to 0.33 /  SALVAGE IMRT SEPT to  NOV 2013  . S/P CABG x 2    09/  2014  . S/P dilatation of esophageal stricture    1992  . Sigmoid diverticulosis   . SUI (stress urinary incontinence), male   . Urethral stricture   . Wears glasses     He has a past surgical history that includes Cystoscopy with urethral dilatation (N/A, 01/26/2014); Knee arthroscopy (Right); Lumbar disc surgery (1980'S); Cardiovascular stress test (08-18-2013  DR NASHER); Coronary angioplasty (1991); Cardiac catheterization (08-27-2013  DR [place]); Coronary artery bypass graft (N/A, 08/27/2013); Inguinal hernia repair (Bilateral, 1994); RADICAL RETROPUBIC PROSTATECTOMY W/ BILATERAL PELVIC NODE DISSECTION (11-18-2005); Urethrotomy (N/A, 05/30/2014); and left heart catheterization with coronary angiogram (N/A, 08/27/2013).   His family history includes Arthritis in his other; Cancer in his brother and father; Hyperlipidemia in his other; Hypertension in his other.He reports that he quit smoking about 23 years ago. His smoking use included cigarettes. He has a 20.00 pack-year smoking history. He has never used smokeless tobacco. He reports that he drinks alcohol. He reports that he does not use drugs.  Current Outpatient Medications on File Prior to Visit  Medication Sig Dispense Refill  . amLODipine (NORVASC) 5 MG tablet Take 1 tablet (5 mg total) by mouth daily. 30 tablet 11  . aspirin 81 MG EC tablet Take 1 tablet (81 mg total) by mouth daily.    [name]  atorvastatin (LIPITOR) 40 MG tablet TAKE 1 TABLET(40 MG) BY MOUTH AT BEDTIME 90 tablet 0  . carvedilol (COREG) 12.5 MG tablet Take 1 tablet (12.5 mg total) by mouth 2 (two) times daily. 180 tablet 3  . latanoprost (XALATAN) 0.005 % ophthalmic solution Place 1 drop into  both eyes daily.     . Multiple Vitamin (MULTIVITAMIN WITH MINERALS) TABS tablet Take 1 tablet by mouth daily.    . nitroGLYCERIN (NITROSTAT) 0.4 MG SL tablet PLACE 1 TABLET UNDER THE TONUE EVERY 5 MINUTES AS NEEDED FOR CHEST PAIN. 75 tablet 3  . pantoprazole (PROTONIX) 40 MG tablet TAKE 1 TABLET BY MOUTH EVERY OTHER DAY IN THE MORNING. 90 tablet 0  . polyethylene glycol (MIRALAX / GLYCOLAX) packet Take 17 g by mouth daily as needed (for constipation).     . sertraline (ZOLOFT) 50 MG tablet TAKE 1 TABLET BY MOUTH DAILY 90 tablet 0  . vitamin B-12 (CYANOCOBALAMIN) 100 MCG tablet Take 100 mcg by mouth daily.     No current facility-administered medications on file prior to visit.      Objective:  Objective  Physical Exam  Constitutional: He is oriented to person, place, and time. Vital signs are normal. He appears well-developed and well-nourished. He is sleeping.  HENT:  Head: Normocephalic and atraumatic.  Mouth/Throat: Oropharynx is clear and moist.  Eyes: Pupils are equal, round, and reactive to light. EOM are normal.  Neck: Normal range of motion. Neck supple. No thyromegaly present.  Cardiovascular: Normal rate and regular rhythm.  No murmur heard. Pulmonary/Chest: Effort normal and breath sounds normal. No respiratory distress. He has no wheezes. He has no rales. He exhibits no tenderness.  Musculoskeletal: He exhibits no edema or tenderness.       Right knee: He exhibits swelling and erythema.       Legs: Neurological: He is alert and oriented to person, place, and time.  Skin: Skin is warm and dry.  Psychiatric: He has a normal mood and affect. His behavior is normal. Judgment and thought content normal.  Nursing note and vitals reviewed.  BP 122/60   Pulse (!) 57   Temp 98.1 F (36.7 C) (Oral)   Wt 149 lb 12.8 oz (67.9 kg)   SpO2 99%   BMI 22.78 kg/m  Wt Readings from Last 3 Encounters:  06/13/18 149 lb 12.8 oz (67.9 kg)  04/13/18 148 lb 8 oz (67.4 kg)  03/17/18 148  lb 6.4 oz (67.3 kg)     Lab Results  Component Value Date   WBC 6.2 05/12/2017   HGB 13.6 05/12/2017   HCT 40.7 05/12/2017   PLT 160.0 05/12/2017   GLUCOSE 87 03/18/2018   CHOL 134 03/18/2018   TRIG 90 03/18/2018   HDL 51 03/18/2018   LDLCALC 65 03/18/2018   ALT 26 03/18/2018   AST 24 03/18/2018   NA 140 03/18/2018   K 5.0 03/18/2018   CL 101 03/18/2018   CREATININE 1.26 03/18/2018   BUN 12 03/18/2018   CO2 26 03/18/2018   TSH 0.93 10/28/2012   PSA 0.07 (L) 10/31/2010   INR 1.70 (H) 08/27/2013    Dg Lumbar Spine Complete  Result Date: 11/26/2017 CLINICAL DATA:  Fall x 2 wks ago, lower back pain since. Hx of lower back sx long time ago. EXAM: LUMBAR SPINE - COMPLETE 4+ VIEW COMPARISON:  None. FINDINGS: Compression fracture deformity of L2 with approximately 40% loss of height anteriorly, age indeterminate. No retropulsion is evident on plain film radiography.  Alignment is preserved. There is mild narrowing of the L3-4 interspace, moderate narrowing L5-S1. Small anterior endplate spurs A5-W9. Patchy aortic calcifications without suggestion of aneurysm. IMPRESSION: 1. L2 compression fracture deformity, age indeterminate. 2. Mild degenerative change L3-S1 as above. 3.  Aortic Atherosclerosis (ICD10-170.0) Electronically Signed   By: Lucrezia Europe M.D.   On: 11/26/2017 16:44     Assessment & Plan:  Plan  I am having Emerald C. Ziehm start on doxycycline. I am also having him maintain his polyethylene glycol, multivitamin with minerals, aspirin, nitroGLYCERIN, vitamin B-12, pantoprazole, latanoprost, carvedilol, sertraline, amLODipine, and atorvastatin.  Meds ordered this encounter  Medications  . doxycycline (VIBRA-TABS) 100 MG tablet    Sig: Take 1 tablet (100 mg total) by mouth 2 (two) times daily.    Dispense:  20 tablet    Refill:  0    Problem List Items Addressed This Visit    None    Visit Diagnoses    Cellulitis of right lower extremity    -  Primary   Relevant  Medications   doxycycline (VIBRA-TABS) 100 MG tablet      Follow-up: No follow-ups on file.  Ann Held, DO

## 2018-06-23 NOTE — Progress Notes (Addendum)
Subjective:   [name] is a 82 y.o. male who presents for Medicare Annual/Subsequent preventive examination.  Reports health as pretty good Broke his back in 19-Dec-2023;  States his back hurts; broke his back in Dec 19, 2023  Wife died 2014/09/29 Still not gotten over her  Has children 2  Checks by fairly often  [place] children 5;  dtr lost her spouse in January   Has not been fly fishing or bass fishing  Does not know why   CABG March 18, 2013; wife died in 2014/03/18 since wife died    Diet BMI 21  Chol/hdl 2.6 Breakfast; bowl of cereal [place] toast, eggs on occasion Lunch; sandwich  Supper- eats vegetables    Exercise Was told he can walk now Treadmill 15 minutes now x 2 per day Does back exercises   There are no preventive care reminders to display for this patient.  Colonoscopy 04/2013;  Had shingrix shot x 2       Objective:    Vitals: BP 110/60   Pulse 60   Ht 5\' 10"  (1.778 m)   Wt 148 lb (67.1 kg)   SpO2 93%   BMI 21.24 kg/m   Body mass index is 21.24 kg/m.  Advanced Directives 05/30/2014 01/24/2014 08/27/2013  Does Patient Have a Medical Advance Directive? Patient has advance directive, copy not in chart Patient has advance directive, copy in chart Patient has advance directive, copy not in chart  Type of Advance Directive Living will Living will Living will  Does patient want to make changes to medical advance directive? No change requested - -  Copy of [place] in Chart? (No Data) - Copy requested from family  Pre-existing out of facility DNR order (yellow form or pink MOST form) - - No    Tobacco Social History   Tobacco Use  Smoking Status Former Smoker  . Packs/day: 2.00  . Years: 10.00  . Pack years: 20.00  . Types: Cigarettes  . Last attempt to quit: 12/03/1994  . Years since quitting: 23.5  Smokeless Tobacco Never Used     Counseling given: Yes   Clinical Intake:   Past Medical History:  Diagnosis Date  . At risk for sleep  apnea    STOP-BANG= 6    SENT TO PCP 05-25-2014  . Benign positional vertigo   . Bladder cancer ([place])   . Coronary artery disease CARDIOLOGIST--  DR Acie Fredrickson  . ED (erectile dysfunction)   . GERD (gastroesophageal reflux disease)   . H/O hiatal hernia   . History of anal fissures   . History of colon polyps   . History of radiation therapy 08/24/12-10/22/12   prostate 6600cGy/33 sessions--  for biochemical recurrence  . Hyperlipidemia   . Hypertension   . OA (osteoarthritis)   . Recurrent prostate adenocarcinoma ([place]) first dx 09/2005--  radial prostatectomy-- gleason 4+3=7, pT2c, negative margin   04/2012--  BIOCHEMICAL  RECURRENCE to 0.33 /  SAL[facility] IMRT SEPT to  NOV 2013  . S/P CABG x 2    09/  March 18, 2013  . S/P dilatation of esophageal stricture    1992  . Sigmoid diverticulosis   . SUI (stress urinary incontinence), male   . Urethral stricture   . Wears glasses    Past Surgical History:  Procedure Laterality Date  . CARDIAC CATHETERIZATION  08-27-2013  DR Martinique   CRITICAL OSTIAL LM STENOSIS/  diffuse disease LAD 40-50%  &   pLCx 30-40%/  . CARDIOVASCULAR STRESS  TEST  08-18-2013  DR NASHER   SMALL/ MEDIUM AREA MILD SCAR INFEROSEPTAL WALL/ NO ISCHEMIA/  LOW RISK SCAN/  EF 62%/  LV WALL MOTION WITH SLIGHT DECREASED OF THE SEPTUM  . CORONARY ANGIOPLASTY  1991   PTCA  of LEFT CFX  . CORONARY ARTERY BYPASS GRAFT N/A 08/27/2013   Procedure: CORONARY ARTERY BYPASS GRAFTING (CABG) times two using left internal mammary and right saphenous vein using endoscope.;  Surgeon: Melrose Nakayama, MD;  Location: [facility];  Service: Open Heart Surgery;  Laterality: N/A;  . CYSTOSCOPY WITH URETHRAL DILATATION N/A 01/26/2014   Procedure: CYSTOSCOPY WITH URETHRAL DILATATION, BLADDER BIOPSY, FOLEY CATHETER;  Surgeon: Molli Hazard, MD;  Location: [facility];  Service: Urology;  Laterality: N/A;  . INGUINAL HERNIA REPAIR Bilateral 1994  . KNEE ARTHROSCOPY Right   . LEFT HEART CATHETERIZATION WITH  CORONARY ANGIOGRAM N/A 08/27/2013   Procedure: LEFT HEART CATHETERIZATION WITH CORONARY ANGIOGRAM;  Surgeon: Peter M Martinique, MD;  Location: [facility] CATH LAB;  Service: Cardiovascular;  Laterality: N/A;  . LUMBAR DISC SURGERY  1980'S  . RADICAL RETROPUBIC PROSTATECTOMY W/ BILATERAL PELVIC NODE DISSECTION  11-18-2005  . URETHROTOMY N/A 05/30/2014   Procedure: CYSTOSCOPY  BALLOON DILATION AND DIRECT VISION INTERNAL URETHROTOMY;  Surgeon: Sharyn Creamer, MD;  Location: [facility];  Service: Urology;  Laterality: N/A;   Family History  Problem Relation Age of Onset  . Cancer Father        prostate died 40 something  . Cancer Brother        prostate cancer  . Arthritis Other   . Hyperlipidemia Other   . Hypertension Other    Social History   Socioeconomic History  . Marital status: Married    Spouse name: Not on file  . Number of children: 2  . Years of education: 18  . Highest education level: Not on file  Occupational History  . Occupation: Retired    Fish farm manager: RETIRED  Social Needs  . Financial resource strain: Not on file  . Food insecurity:    Worry: Not on file    Inability: Not on file  . Transportation needs:    Medical: Not on file    Non-medical: Not on file  Tobacco Use  . Smoking status: Former Smoker    Packs/day: 2.00    Years: 10.00    Pack years: 20.00    Types: Cigarettes    Last attempt to quit: 12/03/1994    Years since quitting: 23.5  . Smokeless tobacco: Never Used  Substance and Sexual Activity  . Alcohol use: Yes    Comment: occasionally  . Drug use: No  . Sexual activity: Not on file  Lifestyle  . Physical activity:    Days per week: Not on file    Minutes per session: Not on file  . Stress: Not on file  Relationships  . Social connections:    Talks on phone: Not on file    Gets together: Not on file    Attends religious service: Not on file    Active member of club or organization: Not on file    Attends meetings of clubs or  organizations: Not on file    Relationship status: Not on file  Other Topics Concern  . Not on file  Social History Narrative   Lives alone   Caffeine use: Coffee- 3 cups daily   Soda sometimes.     Outpatient Encounter Medications as of 06/24/2018  Medication Sig  .  amLODipine (NORVASC) 5 MG tablet Take 1 tablet (5 mg total) by mouth daily.  [name] aspirin 81 MG EC tablet Take 1 tablet (81 mg total) by mouth daily.  [name] atorvastatin (LIPITOR) 40 MG tablet TAKE 1 TABLET(40 MG) BY MOUTH AT BEDTIME  . carvedilol (COREG) 12.5 MG tablet Take 1 tablet (12.5 mg total) by mouth 2 (two) times daily.  [name] latanoprost (XALATAN) 0.005 % ophthalmic solution Place 1 drop into both eyes daily.   . Multiple Vitamin (MULTIVITAMIN WITH MINERALS) TABS tablet Take 1 tablet by mouth daily.  . nitroGLYCERIN (NITROSTAT) 0.4 MG SL tablet PLACE 1 TABLET UNDER THE TONUE EVERY 5 MINUTES AS NEEDED FOR CHEST PAIN.  [name] pantoprazole (PROTONIX) 40 MG tablet TAKE 1 TABLET BY MOUTH EVERY OTHER DAY IN THE MORNING.  [name] polyethylene glycol (MIRALAX / GLYCOLAX) packet Take 17 g by mouth daily as needed (for constipation).   . sertraline (ZOLOFT) 50 MG tablet TAKE 1 TABLET BY MOUTH DAILY  . vitamin B-12 (CYANOCOBALAMIN) 100 MCG tablet Take 100 mcg by mouth daily.  . [DISCONTINUED] doxycycline (VIBRA-TABS) 100 MG tablet Take 1 tablet (100 mg total) by mouth 2 (two) times daily.   No facility-administered encounter medications on file as of 06/24/2018.     Activities of Daily Living No flowsheet data found.  Patient Care Team: [name], MD as PCP - General Nahser, Wonda Cheng, MD as PCP - Cardiology (Cardiology) Sharyne Peach, MD as Consulting Physician (Ophthalmology)   Assessment:   This is a routine wellness examination for [name].  Exercise Activities and Dietary recommendations    Goals    None      Fall Risk Fall Risk  11/12/2017 11/08/2016 09/07/2015 08/10/2015 08/04/2014  Falls in the past year? Yes No No No No    Comment - Emmi Telephone Survey: data to providers prior to load - - -  Number falls in past yr: 1 - - - -  Injury with Fall? Yes - - - -     Depression Screen PHQ 2/9 Scores 06/24/2018 07/18/2017 09/07/2015 08/04/2014  PHQ - 2 Score 3 1 2  0  PHQ- 9 Score 5 - 3 -   Wife was his only company; she did have many friends and they were each other's company Has a large home  Discussed seeing a counselor to assist him with "making the next move". Admits to being lonely. Agrees to consider options or to make an apt with Dr. Elease Hashimoto to change his medicine which is not helping him as much as it was. Denies SI;    Cognitive Function     Ad8 score reviewed for issues:  Issues making decisions:  Less interest in hobbies / activities:  Repeats questions, stories (family complaining):  Trouble using ordinary gadgets (microwave, computer, phone):  Forgets the month or year:   Mismanaging finances:   Remembering appts:  Daily problems with thinking and/or memory: Ad8 score is= 0        Immunization History  Administered Date(s) Administered  . Influenza Split 08/25/2012  . Influenza Whole 08/16/2010  . Influenza, High Dose Seasonal PF 08/12/2016, 08/20/2017  . Influenza,inj,Quad PF,6+ Mos 08/24/2013, 08/04/2014, 08/10/2015  . Pneumococcal Conjugate-13 08/04/2014  . Pneumococcal Polysaccharide-23 12/05/2004  . Td 11/08/2010  . Zoster Recombinat (Shingrix) 10/25/2017, 01/29/2018      Screening Tests Health Maintenance  Topic Date Due  . INFLUENZA VACCINE  07/02/2018  . TETANUS/TDAP  11/08/2020  . PNA vac Low Risk Adult  Completed  Plan:      PCP Notes   Health Maintenance Colonoscopy 04/2013;  Had shingrix shot x 2  Abnormal Screens  Depression Phq 5 Wife was his only company; she did have many friends and they were each other's company Has a large home  Discussed seeing a counselor to assist him with "making the next move". Admits to being  lonely. Agrees to consider options or to make an apt with Dr. Elease Hashimoto to change his medicine which is not helping him as much as it was. Denies SI;    Referrals  Discussed behavioral health;   Patient concerns; As noted   Nurse Concerns; As noted  Next PCP apt TBS        I have personally reviewed and noted the following in the patient's chart:   . Medical and social history . Use of alcohol, tobacco or illicit drugs  . Current medications and supplements . Functional ability and status . Nutritional status . Physical activity . Advanced directives . List of other physicians . Hospitalizations, surgeries, and ER visits in previous 12 months . Vitals . Screenings to include cognitive, depression, and falls . Referrals and appointments  In addition, I have reviewed and discussed with patient certain preventive protocols, quality metrics, and best practice recommendations. A written personalized care plan for preventive services as well as general preventive health recommendations were provided to patient.     GOTLX,BWIOM, RN  06/24/2018  I have reviewed the documentation for the AWV and [place] provided by the health coach and agree with their documentation. I was immediately available for any questions  [name] MD [facility]

## 2018-06-24 ENCOUNTER — Ambulatory Visit (INDEPENDENT_AMBULATORY_CARE_PROVIDER_SITE_OTHER): Payer: Medicare Other

## 2018-06-24 VITALS — BP 110/60 | HR 60 | Ht 70.0 in | Wt 148.0 lb

## 2018-06-24 DIAGNOSIS — Z0000 Encounter for general adult medical examination without abnormal findings: Secondary | ICD-10-CM

## 2018-06-24 NOTE — Patient Instructions (Addendum)
[name] , Thank you for taking time to come for your Medicare Wellness Visit. I appreciate your ongoing commitment to your health goals. Please review the following plan we discussed and let me know if I can assist you in the future.   Have the doctor check any skin areas of concern  These are the goals we discussed: Goals    . Patient Stated     Do something!       This is a list of the screening recommended for you and due dates:  Health Maintenance  Topic Date Due  . Flu Shot  07/02/2018  . Tetanus Vaccine  11/08/2020  . Pneumonia vaccines  Completed      Fall Prevention in the Home Falls can cause injuries. They can happen to people of all ages. There are many things you can do to make your home safe and to help prevent falls. What can I do on the outside of my home?  Regularly fix the edges of walkways and driveways and fix any cracks.  Remove anything that might make you trip as you walk through a door, such as a raised step or threshold.  Trim any bushes or trees on the path to your home.  Use bright outdoor lighting.  Clear any walking paths of anything that might make someone trip, such as rocks or tools.  Regularly check to see if handrails are loose or broken. Make sure that both sides of any steps have handrails.  Any raised decks and porches should have guardrails on the edges.  Have any leaves, snow, or ice cleared regularly.  Use sand or salt on walking paths during winter.  Clean up any spills in your garage right away. This includes oil or grease spills. What can I do in the bathroom?  Use night lights.  Install grab bars by the toilet and in the tub and shower. Do not use towel bars as grab bars.  Use non-skid mats or decals in the tub or shower.  If you need to sit down in the shower, use a plastic, non-slip stool.  Keep the floor dry. Clean up any water that spills on the floor as soon as it happens.  Remove soap buildup in the tub  or shower regularly.  Attach bath mats securely with double-sided non-slip rug tape.  Do not have throw rugs and other things on the floor that can make you trip. What can I do in the bedroom?  Use night lights.  Make sure that you have a light by your bed that is easy to reach.  Do not use any sheets or blankets that are too big for your bed. They should not hang down onto the floor.  Have a firm chair that has side arms. You can use this for support while you get dressed.  Do not have throw rugs and other things on the floor that can make you trip. What can I do in the kitchen?  Clean up any spills right away.  Avoid walking on wet floors.  Keep items that you use a lot in easy-to-reach places.  If you need to reach something above you, use a strong step stool that has a grab bar.  Keep electrical cords out of the way.  Do not use floor polish or wax that makes floors slippery. If you must use wax, use non-skid floor wax.  Do not have throw rugs and other things on the floor that can make you trip.  What can I do with my stairs?  Do not leave any items on the stairs.  Make sure that there are handrails on both sides of the stairs and use them. Fix handrails that are broken or loose. Make sure that handrails are as long as the stairways.  Check any carpeting to make sure that it is firmly attached to the stairs. Fix any carpet that is loose or worn.  Avoid having throw rugs at the top or bottom of the stairs. If you do have throw rugs, attach them to the floor with carpet tape.  Make sure that you have a light switch at the top of the stairs and the bottom of the stairs. If you do not have them, ask someone to add them for you. What else can I do to help prevent falls?  Wear shoes that: ? Do not have high heels. ? Have rubber bottoms. ? Are comfortable and fit you well. ? Are closed at the toe. Do not wear sandals.  If you use a stepladder: ? Make sure that it is  fully opened. Do not climb a closed stepladder. ? Make sure that both sides of the stepladder are locked into place. ? Ask someone to hold it for you, if possible.  Clearly mark and make sure that you can see: ? Any grab bars or handrails. ? First and last steps. ? Where the edge of each step is.  Use tools that help you move around (mobility aids) if they are needed. These include: ? Canes. ? Walkers. ? Scooters. ? Crutches.  Turn on the lights when you go into a dark area. Replace any light bulbs as soon as they burn out.  Set up your furniture so you have a clear path. Avoid moving your furniture around.  If any of your floors are uneven, fix them.  If there are any pets around you, be aware of where they are.  Review your medicines with your doctor. Some medicines can make you feel dizzy. This can increase your chance of falling. Ask your doctor what other things that you can do to help prevent falls. This information is not intended to replace advice given to you by your health care provider. Make sure you discuss any questions you have with your health care provider. Document Released: 09/14/2009 Document Revised: 04/25/2016 Document Reviewed: 12/23/2014 Elsevier Interactive Patient Education  2018 [place] Maintenance, Male A healthy lifestyle and preventive care is important for your health and wellness. Ask your health care provider about what schedule of regular examinations is right for you. What should I know about weight and diet? Eat a Healthy Diet  Eat plenty of vegetables, fruits, whole grains, low-fat dairy products, and lean protein.  Do not eat a lot of foods high in solid fats, added sugars, or salt.  Maintain a Healthy Weight Regular exercise can help you achieve or maintain a healthy weight. You should:  Do at least 150 minutes of exercise each week. The exercise should increase your heart rate and make you sweat (moderate-intensity  exercise).  Do strength-training exercises at least twice a week.  Watch Your Levels of Cholesterol and Blood Lipids  Have your blood tested for lipids and cholesterol every 5 years starting at 80 years of age. If you are at high risk for heart disease, you should start having your blood tested when you are 80 years old. You may need to have your cholesterol levels checked more often if: ?  Your lipid or cholesterol levels are high. ? You are older than 80 years of age. ? You are at high risk for heart disease.  What should I know about cancer screening? Many types of cancers can be detected early and may often be prevented. Lung Cancer  You should be screened every year for lung cancer if: ? You are a current smoker who has smoked for at least 30 years. ? You are a former smoker who has quit within the past 15 years.  Talk to your health care provider about your screening options, when you should start screening, and how often you should be screened.  Colorectal Cancer  Routine colorectal cancer screening usually begins at 80 years of age and should be repeated every 5-10 years until you are 80 years old. You may need to be screened more often if early forms of precancerous polyps or small growths are found. Your health care provider may recommend screening at an earlier age if you have risk factors for colon cancer.  Your health care provider may recommend using home test kits to check for hidden blood in the stool.  A small camera at the end of a tube can be used to examine your colon (sigmoidoscopy or colonoscopy). This checks for the earliest forms of colorectal cancer.  Prostate and Testicular Cancer  Depending on your age and overall health, your health care provider may do certain tests to screen for prostate and testicular cancer.  Talk to your health care provider about any symptoms or concerns you have about testicular or prostate cancer.  Skin Cancer  Check your skin  from head to toe regularly.  Tell your health care provider about any new moles or changes in moles, especially if: ? There is a change in a mole's size, shape, or color. ? You have a mole that is larger than a pencil eraser.  Always use sunscreen. Apply sunscreen liberally and repeat throughout the day.  Protect yourself by wearing long sleeves, pants, a wide-brimmed hat, and sunglasses when outside.  What should I know about heart disease, diabetes, and high blood pressure?  If you are 6-8 years of age, have your blood pressure checked every 3-5 years. If you are 96 years of age or older, have your blood pressure checked every year. You should have your blood pressure measured twice-once when you are at a hospital or clinic, and once when you are not at a hospital or clinic. Record the average of the two measurements. To check your blood pressure when you are not at a hospital or clinic, you can use: ? An automated blood pressure machine at a pharmacy. ? A home blood pressure monitor.  Talk to your health care provider about your target blood pressure.  If you are between 15-56 years old, ask your health care provider if you should take aspirin to prevent heart disease.  Have regular diabetes screenings by checking your fasting blood sugar level. ? If you are at a normal weight and have a low risk for diabetes, have this test once every three years after the age of 31. ? If you are overweight and have a high risk for diabetes, consider being tested at a younger age or more often.  A one-time screening for abdominal aortic aneurysm (AAA) by ultrasound is recommended for men aged 61-75 years who are current or former smokers. What should I know about preventing infection? Hepatitis B If you have a higher risk for hepatitis B, you  should be screened for this virus. Talk with your health care provider to find out if you are at risk for hepatitis B infection. Hepatitis C Blood testing is  recommended for:  Everyone born from [birth year] through 1963.  Anyone with known risk factors for hepatitis C.  Sexually Transmitted Diseases (STDs)  You should be screened each year for STDs including gonorrhea and chlamydia if: ? You are sexually active and are younger than 80 years of age. ? You are older than 80 years of age and your health care provider tells you that you are at risk for this type of infection. ? Your sexual activity has changed since you were last screened and you are at an increased risk for chlamydia or gonorrhea. Ask your health care provider if you are at risk.  Talk with your health care provider about whether you are at high risk of being infected with HIV. Your health care provider may recommend a prescription medicine to help prevent HIV infection.  What else can I do?  Schedule regular health, dental, and eye exams.  Stay current with your vaccines (immunizations).  Do not use any tobacco products, such as cigarettes, chewing tobacco, and e-cigarettes. If you need help quitting, ask your health care provider.  Limit alcohol intake to no more than 2 drinks per day. One drink equals 12 ounces of beer, 5 ounces of wine, or 1 ounces of hard liquor.  Do not use street drugs.  Do not share needles.  Ask your health care provider for help if you need support or information about quitting drugs.  Tell your health care provider if you often feel depressed.  Tell your health care provider if you have ever been abused or do not feel safe at home. This information is not intended to replace advice given to you by your health care provider. Make sure you discuss any questions you have with your health care provider. Document Released: 05/16/2008 Document Revised: 07/17/2016 Document Reviewed: 08/22/2015 Elsevier Interactive Patient Education  2018 [employer].   Hearing Loss Hearing loss is a partial or total loss of the ability to hear. This can be temporary  or permanent, and it can happen in one or both ears. Hearing loss may be referred to as deafness. Medical care is necessary to treat hearing loss properly and to prevent the condition from getting worse. Your hearing may partially or completely come back, depending on what caused your hearing loss and how severe it is. In some cases, hearing loss is permanent. What are the causes? Common causes of hearing loss include:  Too much wax in the ear canal.  Infection of the ear canal or middle ear.  Fluid in the middle ear.  Injury to the ear or surrounding area.  An object stuck in the ear.  Prolonged exposure to loud sounds, such as music.  Less common causes of hearing loss include:  Tumors in the ear.  Viral or bacterial infections, such as meningitis.  A hole in the eardrum (perforated eardrum).  Problems with the hearing nerve that sends signals between the brain and the ear.  Certain medicines.  What are the signs or symptoms? Symptoms of this condition may include:  Difficulty telling the difference between sounds.  Difficulty following a conversation when there is background noise.  Lack of response to sounds in your environment. This may be most noticeable when you do not respond to startling sounds.  Needing to turn up the volume on the television,  radio, etc.  Ringing in the ears.  Dizziness.  Pain in the ears.  How is this diagnosed? This condition is diagnosed based on a physical exam and a hearing test (audiometry). The audiometry test will be performed by a hearing specialist (audiologist). You may also be referred to an ear, nose, and throat (ENT) specialist (otolaryngologist). How is this treated? Treatment for recent onset of hearing loss may include:  Ear wax removal.  Being prescribed medicines to prevent infection (antibiotics).  Being prescribed medicines to reduce inflammation (corticosteroids).  Follow these instructions at home:  If you  were prescribed an antibiotic medicine, take it as told by your health care provider. Do not stop taking the antibiotic even if you start to feel better.  Take over-the-counter and prescription medicines only as told by your health care provider.  Avoid loud noises.  Return to your normal activities as told by your health care provider. Ask your health care provider what activities are safe for you.  Keep all follow-up visits as told by your health care provider. This is important. Contact a health care provider if:  You feel dizzy.  You develop new symptoms.  You vomit or feel nauseous.  You have a fever. Get help right away if:  You develop sudden changes in your vision.  You have severe ear pain.  You have new or increased weakness.  You have a severe headache. This information is not intended to replace advice given to you by your health care provider. Make sure you discuss any questions you have with your health care provider. Document Released: 11/18/2005 Document Revised: 04/25/2016 Document Reviewed: 04/05/2015 Elsevier Interactive Patient Education  2018 [employer].

## 2018-07-05 ENCOUNTER — Other Ambulatory Visit: Payer: Self-pay | Admitting: Family Medicine

## 2018-07-20 ENCOUNTER — Other Ambulatory Visit: Payer: Self-pay | Admitting: Family Medicine

## 2018-07-30 ENCOUNTER — Encounter: Payer: Self-pay | Admitting: Adult Health

## 2018-07-30 ENCOUNTER — Ambulatory Visit (INDEPENDENT_AMBULATORY_CARE_PROVIDER_SITE_OTHER): Payer: Medicare Other | Admitting: Adult Health

## 2018-07-30 VITALS — BP 126/70 | Temp 98.1°F | Wt 147.0 lb

## 2018-07-30 DIAGNOSIS — R600 Localized edema: Secondary | ICD-10-CM

## 2018-07-30 DIAGNOSIS — I714 Abdominal aortic aneurysm, without rupture, unspecified: Secondary | ICD-10-CM

## 2018-07-30 DIAGNOSIS — I2510 Atherosclerotic heart disease of native coronary artery without angina pectoris: Secondary | ICD-10-CM | POA: Diagnosis not present

## 2018-07-30 MED ORDER — PANTOPRAZOLE SODIUM 40 MG PO TBEC: DELAYED_RELEASE_TABLET | ORAL | 0 refills | Status: DC

## 2018-07-30 NOTE — Progress Notes (Signed)
Subjective:    Patient ID: [name], male    DOB: [birth year], 82 y.o.   MRN: [id number]  HPI 82 year old male who  has a past medical history of At risk for sleep apnea, Benign positional vertigo, Bladder cancer ([place]), Coronary artery disease (CARDIOLOGIST--  DR Acie Fredrickson), ED (erectile dysfunction), GERD (gastroesophageal reflux disease), H/O hiatal hernia, History of anal fissures, History of colon polyps, History of radiation therapy (08/24/12-10/22/12), Hyperlipidemia, Hypertension, OA (osteoarthritis), Recurrent prostate adenocarcinoma ([place]) (first dx 09/2005--  radial prostatectomy-- gleason 4+3=7, pT2c, negative margin), S/P CABG x 2, S/P dilatation of esophageal stricture, Sigmoid diverticulosis, SUI (stress urinary incontinence), male, Urethral stricture, and Wears glasses.  He is a patient of Dr. Elease Hashimoto, who I am seeing today for an acute complaint of unilateral ( left) lower extremity swelling x 2 months. He denies any shortness or breath, CP, calf pain, decreased ROM, redness, warmth, or trauma. New medications include that of Norvasc 5 mg ( prescribed 04/2018) and Coreg 12.5 mg ( 03/2018).    Review of Systems See HPI   Past Medical History:  Diagnosis Date  . At risk for sleep apnea    STOP-BANG= 6    SENT TO PCP 05-25-2014  . Benign positional vertigo   . Bladder cancer ([place])   . Coronary artery disease CARDIOLOGIST--  DR Acie Fredrickson  . ED (erectile dysfunction)   . GERD (gastroesophageal reflux disease)   . H/O hiatal hernia   . History of anal fissures   . History of colon polyps   . History of radiation therapy 08/24/12-10/22/12   prostate 6600cGy/33 sessions--  for biochemical recurrence  . Hyperlipidemia   . Hypertension   . OA (osteoarthritis)   . Recurrent prostate adenocarcinoma ([place]) first dx 09/2005--  radial prostatectomy-- gleason 4+3=7, pT2c, negative margin   04/2012--  BIOCHEMICAL  RECURRENCE to 0.33 /  SALVAGE IMRT SEPT to  NOV 2013  . S/P CABG x 2    09/  2014  . S/P dilatation of esophageal stricture    1992  . Sigmoid diverticulosis   . SUI (stress urinary incontinence), male   . Urethral stricture   . Wears glasses     Social History   Socioeconomic History  . Marital status: Married    Spouse name: Not on file  . Number of children: 2  . Years of education: 5  . Highest education level: Not on file  Occupational History  . Occupation: Retired    Fish farm manager: RETIRED  Social Needs  . Financial resource strain: Not on file  . Food insecurity:    Worry: Not on file    Inability: Not on file  . Transportation needs:    Medical: Not on file    Non-medical: Not on file  Tobacco Use  . Smoking status: Former Smoker    Packs/day: 2.00    Years: 10.00    Pack years: 20.00    Types: Cigarettes    Last attempt to quit: 12/03/1994    Years since quitting: 23.6  . Smokeless tobacco: Never Used  Substance and Sexual Activity  . Alcohol use: Yes    Comment: occasionally  . Drug use: No  . Sexual activity: Not on file  Lifestyle  . Physical activity:    Days per week: Not on file    Minutes per session: Not on file  . Stress: Not on file  Relationships  . Social connections:    Talks on phone: Not on file  Gets together: Not on file    Attends religious service: Not on file    Active member of club or organization: Not on file    Attends meetings of clubs or organizations: Not on file    Relationship status: Not on file  . Intimate partner violence:    Fear of current or ex partner: Not on file    Emotionally abused: Not on file    Physically abused: Not on file    Forced sexual activity: Not on file  Other Topics Concern  . Not on file  Social History Narrative   Lives alone   Caffeine use: Coffee- 3 cups daily   Soda sometimes.     Past Surgical History:  Procedure Laterality Date  . CARDIAC CATHETERIZATION  08-27-2013  DR Martinique   CRITICAL OSTIAL LM STENOSIS/  diffuse disease LAD 40-50%  &   pLCx  30-40%/  . CARDIOVASCULAR STRESS TEST  08-18-2013  DR NASHER   SMALL/ MEDIUM AREA MILD SCAR INFEROSEPTAL WALL/ NO ISCHEMIA/  LOW RISK SCAN/  EF 62%/  LV WALL MOTION WITH SLIGHT DECREASED OF THE SEPTUM  . CORONARY ANGIOPLASTY  1991   PTCA  of LEFT CFX  . CORONARY ARTERY BYPASS GRAFT N/A 08/27/2013   Procedure: CORONARY ARTERY BYPASS GRAFTING (CABG) times two using left internal mammary and right saphenous vein using endoscope.;  Surgeon: Melrose Nakayama, MD;  Location: [facility];  Service: Open Heart Surgery;  Laterality: N/A;  . CYSTOSCOPY WITH URETHRAL DILATATION N/A 01/26/2014   Procedure: CYSTOSCOPY WITH URETHRAL DILATATION, BLADDER BIOPSY, FOLEY CATHETER;  Surgeon: Molli Hazard, MD;  Location: [facility];  Service: Urology;  Laterality: N/A;  . INGUINAL HERNIA REPAIR Bilateral 1994  . KNEE ARTHROSCOPY Right   . LEFT HEART CATHETERIZATION WITH CORONARY ANGIOGRAM N/A 08/27/2013   Procedure: LEFT HEART CATHETERIZATION WITH CORONARY ANGIOGRAM;  Surgeon: Peter M Martinique, MD;  [facility] C[facility];  Service: Cardiovascular;  Laterality: N/A;  . LUMBAR DISC SURGERY  1980'S  . RADICAL RETROPUBIC PROSTATECTOMY W/ BILATERAL PELVIC NODE DISSECTION  11-18-2005  . URETHROTOMY N/A 05/30/2014   Procedure: CYSTOSCOPY  BALLOON DILATION AND DIRECT VISION INTERNAL URETHROTOMY;  Surgeon: Sharyn Creamer, MD;  [facility];  Service: Urology;  Laterality: N/A;    Family History  Problem Relation Age of Onset  . Cancer Father        prostate died 90 something  . Cancer Brother        prostate cancer  . Arthritis Other   . Hyperlipidemia Other   . Hypertension Other     Allergies  Allergen Reactions  . Bee Venom Other (See Comments)    Reaction unknown  . Amoxicillin Rash  . Hydrocodone Other (See Comments)    REACTION: disorientation    Current Outpatient Medications on File Prior to Visit  Medication Sig Dispense Refill  . aspirin 81 MG EC tablet Take 1 tablet (81  mg total) by mouth daily.    [name] atorvastatin (LIPITOR) 40 MG tablet TAKE 1 TABLET(40 MG) BY MOUTH AT BEDTIME 90 tablet 0  . carvedilol (COREG) 12.5 MG tablet Take 1 tablet (12.5 mg total) by mouth 2 (two) times daily. 180 tablet 3  . latanoprost (XALATAN) 0.005 % ophthalmic solution Place 1 drop into both eyes daily.     . Multiple Vitamin (MULTIVITAMIN WITH MINERALS) TABS tablet Take 1 tablet by mouth daily.    . nitroGLYCERIN (NITROSTAT) 0.4 MG SL tablet PLACE 1 TABLET UNDER  THE TONUE EVERY 5 MINUTES AS NEEDED FOR CHEST PAIN. 75 tablet 3  . pantoprazole (PROTONIX) 40 MG tablet TAKE 1 TABLET BY MOUTH EVERY OTHER MORNING 90 tablet 0  . polyethylene glycol (MIRALAX / GLYCOLAX) packet Take 17 g by mouth daily as needed (for constipation).     . sertraline (ZOLOFT) 50 MG tablet TAKE 1 TABLET BY MOUTH DAILY 90 tablet 0  . vitamin B-12 (CYANOCOBALAMIN) 100 MCG tablet Take 100 mcg by mouth daily.    [name] amLODipine (NORVASC) 5 MG tablet Take 1 tablet (5 mg total) by mouth daily. 30 tablet 11   No current facility-administered medications on file prior to visit.     BP 126/70   Temp 98.1 F (36.7 C) (Oral)   Wt 147 lb (66.7 kg)   BMI 21.09 kg/m       Objective:   Physical Exam  Constitutional: He is oriented to person, place, and time. He appears well-developed and well-nourished. No distress.  Cardiovascular: Normal rate, regular rhythm, normal heart sounds and intact distal pulses.  Pulmonary/Chest: Effort normal and breath sounds normal.  Musculoskeletal: Normal range of motion. He exhibits edema. He exhibits no tenderness or deformity.  + 1 pitting edema noted from ankle to above knee No pain with palpation throughout leg  No calf pain, warmth, or redness.   Neurological: He is alert and oriented to person, place, and time.  Skin: Skin is warm and dry. Capillary refill takes less than 2 seconds. No rash noted. He is not diaphoretic. No erythema. No pallor.  Psychiatric: He has a normal  mood and affect. His behavior is normal. Judgment and thought content normal.  Nursing note and vitals reviewed.     Assessment & Plan:  1. Lower extremity edema - Will get [place] lower extremity to r/o DVT  - VAS [place] LOWER EXTREMITY VENOUS (DVT); Future  2. Abdominal aortic aneurysm (AAA) without rupture ([place]) - He is also due for follow up AAA [place] - will order this for PCP  - VAS [place] AAA DUPLEX; Future   Dorothyann Peng, NP

## 2018-07-31 ENCOUNTER — Ambulatory Visit (HOSPITAL_COMMUNITY)
Admission: RE | Admit: 2018-07-31 | Discharge: 2018-07-31 | Disposition: A | Discharge status: Home / Self Care | Payer: Medicare Other | Source: Ambulatory Visit | Attending: Adult Health | Admitting: Adult Health

## 2018-07-31 ENCOUNTER — Other Ambulatory Visit (HOSPITAL_COMMUNITY): Payer: Medicare Other

## 2018-07-31 ENCOUNTER — Telehealth: Payer: Self-pay | Admitting: Family Medicine

## 2018-07-31 DIAGNOSIS — R600 Localized edema: Secondary | ICD-10-CM | POA: Diagnosis not present

## 2018-07-31 DIAGNOSIS — C61 Malignant neoplasm of prostate: Secondary | ICD-10-CM | POA: Insufficient documentation

## 2018-07-31 NOTE — Progress Notes (Signed)
Left a message for a return call.

## 2018-07-31 NOTE — Telephone Encounter (Addendum)
-----   Message from Dorothyann Peng, NP sent at 07/31/2018 12:25 PM EDT ----- Negative for DVT - have him elevate legs this weekend to see if this helps with the swelling. He may want to follow up with his cardiologist    ----- Message ----- From: Lorina Rabon Sent: 07/31/2018  12:14 PM EDT To: Dorothyann Peng, NP

## 2018-07-31 NOTE — Telephone Encounter (Signed)
Tried to reach the pt by telephone.  Left a message for a return call. 

## 2018-07-31 NOTE — Progress Notes (Signed)
Preliminary notes--Left lower extremity venous duplex exam completed. Negative for DVT. Result attempted to call ordering physician office multiple times no answer.  Patient left for home.  Will e-fax result later.  Hongying Cole (RDMS RVT)  07/31/18 10:06 AM

## 2018-07-31 NOTE — Progress Notes (Signed)
Negative for DVT - have him elevate legs this weekend to see if this helps with the swelling. He may want to follow up with his cardiologist

## 2018-08-04 ENCOUNTER — Telehealth: Payer: Self-pay | Admitting: Family Medicine

## 2018-08-04 NOTE — Telephone Encounter (Signed)
Left a message for a return call.

## 2018-08-04 NOTE — Telephone Encounter (Signed)
Copied from [place] [id number]. Topic: Quick Communication - See Telephone Encounter >> Aug 04, 2018 11:23 AM Mylinda Latina, NT wrote: CRM for notification. See Telephone encounter for: 08/04/18. Patient called and is inquiring about his doppler results. Please call CB# [phone]

## 2018-08-04 NOTE — Telephone Encounter (Signed)
Please call patient back

## 2018-08-04 NOTE — Telephone Encounter (Signed)
Noted  

## 2018-08-04 NOTE — Telephone Encounter (Signed)
See note

## 2018-08-05 ENCOUNTER — Telehealth: Payer: Self-pay | Admitting: Family Medicine

## 2018-08-05 ENCOUNTER — Ambulatory Visit (HOSPITAL_COMMUNITY)
Admission: RE | Admit: 2018-08-05 | Discharge: 2018-08-05 | Disposition: A | Discharge status: Home / Self Care | Payer: Medicare Other | Source: Ambulatory Visit | Attending: Cardiovascular Disease | Admitting: Cardiovascular Disease

## 2018-08-05 DIAGNOSIS — I714 Abdominal aortic aneurysm, without rupture, unspecified: Secondary | ICD-10-CM

## 2018-08-05 NOTE — Telephone Encounter (Signed)
Prior auth for Pantoprazole 40mg  sent to [email or web address]-key [id number].  Approval given today and I called [facility] and left a detailed message on the voicemail with this info.

## 2018-08-05 NOTE — Telephone Encounter (Signed)
Pt notified of results by telephone and voiced understanding.  No further action required.

## 2018-08-05 NOTE — Telephone Encounter (Signed)
Copied from [place] [id number]. Topic: General - Other >> Aug 05, 2018  1:20 PM Carolyn Stare wrote:  KP with [facility] call to say pt insurance will not cover the below med without a PA. She said the last RX was filled on 04/27/18 and the insurance is not covering another refill  within a years time  pantoprazole (PROTONIX) 40 MG tablet  Can call [phone]

## 2018-08-10 ENCOUNTER — Other Ambulatory Visit: Payer: Self-pay | Admitting: Family Medicine

## 2018-08-11 ENCOUNTER — Telehealth: Payer: Self-pay | Admitting: Nurse Practitioner

## 2018-08-11 ENCOUNTER — Telehealth: Payer: Self-pay | Admitting: Family Medicine

## 2018-08-11 DIAGNOSIS — I714 Abdominal aortic aneurysm, without rupture, unspecified: Secondary | ICD-10-CM

## 2018-08-11 DIAGNOSIS — I2510 Atherosclerotic heart disease of native coronary artery without angina pectoris: Secondary | ICD-10-CM

## 2018-08-11 DIAGNOSIS — I10 Essential (primary) hypertension: Secondary | ICD-10-CM

## 2018-08-11 MED ORDER — FUROSEMIDE 40 MG PO TABS: 40.0000 mg | ORAL_TABLET | Freq: Every day | ORAL | 3 refills | Status: DC

## 2018-08-11 MED ORDER — POTASSIUM CHLORIDE ER 10 MEQ PO TBCR: 10.0000 meq | EXTENDED_RELEASE_TABLET | Freq: Every day | ORAL | 3 refills | Status: DC

## 2018-08-11 NOTE — Telephone Encounter (Signed)
Pt notified of results/instructions and verbalized understanding.

## 2018-08-11 NOTE — Telephone Encounter (Addendum)
Called patient in regards to MyChart message that was received. He reports left leg swelling x 1 month "at least" per patient. He had a LE venous doppler on 8/30 which was negative for DVT. He denies injury or trauma, color or temperature change to LLE. He denies SOB. States he is pretty active during the day and does not spend a lot of time sitting but states he will try to elevate legs when he is sitting. I advised that Dr. Acie Fredrickson wanted to start him on Lasix when he was in the office in May but patient denied because of bladder concerns. I asked if he might be willing to try Lasix for a few days. Patient asked if he amlodipine can cause leg swelling and I confirmed. I discussed plan with Dr. Acie Fredrickson who is in the office and he advised patient d/c Amlodipine and start Lasix 40 mg QD and Kdur 10 mEq QD. He advised patient should get a bmet in 2-3 weeks and an echo soon. I scheduled those tests for patient on 10/1 and advised him to call back with questions or concerns prior to appointments. Patient verbalized understanding and agreement with plan and thanked me for the call.

## 2018-08-11 NOTE — Telephone Encounter (Signed)
Copied from [place] [id number]. Topic: Quick Communication - See Telephone Encounter >> Aug 11, 2018 12:16 PM Vernona Rieger wrote: CRM for notification. See Telephone encounter for: 08/11/18.  Patient is checking the status of his results from last Wednesday 9/4

## 2018-09-01 ENCOUNTER — Ambulatory Visit (HOSPITAL_COMMUNITY): Payer: Medicare Other | Attending: Cardiology

## 2018-09-01 ENCOUNTER — Other Ambulatory Visit: Payer: Medicare Other

## 2018-09-01 ENCOUNTER — Other Ambulatory Visit: Payer: Self-pay

## 2018-09-01 DIAGNOSIS — I2510 Atherosclerotic heart disease of native coronary artery without angina pectoris: Secondary | ICD-10-CM

## 2018-09-01 DIAGNOSIS — I083 Combined rheumatic disorders of mitral, aortic and tricuspid valves: Secondary | ICD-10-CM | POA: Diagnosis not present

## 2018-09-01 DIAGNOSIS — I119 Hypertensive heart disease without heart failure: Secondary | ICD-10-CM | POA: Diagnosis not present

## 2018-09-01 DIAGNOSIS — Z951 Presence of aortocoronary bypass graft: Secondary | ICD-10-CM | POA: Insufficient documentation

## 2018-09-01 DIAGNOSIS — E785 Hyperlipidemia, unspecified: Secondary | ICD-10-CM | POA: Insufficient documentation

## 2018-09-01 DIAGNOSIS — I10 Essential (primary) hypertension: Secondary | ICD-10-CM

## 2018-09-01 DIAGNOSIS — I498 Other specified cardiac arrhythmias: Secondary | ICD-10-CM | POA: Insufficient documentation

## 2018-09-01 LAB — BASIC METABOLIC PANEL
BUN/Creatinine Ratio: 14 (ref 10–24)
BUN: 23 mg/dL (ref 8–27)
CO2: 26 mmol/L (ref 20–29)
Calcium: 9 mg/dL (ref 8.6–10.2)
Chloride: 102 mmol/L (ref 96–106)
Creatinine, Ser: 1.62 mg/dL — ABNORMAL HIGH (ref 0.76–1.27)
GFR calc Af Amer: 46 mL/min/{1.73_m2} — ABNORMAL LOW (ref >59)
GFR calc non Af Amer: 39 mL/min/{1.73_m2} — ABNORMAL LOW (ref >59)
Glucose: 94 mg/dL (ref 65–99)
Potassium: 4.7 mmol/L (ref 3.5–5.2)
Sodium: 143 mmol/L (ref 134–144)

## 2018-09-04 ENCOUNTER — Ambulatory Visit (INDEPENDENT_AMBULATORY_CARE_PROVIDER_SITE_OTHER): Payer: Medicare Other | Admitting: Cardiovascular Disease

## 2018-09-04 ENCOUNTER — Encounter: Payer: Self-pay | Admitting: Cardiovascular Disease

## 2018-09-04 ENCOUNTER — Encounter: Payer: Self-pay | Admitting: Nurse Practitioner

## 2018-09-04 VITALS — BP 112/48 | HR 51 | Ht 70.0 in | Wt 145.1 lb

## 2018-09-04 DIAGNOSIS — I2510 Atherosclerotic heart disease of native coronary artery without angina pectoris: Secondary | ICD-10-CM | POA: Diagnosis not present

## 2018-09-04 DIAGNOSIS — I5022 Chronic systolic (congestive) heart failure: Secondary | ICD-10-CM

## 2018-09-04 MED ORDER — FUROSEMIDE 20 MG PO TABS: 20.0000 mg | ORAL_TABLET | Freq: Every day | ORAL | 3 refills | Status: DC

## 2018-09-04 NOTE — Progress Notes (Signed)
[name] Date of Birth  23-Aug-1936         [address]    [place]    [place], [place]  [address]          Problem list: 1. Coronary artery disease- status post remote PTCA to the left complex artery in 1991, CABG 2014 2. Hypertension 3. Hyperlipidemia 4. Prostate Cancer.     [name] is  a 82 y.o. -year-old gentleman with a history of PTCA approximately 22 years ago.  He's not had any episodes of chest pain or shortness of breath. He stays fairly active although he doesn't exercise at the gym.  He has some problems with some arthritis-type pain in his right hip.  The Lipitor causes a little bit of muscular skeletal pain.  May 10, 2013:  He stopped hib Benicar 2 months ago. His BP has been running low at home.  He has been on a new medicine for his prostate and was concerned that his BP would run too low.   He has been taking a varied dose of benicar - 1 tab, 1/2 tab, 1/4 tablet whenever he thinks he needs it.   Sept. 5, 2014:  [name] presents today for worsening shortness of breath and chest pain.  Last Saturday, he mowed the lawn and spread 200 lbs of lime.  He developed severe CP and started to take some NTG.  The pain finally eased up. Several days later, he had recurrent CP while cleaning out the gutters.    He thinks he was just overheated.   He has been recording his BP and has been getting some low readings.     He has been having some hip pain and does not know if he will be able to walk on the treadmill.   Sept. 24, 2014: [name] was seen recently with some worsening chest pain. He had a stress Myoview study which was interpreted as low risk he has a fixed inferoapical defect.  He exertion, he is still having angina.  Lasts 5 minutes. In the center of his chest. No radiatioin.  Occurs only with exertion ( stating mower or Rototiller- never walking around the house.)   Class II - III angina.    Yesterday, he had some mild baseline CP which worsened while  walking around the grocery store.   Dec. 16, 2014:  [name] has had CABG since I last saw him in the office.  Still has chest tenderness - breathing is good, no CP  Chest wall is still tender.  May 11, 2014:  [name] is doing ok.   Jan. 4, 2015: Doing well  July 21, 2015:  Doing well from a cardiac standpoint  Still lonely ,  Mentally not where he needs to be since the death of his wife .   Visits with his 2 daughters frequently    Feb. 16, 2017:  Still grieving over the death of his wife , 16 months ago .   Were married 84 years.  Got some counseling at [facility] but was not as effective as he would like    Aug. 16, 2017: No CP or dyspnea  Has easy bruising on his arms due to the ASA walks some on the treadmill  No CP   Feb. 12, 2018  Has had some visual issues. Had a head MRI yesterday .   No CP or dyspnea   Oct. 4, 2019:  Has been having more leg swelling recently .  Still eats some salts  Fixes his own meals .   Still eats some canned veggies  Is not more short of breath compared to his usual . No CP  Has been having swelling of his left lower leg      Current Outpatient Medications on File Prior to Visit  Medication Sig Dispense Refill  . aspirin 81 MG EC tablet Take 1 tablet (81 mg total) by mouth daily.    [name] atorvastatin (LIPITOR) 40 MG tablet TAKE 1 TABLET(40 MG) BY MOUTH AT BEDTIME 90 tablet 1  . carvedilol (COREG) 12.5 MG tablet Take 1 tablet (12.5 mg total) by mouth 2 (two) times daily. 180 tablet 3  . furosemide (LASIX) 40 MG tablet Take 1 tablet (40 mg total) by mouth daily. 90 tablet 3  . latanoprost (XALATAN) 0.005 % ophthalmic solution Place 1 drop into both eyes daily.     . Multiple Vitamin (MULTIVITAMIN WITH MINERALS) TABS tablet Take 1 tablet by mouth daily.    . nitroGLYCERIN (NITROSTAT) 0.4 MG SL tablet PLACE 1 TABLET UNDER THE TONUE EVERY 5 MINUTES AS NEEDED FOR CHEST PAIN. 75 tablet 3  . pantoprazole (PROTONIX) 40 MG tablet TAKE 1 TABLET  BY MOUTH EVERY OTHER MORNING 45 tablet 0  . polyethylene glycol (MIRALAX / GLYCOLAX) packet Take 17 g by mouth daily as needed (for constipation).     . potassium chloride (K-DUR) 10 MEQ tablet Take 1 tablet (10 mEq total) by mouth daily. 90 tablet 3  . sertraline (ZOLOFT) 50 MG tablet TAKE 1 TABLET BY MOUTH DAILY 90 tablet 0  . vitamin B-12 (CYANOCOBALAMIN) 100 MCG tablet Take 100 mcg by mouth daily.     No current facility-administered medications on file prior to visit.     Allergies  Allergen Reactions  . Bee Venom Other (See Comments)    Reaction unknown  . Amoxicillin Rash  . Hydrocodone Other (See Comments)    REACTION: disorientation    Past Medical History:  Diagnosis Date  . At risk for sleep apnea    STOP-BANG= 6    SENT TO PCP 05-25-2014  . Benign positional vertigo   . Bladder cancer ([place])   . Coronary artery disease CARDIOLOGIST--  DR Acie Fredrickson  . ED (erectile dysfunction)   . GERD (gastroesophageal reflux disease)   . H/O hiatal hernia   . History of anal fissures   . History of colon polyps   . History of radiation therapy 08/24/12-10/22/12   prostate 6600cGy/33 sessions--  for biochemical recurrence  . Hyperlipidemia   . Hypertension   . OA (osteoarthritis)   . Recurrent prostate adenocarcinoma ([place]) first dx 09/2005--  radial prostatectomy-- gleason 4+3=7, pT2c, negative margin   04/2012--  BIOCHEMICAL  RECURRENCE to 0.33 /  SALVAGE IMRT SEPT to  NOV 2013  . S/P CABG x 2    09/  2014  . S/P dilatation of esophageal stricture    1992  . Sigmoid diverticulosis   . SUI (stress urinary incontinence), male   . Urethral stricture   . Wears glasses     Past Surgical History:  Procedure Laterality Date  . CARDIAC CATHETERIZATION  08-27-2013  DR Martinique   CRITICAL OSTIAL LM STENOSIS/  diffuse disease LAD 40-50%  &   pLCx 30-40%/  . CARDIOVASCULAR STRESS TEST  08-18-2013  DR NASHER   SMALL/ MEDIUM AREA MILD SCAR INFEROSEPTAL WALL/ NO ISCHEMIA/  LOW RISK SCAN/   EF 62%/  LV WALL MOTION WITH SLIGHT DECREASED OF THE  SEPTUM  . CORONARY ANGIOPLASTY  1991   PTCA  of LEFT CFX  . CORONARY ARTERY BYPASS GRAFT N/A 08/27/2013   Procedure: CORONARY ARTERY BYPASS GRAFTING (CABG) times two using left internal mammary and right saphenous vein using endoscope.;  Surgeon: Melrose Nakayama, MD;  Location: [facility];  Service: Open Heart Surgery;  Laterality: N/A;  . CYSTOSCOPY WITH URETHRAL DILATATION N/A 01/26/2014   Procedure: CYSTOSCOPY WITH URETHRAL DILATATION, BLADDER BIOPSY, FOLEY CATHETER;  Surgeon: Molli Hazard, MD;  Location: [facility];  Service: Urology;  Laterality: N/A;  . INGUINAL HERNIA REPAIR Bilateral 1994  . KNEE ARTHROSCOPY Right   . LEFT HEART CATHETERIZATION WITH CORONARY ANGIOGRAM N/A 08/27/2013   Procedure: LEFT HEART CATHETERIZATION WITH CORONARY ANGIOGRAM;  Surgeon: Peter M Martinique, MD;  Location: [facility] CATH LAB;  Service: Cardiovascular;  Laterality: N/A;  . LUMBAR DISC SURGERY  1980'S  . RADICAL RETROPUBIC PROSTATECTOMY W/ BILATERAL PELVIC NODE DISSECTION  11-18-2005  . URETHROTOMY N/A 05/30/2014   Procedure: CYSTOSCOPY  BALLOON DILATION AND DIRECT VISION INTERNAL URETHROTOMY;  Surgeon: Sharyn Creamer, MD;  Location: [facility];  Service: Urology;  Laterality: N/A;    Social History   Tobacco Use  Smoking Status Former Smoker  . Packs/day: 2.00  . Years: 10.00  . Pack years: 20.00  . Types: Cigarettes  . Last attempt to quit: 12/03/1994  . Years since quitting: 23.7  Smokeless Tobacco Never Used    Social History   Substance and Sexual Activity  Alcohol Use Yes   Comment: occasionally    Family History  Problem Relation Age of Onset  . Cancer Father        prostate died 53 something  . Cancer Brother        prostate cancer  . Arthritis Other   . Hyperlipidemia Other   . Hypertension Other     Reviw of Systems:   Physical Exam: Blood pressure (!) 112/48, pulse (!) 51, height 5\' 10"  (1.778 m), weight  145 lb 1.9 oz (65.8 kg), SpO2 99 %.  GEN:  Well nourished, well developed in no acute distress HEENT: Normal NECK: No JVD; No carotid bruits LYMPHATICS: No lymphadenopathy CARDIAC: RRR , no murmurs, rubs, gallops RESPIRATORY:  Clear to auscultation without rales, wheezing or rhonchi  ABDOMEN: Soft, non-tender, non-distended MUSCULOSKELETAL:  No edema; No deformity  SKIN: Warm and dry NEUROLOGIC:  Alert and oriented x 3   ECG:       Assessment / Plan:   1. Coronary artery disease-  Has a decrease in his EF.  Will get a myoview for further assessment of his mildly decreased left ventricular systolic function.  2. Hypertension -   BP is well controlled.   3. Hyperlipidemia-  Well controlled   4. Prostate Cancer.  Is having some hematura . I advised him to see his urologist   5. Abdominal aortic aneurism  -  The AAA is stable by [place] September, 2016.    Abdominal US inSept. 2018 shows AAA of 4.4 cm  We will repeat in 1 year.  6.  Chronic systolic congestive heart failure: Patient has had a decrease in his left ventricular systolic function since his last echo.  He has mild swelling in his left leg. We will get a [place] study for further evaluation.    Mertie Moores, MD  09/04/2018 10:22 AM    [facility] [place],  [place],   [address] Pager [phone]  [phone] Phone: [phone]; Fax: [phone]

## 2018-09-04 NOTE — Patient Instructions (Signed)
Medication Instructions:  Your physician has recommended you make the following change in your medication:   DECREASE Lasix (Furosemide) to 20 mg once daily STOP Kdur (Potassium chloride)  If you need a refill on your cardiac medications before your next appointment, please call your pharmacy.   Lab work: Your physician recommends that you return for lab work in: 2-3 weeks for basic metabolic panel  If you have labs (blood work) drawn today and your tests are completely normal, you will receive your results only by: [name] MyChart Message (if you have MyChart) OR . A paper copy in the mail If you have any lab test that is abnormal or we need to change your treatment, we will call you to review the results.  Testing/Procedures: Your physician has requested that you have a lexiscan myoview. For further information please visit [email or web address]. Please follow instruction sheet, as given.   Follow-Up: At [facility], you and your health needs are our priority.  As part of our continuing mission to provide you with exceptional heart care, we have created designated Provider Care Teams.  These Care Teams include your primary Cardiologist (physician) and Advanced Practice Providers (APPs -  Physician Assistants and Nurse Practitioners) who all work together to provide you with the care you need, when you need it. You will need a follow up appointment in:  6 months.  Please call our office 2 months in advance to schedule this appointment.  You may see Mertie Moores, MD or one of the following Advanced Practice Providers on your designated Care Team: Richardson Dopp, PA-C [place], [place] . Daune Perch, NP

## 2018-09-14 ENCOUNTER — Telehealth (HOSPITAL_COMMUNITY): Payer: Self-pay | Admitting: *Deleted

## 2018-09-14 NOTE — Telephone Encounter (Signed)
Patient given detailed instructions per Myocardial Perfusion Study Information Sheet for the test on 09/16/18 at 1000. Patient notified to arrive 15 minutes early and that it is imperative to arrive on time for appointment to keep from having the test rescheduled.  If you need to cancel or reschedule your appointment, please call the office within 24 hours of your appointment. . Patient verbalized understanding.H[place]acher, Ranae Palms

## 2018-09-16 ENCOUNTER — Other Ambulatory Visit: Payer: Medicare Other | Admitting: *Deleted

## 2018-09-16 ENCOUNTER — Ambulatory Visit (HOSPITAL_COMMUNITY): Payer: Medicare Other | Attending: Cardiology

## 2018-09-16 DIAGNOSIS — I2510 Atherosclerotic heart disease of native coronary artery without angina pectoris: Secondary | ICD-10-CM | POA: Diagnosis not present

## 2018-09-16 DIAGNOSIS — I5022 Chronic systolic (congestive) heart failure: Secondary | ICD-10-CM

## 2018-09-16 LAB — BASIC METABOLIC PANEL
BUN/Creatinine Ratio: 13 (ref 10–24)
BUN: 17 mg/dL (ref 8–27)
CO2: 25 mmol/L (ref 20–29)
Calcium: 9.2 mg/dL (ref 8.6–10.2)
Chloride: 101 mmol/L (ref 96–106)
Creatinine, Ser: 1.33 mg/dL — ABNORMAL HIGH (ref 0.76–1.27)
GFR calc Af Amer: 58 mL/min/{1.73_m2} — ABNORMAL LOW (ref >59)
GFR calc non Af Amer: 50 mL/min/{1.73_m2} — ABNORMAL LOW (ref >59)
Glucose: 100 mg/dL — ABNORMAL HIGH (ref 65–99)
Potassium: 4.6 mmol/L (ref 3.5–5.2)
Sodium: 142 mmol/L (ref 134–144)

## 2018-09-16 LAB — MYOCARDIAL PERFUSION IMAGING
LV dias vol: 88 mL (ref 62–150)
LV sys vol: 34 mL
Peak HR: 69 {beats}/min
Rest HR: 50 {beats}/min
SDS: 1
SRS: 0
SSS: 1
TID: 1.08

## 2018-09-16 MED ORDER — TECHNETIUM TC 99M TETROFOSMIN IV KIT
31.5000 | PACK | Freq: Once | INTRAVENOUS | Status: AC | PRN
  Administered 2018-09-16: 31.5 via INTRAVENOUS
  Filled 2018-09-16: qty 32

## 2018-09-16 MED ORDER — REGADENOSON 0.4 MG/5ML IV SOLN
0.4000 mg | Freq: Once | INTRAVENOUS | Status: AC
  Administered 2018-09-16: 0.4 mg via INTRAVENOUS

## 2018-09-16 MED ORDER — TECHNETIUM TC 99M TETROFOSMIN IV KIT
10.1000 | PACK | Freq: Once | INTRAVENOUS | Status: AC | PRN
  Administered 2018-09-16: 10.1 via INTRAVENOUS
  Filled 2018-09-16: qty 11

## 2018-09-22 DIAGNOSIS — H401131 Primary open-angle glaucoma, bilateral, mild stage: Secondary | ICD-10-CM | POA: Diagnosis not present

## 2018-10-11 ENCOUNTER — Other Ambulatory Visit: Payer: Self-pay | Admitting: Family Medicine

## 2018-10-15 DIAGNOSIS — D303 Benign neoplasm of bladder: Secondary | ICD-10-CM | POA: Diagnosis not present

## 2018-10-15 DIAGNOSIS — N320 Bladder-neck obstruction: Secondary | ICD-10-CM | POA: Diagnosis not present

## 2018-10-15 DIAGNOSIS — Z8546 Personal history of malignant neoplasm of prostate: Secondary | ICD-10-CM | POA: Diagnosis not present

## 2018-10-15 DIAGNOSIS — R311 Benign essential microscopic hematuria: Secondary | ICD-10-CM | POA: Diagnosis not present

## 2018-10-15 DIAGNOSIS — C61 Malignant neoplasm of prostate: Secondary | ICD-10-CM | POA: Diagnosis not present

## 2018-11-03 ENCOUNTER — Other Ambulatory Visit: Payer: Self-pay | Admitting: Adult Health

## 2018-11-04 NOTE — Telephone Encounter (Deleted)
[name], somehow sent this to Dr. Elease Hashimoto.  Please review.

## 2018-11-04 NOTE — Telephone Encounter (Signed)
Go ahead and refill for 6 months but offer physical if he is interested

## 2018-11-04 NOTE — Telephone Encounter (Signed)
Do you need to see the pt for cpx?  Other meds are filled by cardiology.

## 2018-12-25 ENCOUNTER — Telehealth: Payer: Self-pay | Admitting: Family Medicine

## 2018-12-25 ENCOUNTER — Ambulatory Visit: Payer: Self-pay | Admitting: *Deleted

## 2018-12-25 NOTE — Telephone Encounter (Signed)
Please advise 

## 2018-12-25 NOTE — Telephone Encounter (Signed)
Called patient and LMOVM to return call  [place] for [facility] to Discuss results / PCP / recommendations / Schedule patient  Per Dr. Elease Hashimoto: Screening colonoscopy generally not recommended after age 81.  He should not need another flu vaccine during this flu season  CRM Created.

## 2018-12-25 NOTE — Telephone Encounter (Signed)
Call placed to Mr Whichard. Note by Dr Elease Hashimoto today 12/25/2018 read to patient. Pt verbalized understanding of all instructions.

## 2018-12-25 NOTE — Telephone Encounter (Signed)
Copied from [place] [id number]. Topic: Quick Communication - See Telephone Encounter >> Dec 25, 2018  3:53 PM Blase Mess A wrote: CRM for notification. See Telephone encounter for: 12/25/18.  Patient is calling back for lab results please advise

## 2018-12-25 NOTE — Telephone Encounter (Signed)
Summary: vaccine question    Patient requesting a call back from nurse to discuss whether he should get a flu shot. Patient states he had a flu shot in 2019 but inquired if it would be okay to get another, safely. Please advise.      Patient reports he read in the newspaper that a second flu shot is recommended.- he re- read the article and he misread the information- he is fine. Did let him know according to [facility]- a second shot will not harm him- but studies have not shown it to be beneficial to elderly.  Patient also wants to know about recommendations for colonoscopy at his age- I told him that decision is usually made by the GI and may be determined by the health of the patient- there are other testing/screening options available and will have his PCP review what is best for him and let him know.

## 2018-12-25 NOTE — Telephone Encounter (Signed)
Screening colonoscopy generally not recommended after age 81.  He should not need another flu vaccine during this flu season

## 2019-01-10 ENCOUNTER — Other Ambulatory Visit: Payer: Self-pay | Admitting: Family Medicine

## 2019-01-20 DIAGNOSIS — H401131 Primary open-angle glaucoma, bilateral, mild stage: Secondary | ICD-10-CM | POA: Diagnosis not present

## 2019-02-07 ENCOUNTER — Other Ambulatory Visit: Payer: Self-pay | Admitting: Family Medicine

## 2019-03-01 ENCOUNTER — Other Ambulatory Visit: Payer: Self-pay | Admitting: Cardiovascular Disease

## 2019-03-05 ENCOUNTER — Telehealth: Payer: Self-pay | Admitting: Cardiovascular Disease

## 2019-03-05 ENCOUNTER — Telehealth: Payer: Self-pay

## 2019-03-05 NOTE — Telephone Encounter (Signed)
   Primary Cardiologist:  Mertie Moores, MD   Patient contacted.  History reviewed.  No symptoms to suggest any unstable cardiac conditions.  Based on discussion, with current pandemic situation, we will be postponing this appointment for [name] with a plan for f/u in 6-12 wks or sooner if feasible/necessary.  If symptoms change, he has been instructed to contact our office.   Routing to C19 CANCEL pool for tracking (P CV DIV CV19 CANCEL - reason for visit "other.") and assigning priority (1 = 4-6 wks, 2 = 6-12 wks, 3 = >12 wks).   [name], CMA  03/05/2019 3:04 PM         .

## 2019-03-05 NOTE — Telephone Encounter (Signed)
Returning [facility] call, thanks!

## 2019-03-05 NOTE — Telephone Encounter (Signed)
New Message ° ° ° °Pt is returning call  ° ° ° °Please call back  °

## 2019-03-05 NOTE — Telephone Encounter (Signed)
Left message for pt to call back about appt. 

## 2019-03-19 ENCOUNTER — Ambulatory Visit: Payer: Medicare Other | Admitting: Cardiovascular Disease

## 2019-03-19 ENCOUNTER — Telehealth: Payer: Self-pay | Admitting: Cardiovascular Disease

## 2019-03-19 NOTE — Telephone Encounter (Signed)
Spoke with patient who declined virtual visit with NP and decided that he would call us to reschedule his April appointment for sometime in August.

## 2019-03-22 NOTE — Telephone Encounter (Signed)
Appointment scheduled for 07/09/19

## 2019-04-07 ENCOUNTER — Other Ambulatory Visit: Payer: Self-pay | Admitting: Family Medicine

## 2019-04-09 ENCOUNTER — Other Ambulatory Visit: Payer: Self-pay

## 2019-04-09 ENCOUNTER — Ambulatory Visit: Payer: Medicare Other | Admitting: Family Medicine

## 2019-04-09 NOTE — Telephone Encounter (Signed)
Patient has an appointment today at 3pm by Telephone

## 2019-04-09 NOTE — Progress Notes (Signed)
Attempted to call pt twice and no return call.

## 2019-04-12 ENCOUNTER — Other Ambulatory Visit: Payer: Self-pay

## 2019-04-12 ENCOUNTER — Ambulatory Visit (INDEPENDENT_AMBULATORY_CARE_PROVIDER_SITE_OTHER): Payer: Medicare Other | Admitting: Family Medicine

## 2019-04-12 ENCOUNTER — Telehealth: Payer: Self-pay | Admitting: *Deleted

## 2019-04-12 DIAGNOSIS — E538 Deficiency of other specified B group vitamins: Secondary | ICD-10-CM | POA: Diagnosis not present

## 2019-04-12 DIAGNOSIS — I714 Abdominal aortic aneurysm, without rupture, unspecified: Secondary | ICD-10-CM

## 2019-04-12 DIAGNOSIS — I10 Essential (primary) hypertension: Secondary | ICD-10-CM

## 2019-04-12 DIAGNOSIS — I2510 Atherosclerotic heart disease of native coronary artery without angina pectoris: Secondary | ICD-10-CM | POA: Diagnosis not present

## 2019-04-12 DIAGNOSIS — E785 Hyperlipidemia, unspecified: Secondary | ICD-10-CM | POA: Diagnosis not present

## 2019-04-12 MED ORDER — SERTRALINE HCL 50 MG PO TABS: 50.0000 mg | ORAL_TABLET | Freq: Every day | ORAL | 3 refills | Status: DC

## 2019-04-12 NOTE — Telephone Encounter (Signed)
LMVM to reschedule appointment with Dr. Elease Hashimoto from Friday

## 2019-04-12 NOTE — Telephone Encounter (Signed)
Copied from [place] [id number]. Topic: Appointment Scheduling - Scheduling Inquiry for Clinic >> Apr 09, 2019  6:25 PM Nils Flack wrote: Reason for CRM: pt missed appt and would like to r/s  Please call [phone]

## 2019-04-12 NOTE — Progress Notes (Signed)
Patient ID: [name], male   DOB: 1936-11-06, 83 y.o.   MRN: [id number]  This visit type was conducted due to national recommendations for restrictions regarding the COVID-19 pandemic in an effort to limit this patient's exposure and mitigate transmission in our community.   Virtual Visit via Telephone Note  I connected with [name] on 04/12/19 at  2:45 PM EDT by telephone and verified that I am speaking with the correct person using two identifiers.   I discussed the limitations, risks, security and privacy concerns of performing an evaluation and management service by telephone and the availability of in person appointments. I also discussed with the patient that there may be a patient responsible charge related to this service. The patient expressed understanding and agreed to proceed.  Location patient: home Location provider: work or home office Participants present for the call: patient, provider Patient did not have a visit in the prior 7 days to address this/these issue(s).   History of Present Illness: Patient has chronic problems including hypertension, CAD, GERD, history of prostate cancer, hyperlipidemia, B12 deficiency He also has abdominal aortic aneurysm and last imaging was September 2018.  He is overdue for follow-up  History of recurrent depression.  Currently stable on sertraline.  He is requesting refills.  No suicidal ideation.  Overall coping fairly well with current pandemic.  Denies any recent falls.  Medications include Lipitor, carvedilol, Protonix, sertraline.  Denies any recent chest pains.  He states he does had a wisdom tooth pulled.  He is recuperating from that.  His last lipids were April 2019.  Past Medical History:  Diagnosis Date  . At risk for sleep apnea    STOP-BANG= 6    SENT TO PCP 05-25-2014  . Benign positional vertigo   . Bladder cancer ([place])   . Coronary artery disease CARDIOLOGIST--  DR Acie Fredrickson  . ED (erectile dysfunction)   .  GERD (gastroesophageal reflux disease)   . H/O hiatal hernia   . History of anal fissures   . History of colon polyps   . History of radiation therapy 08/24/12-10/22/12   prostate 6600cGy/33 sessions--  for biochemical recurrence  . Hyperlipidemia   . Hypertension   . OA (osteoarthritis)   . Recurrent prostate adenocarcinoma ([place]) first dx 09/2005--  radial prostatectomy-- gleason 4+3=7, pT2c, negative margin   04/2012--  BIOCHEMICAL  RECURRENCE to 0.33 /  SALVAGE IMRT SEPT to  NOV 2013  . S/P CABG x 2    09/  2014  . S/P dilatation of esophageal stricture    1992  . Sigmoid diverticulosis   . SUI (stress urinary incontinence), male   . Urethral stricture   . Wears glasses    Past Surgical History:  Procedure Laterality Date  . CARDIAC CATHETERIZATION  08-27-2013  DR Martinique   CRITICAL OSTIAL LM STENOSIS/  diffuse disease LAD 40-50%  &   pLCx 30-40%/  . CARDIOVASCULAR STRESS TEST  08-18-2013  DR NASHER   SMALL/ MEDIUM AREA MILD SCAR INFEROSEPTAL WALL/ NO ISCHEMIA/  LOW RISK SCAN/  EF 62%/  LV WALL MOTION WITH SLIGHT DECREASED OF THE SEPTUM  . CORONARY ANGIOPLASTY  1991   PTCA  of LEFT CFX  . CORONARY ARTERY BYPASS GRAFT N/A 08/27/2013   Procedure: CORONARY ARTERY BYPASS GRAFTING (CABG) times two using left internal mammary and right saphenous vein using endoscope.;  Surgeon: Melrose Nakayama, MD;  Location: [facility];  Service: Open Heart Surgery;  Laterality: N/A;  . CYSTOSCOPY WITH  URETHRAL DILATATION N/A 01/26/2014   Procedure: CYSTOSCOPY WITH URETHRAL DILATATION, BLADDER BIOPSY, FOLEY CATHETER;  Surgeon: Molli Hazard, MD;  Location: [facility];  Service: Urology;  Laterality: N/A;  . INGUINAL HERNIA REPAIR Bilateral 1994  . KNEE ARTHROSCOPY Right   . LEFT HEART CATHETERIZATION WITH CORONARY ANGIOGRAM N/A 08/27/2013   Procedure: LEFT HEART CATHETERIZATION WITH CORONARY ANGIOGRAM;  Surgeon: Peter M Martinique, MD;  Location: [facility] CATH LAB;  Service: Cardiovascular;  Laterality: N/A;   . LUMBAR DISC SURGERY  1980'S  . RADICAL RETROPUBIC PROSTATECTOMY W/ BILATERAL PELVIC NODE DISSECTION  11-18-2005  . URETHROTOMY N/A 05/30/2014   Procedure: CYSTOSCOPY  BALLOON DILATION AND DIRECT VISION INTERNAL URETHROTOMY;  Surgeon: Sharyn Creamer, MD;  Location: [facility];  Service: Urology;  Laterality: N/A;    reports that he quit smoking about 24 years ago. His smoking use included cigarettes. He has a 20.00 pack-year smoking history. He has never used smokeless tobacco. He reports current alcohol use. He reports that he does not use drugs. family history includes Arthritis in an other family member; Cancer in his brother and father; Hyperlipidemia in an other family member; Hypertension in an other family member. Allergies  Allergen Reactions  . Bee Venom Other (See Comments)    Reaction unknown  . Amoxicillin Rash  . Hydrocodone Other (See Comments)    REACTION: disorientation        Observations/Objective: Patient sounds cheerful and well on the phone. I do not appreciate any SOB. Speech and thought processing are grossly intact. Patient reported vitals:  Assessment and Plan: #1 history of recurrent depression currently stable -Refilled sertraline for 1 year  #2 history of hyperlipidemia/CAD.  Overdue for lipids -Future lab order for May 19 9 AM for lipids and hepatic  #3 history of B12 deficiency -Future lab for B12 level  #4 abdominal aortic aneurysm.  Overdue for follow-up -Set up repeat ultrasound aorta  #5 history of GERD stable on Protonix  Follow Up Instructions:  -As above.  Present next Tuesday for follow-up labs -Setting up repeat abdominal aortic aneurysm assessment   [id number] [id number] [id number] 11-20 [id number] 21-30 I did not refer this patient for an OV in the next 24 hours for this/these issue(s).  I discussed the assessment and treatment plan with the patient. The patient was provided an opportunity to ask questions and all were  answered. The patient agreed with the plan and demonstrated an understanding of the instructions.   The patient was advised to call back or seek an in-person evaluation if the symptoms worsen or if the condition fails to improve as anticipated.  I provided 25 minutes of non-face-to-face time during this encounter.   [name], MD

## 2019-04-20 ENCOUNTER — Other Ambulatory Visit (INDEPENDENT_AMBULATORY_CARE_PROVIDER_SITE_OTHER): Payer: Medicare Other

## 2019-04-20 ENCOUNTER — Other Ambulatory Visit: Payer: Self-pay

## 2019-04-20 DIAGNOSIS — E785 Hyperlipidemia, unspecified: Secondary | ICD-10-CM

## 2019-04-20 DIAGNOSIS — I10 Essential (primary) hypertension: Secondary | ICD-10-CM | POA: Diagnosis not present

## 2019-04-20 DIAGNOSIS — E538 Deficiency of other specified B group vitamins: Secondary | ICD-10-CM

## 2019-04-20 LAB — HEPATIC FUNCTION PANEL
ALT: 20 U/L (ref 0–53)
AST: 21 U/L (ref 0–37)
Albumin: 4.4 g/dL (ref 3.5–5.2)
Alkaline Phosphatase: 68 U/L (ref 39–117)
Bilirubin, Direct: 0.2 mg/dL (ref 0.0–0.3)
Total Bilirubin: 0.8 mg/dL (ref 0.2–1.2)
Total Protein: 7.1 g/dL (ref 6.0–8.3)

## 2019-04-20 LAB — LIPID PANEL
Cholesterol: 125 mg/dL (ref 0–200)
HDL: 37.3 mg/dL — ABNORMAL LOW (ref >39.00)
LDL Cholesterol: 58 mg/dL (ref 0–99)
NonHDL: 88.07
Total CHOL/HDL Ratio: 3
Triglycerides: 149 mg/dL (ref 0.0–149.0)
VLDL: 29.8 mg/dL (ref 0.0–40.0)

## 2019-04-20 LAB — BASIC METABOLIC PANEL
BUN: 23 mg/dL (ref 6–23)
CO2: 32 mEq/L (ref 19–32)
Calcium: 9.4 mg/dL (ref 8.4–10.5)
Chloride: 101 mEq/L (ref 96–112)
Creatinine, Ser: 1.48 mg/dL (ref 0.40–1.50)
GFR: 45.59 mL/min — ABNORMAL LOW (ref >60.00)
Glucose, Bld: 94 mg/dL (ref 70–99)
Potassium: 4.6 mEq/L (ref 3.5–5.1)
Sodium: 140 mEq/L (ref 135–145)

## 2019-04-20 LAB — VITAMIN B12: Vitamin B-12: 1019 pg/mL — ABNORMAL HIGH (ref 211–911)

## 2019-04-21 DIAGNOSIS — H401131 Primary open-angle glaucoma, bilateral, mild stage: Secondary | ICD-10-CM | POA: Diagnosis not present

## 2019-04-27 ENCOUNTER — Ambulatory Visit
Admission: RE | Admit: 2019-04-27 | Discharge: 2019-04-27 | Disposition: A | Discharge status: Home / Self Care | Payer: Medicare Other | Source: Ambulatory Visit | Attending: Family Medicine | Admitting: Family Medicine

## 2019-04-27 DIAGNOSIS — I714 Abdominal aortic aneurysm, without rupture, unspecified: Secondary | ICD-10-CM

## 2019-05-11 ENCOUNTER — Other Ambulatory Visit: Payer: Self-pay | Admitting: Family Medicine

## 2019-05-21 ENCOUNTER — Other Ambulatory Visit: Payer: Self-pay

## 2019-05-21 ENCOUNTER — Ambulatory Visit (INDEPENDENT_AMBULATORY_CARE_PROVIDER_SITE_OTHER): Payer: Medicare Other | Admitting: Adult Health

## 2019-05-21 ENCOUNTER — Encounter: Payer: Self-pay | Admitting: Adult Health

## 2019-05-21 DIAGNOSIS — S61412A Laceration without foreign body of left hand, initial encounter: Secondary | ICD-10-CM

## 2019-05-21 DIAGNOSIS — Z23 Encounter for immunization: Secondary | ICD-10-CM

## 2019-05-21 DIAGNOSIS — I2510 Atherosclerotic heart disease of native coronary artery without angina pectoris: Secondary | ICD-10-CM | POA: Diagnosis not present

## 2019-05-21 NOTE — Progress Notes (Signed)
Subjective:    Patient ID: [name], male    DOB: [birth year], 83 y.o.   MRN: [id number]  HPI 83 year old male who  has a past medical history of At risk for sleep apnea, Benign positional vertigo, Bladder cancer ([place]), Coronary artery disease (CARDIOLOGIST--  DR Acie Fredrickson), ED (erectile dysfunction), GERD (gastroesophageal reflux disease), H/O hiatal hernia, History of anal fissures, History of colon polyps, History of radiation therapy (08/24/12-10/22/12), Hyperlipidemia, Hypertension, OA (osteoarthritis), Recurrent prostate adenocarcinoma ([place]) (first dx 09/2005--  radial prostatectomy-- gleason 4+3=7, pT2c, negative margin), S/P CABG x 2, S/P dilatation of esophageal stricture, Sigmoid diverticulosis, SUI (stress urinary incontinence), male, Urethral stricture, and Wears glasses.  He presents to the clinic today an acute injury. About an hour prior to arrival he was putting an umbrella in the holder and got his left pinkie caught between the [employer] and Tour manager. He has had bleeding since.   Last Tetanus was in 2011   Review of Systems See HPI   Past Medical History:  Diagnosis Date  . At risk for sleep apnea    STOP-BANG= 6    SENT TO PCP 05-25-2014  . Benign positional vertigo   . Bladder cancer ([place])   . Coronary artery disease CARDIOLOGIST--  DR Acie Fredrickson  . ED (erectile dysfunction)   . GERD (gastroesophageal reflux disease)   . H/O hiatal hernia   . History of anal fissures   . History of colon polyps   . History of radiation therapy 08/24/12-10/22/12   prostate 6600cGy/33 sessions--  for biochemical recurrence  . Hyperlipidemia   . Hypertension   . OA (osteoarthritis)   . Recurrent prostate adenocarcinoma ([place]) first dx 09/2005--  radial prostatectomy-- gleason 4+3=7, pT2c, negative margin   04/2012--  BIOCHEMICAL  RECURRENCE to 0.33 /  SALVAGE IMRT SEPT to  NOV 2013  . S/P CABG x 2    09/  2014  . S/P dilatation of esophageal stricture    1992  . Sigmoid  diverticulosis   . SUI (stress urinary incontinence), male   . Urethral stricture   . Wears glasses     Social History   Socioeconomic History  . Marital status: Single    Spouse name: Not on file  . Number of children: 2  . Years of education: 24  . Highest education level: Not on file  Occupational History  . Occupation: Retired    Fish farm manager: RETIRED  Social Needs  . Financial resource strain: Not on file  . Food insecurity    Worry: Not on file    Inability: Not on file  . Transportation needs    Medical: Not on file    Non-medical: Not on file  Tobacco Use  . Smoking status: Former Smoker    Packs/day: 2.00    Years: 10.00    Pack years: 20.00    Types: Cigarettes    Quit date: 12/03/1994    Years since quitting: 24.4  . Smokeless tobacco: Never Used  Substance and Sexual Activity  . Alcohol use: Yes    Comment: occasionally  . Drug use: No  . Sexual activity: Not on file  Lifestyle  . Physical activity    Days per week: Not on file    Minutes per session: Not on file  . Stress: Not on file  Relationships  . Social Herbalist on phone: Not on file    Gets together: Not on file    Attends religious service:  Not on file    Active member of club or organization: Not on file    Attends meetings of clubs or organizations: Not on file    Relationship status: Not on file  . Intimate partner violence    Fear of current or ex partner: Not on file    Emotionally abused: Not on file    Physically abused: Not on file    Forced sexual activity: Not on file  Other Topics Concern  . Not on file  Social History Narrative   Lives alone   Caffeine use: Coffee- 3 cups daily   Soda sometimes.     Past Surgical History:  Procedure Laterality Date  . CARDIAC CATHETERIZATION  08-27-2013  DR Martinique   CRITICAL OSTIAL LM STENOSIS/  diffuse disease LAD 40-50%  &   pLCx 30-40%/  . CARDIOVASCULAR STRESS TEST  08-18-2013  DR NASHER   SMALL/ MEDIUM AREA MILD SCAR  INFEROSEPTAL WALL/ NO ISCHEMIA/  LOW RISK SCAN/  EF 62%/  LV WALL MOTION WITH SLIGHT DECREASED OF THE SEPTUM  . CORONARY ANGIOPLASTY  1991   PTCA  of LEFT CFX  . CORONARY ARTERY BYPASS GRAFT N/A 08/27/2013   Procedure: CORONARY ARTERY BYPASS GRAFTING (CABG) times two using left internal mammary and right saphenous vein using endoscope.;  Surgeon: Melrose Nakayama, MD;  Location: [facility];  Service: Open Heart Surgery;  Laterality: N/A;  . CYSTOSCOPY WITH URETHRAL DILATATION N/A 01/26/2014   Procedure: CYSTOSCOPY WITH URETHRAL DILATATION, BLADDER BIOPSY, FOLEY CATHETER;  Surgeon: Molli Hazard, MD;  Location[facility];  Service: Urology;  Laterality: N/A;  . INGUINAL HERNIA REPAIR Bilateral 1994  . KNEE ARTHROSCOPY Right   . LEFT HEART CATHETERIZATION WITH CORONARY ANGIOGRAM N/A 08/27/2013   Procedure: LEFT HEART CATHETERIZATION WITH CORONARY ANGIOGRAM;  Surgeon: Peter M Martinique, MD;  Location[facility] CATH LAB;  Service: Cardiovascular;  Laterality: N/A;  . LUMBAR DISC SURGERY  1980'S  . RADICAL RETROPUBIC PROSTATECTOMY W/ BILATERAL PELVIC NODE DISSECTION  11-18-2005  . URETHROTOMY N/A 05/30/2014   Procedure: CYSTOSCOPY  BALLOON DILATION AND DIRECT VISION INTERNAL URETHROTOMY;  Surgeon: Sharyn Creamer, MD;  Location: [facility];  Service: Urology;  Laterality: N/A;    Family History  Problem Relation Age of Onset  . Cancer Father        prostate died 25 something  . Cancer Brother        prostate cancer  . Arthritis Other   . Hyperlipidemia Other   . Hypertension Other     Allergies  Allergen Reactions  . Bee Venom Other (See Comments)    Reaction unknown  . Amoxicillin Rash  . Hydrocodone Other (See Comments)    REACTION: disorientation    Current Outpatient Medications on File Prior to Visit  Medication Sig Dispense Refill  . aspirin 81 MG EC tablet Take 1 tablet (81 mg total) by mouth daily.    [name] atorvastatin (LIPITOR) 40 MG tablet TAKE 1 TABLET(40 MG) BY  MOUTH AT BEDTIME 90 tablet 0  . carvedilol (COREG) 12.5 MG tablet Take 1 tablet (12.5 mg total) by mouth 2 (two) times daily. Pt must keep upcoming appt for further refills. Thanks 180 tablet 1  . furosemide (LASIX) 20 MG tablet Take 1 tablet (20 mg total) by mouth daily. 90 tablet 3  . latanoprost (XALATAN) 0.005 % ophthalmic solution Place 1 drop into both eyes daily.     . Multiple Vitamin (MULTIVITAMIN WITH MINERALS) TABS tablet Take 1 tablet  by mouth daily.    . nitroGLYCERIN (NITROSTAT) 0.4 MG SL tablet PLACE 1 TABLET UNDER THE TONUE EVERY 5 MINUTES AS NEEDED FOR CHEST PAIN. 75 tablet 3  . pantoprazole (PROTONIX) 40 MG tablet TAKE 1 TABLET BY MOUTH EVERY OTHER MORNING 45 tablet 5  . polyethylene glycol (MIRALAX / GLYCOLAX) packet Take 17 g by mouth daily as needed (for constipation).     . sertraline (ZOLOFT) 50 MG tablet Take 1 tablet (50 mg total) by mouth daily. 90 tablet 3  . vitamin B-12 (CYANOCOBALAMIN) 100 MCG tablet Take 100 mcg by mouth daily.     No current facility-administered medications on file prior to visit.     There were no vitals taken for this visit.      Objective:   Physical Exam Vitals signs and nursing note reviewed.  Skin:    General: Skin is warm and dry.     Capillary Refill: Capillary refill takes less than 2 seconds.     Comments: Superficial laceration to lateral aspect of left pinkie.        Assessment & Plan:  1. Need for prophylactic vaccination with tetanus-diphtheria (Td)  - Td : Tetanus/diphtheria >7yo Preservative  free  2. Superficial laceration of hand, left, initial encounter - Wound cleaned and antibacterial ointment applied. No need for sutures.  - Will update tetanus.  - Advised to monitor for signs of infection and follow up if needed - Td : Tetanus/diphtheria >7yo Preservative  free   Dorothyann Peng, NP

## 2019-06-29 ENCOUNTER — Ambulatory Visit: Payer: Medicare Other

## 2019-07-09 ENCOUNTER — Encounter: Payer: Self-pay | Admitting: Cardiovascular Disease

## 2019-07-09 ENCOUNTER — Ambulatory Visit (INDEPENDENT_AMBULATORY_CARE_PROVIDER_SITE_OTHER): Payer: Medicare Other | Admitting: Cardiovascular Disease

## 2019-07-09 ENCOUNTER — Other Ambulatory Visit: Payer: Self-pay

## 2019-07-09 VITALS — BP 138/74 | HR 55 | Ht 68.5 in | Wt 140.6 lb

## 2019-07-09 DIAGNOSIS — I5022 Chronic systolic (congestive) heart failure: Secondary | ICD-10-CM | POA: Diagnosis not present

## 2019-07-09 DIAGNOSIS — I2510 Atherosclerotic heart disease of native coronary artery without angina pectoris: Secondary | ICD-10-CM

## 2019-07-09 DIAGNOSIS — I10 Essential (primary) hypertension: Secondary | ICD-10-CM

## 2019-07-09 DIAGNOSIS — I714 Abdominal aortic aneurysm, without rupture, unspecified: Secondary | ICD-10-CM

## 2019-07-09 MED ORDER — LOSARTAN POTASSIUM 25 MG PO TABS: 25.0000 mg | ORAL_TABLET | Freq: Every day | ORAL | 3 refills | Status: DC

## 2019-07-09 NOTE — Patient Instructions (Addendum)
Medication Instructions:  Your physician has recommended you make the following change in your medication:  START Losartan 25 mg once daily   Lab work: Your physician recommends that you return for lab work in: 2-3 weeks for basic metabolic panel    Testing/Procedures: Your physician has requested that you have an abdominal aorta duplex in 1 year. During this test, an ultrasound is used to evaluate the aorta. Allow 30 minutes for this exam. Do not eat after midnight the day before and avoid carbonated beverages   Follow-Up: At [facility], you and your health needs are our priority.  As part of our continuing mission to provide you with exceptional heart care, we have created designated Provider Care Teams.  These Care Teams include your primary Cardiologist (physician) and Advanced Practice Providers (APPs -  Physician Assistants and Nurse Practitioners) who all work together to provide you with the care you need, when you need it. You will need a follow up appointment in:  6 months.  Please call our office 2 months in advance to schedule this appointment.  You may see Mertie Moores, MD or one of the following Advanced Practice Providers on your designated Care Team: Richardson Dopp, PA-C [place], [place] . Daune Perch, NP

## 2019-07-09 NOTE — Progress Notes (Signed)
[name] Date of Birth  1936/11/09         [address]    [place]    [place], [place]  [address]          Problem list: 1. Coronary artery disease- status post remote PTCA to the left complex artery in 1991, CABG 2014 2. Hypertension 3. Hyperlipidemia 4. Prostate Cancer.     [name] is  a 83 y.o. -year-old gentleman with a history of PTCA approximately 22 years ago.  He's not had any episodes of chest pain or shortness of breath. He stays fairly active although he doesn't exercise at the gym.  He has some problems with some arthritis-type pain in his right hip.  The Lipitor causes a little bit of muscular skeletal pain.  May 10, 2013:  He stopped hib Benicar 2 months ago. His BP has been running low at home.  He has been on a new medicine for his prostate and was concerned that his BP would run too low.   He has been taking a varied dose of benicar - 1 tab, 1/2 tab, 1/4 tablet whenever he thinks he needs it.   Sept. 5, 2014:  [name] presents today for worsening shortness of breath and chest pain.  Last Saturday, he mowed the lawn and spread 200 lbs of lime.  He developed severe CP and started to take some NTG.  The pain finally eased up. Several days later, he had recurrent CP while cleaning out the gutters.    He thinks he was just overheated.   He has been recording his BP and has been getting some low readings.     He has been having some hip pain and does not know if he will be able to walk on the treadmill.   Sept. 24, 2014: [name] was seen recently with some worsening chest pain. He had a stress Myoview study which was interpreted as low risk he has a fixed inferoapical defect.  He exertion, he is still having angina.  Lasts 5 minutes. In the center of his chest. No radiatioin.  Occurs only with exertion ( stating mower or Rototiller- never walking around the house.)   Class II - III angina.    Yesterday, he had some mild baseline CP which worsened while  walking around the grocery store.   Dec. 16, 2014:  [name] has had CABG since I last saw him in the office.  Still has chest tenderness - breathing is good, no CP  Chest wall is still tender.  May 11, 2014:  [name] is doing ok.   Jan. 4, 2015: Doing well  July 21, 2015:  Doing well from a cardiac standpoint  Still lonely ,  Mentally not where he needs to be since the death of his wife .   Visits with his 2 daughters frequently    Feb. 16, 2017:  Still grieving over the death of his wife , 16 months ago .   Were married 90 years.  Got some counseling at [facility] but was not as effective as he would like    Aug. 16, 2017: No CP or dyspnea  Has easy bruising on his arms due to the ASA walks some on the treadmill  No CP   Feb. 12, 2018  Has had some visual issues. Had a head MRI yesterday .   No CP or dyspnea   Oct. 4, 2019:  Has been having more leg swelling recently .  Still eats some salts  Fixes his own meals .   Still eats some canned veggies  Is not more short of breath compared to his usual . No CP  Has been having swelling of his left lower leg   Aug. 7, 2020:  Doing well.   .  Little of exercise.   Treadmill 30 min a day  No CP or dyspnea with walking    Current Outpatient Medications on File Prior to Visit  Medication Sig Dispense Refill  . aspirin 81 MG EC tablet Take 1 tablet (81 mg total) by mouth daily.    [name] atorvastatin (LIPITOR) 40 MG tablet TAKE 1 TABLET(40 MG) BY MOUTH AT BEDTIME 90 tablet 0  . carvedilol (COREG) 12.5 MG tablet Take 1 tablet (12.5 mg total) by mouth 2 (two) times daily. Pt must keep upcoming appt for further refills. Thanks 180 tablet 1  . furosemide (LASIX) 20 MG tablet Take 1 tablet (20 mg total) by mouth daily. 90 tablet 3  . latanoprost (XALATAN) 0.005 % ophthalmic solution Place 1 drop into both eyes daily.     . Multiple Vitamin (MULTIVITAMIN WITH MINERALS) TABS tablet Take 1 tablet by mouth daily.    . nitroGLYCERIN  (NITROSTAT) 0.4 MG SL tablet PLACE 1 TABLET UNDER THE TONUE EVERY 5 MINUTES AS NEEDED FOR CHEST PAIN. 75 tablet 3  . pantoprazole (PROTONIX) 40 MG tablet TAKE 1 TABLET BY MOUTH EVERY OTHER MORNING 45 tablet 5  . polyethylene glycol (MIRALAX / GLYCOLAX) packet Take 17 g by mouth daily as needed (for constipation).     . sertraline (ZOLOFT) 50 MG tablet Take 1 tablet (50 mg total) by mouth daily. 90 tablet 3  . vitamin B-12 (CYANOCOBALAMIN) 100 MCG tablet Take 100 mcg by mouth daily.     No current facility-administered medications on file prior to visit.     Allergies  Allergen Reactions  . Bee Venom Other (See Comments)    Reaction unknown  . Amoxicillin Rash  . Hydrocodone Other (See Comments)    REACTION: disorientation    Past Medical History:  Diagnosis Date  . At risk for sleep apnea    STOP-BANG= 6    SENT TO PCP 05-25-2014  . Benign positional vertigo   . Bladder cancer ([place])   . Coronary artery disease CARDIOLOGIST--  DR Acie Fredrickson  . ED (erectile dysfunction)   . GERD (gastroesophageal reflux disease)   . H/O hiatal hernia   . History of anal fissures   . History of colon polyps   . History of radiation therapy 08/24/12-10/22/12   prostate [id number]/33 sessions--  for biochemical recurrence  . Hyperlipidemia   . Hypertension   . OA (osteoarthritis)   . Recurrent prostate adenocarcinoma ([place]) first dx 09/2005--  radial prostatectomy-- gleason 4+3=7, pT2c, negative margin   04/2012--  BIOCHEMICAL  RECURRENCE to 0.33 /  SALVAGE IMRT SEPT to  NOV 2013  . S/P CABG x 2    09/  2014  . S/P dilatation of esophageal stricture    1992  . Sigmoid diverticulosis   . SUI (stress urinary incontinence), male   . Urethral stricture   . Wears glasses     Past Surgical History:  Procedure Laterality Date  . CARDIAC CATHETERIZATION  08-27-2013  DR Martinique   CRITICAL OSTIAL LM STENOSIS/  diffuse disease LAD 40-50%  &   pLCx 30-40%/  . CARDIOVASCULAR STRESS TEST  08-18-2013  DR NASHER    SMALL/ MEDIUM AREA MILD SCAR INFEROSEPTAL  WALL/ NO ISCHEMIA/  LOW RISK SCAN/  EF 62%/  LV WALL MOTION WITH SLIGHT DECREASED OF THE SEPTUM  . CORONARY ANGIOPLASTY  1991   PTCA  of LEFT CFX  . CORONARY ARTERY BYPASS GRAFT N/A 08/27/2013   Procedure: CORONARY ARTERY BYPASS GRAFTING (CABG) times two using left internal mammary and right saphenous vein using endoscope.;  Surgeon: Melrose Nakayama, MD;  Location: [facility];  Service: Open Heart Surgery;  Laterality: N/A;  . CYSTOSCOPY WITH URETHRAL DILATATION N/A 01/26/2014   Procedure: CYSTOSCOPY WITH URETHRAL DILATATION, BLADDER BIOPSY, FOLEY CATHETER;  Surgeon: Molli Hazard, MD;  Location: [facility];  Service: Urology;  Laterality: N/A;  . INGUINAL HERNIA REPAIR Bilateral 1994  . KNEE ARTHROSCOPY Right   . LEFT HEART CATHETERIZATION WITH CORONARY ANGIOGRAM N/A 08/27/2013   Procedure: LEFT HEART CATHETERIZATION WITH CORONARY ANGIOGRAM;  Surgeon: Peter M Martinique, MD;  Location: [facility] CATH LAB;  Service: Cardiovascular;  Laterality: N/A;  . LUMBAR DISC SURGERY  1980'S  . RADICAL RETROPUBIC PROSTATECTOMY W/ BILATERAL PELVIC NODE DISSECTION  11-18-2005  . URETHROTOMY N/A 05/30/2014   Procedure: CYSTOSCOPY  BALLOON DILATION AND DIRECT VISION INTERNAL URETHROTOMY;  Surgeon: Sharyn Creamer, MD;  [facility];  Service: Urology;  Laterality: N/A;    Social History   Tobacco Use  Smoking Status Former Smoker  . Packs/day: 2.00  . Years: 10.00  . Pack years: 20.00  . Types: Cigarettes  . Quit date: 12/03/1994  . Years since quitting: 24.6  Smokeless Tobacco Never Used    Social History   Substance and Sexual Activity  Alcohol Use Yes   Comment: occasionally    Family History  Problem Relation Age of Onset  . Cancer Father        prostate died 28 something  . Cancer Brother        prostate cancer  . Arthritis Other   . Hyperlipidemia Other   . Hypertension Other     Reviw of Systems:    Physical Exam:  Blood pressure 138/74, pulse (!) 55, height 5' 8.5" (1.74 m), weight 140 lb 9.6 oz (63.8 kg), SpO2 98 %.  GEN:  Well nourished, well developed in no acute distress HEENT: Normal NECK: No JVD; No carotid bruits LYMPHATICS: No lymphadenopathy CARDIAC: RRR   RESPIRATORY:  Clear to auscultation without rales, wheezing or rhonchi  ABDOMEN:  Midline pulsitile mass.  MUSCULOSKELETAL:  No edema; No deformity  SKIN: Warm and dry NEUROLOGIC:  Alert and oriented x 3   ECG: July 09, 2019: Sinus bradycardia 53.  Right axis deviation.  No ST or T wave changes.       Assessment / Plan:   1. Coronary artery disease-   mildly reduced EF of 45-50%. . No angina   2. Hypertension -   BP is well controlled.   3. Hyperlipidemia- will check lipids in 1 year      4. Prostate Cancer.  Stable, sees urology   5. Abdominal aortic aneurism  -   aortic US shows a 4.4 cm aneurism  Abdominal dupex in 1 year   6.  Chronic systolic congestive heart failure: Patient has had a decrease in his left ventricular systolic function since his last echo.  He has  We will start him on losartan 50 mg a day.  Continue carvedilol 12.5 mg a day.  Continue Lasix.  Myoview study from last year was normal.    Mertie Moores, MD  07/09/2019 3:24 PM  [address],  [place], [place]  [address] Pager [phone] Phone: [phone]; Fax: [phone]

## 2019-07-26 ENCOUNTER — Other Ambulatory Visit: Payer: Self-pay

## 2019-07-26 ENCOUNTER — Other Ambulatory Visit: Payer: Self-pay | Admitting: Nurse Practitioner

## 2019-07-26 ENCOUNTER — Other Ambulatory Visit: Payer: Medicare Other | Admitting: *Deleted

## 2019-07-26 DIAGNOSIS — I5022 Chronic systolic (congestive) heart failure: Secondary | ICD-10-CM

## 2019-07-26 DIAGNOSIS — I10 Essential (primary) hypertension: Secondary | ICD-10-CM | POA: Diagnosis not present

## 2019-07-26 LAB — BASIC METABOLIC PANEL
BUN/Creatinine Ratio: 15 (ref 10–24)
BUN: 25 mg/dL (ref 8–27)
CO2: 23 mmol/L (ref 20–29)
Calcium: 9.3 mg/dL (ref 8.6–10.2)
Chloride: 106 mmol/L (ref 96–106)
Creatinine, Ser: 1.67 mg/dL — ABNORMAL HIGH (ref 0.76–1.27)
GFR calc Af Amer: 44 mL/min/{1.73_m2} — ABNORMAL LOW (ref >59)
GFR calc non Af Amer: 38 mL/min/{1.73_m2} — ABNORMAL LOW (ref >59)
Glucose: 93 mg/dL (ref 65–99)
Potassium: 4.2 mmol/L (ref 3.5–5.2)
Sodium: 141 mmol/L (ref 134–144)

## 2019-08-08 ENCOUNTER — Other Ambulatory Visit: Payer: Self-pay | Admitting: Family Medicine

## 2019-08-24 DIAGNOSIS — Z961 Presence of intraocular lens: Secondary | ICD-10-CM | POA: Diagnosis not present

## 2019-08-24 DIAGNOSIS — H1851 Endothelial corneal dystrophy: Secondary | ICD-10-CM | POA: Diagnosis not present

## 2019-08-24 DIAGNOSIS — H401131 Primary open-angle glaucoma, bilateral, mild stage: Secondary | ICD-10-CM | POA: Diagnosis not present

## 2019-09-05 ENCOUNTER — Other Ambulatory Visit: Payer: Self-pay | Admitting: Cardiovascular Disease

## 2019-09-28 ENCOUNTER — Ambulatory Visit (INDEPENDENT_AMBULATORY_CARE_PROVIDER_SITE_OTHER): Payer: Medicare Other | Admitting: Family Medicine

## 2019-09-28 ENCOUNTER — Other Ambulatory Visit: Payer: Self-pay

## 2019-09-28 ENCOUNTER — Encounter: Payer: Self-pay | Admitting: Family Medicine

## 2019-09-28 VITALS — BP 118/72 | HR 54 | Temp 97.6°F | Resp 16 | Ht 68.5 in | Wt 137.0 lb

## 2019-09-28 DIAGNOSIS — I10 Essential (primary) hypertension: Secondary | ICD-10-CM

## 2019-09-28 DIAGNOSIS — F4321 Adjustment disorder with depressed mood: Secondary | ICD-10-CM

## 2019-09-28 DIAGNOSIS — K219 Gastro-esophageal reflux disease without esophagitis: Secondary | ICD-10-CM

## 2019-09-28 DIAGNOSIS — I714 Abdominal aortic aneurysm, without rupture, unspecified: Secondary | ICD-10-CM

## 2019-09-28 DIAGNOSIS — Z0000 Encounter for general adult medical examination without abnormal findings: Secondary | ICD-10-CM | POA: Diagnosis not present

## 2019-09-28 DIAGNOSIS — I2510 Atherosclerotic heart disease of native coronary artery without angina pectoris: Secondary | ICD-10-CM | POA: Diagnosis not present

## 2019-09-28 DIAGNOSIS — N183 Chronic kidney disease, stage 3 unspecified: Secondary | ICD-10-CM | POA: Diagnosis not present

## 2019-09-28 NOTE — Patient Instructions (Signed)
Try over the counter Pepcid 20 mg once or twice daily as needed for reflux symptoms.  Keep up the exercise.

## 2019-09-28 NOTE — Progress Notes (Signed)
Subjective:     Patient ID: [name], male   DOB: 1936-03-23, 83 y.o.   MRN: [id number]  HPI  Here for Medicare Wellness Visit and medical follow up  Chronic problems include history of CAD, hypertension, abdominal aortic aneurysm, GERD, history of prostate cancer, history of B12 deficiency, and hyperlipidemia.  He is followed by urology still regarding his history of prostate cancer.  Recently saw cardiologist.  His lipids were checked in May and stable.  He had abdominal aneurysm recheck in May which is stable.  He has been scheduled for repeat aneurysm ultrasound evaluation for 08-09-2020.  His wife passed away 5 years ago and he still struggles with that at times.  He is currently on sertraline for depression and he does feel that has helped.  He has 2 daughters who are nearby and stays in touch with them.  No recent falls.  He has GERD.  He recently ran out of Protonix.  His only had breakthrough symptoms occasionally after that.  No dysphagia.  No pain with swallowing.  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  He has chronic kidney disease by recent labs.  This appears to be stage III.  Recent addition of low-dose losartan for his blood pressure and tolerating well.    1.  Risk factors based on Past Medical , Social, and Family history reviewed and as indicated above with no changes  2.  Limitations in physical activities None.  No recent falls.  He does exercise on treadmill 30 minutes most days a week and also does lots of yard work.  3.  Depression/mood No active depression or anxiety issues.  Does have history of depression as above currently treated with sertraline but stable.  PHQ 2 equals 1  4.  Hearing some subjective deficits.  He refuses hearing test or aid.   5.  ADLs independent in all.  6.  Cognitive function (orientation to time and place, language, writing, speech,memory) no short or long term memory issues.  Language and judgement  intact.  7.  Home Safety no issues  8.  Height, weight, and visual acuity.all stable. Wt Readings from Last 3 Encounters:  09/28/19 129 lb (58.5 kg)  07/09/19 140 lb 9.6 oz (63.8 kg)  09/16/18 145 lb (65.8 kg)    9.  Counseling discussed  Counseled regarding age and gender appropriate preventative screenings and immunizations.  10. Recommendation of preventive services.  Flu vaccine already given.  Other immunizations up-to-date.  11. Labs based on risk factors-none indicated at this time  12. Care Plan- as below.  67. Other Providers  Dr Acie Fredrickson, cardiology,  Urology  14. Written schedule of screening/prevention services given to patient. Health Maintenance  Topic Date Due  . TETANUS/TDAP  05/20/2029  . INFLUENZA VACCINE  Completed  . PNA vac Low Risk Adult  Completed      Review of Systems  Constitutional: Negative for appetite change, fatigue and unexpected weight change.  Eyes: Negative for visual disturbance.  Respiratory: Negative for cough, chest tightness and shortness of breath.   Cardiovascular: Negative for chest pain, palpitations and leg swelling.  Gastrointestinal: Negative for abdominal pain.  Endocrine: Negative for polydipsia and polyuria.  Genitourinary: Negative for dysuria.  Neurological: Negative for dizziness, syncope, weakness, light-headedness and headaches.  Hematological: Negative for adenopathy.  Psychiatric/Behavioral: Negative for confusion.       Objective:   Physical Exam Constitutional:      Appearance: Normal appearance.  HENT:  Right Ear: Tympanic membrane normal.     Left Ear: Tympanic membrane normal.  Neck:     Musculoskeletal: Neck supple.  Cardiovascular:     Rate and Rhythm: Normal rate and regular rhythm.  Pulmonary:     Effort: Pulmonary effort is normal.     Breath sounds: Normal breath sounds.  Abdominal:     Palpations: Abdomen is soft. There is no mass.     Tenderness: There is no abdominal tenderness. There  is no guarding or rebound.  Musculoskeletal:     Right lower leg: No edema.     Left lower leg: No edema.  Lymphadenopathy:     Cervical: No cervical adenopathy.  Skin:    Findings: No rash.  Neurological:     General: No focal deficit present.     Mental Status: He is alert and oriented to person, place, and time.  Psychiatric:        Mood and Affect: Mood normal.        Assessment:     #1 Medicare subsequent annual wellness visit.  We discussed the following issues below  #2 hypertension stable  #3 GERD currently stable off Protonix.  #4 history of abdominal aortic aneurysm with recent ultrasound back in May with stable aneurysm  #5 chronic kidney disease stage III  #6 history of CAD/dyslipidemia    Plan:     -Flu vaccine already given  -Discussed fall prevention  -No labs indicated at this time  -Continue yearly ultrasound to assess abdominal aortic aneurysm  -Suggested he try over-the-counter Pepcid 20 mg once or twice daily for any breakthrough GERD symptoms and be in touch if that is not adequate  -Routine follow-up in 6 months and sooner as needed  [name] MD [facility]

## 2019-09-29 ENCOUNTER — Telehealth: Payer: Self-pay

## 2019-09-29 NOTE — Telephone Encounter (Signed)
Copied from [place] [phone]. Topic: General - Other >> Sep 29, 2019  2:12 PM Keene Breath wrote: Reason for CRM: Patient called to see if he needs to get his thyroid checked.  He said he thought that the doctor wanted him to have it checked, but he was not sure.  Please call to discuss.  CB# [phone]

## 2019-09-29 NOTE — Telephone Encounter (Signed)
We had initially discussed checking thyroid because his initial weight that was listed was 129 pounds but this was in error.  He had actually not lost any substantial weight and so we decided against testing at this time.

## 2019-09-30 NOTE — Telephone Encounter (Signed)
Spoke with pt and informed him of Dr. Erick Blinks message. Pt understood and had no additional questions at this time.

## 2019-10-19 DIAGNOSIS — Z8551 Personal history of malignant neoplasm of bladder: Secondary | ICD-10-CM | POA: Diagnosis not present

## 2019-10-19 DIAGNOSIS — Z8546 Personal history of malignant neoplasm of prostate: Secondary | ICD-10-CM | POA: Diagnosis not present

## 2019-10-19 DIAGNOSIS — N320 Bladder-neck obstruction: Secondary | ICD-10-CM | POA: Diagnosis not present

## 2019-10-24 ENCOUNTER — Other Ambulatory Visit: Payer: Self-pay | Admitting: Cardiovascular Disease

## 2019-11-15 ENCOUNTER — Other Ambulatory Visit: Payer: Self-pay | Admitting: *Deleted

## 2019-11-15 ENCOUNTER — Telehealth: Payer: Self-pay | Admitting: Family Medicine

## 2019-11-15 MED ORDER — ATORVASTATIN CALCIUM 40 MG PO TABS: ORAL_TABLET | ORAL | 0 refills | Status: DC

## 2019-11-15 NOTE — Telephone Encounter (Signed)
Rx sent to the pharmacy.

## 2019-11-15 NOTE — Telephone Encounter (Signed)
Pt called in to request a refill for atorvastatin (LIPITOR) 40 MG tablet, pt says that he requested days ago with pharmacy. Pt says that he is now out of his medication.      Pharmacy:  [facility] [place], [place] AT [address] Phone:  [phone]  Fax:  [phone]

## 2019-12-28 ENCOUNTER — Ambulatory Visit: Payer: Medicare Other

## 2019-12-28 DIAGNOSIS — H18513 Endothelial corneal dystrophy, bilateral: Secondary | ICD-10-CM | POA: Diagnosis not present

## 2019-12-28 DIAGNOSIS — H401131 Primary open-angle glaucoma, bilateral, mild stage: Secondary | ICD-10-CM | POA: Diagnosis not present

## 2019-12-28 DIAGNOSIS — Z961 Presence of intraocular lens: Secondary | ICD-10-CM | POA: Diagnosis not present

## 2019-12-29 ENCOUNTER — Ambulatory Visit: Payer: Medicare Other

## 2020-01-06 ENCOUNTER — Ambulatory Visit: Payer: Federal, State, Local not specified - PPO | Attending: Internal Medicine

## 2020-01-06 DIAGNOSIS — Z23 Encounter for immunization: Secondary | ICD-10-CM

## 2020-01-06 NOTE — Progress Notes (Signed)
   Covid-19 Vaccination Clinic  Name:  JAQUANTE KIRSCHENMANN    MRN: [id number] DOB: May 13, 1936  01/06/2020  Mr. Sellars was observed post Covid-19 immunization for 15 minutes without incidence. He was provided with Vaccine Information Sheet and instruction to access the V-Safe system.   Mr. Kilkenny was instructed to call 911 with any severe reactions post vaccine: [name] Difficulty breathing  . Swelling of your face and throat  . A fast heartbeat  . A bad rash all over your body  . Dizziness and weakness    Immunizations Administered    Name Date Dose VIS Date Route   Pfizer COVID-19 Vaccine 01/06/2020  9:00 AM 0.3 mL 11/12/2019 Intramuscular   Manufacturer: [place]   Lot: [id number]   [place]: [id number]

## 2020-01-10 ENCOUNTER — Telehealth: Payer: Self-pay | Admitting: Family Medicine

## 2020-01-10 ENCOUNTER — Other Ambulatory Visit: Payer: Self-pay | Admitting: General Practice

## 2020-01-10 DIAGNOSIS — F4321 Adjustment disorder with depressed mood: Secondary | ICD-10-CM

## 2020-01-10 DIAGNOSIS — I2510 Atherosclerotic heart disease of native coronary artery without angina pectoris: Secondary | ICD-10-CM

## 2020-01-10 MED ORDER — SERTRALINE HCL 50 MG PO TABS: 50.0000 mg | ORAL_TABLET | Freq: Every day | ORAL | 0 refills | Status: DC

## 2020-01-10 MED ORDER — ATORVASTATIN CALCIUM 40 MG PO TABS: ORAL_TABLET | ORAL | 0 refills | Status: DC

## 2020-01-10 NOTE — Telephone Encounter (Signed)
Pt is requesting that is prescriptions now be sent to [facility] [address], [place], [place] [address].

## 2020-01-10 NOTE — Telephone Encounter (Signed)
These prescriptions were [facility] to [facility] [place] today.

## 2020-01-11 ENCOUNTER — Ambulatory Visit: Payer: Medicare HMO | Admitting: Cardiovascular Disease

## 2020-01-11 ENCOUNTER — Other Ambulatory Visit: Payer: Self-pay

## 2020-01-11 ENCOUNTER — Encounter: Payer: Self-pay | Admitting: Cardiovascular Disease

## 2020-01-11 VITALS — BP 138/62 | HR 55 | Ht 68.5 in | Wt 140.2 lb

## 2020-01-11 DIAGNOSIS — I10 Essential (primary) hypertension: Secondary | ICD-10-CM

## 2020-01-11 DIAGNOSIS — I2510 Atherosclerotic heart disease of native coronary artery without angina pectoris: Secondary | ICD-10-CM

## 2020-01-11 NOTE — Progress Notes (Signed)
[name] Date of Birth  1936-10-04         [address]    [place]    [place], [place]  [address]          Problem list: 1. Coronary artery disease- status post remote PTCA to the left complex artery in 1991, CABG 2014 2. Hypertension 3. Hyperlipidemia 4. Prostate Cancer.     Xion is  a 84 y.o. -year-old gentleman with a history of PTCA approximately 22 years ago.  He's not had any episodes of chest pain or shortness of breath. He stays fairly active although he doesn't exercise at the gym.  He has some problems with some arthritis-type pain in his right hip.  The Lipitor causes a little bit of muscular skeletal pain.  May 10, 2013:  He stopped hib Benicar 2 months ago. His BP has been running low at home.  He has been on a new medicine for his prostate and was concerned that his BP would run too low.   He has been taking a varied dose of benicar - 1 tab, 1/2 tab, 1/4 tablet whenever he thinks he needs it.   Sept. 5, 2014:  Aaric presents today for worsening shortness of breath and chest pain.  Last Saturday, he mowed the lawn and spread 200 lbs of lime.  He developed severe CP and started to take some NTG.  The pain finally eased up. Several days later, he had recurrent CP while cleaning out the gutters.    He thinks he was just overheated.   He has been recording his BP and has been getting some low readings.     He has been having some hip pain and does not know if he will be able to walk on the treadmill.   Sept. 24, 2014: Domingo was seen recently with some worsening chest pain. He had a stress Myoview study which was interpreted as low risk he has a fixed inferoapical defect.  He exertion, he is still having angina.  Lasts 5 minutes. In the center of his chest. No radiatioin.  Occurs only with exertion ( stating mower or Rototiller- never walking around the house.)   Class II - III angina.    Yesterday, he had some mild baseline CP which worsened while  walking around the grocery store.   Dec. 16, 2014:  Oslo has had CABG since I last saw him in the office.  Still has chest tenderness - breathing is good, no CP  Chest wall is still tender.  May 11, 2014:  Anoop is doing ok.   Jan. 4, 2015: Doing well  July 21, 2015:  Doing well from a cardiac standpoint  Still lonely ,  Mentally not where he needs to be since the death of his wife .   Visits with his 2 daughters frequently    Feb. 16, 2017:  Still grieving over the death of his wife , 16 months ago .   Were married 33 years.  Got some counseling [facility] but was not as effective as he would like    Aug. 16, 2017: No CP or dyspnea  Has easy bruising on his arms due to the ASA walks some on the treadmill  No CP   Feb. 12, 2018  Has had some visual issues. Had a head MRI yesterday .   No CP or dyspnea   Oct. 4, 2019:  Has been having more leg swelling recently .  Still eats some salts  Fixes his own meals .   Still eats some canned veggies  Is not more short of breath compared to his usual . No CP  Has been having swelling of his left lower leg   Aug. 7, 2020:  Doing well.   .  Little of exercise.   Treadmill 30 min a day  No CP or dyspnea with walking   Feb. 9, 2021:  Bettie is seen today . Has a hx of CAD and chronic combined CHF No CP .  Breathing is ok   Avoids salt .       Current Outpatient Medications on File Prior to Visit  Medication Sig Dispense Refill  . aspirin 81 MG EC tablet Take 1 tablet (81 mg total) by mouth daily.    [name] atorvastatin (LIPITOR) 40 MG tablet TAKE 1 TABLET(40 MG) BY MOUTH AT BEDTIME 90 tablet 0  . carvedilol (COREG) 12.5 MG tablet TAKE 1 TABLET(12.5 MG) BY MOUTH TWICE DAILY 180 tablet 2  . furosemide (LASIX) 20 MG tablet TAKE 1 TABLET(20 MG) BY MOUTH DAILY 90 tablet 2  . latanoprost (XALATAN) 0.005 % ophthalmic solution Place 1 drop into both eyes daily.     [name] losartan (COZAAR) 25 MG tablet Take 1 tablet (25 mg total)  by mouth daily. 90 tablet 3  . Multiple Vitamin (MULTIVITAMIN WITH MINERALS) TABS tablet Take 1 tablet by mouth daily.    . nitroGLYCERIN (NITROSTAT) 0.4 MG SL tablet PLACE 1 TABLET UNDER THE TONUE EVERY 5 MINUTES AS NEEDED FOR CHEST PAIN. 75 tablet 3  . polyethylene glycol (MIRALAX / GLYCOLAX) packet Take 17 g by mouth daily as needed (for constipation).     . sertraline (ZOLOFT) 50 MG tablet Take 1 tablet (50 mg total) by mouth daily. 90 tablet 0  . Turmeric (QC TUMERIC COMPLEX PO) Take 1 tablet by mouth daily.    . vitamin B-12 (CYANOCOBALAMIN) 100 MCG tablet Take 100 mcg by mouth daily.     No current facility-administered medications on file prior to visit.    Allergies  Allergen Reactions  . Bee Venom Other (See Comments)    Reaction unknown  . Amoxicillin Rash  . Hydrocodone Other (See Comments)    REACTION: disorientation    Past Medical History:  Diagnosis Date  . At risk for sleep apnea    STOP-BANG= 6    SENT TO PCP 05-25-2014  . Benign positional vertigo   . Bladder cancer ([place])   . Coronary artery disease CARDIOLOGIST--  DR Acie Fredrickson  . ED (erectile dysfunction)   . GERD (gastroesophageal reflux disease)   . H/O hiatal hernia   . History of anal fissures   . History of colon polyps   . History of radiation therapy 08/24/12-10/22/12   prostate 6600cGy/33 sessions--  for biochemical recurrence  . Hyperlipidemia   . Hypertension   . OA (osteoarthritis)   . Recurrent prostate adenocarcinoma ([place]) first dx 09/2005--  radial prostatectomy-- gleason 4+3=7, pT2c, negative margin   04/2012--  BIOCHEMICAL  RECURRENCE to 0.33 /  SALVAGE IMRT SEPT to  NOV 2013  . S/P CABG x 2    09/  2014  . S/P dilatation of esophageal stricture    1992  . Sigmoid diverticulosis   . SUI (stress urinary incontinence), male   . Urethral stricture   . Wears glasses     Past Surgical History:  Procedure Laterality Date  . CARDIAC CATHETERIZATION  08-27-2013  DR Martinique  CRITICAL OSTIAL  LM STENOSIS/  diffuse disease LAD 40-50%  &   pLCx 30-40%/  . CARDIOVASCULAR STRESS TEST  08-18-2013  DR NASHER   SMALL/ MEDIUM AREA MILD SCAR INFEROSEPTAL WALL/ NO ISCHEMIA/  LOW RISK SCAN/  EF 62%/  LV WALL MOTION WITH SLIGHT DECREASED OF THE SEPTUM  . CORONARY ANGIOPLASTY  1991   PTCA  of LEFT CFX  . CORONARY ARTERY BYPASS GRAFT N/A 08/27/2013   Procedure: CORONARY ARTERY BYPASS GRAFTING (CABG) times two using left internal mammary and right saphenous vein using endoscope.;  Surgeon: Melrose Nakayama, MD;  Location: [facility];  Service: Open Heart Surgery;  Laterality: N/A;  . CYSTOSCOPY WITH URETHRAL DILATATION N/A 01/26/2014   Procedure: CYSTOSCOPY WITH URETHRAL DILATATION, BLADDER BIOPSY, FOLEY CATHETER;  Surgeon: Molli Hazard, MD;  Location: [facility];  Service: Urology;  Laterality: N/A;  . INGUINAL HERNIA REPAIR Bilateral 1994  . KNEE ARTHROSCOPY Right   . LEFT HEART CATHETERIZATION WITH CORONARY ANGIOGRAM N/A 08/27/2013   Procedure: LEFT HEART CATHETERIZATION WITH CORONARY ANGIOGRAM;  Surgeon: Peter M Martinique, MD;  Location: [facility] CATH LAB;  Service: Cardiovascular;  Laterality: N/A;  . LUMBAR DISC SURGERY  1980'S  . RADICAL RETROPUBIC PROSTATECTOMY W/ BILATERAL PELVIC NODE DISSECTION  11-18-2005  . URETHROTOMY N/A 05/30/2014   Procedure: CYSTOSCOPY  BALLOON DILATION AND DIRECT VISION INTERNAL URETHROTOMY;  Surgeon: Sharyn Creamer, MD;  Location: [facility];  Service: Urology;  Laterality: N/A;    Social History   Tobacco Use  Smoking Status Former Smoker  . Packs/day: 2.00  . Years: 10.00  . Pack years: 20.00  . Types: Cigarettes  . Quit date: 12/03/1994  . Years since quitting: 25.1  Smokeless Tobacco Never Used    Social History   Substance and Sexual Activity  Alcohol Use Yes   Comment: occasionally    Family History  Problem Relation Age of Onset  . Cancer Father        prostate died 76 something  . Cancer Brother        prostate cancer  .  Arthritis Other   . Hyperlipidemia Other   . Hypertension Other     Reviw of Systems:    Physical Exam: Blood pressure 138/62, pulse (!) 55, height 5' 8.5" (1.74 m), weight 140 lb 3.2 oz (63.6 kg), SpO2 98 %.  GEN:  Well nourished, well developed in no acute distress HEENT: Normal NECK: No JVD; No carotid bruits LYMPHATICS: No lymphadenopathy CARDIAC: RRR , no murmurs, rubs, gallops RESPIRATORY:  Clear to auscultation without rales, wheezing or rhonchi  ABDOMEN: Soft, non-tender, non-distended MUSCULOSKELETAL:  No edema; No deformity  SKIN: Warm and dry NEUROLOGIC:  Alert and oriented x 3   ECG:    Assessment / Plan:   1. Coronary artery disease-  No angina , seems to be doing well.  2. Hypertension -blood pressure is well controlled.  Continue current medications.  3. Hyperlipidemia-lipid levels look good.  He asked him checked at his primary medical doctor's office.  Continue atorvastatin.     4. Prostate Cancer.  Stable, sees urology   5. Abdominal aortic aneurism  -  Gets periodic AAA duplex scans by Dr. Elease Hashimoto   6.  Chronic combined congestive heart failure:   Stable .     Mertie Moores, MD  01/11/2020 2:59 PM    [address],  [place], [place]  [address] Pager [phone] Phone: [phone]; Fax: [phone]      

## 2020-01-11 NOTE — Patient Instructions (Signed)

## 2020-01-19 DIAGNOSIS — R69 Illness, unspecified: Secondary | ICD-10-CM | POA: Diagnosis not present

## 2020-01-23 ENCOUNTER — Other Ambulatory Visit: Payer: Self-pay | Admitting: Family Medicine

## 2020-01-23 DIAGNOSIS — I2510 Atherosclerotic heart disease of native coronary artery without angina pectoris: Secondary | ICD-10-CM

## 2020-01-24 DIAGNOSIS — R69 Illness, unspecified: Secondary | ICD-10-CM | POA: Diagnosis not present

## 2020-01-26 DIAGNOSIS — R69 Illness, unspecified: Secondary | ICD-10-CM | POA: Diagnosis not present

## 2020-01-31 ENCOUNTER — Ambulatory Visit: Payer: Medicare HMO | Attending: Internal Medicine

## 2020-01-31 DIAGNOSIS — Z23 Encounter for immunization: Secondary | ICD-10-CM

## 2020-01-31 NOTE — Progress Notes (Signed)
   Covid-19 Vaccination Clinic  Name:  PANTH HIEB    MRN: [id number] DOB: 1936-10-29  01/31/2020  Mr. Aldridge was observed post Covid-19 immunization for 15 minutes without incidence. He was provided with Vaccine Information Sheet and instruction to access the V-Safe system.   Mr. [place]ra[place] was instructed to call 911 with any severe reactions post vaccine: [name] Difficulty breathing  . Swelling of your face and throat  . A fast heartbeat  . A bad rash all over your body  . Dizziness and weakness    Immunizations Administered    Name Date Dose VIS Date Route   Pfizer COVID-19 Vaccine 01/31/2020  1:16 PM 0.3 mL 11/12/2019 Intramuscular   Manufacturer: [place]   Lot: [id number]   [place]: [id number]

## 2020-02-01 ENCOUNTER — Encounter: Payer: Self-pay | Admitting: Family Medicine

## 2020-02-01 ENCOUNTER — Other Ambulatory Visit: Payer: Self-pay

## 2020-02-01 ENCOUNTER — Ambulatory Visit (INDEPENDENT_AMBULATORY_CARE_PROVIDER_SITE_OTHER): Payer: Medicare HMO | Admitting: Family Medicine

## 2020-02-01 VITALS — BP 126/74 | HR 78 | Temp 97.5°F | Ht 68.5 in | Wt 141.1 lb

## 2020-02-01 DIAGNOSIS — S76312A Strain of muscle, fascia and tendon of the posterior muscle group at thigh level, left thigh, initial encounter: Secondary | ICD-10-CM | POA: Diagnosis not present

## 2020-02-01 DIAGNOSIS — I10 Essential (primary) hypertension: Secondary | ICD-10-CM

## 2020-02-01 DIAGNOSIS — N183 Chronic kidney disease, stage 3 unspecified: Secondary | ICD-10-CM

## 2020-02-01 DIAGNOSIS — E785 Hyperlipidemia, unspecified: Secondary | ICD-10-CM

## 2020-02-01 LAB — LIPID PANEL
Cholesterol: 108 mg/dL (ref 0–200)
HDL: 42.8 mg/dL (ref >39.00)
LDL Cholesterol: 42 mg/dL (ref 0–99)
NonHDL: 64.72
Total CHOL/HDL Ratio: 3
Triglycerides: 116 mg/dL (ref 0.0–149.0)
VLDL: 23.2 mg/dL (ref 0.0–40.0)

## 2020-02-01 LAB — BASIC METABOLIC PANEL
BUN: 23 mg/dL (ref 6–23)
CO2: 31 mEq/L (ref 19–32)
Calcium: 9.8 mg/dL (ref 8.4–10.5)
Chloride: 104 mEq/L (ref 96–112)
Creatinine, Ser: 1.43 mg/dL (ref 0.40–1.50)
GFR: 47.34 mL/min — ABNORMAL LOW (ref >60.00)
Glucose, Bld: 91 mg/dL (ref 70–99)
Potassium: 5 mEq/L (ref 3.5–5.1)
Sodium: 140 mEq/L (ref 135–145)

## 2020-02-01 LAB — HEPATIC FUNCTION PANEL
ALT: 21 U/L (ref 0–53)
AST: 21 U/L (ref 0–37)
Albumin: 4.1 g/dL (ref 3.5–5.2)
Alkaline Phosphatase: 67 U/L (ref 39–117)
Bilirubin, Direct: 0.2 mg/dL (ref 0.0–0.3)
Total Bilirubin: 1 mg/dL (ref 0.2–1.2)
Total Protein: 6.9 g/dL (ref 6.0–8.3)
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
## 2020-02-01 NOTE — Patient Instructions (Addendum)
Hamstring Strain  A hamstring strain happens when the muscles in the back of the thighs (hamstring muscles) are overstretched or torn. The hamstring muscles are used in straightening the hips, bending the knees, and pulling back the legs. This injury is often called a pulled hamstring muscle. The tissue that connects the muscle to a bone (tendon) may also be affected. The severity of a hamstring strain may be rated in degrees or grades. First-degree (or grade 1) strains have the least amount of muscle tearing and pain. Second-degree and third-degree (grade 2 and 3) strains have increasingly more tearing and pain. What are the causes? This condition is caused by a sudden, violent force being placed on the hamstring muscles, stretching them too far. This often happens during activities that involve running, jumping, kicking, or weight lifting. What increases the risk? Hamstring strains are especially common in athletes. The following factors may also make you more likely to develop this condition:  Having low strength, endurance, or flexibility of the hamstring muscles.  Doing high-impact physical activity or sports.  Having poor physical fitness.  Having a previous leg injury.  Having tired (fatigued) muscles. What are the signs or symptoms? Symptoms of this condition include:  Pain in the back of the thigh.  Swelling.  Bruising.  Muscle spasms.  Trouble moving the affected muscle because of pain. For severe strains, you may feel popping or snapping in the back of your thigh when the injury occurs. How is this diagnosed? This condition is diagnosed based on your symptoms, your medical history, and a physical exam. How is this treated? Treatment for this condition usually involves:  Protecting, resting, icing, applying compression, and elevating the injured area (PRICE therapy).  Medicines. Your health care provider may recommend medicines to help reduce pain or inflammation.   Doing exercises to regain strength and flexibility in the muscles. Your health care provider will tell you when it is okay to begin exercising. Follow these instructions at home: PRICE therapy Use PRICE therapy to promote muscle healing during the first 2-3 days after your injury, or as told by your health care provider.  Protect the muscle from being injured again.  Rest your injury. This usually involves limiting your normal activities and not using the injured hamstring muscle. Talk with your health care provider about how you should limit your activities.  Apply ice to the injured area: ? Put ice in a plastic bag. ? Place a towel between your skin and the bag. ? Leave the ice on for 20 minutes, 2-3 times a day. After the third day, switch to applying heat as told.  Put pressure (compression) on your injured hamstring by wrapping it with an elastic bandage. Be careful not to wrap it too tightly. That may interfere with blood circulation or may increase swelling.  Raise (elevate) your injured hamstring above the level of your heart as often as possible. When you are lying down, you can do this by putting a pillow under your thigh.  Activity  Begin exercising or stretching only as told by your health care provider.  Do not return to full activity level until your health care provider approves.  To help prevent muscle strains in the future, always warm up before exercising and stretch afterward. General instructions  Take over-the-counter and prescription medicines only as told by your health care provider.  If directed, apply heat to the affected area as often as told by your health care provider. Use the heat source that your  health care provider recommends, such as a moist heat pack or a heating pad. ? Place a towel between your skin and the heat source. ? Leave the heat on for 20-30 minutes. ? Remove the heat if your skin turns bright red. This is especially important if you are  unable to feel pain, heat, or cold. You may have a greater risk of getting burned.  Keep all follow-up visits as told by your health care provider. This is important. Contact a health care provider if you have:  Increasing pain or swelling in the injured area.  Numbness, tingling, or a significant loss of strength in the injured area. Get help right away if:  Your foot or your toes become cold or turn blue. Summary  A hamstring strain happens when the muscles in the back of the thighs (hamstring muscles) are overstretched or torn.  This injury can be caused by a sudden, violent force being placed on the hamstring muscles, causing them to stretch too far.  Symptoms include pain, swelling, and muscle spasms in the injured area.  Treatment includes what is called PRICE therapy: protecting, resting, icing, applying compression, and elevating the injured area. This information is not intended to replace advice given to you by your health care provider. Make sure you discuss any questions you have with your health care provider. Document Revised: 10/31/2017 Document Reviewed: 10/16/2017 Elsevier Patient Education  [place].  Consider over the counter Aleve twice daily as needed for inflammation.

## 2020-03-22 ENCOUNTER — Encounter: Payer: Self-pay | Admitting: Family Medicine

## 2020-03-22 ENCOUNTER — Other Ambulatory Visit: Payer: Self-pay

## 2020-03-22 ENCOUNTER — Ambulatory Visit (INDEPENDENT_AMBULATORY_CARE_PROVIDER_SITE_OTHER): Payer: Medicare HMO | Admitting: Family Medicine

## 2020-03-22 VITALS — BP 122/74 | HR 88 | Temp 98.1°F | Wt 140.2 lb

## 2020-03-22 DIAGNOSIS — S7012XA Contusion of left thigh, initial encounter: Secondary | ICD-10-CM
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
## 2020-03-22 NOTE — Patient Instructions (Signed)
Hematoma A hematoma is a collection of blood under the skin, in an organ, in a body space, in a joint space, or in other tissue. The blood can thicken (clot) to form a lump that you can see and feel. The lump is often firm and may become sore and tender. Most hematomas get better in a few days to weeks. However, some hematomas may be serious and require medical care. Hematomas can range from very small to very large. What are the causes? This condition is caused by:  A blunt or penetrating injury.  A leakage from a blood vessel under the skin.  Some medical procedures, including surgeries, such as oral surgery, face lifts, and surgeries on the joints.  Some medical conditions that cause bleeding or bruising. There may be multiple hematomas that appear in different areas of the body. What increases the risk? You are more likely to develop this condition if:  You are an older adult.  You use blood thinners. What are the signs or symptoms?  Symptoms of this condition depend on where the hematoma is located.  Common symptoms of a hematoma that is under the skin include:  A firm lump on the body.  Pain and tenderness in the area.  Bruising. Blue, dark blue, purple-red, or yellowish skin (discoloration) may appear at the site of the hematoma if the hematoma is close to the surface of the skin. Common symptoms of a hematoma that is deep in the tissues or body spaces may be less obvious. They include:  A collection of blood in the stomach (intra-abdominal hematoma). This may cause pain in the abdomen, weakness, fainting, and shortness of breath.  A collection of blood in the head (intracranial hematoma). This may cause a headache or symptoms such as weakness, trouble speaking or understanding, or a change in consciousness. How is this diagnosed? This condition is diagnosed based on:  Your medical history.  A physical exam.  Imaging tests, such as an ultrasound or CT scan. These may  be needed if your health care provider suspects a hematoma in deeper tissues or body spaces.  Blood tests. These may be needed if your health care provider believes that the hematoma is caused by a medical condition. How is this treated? Treatment for this condition depends on the cause, size, and location of the hematoma. Treatment may include:  Doing nothing. The majority of hematomas do not need treatment as many of them go away on their own over time.  Surgery or close monitoring. This may be needed for large hematomas or hematomas that affect vital organs.  Medicines. Medicines may be given if there is an underlying medical cause for the hematoma. Follow these instructions at home: Managing pain, stiffness, and swelling   If directed, put ice on the affected area. ? Put ice in a plastic bag. ? Place a towel between your skin and the bag. ? Leave the ice on for 20 minutes, 2-3 times a day for the first couple of days.  If directed, apply heat to the affected area after applying ice for a couple of days. Use the heat source that your health care provider recommends, such as a moist heat pack or a heating pad. ? Place a towel between your skin and the heat source. ? Leave the heat on for 20-30 minutes. ? Remove the heat if your skin turns bright red. This is especially important if you are unable to feel pain, heat, or cold. You may have a greater   risk of getting burned.  Raise (elevate) the affected area above the level of your heart while you are sitting or lying down.  If told, wrap the affected area with an elastic bandage. The bandage applies pressure (compression) to the area, which may help to reduce swelling and promote healing. Do not wrap the bandage too tightly around the affected area.  If your hematoma is on a leg or foot (lower extremity) and is painful, your health care provider may recommend crutches. Use them as told by your health care provider. General  instructions  Take over-the-counter and prescription medicines only as told by your health care provider.  Keep all follow-up visits as told by your health care provider. This is important. Contact a health care provider if:  You have a fever.  The swelling or discoloration gets worse.  You develop more hematomas. Get help right away if:  Your pain is worse or your pain is not controlled with medicine.  Your skin over the hematoma breaks or starts bleeding.  Your hematoma is in your chest or abdomen and you have weakness, shortness of breath, or a change in consciousness.  You have a hematoma on your scalp that is caused by a fall or injury, and you also have: ? A headache that gets worse. ? Trouble speaking or understanding speech. ? Weakness. ? Change in alertness or consciousness. Summary  A hematoma is a collection of blood under the skin, in an organ, in a body space, in a joint space, or in other tissue.  This condition usually does not need treatment because many hematomas go away on their own over time.  Large hematomas, or those that may affect vital organs, may need surgical drainage or monitoring. If the hematoma is caused by a medical condition, medicines may be prescribed.  Get help right away if your hematoma breaks or starts to bleed, you have shortness of breath, or you have a headache or trouble speaking after a fall. This information is not intended to replace advice given to you by your health care provider. Make sure you discuss any questions you have with your health care provider. Document Revised: 04/14/2019 Document Reviewed: 04/23/2018 Elsevier Patient Education  2020 Elsevier Inc.  

## 2020-04-04 ENCOUNTER — Other Ambulatory Visit: Payer: Self-pay | Admitting: Family Medicine

## 2020-04-04 ENCOUNTER — Other Ambulatory Visit: Payer: Self-pay | Admitting: Cardiovascular Disease

## 2020-04-04 DIAGNOSIS — H409 Unspecified glaucoma: Secondary | ICD-10-CM | POA: Diagnosis not present

## 2020-04-04 DIAGNOSIS — Z8546 Personal history of malignant neoplasm of prostate: Secondary | ICD-10-CM | POA: Diagnosis not present

## 2020-04-04 DIAGNOSIS — F4321 Adjustment disorder with depressed mood: Secondary | ICD-10-CM

## 2020-04-04 DIAGNOSIS — R609 Edema, unspecified: Secondary | ICD-10-CM | POA: Diagnosis not present

## 2020-04-04 DIAGNOSIS — F419 Anxiety disorder, unspecified: Secondary | ICD-10-CM | POA: Diagnosis not present

## 2020-04-04 DIAGNOSIS — Z7982 Long term (current) use of aspirin: Secondary | ICD-10-CM | POA: Diagnosis not present

## 2020-04-04 DIAGNOSIS — I10 Essential (primary) hypertension: Secondary | ICD-10-CM | POA: Diagnosis not present

## 2020-04-04 DIAGNOSIS — E785 Hyperlipidemia, unspecified: Secondary | ICD-10-CM | POA: Diagnosis not present

## 2020-04-04 DIAGNOSIS — Z833 Family history of diabetes mellitus: Secondary | ICD-10-CM | POA: Diagnosis not present

## 2020-04-04 DIAGNOSIS — R69 Illness, unspecified: Secondary | ICD-10-CM | POA: Diagnosis not present

## 2020-04-04 DIAGNOSIS — I2510 Atherosclerotic heart disease of native coronary artery without angina pectoris: Secondary | ICD-10-CM | POA: Diagnosis not present

## 2020-04-24 ENCOUNTER — Other Ambulatory Visit: Payer: Self-pay | Admitting: Cardiovascular Disease

## 2020-04-25 DIAGNOSIS — H401131 Primary open-angle glaucoma, bilateral, mild stage: Secondary | ICD-10-CM | POA: Diagnosis not present

## 2020-04-25 DIAGNOSIS — Z961 Presence of intraocular lens: Secondary | ICD-10-CM | POA: Diagnosis not present

## 2020-04-25 DIAGNOSIS — H18513 Endothelial corneal dystrophy, bilateral: Secondary | ICD-10-CM | POA: Diagnosis not present

## 2020-06-26 DIAGNOSIS — R2232 Localized swelling, mass and lump, left upper limb: Secondary | ICD-10-CM | POA: Diagnosis not present

## 2020-06-26 DIAGNOSIS — T63441A Toxic effect of venom of bees, accidental (unintentional), initial encounter: Secondary | ICD-10-CM | POA: Diagnosis not present

## 2020-07-03 ENCOUNTER — Other Ambulatory Visit: Payer: Self-pay | Admitting: Family Medicine

## 2020-07-03 DIAGNOSIS — I2510 Atherosclerotic heart disease of native coronary artery without angina pectoris: Secondary | ICD-10-CM

## 2020-07-10 ENCOUNTER — Ambulatory Visit (HOSPITAL_COMMUNITY)
Admission: RE | Admit: 2020-07-10 | Discharge: 2020-07-10 | Disposition: A | Discharge status: Home / Self Care | Payer: Medicare HMO | Source: Ambulatory Visit | Attending: Cardiology | Admitting: Cardiology

## 2020-07-10 ENCOUNTER — Other Ambulatory Visit: Payer: Self-pay

## 2020-07-10 DIAGNOSIS — I714 Abdominal aortic aneurysm, without rupture, unspecified: Secondary | ICD-10-CM

## 2020-07-25 DIAGNOSIS — R69 Illness, unspecified: Secondary | ICD-10-CM | POA: Diagnosis not present

## 2020-07-31 ENCOUNTER — Encounter: Payer: Self-pay | Admitting: Cardiovascular Disease

## 2020-07-31 ENCOUNTER — Ambulatory Visit: Payer: Medicare HMO | Admitting: Cardiovascular Disease

## 2020-07-31 ENCOUNTER — Other Ambulatory Visit: Payer: Self-pay

## 2020-07-31 VITALS — BP 122/62 | HR 54 | Ht 68.5 in | Wt 137.8 lb

## 2020-07-31 DIAGNOSIS — I10 Essential (primary) hypertension: Secondary | ICD-10-CM | POA: Diagnosis not present

## 2020-07-31 DIAGNOSIS — I714 Abdominal aortic aneurysm, without rupture, unspecified: Secondary | ICD-10-CM

## 2020-07-31 DIAGNOSIS — I5022 Chronic systolic (congestive) heart failure: Secondary | ICD-10-CM

## 2020-07-31 DIAGNOSIS — I2510 Atherosclerotic heart disease of native coronary artery without angina pectoris: Secondary | ICD-10-CM

## 2020-07-31 MED ORDER — FUROSEMIDE 20 MG PO TABS
20.0000 mg | ORAL_TABLET | ORAL | 3 refills | Status: DC
Start: 2020-07-31 — End: 2021-08-01

## 2020-07-31 NOTE — Patient Instructions (Addendum)
Medication Instructions:  Your physician has recommended you make the following change in your medication:   DECREASE Furosemide (Lasix) to 20 mg on Monday, Wednesday, Friday only  *If you need a refill on your cardiac medications before your next appointment, please call your pharmacy*   Lab Work: Your physician recommends that you return for lab work in: 3 weeks for basic metabolic panel on Tuesday Sept. 21 You do not have to fast for this appointment and can come in anytime on this day between the hours of 7:30 am and 4:45 pm  Your physician recommends that you return for lab work in: 6 months on the day of or a few days before your office visit with Dr. Acie Fredrickson.  You will need to FAST for this appointment - nothing to eat or drink after midnight the night before except water.  If you have labs (blood work) drawn today and your tests are completely normal, you will receive your results only by: [name] MyChart Message (if you have MyChart) OR . A paper copy in the mail If you have any lab test that is abnormal or we need to change your treatment, we will call you to review the results.   Testing/Procedures: None Ordered   Follow-Up: You have been referred to [facility] for management of your AAA   At [facility], you and your health needs are our priority.  As part of our continuing mission to provide you with exceptional heart care, we have created designated Provider Care Teams.  These Care Teams include your primary Cardiologist (physician) and Advanced Practice Providers (APPs -  Physician Assistants and Nurse Practitioners) who all work together to provide you with the care you need, when you need it.   Your next appointment:   6 month(s)  The format for your next appointment:   In Person  Provider:   You may see Mertie Moores, MD or one of the following Advanced Practice Providers on your designated Care Team:    Richardson Dopp, PA-C  [place],  [place]

## 2020-07-31 NOTE — Progress Notes (Signed)
[name] Date of Birth  06/05/1936         [address]    [place]    [place], [place]  [address]          Problem list: 1. Coronary artery disease- status post remote PTCA to the left complex artery in 1991, CABG 2014 2. Hypertension 3. Hyperlipidemia 4. Prostate Cancer.     [name] is  a 84 y.o. -year-old gentleman with a history of PTCA approximately 22 years ago.  He's not had any episodes of chest pain or shortness of breath. He stays fairly active although he doesn't exercise at the gym.  He has some problems with some arthritis-type pain in his right hip.  The Lipitor causes a little bit of muscular skeletal pain.  May 10, 2013:  He stopped hib Benicar 2 months ago. His BP has been running low at home.  He has been on a new medicine for his prostate and was concerned that his BP would run too low.   He has been taking a varied dose of benicar - 1 tab, 1/2 tab, 1/4 tablet whenever he thinks he needs it.   Sept. 5, 2014:  [name] presents today for worsening shortness of breath and chest pain.  Last Saturday, he mowed the lawn and spread 200 lbs of lime.  He developed severe CP and started to take some NTG.  The pain finally eased up. Several days later, he had recurrent CP while cleaning out the gutters.    He thinks he was just overheated.   He has been recording his BP and has been getting some low readings.     He has been having some hip pain and does not know if he will be able to walk on the treadmill.   Sept. 24, 2014: [name] was seen recently with some worsening chest pain. He had a stress Myoview study which was interpreted as low risk he has a fixed inferoapical defect.  He exertion, he is still having angina.  Lasts 5 minutes. In the center of his chest. No radiatioin.  Occurs only with exertion ( stating mower or Rototiller- never walking around the house.)   Class II - III angina.    Yesterday, he had some mild baseline CP which worsened while  walking around the grocery store.   Dec. 16, 2014:  [name] has had CABG since I last saw him in the office.  Still has chest tenderness - breathing is good, no CP  Chest wall is still tender.  May 11, 2014:  [name] is doing ok.   Jan. 4, 2015: Doing well  July 21, 2015:  Doing well from a cardiac standpoint  Still lonely ,  Mentally not where he needs to be since the death of his wife .   Visits with his 2 daughters frequently    Feb. 16, 2017:  Still grieving over the death of his wife , 16 months ago .   Were married 55 years.  Got some counseling at [facility] but was not as effective as he would like    Aug. 16, 2017: No CP or dyspnea  Has easy bruising on his arms due to the ASA walks some on the treadmill  No CP   Feb. 12, 2018  Has had some visual issues. Had a head MRI yesterday .   No CP or dyspnea   Oct. 4, 2019:  Has been having more leg swelling recently .  Still eats some salts  Fixes his own meals .   Still eats some canned veggies  Is not more short of breath compared to his usual . No CP  Has been having swelling of his left lower leg   Aug. 7, 2020:  Doing well.   .  Little of exercise.   Treadmill 30 min a day  No CP or dyspnea with walking   Feb. 9, 2021:  [name] is seen today . Has a hx of CAD and chronic combined CHF No CP .  Breathing is ok   Avoids salt .     07/31/2020:  No cardiac complains Has leg cramps at night  Has occasional low BP readings .  Occasionally associated with some dizziness.  No CP .     Current Outpatient Medications on File Prior to Visit  Medication Sig Dispense Refill  . aspirin 81 MG EC tablet Take 1 tablet (81 mg total) by mouth daily.    [name] atorvastatin (LIPITOR) 40 MG tablet TAKE 1 TABLET(40 MG) BY MOUTH AT BEDTIME 90 tablet 0  . betamethasone dipropionate 0.05 % cream Apply 1 application topically 2 (two) times daily.    . carvedilol (COREG) 12.5 MG tablet TAKE 1 TABLET BY MOUTH TWICE A DAY 180  tablet 2  . furosemide (LASIX) 20 MG tablet TAKE 1 TABLET BY MOUTH EVERY DAY 90 tablet 3  . latanoprost (XALATAN) 0.005 % ophthalmic solution Place 1 drop into both eyes daily.     [name] losartan (COZAAR) 25 MG tablet TAKE 1 TABLET BY MOUTH EVERY DAY 90 tablet 3  . Multiple Vitamin (MULTIVITAMIN WITH MINERALS) TABS tablet Take 1 tablet by mouth daily.    . nitroGLYCERIN (NITROSTAT) 0.4 MG SL tablet PLACE 1 TABLET UNDER THE TONUE EVERY 5 MINUTES AS NEEDED FOR CHEST PAIN. 75 tablet 3  . polyethylene glycol (MIRALAX / GLYCOLAX) packet Take 17 g by mouth daily as needed (for constipation).     . sertraline (ZOLOFT) 50 MG tablet TAKE 1 TABLET BY MOUTH EVERY DAY 90 tablet 0  . Turmeric (QC TUMERIC COMPLEX PO) Take 1 tablet by mouth daily.    . vitamin B-12 (CYANOCOBALAMIN) 100 MCG tablet Take 100 mcg by mouth daily.     No current facility-administered medications on file prior to visit.    Allergies  Allergen Reactions  . Bee Venom Other (See Comments)    Reaction unknown  . Amoxicillin Rash  . Hydrocodone Other (See Comments)    REACTION: disorientation    Past Medical History:  Diagnosis Date  . At risk for sleep apnea    STOP-BANG= 6    SENT TO PCP 05-25-2014  . Benign positional vertigo   . Bladder cancer ([place])   . Coronary artery disease CARDIOLOGIST--  DR Acie Fredrickson  . ED (erectile dysfunction)   . GERD (gastroesophageal reflux disease)   . H/O hiatal hernia   . History of anal fissures   . History of colon polyps   . History of radiation therapy 08/24/12-10/22/12   prostate 6600cGy/33 sessions--  for biochemical recurrence  . Hyperlipidemia   . Hypertension   . OA (osteoarthritis)   . Recurrent prostate adenocarcinoma ([place]) first dx 09/2005--  radial prostatectomy-- gleason 4+3=7, pT2c, negative margin   04/2012--  BIOCHEMICAL  RECURRENCE to 0.33 /  SALVAGE IMRT SEPT to  NOV 2013  . S/P CABG x 2    09/  2014  . S/P dilatation of esophageal stricture    1992  .  Sigmoid  diverticulosis   . SUI (stress urinary incontinence), male   . Urethral stricture   . Wears glasses     Past Surgical History:  Procedure Laterality Date  . CARDIAC CATHETERIZATION  08-27-2013  DR Martinique   CRITICAL OSTIAL LM STENOSIS/  diffuse disease LAD 40-50%  &   pLCx 30-40%/  . CARDIOVASCULAR STRESS TEST  08-18-2013  DR NASHER   SMALL/ MEDIUM AREA MILD SCAR INFEROSEPTAL WALL/ NO ISCHEMIA/  LOW RISK SCAN/  EF 62%/  LV WALL MOTION WITH SLIGHT DECREASED OF THE SEPTUM  . CORONARY ANGIOPLASTY  1991   PTCA  of LEFT CFX  . CORONARY ARTERY BYPASS GRAFT N/A 08/27/2013   Procedure: CORONARY ARTERY BYPASS GRAFTING (CABG) times two using left internal mammary and right saphenous vein using endoscope.;  Surgeon: Melrose Nakayama, MD;  Location: [place];  Service: Open Heart Surgery;  Laterality: N/A;  . CYSTOSCOPY WITH URETHRAL DILATATION N/A 01/26/2014   Procedure: CYSTOSCOPY WITH URETHRAL DILATATION, BLADDER BIOPSY, FOLEY CATHETER;  Surgeon: Molli Hazard, MD;  [facility];  Service: Urology;  Laterality: N/A;  . INGUINAL HERNIA REPAIR Bilateral 1994  . KNEE ARTHROSCOPY Right   . LEFT HEART CATHETERIZATION WITH CORONARY ANGIOGRAM N/A 08/27/2013   Procedure: LEFT HEART CATHETERIZATION WITH CORONARY ANGIOGRAM;  Surgeon: Peter M Martinique, MD;  Location[facility];  Service: Cardiovascular;  Laterality: N/A;  . LUMBAR DISC SURGERY  1980'S  . RADICAL RETROPUBIC PROSTATECTOMY W/ BILATERAL PELVIC NODE DISSECTION  11-18-2005  . URETHROTOMY N/A 05/30/2014   Procedure: CYSTOSCOPY  BALLOON DILATION AND DIRECT VISION INTERNAL URETHROTOMY;  Surgeon: Sharyn Creamer, MD;  Location: [facility];  Service: Urology;  Laterality: N/A;    Social History   Tobacco Use  Smoking Status Former Smoker  . Packs/day: 2.00  . Years: 10.00  . Pack years: 20.00  . Types: Cigarettes  . Quit date: 12/03/1994  . Years since quitting: 25.6  Smokeless Tobacco Never Used    Social History    Substance and Sexual Activity  Alcohol Use Yes   Comment: occasionally    Family History  Problem Relation Age of Onset  . Cancer Father        prostate died 58 something  . Cancer Brother        prostate cancer  . Arthritis Other   . Hyperlipidemia Other   . Hypertension Other     Reviw of Systems:   Physical Exam: Blood pressure 122/62, pulse (!) 54, height 5' 8.5" (1.74 m), weight 137 lb 12.8 oz (62.5 kg), SpO2 97 %.  GEN:  Well nourished, well developed in no acute distress HEENT: Normal NECK: No JVD; No carotid bruits LYMPHATICS: No lymphadenopathy CARDIAC: RRR , no murmurs, rubs, gallops RESPIRATORY:  Clear to auscultation without rales, wheezing or rhonchi  ABDOMEN: Soft, non-tender, non-distended MUSCULOSKELETAL:  No edema; No deformity  SKIN: Warm and dry NEUROLOGIC:  Alert and oriented x 3   ECG:    Aug. 30, 2021:  Sinus brady at 54.  No ST or T wave changes.   Assessment / Plan:   1. Coronary artery disease-  No angina ,   Cont current meds.     2. Hypertension :  BP is a bit low at times.  I suspect this is volume depletion  3. Hyperlipidemia-  Lipids, liver , bmp in 6 months    4. Prostate Cancer.     5. Abdominal aortic aneurism  - hi sAAA has  grown.  No measuses 4.2 cm.   Will refer him to [facility] to be followed    6.  Chronic combined congestive heart failure:      No signs of symptoms of worseining CHF .    Bruce Moores, MD  07/31/2020 11:43 AM    [facility] [place],  [address] [place], [place]  [address] Pager [phone] Phone: [phone]; Fax: [phone]

## 2020-08-02 ENCOUNTER — Telehealth: Payer: Self-pay | Admitting: Cardiovascular Disease

## 2020-08-02 NOTE — Telephone Encounter (Signed)
Adain is calling stating that [facility] does not want to fool with his leg. He is requesting to speak with a nurse in regards to it. Please advise.

## 2020-08-02 NOTE — Telephone Encounter (Signed)
I spoke with patient.  He thought referral to [facility] was for his hip pain.  I explained to patient this was for his AAA.  Patient found his AVS from recent visit and saw notes regarding referral in his instructions.  He asked me to contact [facility] and let them know he would like to proceed with appointment for evaluation of AAA.  I spoke with [facility] and confirmed they have referral and will contact patient. Patient reports when he had AAA study done the technician mentioned possible narrowing in vein in his right leg.  Patient reports he has pain in right hip. He is not sure if this is arthritis or something else.  Pain occurs with activity such as walking on the treadmill. Improves when he rests. Will forward to Dr Acie Fredrickson to see if additional testing needed for hip pain

## 2020-08-03 ENCOUNTER — Other Ambulatory Visit: Payer: Self-pay | Admitting: Family Medicine

## 2020-08-03 DIAGNOSIS — I2510 Atherosclerotic heart disease of native coronary artery without angina pectoris: Secondary | ICD-10-CM

## 2020-08-03 DIAGNOSIS — F4321 Adjustment disorder with depressed mood: Secondary | ICD-10-CM

## 2020-08-03 DIAGNOSIS — Z23 Encounter for immunization: Secondary | ICD-10-CM | POA: Diagnosis not present

## 2020-08-03 NOTE — Telephone Encounter (Signed)
[facility] will assess his aorta and any PAD.   Will leave further testing up to [facility].

## 2020-08-03 NOTE — Telephone Encounter (Signed)
Patient notified

## 2020-08-09 ENCOUNTER — Other Ambulatory Visit: Payer: Self-pay

## 2020-08-09 ENCOUNTER — Telehealth (INDEPENDENT_AMBULATORY_CARE_PROVIDER_SITE_OTHER): Payer: Medicare HMO | Admitting: Family Medicine

## 2020-08-09 ENCOUNTER — Telehealth: Payer: Self-pay | Admitting: Family Medicine

## 2020-08-09 DIAGNOSIS — R69 Illness, unspecified: Secondary | ICD-10-CM | POA: Diagnosis not present

## 2020-08-09 DIAGNOSIS — I2510 Atherosclerotic heart disease of native coronary artery without angina pectoris: Secondary | ICD-10-CM | POA: Diagnosis not present

## 2020-08-09 DIAGNOSIS — F4321 Adjustment disorder with depressed mood: Secondary | ICD-10-CM

## 2020-08-09 DIAGNOSIS — E785 Hyperlipidemia, unspecified: Secondary | ICD-10-CM | POA: Diagnosis not present

## 2020-08-09 MED ORDER — SERTRALINE HCL 50 MG PO TABS: 50.0000 mg | ORAL_TABLET | Freq: Every day | ORAL | 3 refills | Status: DC

## 2020-08-09 MED ORDER — ATORVASTATIN CALCIUM 40 MG PO TABS: ORAL_TABLET | ORAL | 3 refills | Status: DC

## 2020-08-09 NOTE — Progress Notes (Signed)
Patient ID: [name], male   DOB: [birth year], 84 y.o.   MRN: [id number]   This visit type was conducted due to national recommendations for restrictions regarding the COVID-19 pandemic in an effort to limit this patient's exposure and mitigate transmission in our community.   Virtual Visit via Telephone Note  I connected with [name] on 08/09/20 at  4:45 PM EDT by telephone and verified that I am speaking with the correct person using two identifiers.   I discussed the limitations, risks, security and privacy concerns of performing an evaluation and management service by telephone and the availability of in person appointments. I also discussed with the patient that there may be a patient responsible charge related to this service. The patient expressed understanding and agreed to proceed.  Location patient: home Location provider: work or home office Participants present for the call: patient, provider Patient did not have a visit in the prior 7 days to address this/these issue(s).   History of Present Illness: [name] he called requesting couple of refills.  He is on Sertraline which he has taken for the past few years following death of his wife.  He still has some sad mood at times but overall feels improved and has felt much better with taking the sertraline.  Denies any side effects.  He is not sure he wishes to stop this at this time.  He has hyperlipidemia.  Past history of CABG.  Goal LDL less than 70.  Will need refills of Lipitor.  We reviewed his most recent lipids which were March of this year with total cholesterol 108, triglycerides 116, HDL 42, LDL 42.  Denies any myalgias.  No recent chest pains  Past Medical History:  Diagnosis Date  . At risk for sleep apnea    STOP-BANG= 6    SENT TO PCP 05-25-2014  . Benign positional vertigo   . Bladder cancer ([facility])   . Coronary artery disease CARDIOLOGIST--  DR Acie Fredrickson  . ED (erectile dysfunction)   . GERD  (gastroesophageal reflux disease)   . H/O hiatal hernia   . History of anal fissures   . History of colon polyps   . History of radiation therapy 08/24/12-10/22/12   prostate 6600cGy/33 sessions--  for biochemical recurrence  . Hyperlipidemia   . Hypertension   . OA (osteoarthritis)   . Recurrent prostate adenocarcinoma ([place]) first dx 09/2005--  radial prostatectomy-- gleason 4+3=7, pT2c, negative margin   04/2012--  BIOCHEMICAL  RECURRENCE to 0.33 /  SALVAGE IMRT SEPT to  NOV 2013  . S/P CABG x 2    09/  2014  . S/P dilatation of esophageal stricture    1992  . Sigmoid diverticulosis   . SUI (stress urinary incontinence), male   . Urethral stricture   . Wears glasses    Past Surgical History:  Procedure Laterality Date  . CARDIAC CATHETERIZATION  08-27-2013  DR Martinique   CRITICAL OSTIAL LM STENOSIS/  diffuse disease LAD 40-50%  &   pLCx 30-40%/  . CARDIOVASCULAR STRESS TEST  08-18-2013  DR NASHER   SMALL/ MEDIUM AREA MILD SCAR INFEROSEPTAL WALL/ NO ISCHEMIA/  LOW RISK SCAN/  EF 62%/  LV WALL MOTION WITH SLIGHT DECREASED OF THE SEPTUM  . CORONARY ANGIOPLASTY  1991   PTCA  of LEFT CFX  . CORONARY ARTERY BYPASS GRAFT N/A 08/27/2013   Procedure: CORONARY ARTERY BYPASS GRAFTING (CABG) times two using left internal mammary and right saphenous vein using endoscope.;  Surgeon:  Melrose Nakayama, MD;  Location: [facility];  Service: Open Heart Surgery;  Laterality: N/A;  . CYSTOSCOPY WITH URETHRAL DILATATION N/A 01/26/2014   Procedure: CYSTOSCOPY WITH URETHRAL DILATATION, BLADDER BIOPSY, FOLEY CATHETER;  Surgeon: Molli Hazard, MD;  Location: [facility];  Service: Urology;  Laterality: N/A;  . INGUINAL HERNIA REPAIR Bilateral 1994  . KNEE ARTHROSCOPY Right   . LEFT HEART CATHETERIZATION WITH CORONARY ANGIOGRAM N/A 08/27/2013   Procedure: LEFT HEART CATHETERIZATION WITH CORONARY ANGIOGRAM;  Surgeon: Peter M Martinique, MD;  Location: [facility] CATH LAB;  Service: Cardiovascular;  Laterality: N/A;  .  LUMBAR DISC SURGERY  1980'S  . RADICAL RETROPUBIC PROSTATECTOMY W/ BILATERAL PELVIC NODE DISSECTION  11-18-2005  . URETHROTOMY N/A 05/30/2014   Procedure: CYSTOSCOPY  BALLOON DILATION AND DIRECT VISION INTERNAL URETHROTOMY;  Surgeon: Sharyn Creamer, MD;  Location: [facility];  Service: Urology;  Laterality: N/A;    reports that he quit smoking about 25 years ago. His smoking use included cigarettes. He has a 20.00 pack-year smoking history. He has never used smokeless tobacco. He reports current alcohol use. He reports that he does not use drugs. family history includes Arthritis in an other family member; Cancer in his brother and father; Hyperlipidemia in an other family member; Hypertension in an other family member. Allergies  Allergen Reactions  . Bee Venom Other (See Comments)    Reaction unknown  . Amoxicillin Rash  . Hydrocodone Other (See Comments)    REACTION: disorientation      Observations/Objective: Patient sounds cheerful and well on the phone. I do not appreciate any SOB. Speech and thought processing are grossly intact. Patient reported vitals:  Assessment and Plan:  #1 history of recurrent depression currently stable on sertraline  -Refill sertraline for 1 year  #2 hyperlipidemia.  Goal LDL less than 70  -Refill atorvastatin -Recommend 20-month follow-up and will need repeat labs by then  Follow Up Instructions:  - as above, in 6 months.    [id number] 5-10 [id number] 11-20 [id number] 21-30 I did not refer this patient for an OV in the next 24 hours for this/these issue(s).  I discussed the assessment and treatment plan with the patient. The patient was provided an opportunity to ask questions and all were answered. The patient agreed with the plan and demonstrated an understanding of the instructions.   The patient was advised to call back or seek an in-person evaluation if the symptoms worsen or if the condition fails to improve as anticipated.  I  provided 18 minutes of non-face-to-face time during this encounter.   [name], MD

## 2020-08-09 NOTE — Telephone Encounter (Signed)
error 

## 2020-08-22 ENCOUNTER — Other Ambulatory Visit: Payer: Medicare HMO

## 2020-08-22 ENCOUNTER — Other Ambulatory Visit: Payer: Self-pay

## 2020-08-22 DIAGNOSIS — I10 Essential (primary) hypertension: Secondary | ICD-10-CM | POA: Diagnosis not present

## 2020-08-22 DIAGNOSIS — I714 Abdominal aortic aneurysm, without rupture, unspecified: Secondary | ICD-10-CM

## 2020-08-22 DIAGNOSIS — I2510 Atherosclerotic heart disease of native coronary artery without angina pectoris: Secondary | ICD-10-CM

## 2020-08-22 DIAGNOSIS — I5022 Chronic systolic (congestive) heart failure: Secondary | ICD-10-CM | POA: Diagnosis not present

## 2020-08-22 LAB — BASIC METABOLIC PANEL
BUN/Creatinine Ratio: 15 (ref 10–24)
BUN: 22 mg/dL (ref 8–27)
CO2: 24 mmol/L (ref 20–29)
Calcium: 9.4 mg/dL (ref 8.6–10.2)
Chloride: 103 mmol/L (ref 96–106)
Creatinine, Ser: 1.46 mg/dL — ABNORMAL HIGH (ref 0.76–1.27)
GFR calc Af Amer: 51 mL/min/{1.73_m2} — ABNORMAL LOW (ref >59)
GFR calc non Af Amer: 44 mL/min/{1.73_m2} — ABNORMAL LOW (ref >59)
Glucose: 87 mg/dL (ref 65–99)
Potassium: 5.2 mmol/L (ref 3.5–5.2)
Sodium: 138 mmol/L (ref 134–144)

## 2020-09-08 ENCOUNTER — Other Ambulatory Visit: Payer: Self-pay

## 2020-09-08 ENCOUNTER — Encounter: Payer: Self-pay | Admitting: Family Medicine

## 2020-09-08 ENCOUNTER — Ambulatory Visit (INDEPENDENT_AMBULATORY_CARE_PROVIDER_SITE_OTHER): Payer: Medicare HMO | Admitting: Family Medicine

## 2020-09-08 VITALS — BP 118/82 | HR 57 | Temp 98.0°F | Wt 143.9 lb

## 2020-09-08 DIAGNOSIS — S40811A Abrasion of right upper arm, initial encounter: Secondary | ICD-10-CM

## 2020-09-08 NOTE — Patient Instructions (Signed)
Leave off the Solarcaine and Neosporin  Keep clean daily with soap and water and apply the topical Vaseline once daily.

## 2020-09-08 NOTE — Progress Notes (Signed)
Established Patient Office Visit  Subjective:  Patient ID: [name], male    DOB: 1936-11-01  Age: 84 y.o. MRN: [id number]  CC:  Chief Complaint  Patient presents with  . Wound Check    right upper arm hasnt healed. Has been 3 weeks     HPI [name] presents for abrasion right arm.  He states he had some blistering on both upper extremities few weeks ago.  He was working on a split rail fence and apparently one of the pieces fell down and struck against his right arm and ruptured the blister.  He has had poor healing since then.  Intermittent bleeding from the area of abrasion.  No surrounding erythema.  No drainage.  He has been applying topical Solarcaine and Neosporin.  Past Medical History:  Diagnosis Date  . At risk for sleep apnea    STOP-BANG= 6    SENT TO PCP 05-25-2014  . Benign positional vertigo   . Bladder cancer ([place])   . Coronary artery disease CARDIOLOGIST--  DR Acie Fredrickson  . ED (erectile dysfunction)   . GERD (gastroesophageal reflux disease)   . H/O hiatal hernia   . History of anal fissures   . History of colon polyps   . History of radiation therapy 08/24/12-10/22/12   prostate 6600cGy/33 sessions--  for biochemical recurrence  . Hyperlipidemia   . Hypertension   . OA (osteoarthritis)   . Recurrent prostate adenocarcinoma ([place]) first dx 09/2005--  radial prostatectomy-- gleason 4+3=7, pT2c, negative margin   04/2012--  BIOCHEMICAL  RECURRENCE to 0.33 /  SALVAGE IMRT SEPT to  NOV 2013  . S/P CABG x 2    09/  2014  . S/P dilatation of esophageal stricture    1992  . Sigmoid diverticulosis   . SUI (stress urinary incontinence), male   . Urethral stricture   . Wears glasses     Past Surgical History:  Procedure Laterality Date  . CARDIAC CATHETERIZATION  08-27-2013  DR Martinique   CRITICAL OSTIAL LM STENOSIS/  diffuse disease LAD 40-50%  &   pLCx 30-40%/  . CARDIOVASCULAR STRESS TEST  08-18-2013  DR NASHER   SMALL/ MEDIUM AREA MILD SCAR  INFEROSEPTAL WALL/ NO ISCHEMIA/  LOW RISK SCAN/  EF 62%/  LV WALL MOTION WITH SLIGHT DECREASED OF THE SEPTUM  . CORONARY ANGIOPLASTY  1991   PTCA  of LEFT CFX  . CORONARY ARTERY BYPASS GRAFT N/A 08/27/2013   Procedure: CORONARY ARTERY BYPASS GRAFTING (CABG) times two using left internal mammary and right saphenous vein using endoscope.;  Surgeon: Melrose Nakayama, MD;  Location: [facility];  Service: Open Heart Surgery;  Laterality: N/A;  . CYSTOSCOPY WITH URETHRAL DILATATION N/A 01/26/2014   Procedure: CYSTOSCOPY WITH URETHRAL DILATATION, BLADDER BIOPSY, FOLEY CATHETER;  Surgeon: Molli Hazard, MD;  Location[facility];  Service: Urology;  Laterality: N/A;  . INGUINAL HERNIA REPAIR Bilateral 1994  . KNEE ARTHROSCOPY Right   . LEFT HEART CATHETERIZATION WITH CORONARY ANGIOGRAM N/A 08/27/2013   Procedure: LEFT HEART CATHETERIZATION WITH CORONARY ANGIOGRAM;  Surgeon: Peter M Martinique, MD;  Location: [facility] CATH LAB;  Service: Cardiovascular;  Laterality: N/A;  . LUMBAR DISC SURGERY  1980'S  . RADICAL RETROPUBIC PROSTATECTOMY W/ BILATERAL PELVIC NODE DISSECTION  11-18-2005  . URETHROTOMY N/A 05/30/2014   Procedure: CYSTOSCOPY  BALLOON DILATION AND DIRECT VISION INTERNAL URETHROTOMY;  Surgeon: Sharyn Creamer, MD;  Location: [facility];  Service: Urology;  Laterality: N/A;  Family History  Problem Relation Age of Onset  . Cancer Father        prostate died 67 something  . Cancer Brother        prostate cancer  . Arthritis Other   . Hyperlipidemia Other   . Hypertension Other     Social History   Socioeconomic History  . Marital status: Single    Spouse name: Not on file  . Number of children: 2  . Years of education: 58  . Highest education level: Not on file  Occupational History  . Occupation: Retired    Fish farm manager: RETIRED  Tobacco Use  . Smoking status: Former Smoker    Packs/day: 2.00    Years: 10.00    Pack years: 20.00    Types: Cigarettes    Quit date:  12/03/1994    Years since quitting: 25.7  . Smokeless tobacco: Never Used  Substance and Sexual Activity  . Alcohol use: Yes    Comment: occasionally  . Drug use: No  . Sexual activity: Not on file  Other Topics Concern  . Not on file  Social History Narrative   Lives alone   Caffeine use: Coffee- 3 cups daily   Soda sometimes.    Social Determinants of Health   Financial Resource Strain:   . Difficulty of Paying Living Expenses: Not on file  Food Insecurity:   . Worried About Charity fundraiser in the Last Year: Not on file  . Ran Out of Food in the Last Year: Not on file  Transportation Needs:   . Lack of Transportation (Medical): Not on file  . Lack of Transportation (Non-Medical): Not on file  Physical Activity:   . Days of Exercise per Week: Not on file  . Minutes of Exercise per Session: Not on file  Stress:   . Feeling of Stress : Not on file  Social Connections:   . Frequency of Communication with Friends and Family: Not on file  . Frequency of Social Gatherings with Friends and Family: Not on file  . Attends Religious Services: Not on file  . Active Member of Clubs or Organizations: Not on file  . Attends Archivist Meetings: Not on file  . Marital Status: Not on file  Intimate Partner Violence:   . Fear of Current or Ex-Partner: Not on file  . Emotionally Abused: Not on file  . Physically Abused: Not on file  . Sexually Abused: Not on file    Outpatient Medications Prior to Visit  Medication Sig Dispense Refill  . aspirin 81 MG EC tablet Take 1 tablet (81 mg total) by mouth daily.    [name] atorvastatin (LIPITOR) 40 MG tablet TAKE 1 TABLET(40 MG) BY MOUTH AT BEDTIME 90 tablet 0  . atorvastatin (LIPITOR) 40 MG tablet Take one tablet by mouth once daily. 90 tablet 3  . betamethasone dipropionate 0.05 % cream Apply 1 application topically 2 (two) times daily.    . carvedilol (COREG) 12.5 MG tablet TAKE 1 TABLET BY MOUTH TWICE A DAY 180 tablet 2  .  furosemide (LASIX) 20 MG tablet Take 1 tablet (20 mg total) by mouth 3 (three) times a week. On Monday, Wednesday, Friday 45 tablet 3  . latanoprost (XALATAN) 0.005 % ophthalmic solution Place 1 drop into both eyes daily.     [name] losartan (COZAAR) 25 MG tablet TAKE 1 TABLET BY MOUTH EVERY DAY 90 tablet 3  . Multiple Vitamin (MULTIVITAMIN WITH MINERALS) TABS tablet Take 1  tablet by mouth daily.    . nitroGLYCERIN (NITROSTAT) 0.4 MG SL tablet PLACE 1 TABLET UNDER THE TONUE EVERY 5 MINUTES AS NEEDED FOR CHEST PAIN. 75 tablet 3  . polyethylene glycol (MIRALAX / GLYCOLAX) packet Take 17 g by mouth daily as needed (for constipation).     . sertraline (ZOLOFT) 50 MG tablet TAKE 1 TABLET BY MOUTH EVERY DAY 90 tablet 0  . sertraline (ZOLOFT) 50 MG tablet Take 1 tablet (50 mg total) by mouth daily. 90 tablet 3  . Turmeric (QC TUMERIC COMPLEX PO) Take 1 tablet by mouth daily.    . vitamin B-12 (CYANOCOBALAMIN) 100 MCG tablet Take 100 mcg by mouth daily.     No facility-administered medications prior to visit.    Allergies  Allergen Reactions  . Bee Venom Other (See Comments)    Reaction unknown  . Amoxicillin Rash  . Hydrocodone Other (See Comments)    REACTION: disorientation    ROS Review of Systems  Constitutional: Negative for chills and fever.      Objective:    Physical Exam Vitals reviewed.  Constitutional:      Appearance: Normal appearance.  Cardiovascular:     Rate and Rhythm: Normal rate and regular rhythm.  Skin:    Comments: Superficial 1.5 x 4 cm abrasion right arm posterior.  No surrounding cellulitis changes.  Neurological:     Mental Status: He is alert.     BP 118/82 (BP Location: Left Arm, Patient Position: Sitting, Cuff Size: Normal)   Pulse (!) 57   Temp 98 F (36.7 C) (Oral)   Wt 143 lb 14.4 oz (65.3 kg)   SpO2 97%   BMI 21.56 kg/m  Wt Readings from Last 3 Encounters:  09/08/20 143 lb 14.4 oz (65.3 kg)  07/31/20 137 lb 12.8 oz (62.5 kg)  03/22/20 140 lb  3.2 oz (63.6 kg)     There are no preventive care reminders to display for this patient.  There are no preventive care reminders to display for this patient.  Lab Results  Component Value Date   TSH 0.93 10/28/2012   Lab Results  Component Value Date   WBC 6.2 05/12/2017   HGB 13.6 05/12/2017   HCT 40.7 05/12/2017   MCV 104.0 (H) 05/12/2017   PLT 160.0 05/12/2017   Lab Results  Component Value Date   NA 138 08/22/2020   K 5.2 08/22/2020   CO2 24 08/22/2020   GLUCOSE 87 08/22/2020   BUN 22 08/22/2020   CREATININE 1.46 (H) 08/22/2020   BILITOT 1.0 02/01/2020   ALKPHOS 67 02/01/2020   AST 21 02/01/2020   ALT 21 02/01/2020   PROT 6.9 02/01/2020   ALBUMIN 4.1 02/01/2020   CALCIUM 9.4 08/22/2020   GFR 47.34 (L) 02/01/2020   Lab Results  Component Value Date   CHOL 108 02/01/2020   Lab Results  Component Value Date   HDL 42.80 02/01/2020   Lab Results  Component Value Date   LDLCALC 42 02/01/2020   Lab Results  Component Value Date   TRIG 116.0 02/01/2020   Lab Results  Component Value Date   CHOLHDL 3 02/01/2020   No results found for: HGBA1C    Assessment & Plan:   Slow healing abrasion right arm.  No signs of secondary infection.  -Leave off Neosporin and Solarcaine.  We explained that those things can slow healing. -Clean daily with soap and water -Apply topical Vaseline -Touch base if not healing in 2 to 3 weeks  and sooner for any signs of secondary infection -Tetanus up-to-date--6/20  No orders of the defined types were placed in this encounter.   Follow-up: No follow-ups on file.    [name], MD

## 2020-09-18 ENCOUNTER — Telehealth: Payer: Self-pay | Admitting: Family Medicine

## 2020-09-18 NOTE — Telephone Encounter (Signed)
Left message for patient to schedule Annual Wellness Visit.  Please schedule with Nurse Health Advisor Shannon Crews, RN at  [facility]  

## 2020-09-27 ENCOUNTER — Encounter: Payer: Self-pay | Admitting: Vascular Surgery

## 2020-09-27 ENCOUNTER — Ambulatory Visit: Payer: Medicare HMO | Admitting: Vascular Surgery

## 2020-09-27 ENCOUNTER — Other Ambulatory Visit: Payer: Self-pay

## 2020-09-27 VITALS — BP 144/78 | HR 53 | Temp 98.2°F | Resp 20 | Ht 68.5 in | Wt 139.0 lb

## 2020-09-27 DIAGNOSIS — I714 Abdominal aortic aneurysm, without rupture, unspecified: Secondary | ICD-10-CM

## 2020-09-27 NOTE — Progress Notes (Signed)
REASON FOR CONSULT:    Abdominal aortic aneurysm.  The consult is requested by Dr. Acie Fredrickson.  ASSESSMENT & PLAN:   ABDOMINAL AORTIC ANEURYSM: This patient has a small 4.4 cm infrarenal abdominal aortic aneurysm.  I have explained that in a normal risk patient we would consider elective repair at 5.5 cm.  He is a long ways from that.  However, given the slight enlargement I have recommended a follow-up duplex in 6 months and I will see him back at that time.  I offered to have that done at Dr. Elmarie Shiley office, but he could not tell me exactly where the studies were being done.  Certainly he could have the studies done there if it is more convenient.  If the aneurysm is stable at that size I probably stretch his follow-up out to 9 months and potentially out to a year if it remains stable.  Fortunately he is not a smoker.  His blood pressures under good control.  I have encouraged him to stay active and I do not think there is any real restrictions on his activity except for very heavy lifting.  I will plan on seeing him back in 6 months.  He knows to call sooner if he has problems.   Deitra Mayo, MD Office: [phone]   HPI:   [name] is a pleasant 84 y.o. male, who is referred with an abdominal aortic aneurysm.  I have reviewed the records from the referring office.  The patient was seen on 07/31/2020.  He is followed with coronary artery disease, hypertension, hyperlipidemia, and prostate cancer.  He undergone PTCA approximately 22 years ago.  He was not having any coronary symptoms.  His blood pressure was under good control although slightly low at times.  His hyperlipidemia was followed closely.  He also has a history of chronic combined congestive heart failure which was stable.  He had a known abdominal aortic aneurysm which had increased slightly from 4.2 cm to 4.4 cm.  For this reason he was sent for vascular consultation.  The patient states that he has been following with an  abdominal aortic aneurysm for several years now.  The aneurysm has been fairly stable in size.  He does have some chronic low back pain.  He denies any abdominal pain.  There is no family history of aneurysmal disease.  I do not get any history of claudication or rest pain.  He is not a smoker.  His blood pressures been under good control.  Past Medical History:  Diagnosis Date  . AAA (abdominal aortic aneurysm) ([place])   . At risk for sleep apnea    STOP-BANG= 6    SENT TO PCP 05-25-2014  . Benign positional vertigo   . Bladder cancer ([place])   . Coronary artery disease CARDIOLOGIST--  DR Acie Fredrickson  . ED (erectile dysfunction)   . GERD (gastroesophageal reflux disease)   . H/O hiatal hernia   . History of anal fissures   . History of colon polyps   . History of radiation therapy 08/24/12-10/22/12   prostate 6600cGy/33 sessions--  for biochemical recurrence  . Hyperlipidemia   . Hypertension   . OA (osteoarthritis)   . Recurrent prostate adenocarcinoma ([place]) first dx 09/2005--  radial prostatectomy-- gleason 4+3=7, pT2c, negative margin   04/2012--  BIOCHEMICAL  RECURRENCE to 0.33 /  SALVAGE IMRT SEPT to  NOV 2013  . S/P CABG x 2    09/  2014  . S/P dilatation of esophageal  stricture    1992  . Sigmoid diverticulosis   . SUI (stress urinary incontinence), male   . Urethral stricture   . Wears glasses     Family History  Problem Relation Age of Onset  . Cancer Father        prostate died 54 something  . Cancer Brother        prostate cancer  . Arthritis Other   . Hyperlipidemia Other   . Hypertension Other     SOCIAL HISTORY: Social History   Socioeconomic History  . Marital status: Single    Spouse name: Not on file  . Number of children: 2  . Years of education: 73  . Highest education level: Not on file  Occupational History  . Occupation: Retired    Fish farm manager: RETIRED  Tobacco Use  . Smoking status: Former Smoker    Packs/day: 2.00    Years: 10.00    Pack  years: 20.00    Types: Cigarettes    Quit date: 12/03/1994    Years since quitting: 25.8  . Smokeless tobacco: Never Used  Vaping Use  . Vaping Use: Never used  Substance and Sexual Activity  . Alcohol use: Yes    Comment: occasionally  . Drug use: No  . Sexual activity: Not on file  Other Topics Concern  . Not on file  Social History Narrative   Lives alone   Caffeine use: Coffee- 3 cups daily   Soda sometimes.    Social Determinants of Health   Financial Resource Strain:   . Difficulty of Paying Living Expenses: Not on file  Food Insecurity:   . Worried About Charity fundraiser in the Last Year: Not on file  . Ran Out of Food in the Last Year: Not on file  Transportation Needs:   . Lack of Transportation (Medical): Not on file  . Lack of Transportation (Non-Medical): Not on file  Physical Activity:   . Days of Exercise per Week: Not on file  . Minutes of Exercise per Session: Not on file  Stress:   . Feeling of Stress : Not on file  Social Connections:   . Frequency of Communication with Friends and Family: Not on file  . Frequency of Social Gatherings with Friends and Family: Not on file  . Attends Religious Services: Not on file  . Active Member of Clubs or Organizations: Not on file  . Attends Archivist Meetings: Not on file  . Marital Status: Not on file  Intimate Partner Violence:   . Fear of Current or Ex-Partner: Not on file  . Emotionally Abused: Not on file  . Physically Abused: Not on file  . Sexually Abused: Not on file    Allergies  Allergen Reactions  . Bee Venom Other (See Comments)    Reaction unknown  . Amoxicillin Rash  . Hydrocodone Other (See Comments)    REACTION: disorientation    Current Outpatient Medications  Medication Sig Dispense Refill  . aspirin 81 MG EC tablet Take 1 tablet (81 mg total) by mouth daily.    [name] atorvastatin (LIPITOR) 40 MG tablet TAKE 1 TABLET(40 MG) BY MOUTH AT BEDTIME 90 tablet 0  . atorvastatin  (LIPITOR) 40 MG tablet Take one tablet by mouth once daily. 90 tablet 3  . betamethasone dipropionate 0.05 % cream Apply 1 application topically 2 (two) times daily.    . carvedilol (COREG) 12.5 MG tablet TAKE 1 TABLET BY MOUTH TWICE A DAY 180 tablet  2  . furosemide (LASIX) 20 MG tablet Take 1 tablet (20 mg total) by mouth 3 (three) times a week. On Monday, Wednesday, Friday 45 tablet 3  . latanoprost (XALATAN) 0.005 % ophthalmic solution Place 1 drop into both eyes daily.     [name] losartan (COZAAR) 25 MG tablet TAKE 1 TABLET BY MOUTH EVERY DAY 90 tablet 3  . Multiple Vitamin (MULTIVITAMIN WITH MINERALS) TABS tablet Take 1 tablet by mouth daily.    . nitroGLYCERIN (NITROSTAT) 0.4 MG SL tablet PLACE 1 TABLET UNDER THE TONUE EVERY 5 MINUTES AS NEEDED FOR CHEST PAIN. 75 tablet 3  . polyethylene glycol (MIRALAX / GLYCOLAX) packet Take 17 g by mouth daily as needed (for constipation).     . sertraline (ZOLOFT) 50 MG tablet TAKE 1 TABLET BY MOUTH EVERY DAY 90 tablet 0  . sertraline (ZOLOFT) 50 MG tablet Take 1 tablet (50 mg total) by mouth daily. 90 tablet 3  . Turmeric (QC TUMERIC COMPLEX PO) Take 1 tablet by mouth daily.    . vitamin B-12 (CYANOCOBALAMIN) 100 MCG tablet Take 100 mcg by mouth daily.     No current facility-administered medications for this visit.    REVIEW OF SYSTEMS:  [X]  denotes positive finding, [ ]  denotes negative finding Cardiac  Comments:  Chest pain or chest pressure:    Shortness of breath upon exertion:    Short of breath when lying flat:    Irregular heart rhythm:        Vascular    Pain in calf, thigh, or hip brought on by ambulation: x   Pain in feet at night that wakes you up from your sleep:     Blood clot in your veins:    Leg swelling:         Pulmonary    Oxygen at home:    Productive cough:     Wheezing:         Neurologic    Sudden weakness in arms or legs:     Sudden numbness in arms or legs:     Sudden onset of difficulty speaking or slurred  speech:    Temporary loss of vision in one eye:     Problems with dizziness:         Gastrointestinal    Blood in stool:     Vomited blood:         Genitourinary    Burning when urinating:     Blood in urine:        Psychiatric    Major depression:         Hematologic    Bleeding problems:    Problems with blood clotting too easily:        Skin    Rashes or ulcers:        Constitutional    Fever or chills:     PHYSICAL EXAM:   Vitals:   09/27/20 1305  BP: (!) 144/78  Pulse: (!) 53  Resp: 20  Temp: 98.2 F (36.8 C)  SpO2: 97%  Weight: 139 lb (63 kg)  Height: 5' 8.5" (1.74 m)    GENERAL: The patient is a well-nourished male, in no acute distress. The vital signs are documented above. CARDIAC: There is a regular rate and rhythm.  VASCULAR: I do not detect carotid bruits. He has palpable femoral, popliteal, and posterior tibial pulses bilaterally. He has no significant lower extremity swelling. PULMONARY: There is good air exchange bilaterally without wheezing or rales. ABDOMEN:  Soft and non-tender with normal pitched bowel sounds.  His aneurysm is palpable and nontender. MUSCULOSKELETAL: There are no major deformities or cyanosis. NEUROLOGIC: No focal weakness or paresthesias are detected. SKIN: There are no ulcers or rashes noted. PSYCHIATRIC: The patient has a normal affect.  DATA:    DUPLEX ABDOMINAL AORTA: I have reviewed his abdominal aortic duplex that was done on 07/10/2020.  The maximum diameter of his infrarenal aorta was 4.4 cm.  The right common iliac artery measures 1.6 cm in maximum diameter.  The left common iliac artery measures 1.2 cm in maximum diameter.  LABS: I reviewed the labs from 08/22/2020.  His GFR was 44.  Creatinine 1.46.

## 2020-10-09 DIAGNOSIS — Z8546 Personal history of malignant neoplasm of prostate: Secondary | ICD-10-CM | POA: Diagnosis not present

## 2020-10-09 LAB — PSA: PSA: <0.015

## 2020-10-16 DIAGNOSIS — N393 Stress incontinence (female) (male): Secondary | ICD-10-CM | POA: Diagnosis not present

## 2020-10-16 DIAGNOSIS — Z8546 Personal history of malignant neoplasm of prostate: Secondary | ICD-10-CM | POA: Diagnosis not present

## 2020-10-16 DIAGNOSIS — D414 Neoplasm of uncertain behavior of bladder: Secondary | ICD-10-CM | POA: Diagnosis not present

## 2020-10-16 DIAGNOSIS — N320 Bladder-neck obstruction: Secondary | ICD-10-CM | POA: Diagnosis not present

## 2020-10-17 ENCOUNTER — Other Ambulatory Visit: Payer: Self-pay

## 2020-10-17 ENCOUNTER — Encounter: Payer: Self-pay | Admitting: Family Medicine

## 2020-10-17 ENCOUNTER — Ambulatory Visit (INDEPENDENT_AMBULATORY_CARE_PROVIDER_SITE_OTHER): Payer: Medicare HMO | Admitting: Family Medicine

## 2020-10-17 VITALS — BP 127/72 | HR 85 | Ht 68.5 in | Wt 138.0 lb

## 2020-10-17 DIAGNOSIS — S39012A Strain of muscle, fascia and tendon of lower back, initial encounter: Secondary | ICD-10-CM | POA: Diagnosis not present

## 2020-10-17 MED ORDER — TRAMADOL HCL 50 MG PO TABS: 50.0000 mg | ORAL_TABLET | Freq: Four times a day (QID) | ORAL | 0 refills | Status: AC | PRN

## 2020-10-17 NOTE — Patient Instructions (Signed)

## 2020-10-17 NOTE — Progress Notes (Signed)
Established Patient Office Visit  Subjective:  Patient ID: [name], male    DOB: 09-06-1936  Age: 84 y.o. MRN: [id number]  CC:  Chief Complaint  Patient presents with  . Back Pain    HPI [name] presents for left lower lumbar back pain.  First noted last Thursday.  He has been working on a fence but denies any specific injury.  Pain is worse with movement.  No radiculitis symptoms.  Pain is left lower lumbar region.  He has been using some type of mentholated cream with minimal relief.  Also using a back brace.  He tried Tylenol and Aleve without much improvement.  Pain is 8 out of 10 at its worse.  No dysuria.  No fevers or chills.  No skin rash.  Past Medical History:  Diagnosis Date  . AAA (abdominal aortic aneurysm) ([place])   . At risk for sleep apnea    STOP-BANG= 6    SENT TO PCP 05-25-2014  . Benign positional vertigo   . Bladder cancer ([place])   . Coronary artery disease CARDIOLOGIST--  DR Acie Fredrickson  . ED (erectile dysfunction)   . GERD (gastroesophageal reflux disease)   . H/O hiatal hernia   . History of anal fissures   . History of colon polyps   . History of radiation therapy 08/24/12-10/22/12   prostate 6600cGy/33 sessions--  for biochemical recurrence  . Hyperlipidemia   . Hypertension   . OA (osteoarthritis)   . Recurrent prostate adenocarcinoma ([place]) first dx 09/2005--  radial prostatectomy-- gleason 4+3=7, pT2c, negative margin   04/2012--  BIOCHEMICAL  RECURRENCE to 0.33 /  SALVAGE IMRT SEPT to  NOV 2013  . S/P CABG x 2    09/  2014  . S/P dilatation of esophageal stricture    1992  . Sigmoid diverticulosis   . SUI (stress urinary incontinence), male   . Urethral stricture   . Wears glasses     Past Surgical History:  Procedure Laterality Date  . CARDIAC CATHETERIZATION  08-27-2013  DR Martinique   CRITICAL OSTIAL LM STENOSIS/  diffuse disease LAD 40-50%  &   pLCx 30-40%/  . CARDIOVASCULAR STRESS TEST  08-18-2013  DR NASHER   SMALL/ MEDIUM  AREA MILD SCAR INFEROSEPTAL WALL/ NO ISCHEMIA/  LOW RISK SCAN/  EF 62%/  LV WALL MOTION WITH SLIGHT DECREASED OF THE SEPTUM  . CORONARY ANGIOPLASTY  1991   PTCA  of LEFT CFX  . CORONARY ARTERY BYPASS GRAFT N/A 08/27/2013   Procedure: CORONARY ARTERY BYPASS GRAFTING (CABG) times two using left internal mammary and right saphenous vein using endoscope.;  Surgeon: Melrose Nakayama, MD;  Location: [facility];  Service: Open Heart Surgery;  Laterality: N/A;  . CYSTOSCOPY WITH URETHRAL DILATATION N/A 01/26/2014   Procedure: CYSTOSCOPY WITH URETHRAL DILATATION, BLADDER BIOPSY, FOLEY CATHETER;  Surgeon: Molli Hazard, MD;  Location: [facility];  Service: Urology;  Laterality: N/A;  . INGUINAL HERNIA REPAIR Bilateral 1994  . KNEE ARTHROSCOPY Right   . LEFT HEART CATHETERIZATION WITH CORONARY ANGIOGRAM N/A 08/27/2013   Procedure: LEFT HEART CATHETERIZATION WITH CORONARY ANGIOGRAM;  Surgeon: Peter M Martinique, MD;  Location[facility];  Service: Cardiovascular;  Laterality: N/A;  . LUMBAR DISC SURGERY  1980'S  . RADICAL RETROPUBIC PROSTATECTOMY W/ BILATERAL PELVIC NODE DISSECTION  11-18-2005  . URETHROTOMY N/A 05/30/2014   Procedure: CYSTOSCOPY  BALLOON DILATION AND DIRECT VISION INTERNAL URETHROTOMY;  Surgeon: Sharyn Creamer, MD;  Location: [facility];  Service: Urology;  Laterality: N/A;    Family History  Problem Relation Age of Onset  . Cancer Father        prostate died 79 something  . Cancer Brother        prostate cancer  . Arthritis Other   . Hyperlipidemia Other   . Hypertension Other     Social History   Socioeconomic History  . Marital status: Single    Spouse name: Not on file  . Number of children: 2  . Years of education: 1  . Highest education level: Not on file  Occupational History  . Occupation: Retired    Fish farm manager: RETIRED  Tobacco Use  . Smoking status: Former Smoker    Packs/day: 2.00    Years: 10.00    Pack years: 20.00    Types: Cigarettes     Quit date: 12/03/1994    Years since quitting: 25.8  . Smokeless tobacco: Never Used  Vaping Use  . Vaping Use: Never used  Substance and Sexual Activity  . Alcohol use: Yes    Comment: occasionally  . Drug use: No  . Sexual activity: Not on file  Other Topics Concern  . Not on file  Social History Narrative   Lives alone   Caffeine use: Coffee- 3 cups daily   Soda sometimes.    Social Determinants of Health   Financial Resource Strain:   . Difficulty of Paying Living Expenses: Not on file  Food Insecurity:   . Worried About Charity fundraiser in the Last Year: Not on file  . Ran Out of Food in the Last Year: Not on file  Transportation Needs:   . Lack of Transportation (Medical): Not on file  . Lack of Transportation (Non-Medical): Not on file  Physical Activity:   . Days of Exercise per Week: Not on file  . Minutes of Exercise per Session: Not on file  Stress:   . Feeling of Stress : Not on file  Social Connections:   . Frequency of Communication with Friends and Family: Not on file  . Frequency of Social Gatherings with Friends and Family: Not on file  . Attends Religious Services: Not on file  . Active Member of Clubs or Organizations: Not on file  . Attends Archivist Meetings: Not on file  . Marital Status: Not on file  Intimate Partner Violence:   . Fear of Current or Ex-Partner: Not on file  . Emotionally Abused: Not on file  . Physically Abused: Not on file  . Sexually Abused: Not on file    Outpatient Medications Prior to Visit  Medication Sig Dispense Refill  . aspirin 81 MG EC tablet Take 1 tablet (81 mg total) by mouth daily.    [name] atorvastatin (LIPITOR) 40 MG tablet TAKE 1 TABLET(40 MG) BY MOUTH AT BEDTIME 90 tablet 0  . atorvastatin (LIPITOR) 40 MG tablet Take one tablet by mouth once daily. 90 tablet 3  . betamethasone dipropionate 0.05 % cream Apply 1 application topically 2 (two) times daily.    . carvedilol (COREG) 12.5 MG tablet TAKE 1  TABLET BY MOUTH TWICE A DAY 180 tablet 2  . furosemide (LASIX) 20 MG tablet Take 1 tablet (20 mg total) by mouth 3 (three) times a week. On Monday, Wednesday, Friday 45 tablet 3  . latanoprost (XALATAN) 0.005 % ophthalmic solution Place 1 drop into both eyes daily.     [name] losartan (COZAAR) 25 MG tablet TAKE 1  TABLET BY MOUTH EVERY DAY 90 tablet 3  . Multiple Vitamin (MULTIVITAMIN WITH MINERALS) TABS tablet Take 1 tablet by mouth daily.    . nitroGLYCERIN (NITROSTAT) 0.4 MG SL tablet PLACE 1 TABLET UNDER THE TONUE EVERY 5 MINUTES AS NEEDED FOR CHEST PAIN. 75 tablet 3  . polyethylene glycol (MIRALAX / GLYCOLAX) packet Take 17 g by mouth daily as needed (for constipation).     . sertraline (ZOLOFT) 50 MG tablet TAKE 1 TABLET BY MOUTH EVERY DAY 90 tablet 0  . sertraline (ZOLOFT) 50 MG tablet Take 1 tablet (50 mg total) by mouth daily. 90 tablet 3  . Turmeric (QC TUMERIC COMPLEX PO) Take 1 tablet by mouth daily.    . vitamin B-12 (CYANOCOBALAMIN) 100 MCG tablet Take 100 mcg by mouth daily.     No facility-administered medications prior to visit.    Allergies  Allergen Reactions  . Bee Venom Other (See Comments)    Reaction unknown  . Amoxicillin Rash  . Hydrocodone Other (See Comments)    REACTION: disorientation    ROS Review of Systems  Constitutional: Negative for activity change, appetite change and fever.  Respiratory: Negative for cough and shortness of breath.   Cardiovascular: Negative for chest pain and leg swelling.  Gastrointestinal: Negative for abdominal pain and vomiting.  Genitourinary: Negative for dysuria, flank pain and hematuria.  Musculoskeletal: Positive for back pain. Negative for joint swelling.  Neurological: Negative for weakness and numbness.      Objective:    Physical Exam Constitutional:      General: He is not in acute distress.    Appearance: He is well-developed.  Neck:     Thyroid: No thyromegaly.  Cardiovascular:     Rate and Rhythm: Normal  rate and regular rhythm.     Heart sounds: Normal heart sounds. No murmur heard.   Pulmonary:     Effort: Pulmonary effort is normal. No respiratory distress.     Breath sounds: Normal breath sounds. No wheezing or rales.  Skin:    Findings: No rash.  Neurological:     Mental Status: He is alert and oriented to person, place, and time.     Cranial Nerves: No cranial nerve deficit.     Deep Tendon Reflexes: Reflexes are normal and symmetric.     BP 127/72   Pulse 85   Ht 5' 8.5" (1.74 m)   Wt 138 lb (62.6 kg)   BMI 20.68 kg/m  Wt Readings from Last 3 Encounters:  10/17/20 138 lb (62.6 kg)  09/27/20 139 lb (63 kg)  09/08/20 143 lb 14.4 oz (65.3 kg)     There are no preventive care reminders to display for this patient.  There are no preventive care reminders to display for this patient.  Lab Results  Component Value Date   TSH 0.93 10/28/2012   Lab Results  Component Value Date   WBC 6.2 05/12/2017   HGB 13.6 05/12/2017   HCT 40.7 05/12/2017   MCV 104.0 (H) 05/12/2017   PLT 160.0 05/12/2017   Lab Results  Component Value Date   NA 138 08/22/2020   K 5.2 08/22/2020   CO2 24 08/22/2020   GLUCOSE 87 08/22/2020   BUN 22 08/22/2020   CREATININE 1.46 (H) 08/22/2020   BILITOT 1.0 02/01/2020   ALKPHOS 67 02/01/2020   AST 21 02/01/2020   ALT 21 02/01/2020   PROT 6.9 02/01/2020   ALBUMIN 4.1 02/01/2020   CALCIUM 9.4 08/22/2020   GFR 47.34 (  L) 02/01/2020   Lab Results  Component Value Date   CHOL 108 02/01/2020   Lab Results  Component Value Date   HDL 42.80 02/01/2020   Lab Results  Component Value Date   LDLCALC 42 02/01/2020   Lab Results  Component Value Date   TRIG 116.0 02/01/2020   Lab Results  Component Value Date   CHOLHDL 3 02/01/2020   No results found for: HGBA1C    Assessment & Plan:   Acute left lower lumbar back pain without sciatica.  Suspect muscular strain most likely.  -Continue conservative therapy with heat and/or ice,  Tylenol, topical sports creams -Wrote for limited tramadol 50 mg every 6 hours as needed for pain #20 with no refill -We gave him handout on some back stretches -Consider physical therapy trial if not improving in 1 to 2 weeks  Meds ordered this encounter  Medications  . traMADol (ULTRAM) 50 MG tablet    Sig: Take 1 tablet (50 mg total) by mouth every 6 (six) hours as needed for up to 5 days.    Dispense:  20 tablet    Refill:  0    Follow-up: No follow-ups on file.    [name], MD

## 2020-10-19 ENCOUNTER — Other Ambulatory Visit: Payer: Self-pay | Admitting: Urology

## 2020-10-23 ENCOUNTER — Encounter (HOSPITAL_BASED_OUTPATIENT_CLINIC_OR_DEPARTMENT_OTHER): Payer: Self-pay | Admitting: Urology

## 2020-10-23 ENCOUNTER — Other Ambulatory Visit: Payer: Self-pay

## 2020-10-23 NOTE — Progress Notes (Signed)
Spoke w/ via phone for pre-op interview--- pt Lab needs dos----  I stat 8             COVID test ------10-28-2020 1005 Arrive at -------930 am 11-02-2020 NPO after MN NO Solid Food.  Clear liquids from MN until---830 am then npo Medications to take morning of surgery -----sertraine, carvedilol, atorvastatin Diabetic medication -----n/a Patient Special Instructions -----none Pre-Op special Istructions -----none Patient verbalized understanding of instructions that were given at this phone interview. Patient denies shortness of breath, chest pain, fever, cough at this phone interview.  Anesthesia Review: no, pt states no sob with climbing stairs, has never used nitroglycerin  PCP: dr Darnell Level burchette lov 10-17-2020 epic Cardiologist :dr Macon Large 07-31-2020 epic Chest x-ray :none EKG : 07-31-2020 epic Echo : 09-01-2018 epic Stress test:09-16-2018 epic Cardiac Cath : 08-27-2013 epic Vascular lov dr Scot Dock 09-27-2020 epic US aorta 04-27-2019 epic Activity level: does housework, can climb stairs wothout difficulty Sleep Study/ CPAP :n/a Fasting Blood Sugar :      / Checks Blood Sugar -- times a day:  n/a Blood Thinner/ Instructions /Last Dose:n/a ASA / Instructions/ Last Dose : follow 81 mg aspirin instructions from dr Louis Meckel

## 2020-10-28 ENCOUNTER — Other Ambulatory Visit (HOSPITAL_COMMUNITY)
Admission: RE | Admit: 2020-10-28 | Discharge: 2020-10-28 | Disposition: A | Discharge status: Home / Self Care | Payer: Medicare HMO | Source: Ambulatory Visit | Attending: Urology | Admitting: Urology

## 2020-10-28 DIAGNOSIS — Z20822 Contact with and (suspected) exposure to covid-19: Secondary | ICD-10-CM | POA: Insufficient documentation

## 2020-10-28 DIAGNOSIS — Z01812 Encounter for preprocedural laboratory examination: Secondary | ICD-10-CM | POA: Insufficient documentation

## 2020-10-29 LAB — SARS CORONAVIRUS 2 (TAT 6-24 HRS): SARS Coronavirus 2: NEGATIVE

## 2020-11-01 ENCOUNTER — Ambulatory Visit (HOSPITAL_BASED_OUTPATIENT_CLINIC_OR_DEPARTMENT_OTHER): Payer: Medicare HMO | Admitting: Anesthesiology

## 2020-11-01 ENCOUNTER — Other Ambulatory Visit: Payer: Self-pay

## 2020-11-01 ENCOUNTER — Encounter (HOSPITAL_BASED_OUTPATIENT_CLINIC_OR_DEPARTMENT_OTHER)
Admission: RE | Disposition: A | Discharge status: Home / Self Care | Payer: Self-pay | Source: Home / Self Care | Attending: Urology

## 2020-11-01 ENCOUNTER — Encounter (HOSPITAL_BASED_OUTPATIENT_CLINIC_OR_DEPARTMENT_OTHER): Payer: Self-pay | Admitting: Urology

## 2020-11-01 ENCOUNTER — Ambulatory Visit (HOSPITAL_BASED_OUTPATIENT_CLINIC_OR_DEPARTMENT_OTHER)
Admission: RE | Admit: 2020-11-01 | Discharge: 2020-11-01 | Disposition: A | Discharge status: Home / Self Care | Payer: Medicare HMO | Attending: Urology | Admitting: Urology

## 2020-11-01 DIAGNOSIS — I714 Abdominal aortic aneurysm, without rupture: Secondary | ICD-10-CM | POA: Diagnosis not present

## 2020-11-01 DIAGNOSIS — Z923 Personal history of irradiation: Secondary | ICD-10-CM | POA: Insufficient documentation

## 2020-11-01 DIAGNOSIS — Z7982 Long term (current) use of aspirin: Secondary | ICD-10-CM | POA: Diagnosis not present

## 2020-11-01 DIAGNOSIS — Z87891 Personal history of nicotine dependence: Secondary | ICD-10-CM | POA: Insufficient documentation

## 2020-11-01 DIAGNOSIS — Z885 Allergy status to narcotic agent status: Secondary | ICD-10-CM | POA: Insufficient documentation

## 2020-11-01 DIAGNOSIS — N393 Stress incontinence (female) (male): Secondary | ICD-10-CM | POA: Diagnosis not present

## 2020-11-01 DIAGNOSIS — Z79899 Other long term (current) drug therapy: Secondary | ICD-10-CM | POA: Insufficient documentation

## 2020-11-01 DIAGNOSIS — Z951 Presence of aortocoronary bypass graft: Secondary | ICD-10-CM | POA: Insufficient documentation

## 2020-11-01 DIAGNOSIS — I10 Essential (primary) hypertension: Secondary | ICD-10-CM | POA: Diagnosis not present

## 2020-11-01 DIAGNOSIS — Z880 Allergy status to penicillin: Secondary | ICD-10-CM | POA: Insufficient documentation

## 2020-11-01 DIAGNOSIS — Z9079 Acquired absence of other genital organ(s): Secondary | ICD-10-CM | POA: Diagnosis not present

## 2020-11-01 DIAGNOSIS — N320 Bladder-neck obstruction: Secondary | ICD-10-CM

## 2020-11-01 DIAGNOSIS — Z8546 Personal history of malignant neoplasm of prostate: Secondary | ICD-10-CM | POA: Diagnosis not present

## 2020-11-01 DIAGNOSIS — Z8551 Personal history of malignant neoplasm of bladder: Secondary | ICD-10-CM | POA: Diagnosis not present

## 2020-11-01 DIAGNOSIS — R3912 Poor urinary stream: Secondary | ICD-10-CM | POA: Insufficient documentation

## 2020-11-01 DIAGNOSIS — Z20822 Contact with and (suspected) exposure to covid-19: Secondary | ICD-10-CM | POA: Insufficient documentation

## 2020-11-01 DIAGNOSIS — E785 Hyperlipidemia, unspecified: Secondary | ICD-10-CM | POA: Diagnosis not present

## 2020-11-01 HISTORY — DX: Bladder-neck obstruction: N32.0

## 2020-11-01 HISTORY — PX: TRANSURETHRAL INCISION OF PROSTATE: SHX2573

## 2020-11-01 HISTORY — DX: Sciatica, unspecified side: M54.30

## 2020-11-01 HISTORY — DX: Peripheral vascular disease, unspecified: I73.9

## 2020-11-01 LAB — POCT I-STAT, CHEM 8
BUN: 28 mg/dL — ABNORMAL HIGH (ref 8–23)
Calcium, Ion: 1.36 mmol/L (ref 1.15–1.40)
Chloride: 106 mmol/L (ref 98–111)
Creatinine, Ser: 1.4 mg/dL — ABNORMAL HIGH (ref 0.61–1.24)
Glucose, Bld: 94 mg/dL (ref 70–99)
HCT: 38 % — ABNORMAL LOW (ref 39.0–52.0)
Hemoglobin: 12.9 g/dL — ABNORMAL LOW (ref 13.0–17.0)
Potassium: 4.8 mmol/L (ref 3.5–5.1)
Sodium: 144 mmol/L (ref 135–145)
TCO2: 27 mmol/L (ref 22–32)

## 2020-11-01 LAB — RESP PANEL BY RT-PCR (FLU A&B, COVID) ARPGX2
Influenza A by PCR: NEGATIVE
Influenza B by PCR: NEGATIVE
SARS Coronavirus 2 by RT PCR: NEGATIVE

## 2020-11-01 SURGERY — INCISION, PROSTATE, TRANSURETHRAL
Anesthesia: General | Site: Bladder

## 2020-11-01 MED ORDER — SODIUM CHLORIDE 0.9 % IR SOLN
Status: DC | PRN
  Administered 2020-11-01 (×2): 3000 mL

## 2020-11-01 MED ORDER — CIPROFLOXACIN IN D5W 400 MG/200ML IV SOLN
INTRAVENOUS | Status: AC
  Filled 2020-11-01: qty 200

## 2020-11-01 MED ORDER — GLYCOPYRROLATE PF 0.2 MG/ML IJ SOSY
PREFILLED_SYRINGE | INTRAMUSCULAR | Status: AC
  Filled 2020-11-01: qty 1

## 2020-11-01 MED ORDER — DEXAMETHASONE SODIUM PHOSPHATE 10 MG/ML IJ SOLN
INTRAMUSCULAR | Status: AC
  Filled 2020-11-01: qty 1

## 2020-11-01 MED ORDER — PROPOFOL 10 MG/ML IV BOLUS
INTRAVENOUS | Status: DC | PRN
  Administered 2020-11-01: 100 mg via INTRAVENOUS

## 2020-11-01 MED ORDER — TRAMADOL HCL 50 MG PO TABS: 50.0000 mg | ORAL_TABLET | Freq: Four times a day (QID) | ORAL | 0 refills | Status: DC | PRN

## 2020-11-01 MED ORDER — DEXAMETHASONE SODIUM PHOSPHATE 10 MG/ML IJ SOLN
INTRAMUSCULAR | Status: DC | PRN
  Administered 2020-11-01: 10 mg via INTRAVENOUS

## 2020-11-01 MED ORDER — CIPROFLOXACIN IN D5W 400 MG/200ML IV SOLN
400.0000 mg | INTRAVENOUS | Status: AC
  Administered 2020-11-01: 400 mg via INTRAVENOUS

## 2020-11-01 MED ORDER — EPHEDRINE SULFATE-NACL 50-0.9 MG/10ML-% IV SOSY
PREFILLED_SYRINGE | INTRAVENOUS | Status: DC | PRN
  Administered 2020-11-01 (×2): 10 mg via INTRAVENOUS

## 2020-11-01 MED ORDER — GLYCOPYRROLATE PF 0.2 MG/ML IJ SOSY
PREFILLED_SYRINGE | INTRAMUSCULAR | Status: DC | PRN
  Administered 2020-11-01: .2 mg via INTRAVENOUS

## 2020-11-01 MED ORDER — LIDOCAINE HCL (PF) 2 % IJ SOLN
INTRAMUSCULAR | Status: AC
  Filled 2020-11-01: qty 5

## 2020-11-01 MED ORDER — LIDOCAINE 2% (20 MG/ML) 5 ML SYRINGE
INTRAMUSCULAR | Status: DC | PRN
  Administered 2020-11-01: 80 mg via INTRAVENOUS

## 2020-11-01 MED ORDER — OXYCODONE HCL 5 MG/5ML PO SOLN: 5.0000 mg | Freq: Once | ORAL | Status: DC | PRN

## 2020-11-01 MED ORDER — ONDANSETRON HCL 4 MG/2ML IJ SOLN: 4.0000 mg | Freq: Once | INTRAMUSCULAR | Status: DC | PRN

## 2020-11-01 MED ORDER — LACTATED RINGERS IV SOLN: INTRAVENOUS | Status: DC

## 2020-11-01 MED ORDER — AMISULPRIDE (ANTIEMETIC) 5 MG/2ML IV SOLN: 10.0000 mg | Freq: Once | INTRAVENOUS | Status: DC | PRN

## 2020-11-01 MED ORDER — ONDANSETRON HCL 4 MG/2ML IJ SOLN
INTRAMUSCULAR | Status: DC | PRN
  Administered 2020-11-01: 4 mg via INTRAVENOUS

## 2020-11-01 MED ORDER — PROPOFOL 10 MG/ML IV BOLUS
INTRAVENOUS | Status: AC
  Filled 2020-11-01: qty 20

## 2020-11-01 MED ORDER — OXYCODONE HCL 5 MG PO TABS: 5.0000 mg | ORAL_TABLET | Freq: Once | ORAL | Status: DC | PRN

## 2020-11-01 MED ORDER — FENTANYL CITRATE (PF) 100 MCG/2ML IJ SOLN
INTRAMUSCULAR | Status: DC | PRN
  Administered 2020-11-01: 25 ug via INTRAVENOUS
  Administered 2020-11-01: 50 ug via INTRAVENOUS

## 2020-11-01 MED ORDER — ONDANSETRON HCL 4 MG/2ML IJ SOLN
INTRAMUSCULAR | Status: AC
  Filled 2020-11-01: qty 2

## 2020-11-01 MED ORDER — FENTANYL CITRATE (PF) 100 MCG/2ML IJ SOLN
INTRAMUSCULAR | Status: AC
  Filled 2020-11-01: qty 2

## 2020-11-01 MED ORDER — FENTANYL CITRATE (PF) 100 MCG/2ML IJ SOLN: 25.0000 ug | INTRAMUSCULAR | Status: DC | PRN

## 2020-11-01 MED ORDER — EPHEDRINE 5 MG/ML INJ
INTRAVENOUS | Status: AC
  Filled 2020-11-01: qty 10

## 2020-11-01 SURGICAL SUPPLY — 29 items
BAG DRAIN URO-CYSTO SKYTR STRL (DRAIN) ×2 IMPLANT
BAG DRN RND TRDRP ANRFLXCHMBR (UROLOGICAL SUPPLIES) ×1
BAG DRN UROCATH (DRAIN) ×1
BAG URINE DRAIN 2000ML AR STRL (UROLOGICAL SUPPLIES) ×2 IMPLANT
CATH FOLEY 2WAY SLVR  5CC 20FR (CATHETERS) ×2
CATH FOLEY 2WAY SLVR 30CC 24FR (CATHETERS) IMPLANT
CATH FOLEY 2WAY SLVR 5CC 20FR (CATHETERS) ×1 IMPLANT
CATH FOLEY 3WAY 30CC 22FR (CATHETERS) ×2 IMPLANT
ELECT HOOK LOOP BIPOLAR (NEEDLE) ×2 IMPLANT
ELECT REM PT RETURN 9FT ADLT (ELECTROSURGICAL) ×2
ELECTRODE REM PT RTRN 9FT ADLT (ELECTROSURGICAL) ×1 IMPLANT
EVACUATOR MICROVAS BLADDER (UROLOGICAL SUPPLIES) IMPLANT
GLOVE BIO SURGEON STRL SZ7.5 (GLOVE) ×2 IMPLANT
GLOVE BIOGEL PI IND STRL 7.0 (GLOVE) ×2 IMPLANT
GLOVE BIOGEL PI INDICATOR 7.0 (GLOVE) ×2
GOWN STRL REUS W/TWL LRG LVL3 (GOWN DISPOSABLE) ×2 IMPLANT
GOWN STRL REUS W/TWL XL LVL3 (GOWN DISPOSABLE) ×2 IMPLANT
HOLDER FOLEY CATH W/STRAP (MISCELLANEOUS) ×2 IMPLANT
IV NS IRRIG 3000ML ARTHROMATIC (IV SOLUTION) ×4 IMPLANT
KIT TURNOVER CYSTO (KITS) ×2 IMPLANT
LOOP CUT BIPOLAR 24F LRG (ELECTROSURGICAL) IMPLANT
MANIFOLD NEPTUNE II (INSTRUMENTS) ×2 IMPLANT
PACK CYSTO (CUSTOM PROCEDURE TRAY) ×2 IMPLANT
SYR 30ML LL (SYRINGE) IMPLANT
SYR TOOMEY IRRIG 70ML (MISCELLANEOUS)
SYRINGE TOOMEY IRRIG 70ML (MISCELLANEOUS) IMPLANT
TUBE CONNECTING 12X1/4 (SUCTIONS) ×2 IMPLANT
TUBING UROLOGY SET (TUBING) IMPLANT
WATER STERILE IRR 500ML POUR (IV SOLUTION) ×2 IMPLANT

## 2020-11-01 NOTE — Anesthesia Postprocedure Evaluation (Signed)
Anesthesia Post Note  Patient: MARLON SULEIMAN  Procedure(s) Performed: TRANSURETHRAL INCISION OF THE BLADDER NECK (N/A Bladder)     Patient location during evaluation: PACU Anesthesia Type: General Level of consciousness: awake and alert Pain management: pain level controlled Vital Signs Assessment: post-procedure vital signs reviewed and stable Respiratory status: spontaneous breathing, nonlabored ventilation and respiratory function stable Cardiovascular status: blood pressure returned to baseline and stable Postop Assessment: no apparent nausea or vomiting Anesthetic complications: no   No complications documented.  Last Vitals:  Vitals:   11/01/20 1430 11/01/20 1445  BP: (!) 119/48 121/62  Pulse: 71 65  Resp: 15 13  Temp:    SpO2: 100% 97%    Last Pain:  Vitals:   11/01/20 1445  TempSrc:   PainSc: 0-No pain                 Audry Pili

## 2020-11-01 NOTE — H&P (Signed)
#1 history of bladder tumor. This was either a low-grade urothelial carcinoma versus papillary urothelial neoplasm of low malignant potential - 2015.  Cysto:  11/18:NED  3/18 - tight bladder neck, evidence of radiation changes, NED  8/16 - neg  01/2015- negative - tight bladder neck  05/2014 - negative   #2 bladder neck contracture. Underwent dilation with balloon and cold knife incision of bladder neck contracture 05/30/14. Also noted to have distal urethral stricture. Failed initial voiding trial. Passed second one. Is using cunningham clamp for incontinence. Continues to have severe incontinence.    #3 prostate cancer -RP 2006; 4+3=7, pT2c, negative margin. BCR 04/2012 to 0.33. [facility] IMRT (Dr. Valere Dross) Sept-Nov 2013. Rectal bleeding during treatment.  PSA:  11/21: <0.015  11/20: <0.015  01/2017 - <0.015  01/09/16 - <0.01  07/03/15 - < 0.01  01/2015 - <0.01   He continues to wear the penile clamp.   Intv: The patient reports no significant trouble over the past year. He does note worsening stream or flow. He has dribbling with stream. He continues to have severe incontinence. He denies any hematuria or dysuria. He has not been treated for any infections.     ALLERGIES: Amoxicillin TABS Hydrocodone-Acetaminophen CAPS    MEDICATIONS: Aspirin 81 MG TABS Oral  Atorvastatin Calcium 40 mg tablet Oral  Carvedilol 12.5 mg tablet  Centrum Silver  Lasix 20 mg tablet tablet  Losartan Potassium 25 mg tablet  Multivitamin  Nitrostat  Sertraline Hcl 50 mg tablet  Vitamin B12     GU PSH: Cysto Dilate Stricture (M or F) - 2015 Cystoscopy - 10/19/2019, 2019, 2019, 2018 Extensive Prostate Surgery - 2008 Revise Bladder Neck - 2015       PSH Notes: Transurethral Resection Of Bladder Neck, Cystoscopy For Urethral Stricture, Heart Surgery, Cath Stent Placement, Coronary Artery Bypass Graft (CABG), Knee Surgery, Back Surgery, Knee Surgery, Hernia Repair, Prostatectomy Retropubic Radical With Nerve  Sparing   Pt broke his back, Compression fracture   NON-GU PSH: CABG (coronary artery bypass grafting) - 2008 Hernia Repair - 2008     GU PMH: Bladder-neck stenosis/contracture, Bladder neck contracture - 2017 History of bladder cancer, History of bladder cancer - 2017 History of prostate cancer, History of prostate cancer - 2017, Personal history of prostate cancer, - 2014 Bladder tumor/neoplasm, Bladder neoplasm of uncertain malignant potential - 2016 Stress Incontinence, Male stress incontinence - 2016 Prostate Cancer, Prostate cancer - 2016 Urethral Stricture, Unspec, Urethral stricture - 2016 Urinary Tract Inf, Unspec site, Urinary tract infection - 2015, Pyuria, - 2015 Dysuria, Dysuria - 2015 Benign Neo Bladder, Benign neoplasm of bladder - 2015 Weak Urinary Stream, Weak urinary stream - 2015 Nocturia, Nocturia - 2014 ED due to arterial insufficiency, Erectile dysfunction due to arterial insufficiency - 2014 Elevated PSA, Elevated prostate specific antigen (PSA) - 2014 Urinary Retention, Unspec, Urinary retention - 2014      PMH Notes: #1 history of bladder tumor. This was either a low-grade urothelial carcinoma versus papillary urothelial neoplasm of low malignant potential - 2015.  Cysto:  8/16 - neg  01/2015- negative - tight bladder neck  05/2014 - negative    #2 bladder neck contracture. Underwent dilation with balloon and cold knife incision of bladder neck contracture 05/30/14. Also noted to have distal urethral stricture. Failed initial voiding trial. Passed second one. Is using cunningham clamp for incontinence. Continues to have severe incontinence.   #3 prostate cancer -RP 2006; 4+3=7, pT2c, negative margin. BCR 04/2012 to 0.33. [facility] IMRT (Dr.  Valere Dross) Sept-Nov Mar 13, 2012. Rectal bleeding during treatment.  PSA:  01/09/16 - <0.01  07/03/15 - < 0.01  01/2015 - <0.01      NON-GU PMH: Encounter for general adult medical examination without abnormal findings, Encounter for  preventive health examination - 03/13/2016 Anal fissure, unspecified, Anal Fissure - 2013/03/13 Personal history of other diseases of the circulatory system, History of hypertension - 2013-03-13 Personal history of other diseases of the digestive system, History of esophageal reflux - 03/13/13 Personal history of other diseases of the nervous system and sense organs, History of sleep apnea - 03/13/13 Personal history of other endocrine, nutritional and metabolic disease, History of hypercholesterolemia - 2013-03-13    FAMILY HISTORY: Death In The Family Father - Father Death In The Family Mother - Mother Family Health Status Number - Runs In Family   SOCIAL HISTORY: Marital Status: Widowed Preferred Language: English; Ethnicity: Not Hispanic Or Latino; Race: White     Notes: Previous History Of Smoking, Caffeine Use, Retired From Work, Alcohol Use, Marital History - Currently Married   REVIEW OF SYSTEMS:    GU Review Male:   Patient denies frequent urination, hard to postpone urination, burning/ pain with urination, get up at night to urinate, leakage of urine, stream starts and stops, trouble starting your stream, have to strain to urinate , erection problems, and penile pain.  Gastrointestinal (Upper):   Patient denies nausea, vomiting, and indigestion/ heartburn.  Gastrointestinal (Lower):   Patient denies diarrhea and constipation.  Constitutional:   Patient denies fever, night sweats, weight loss, and fatigue.  Skin:   Patient denies skin rash/ lesion and itching.  Eyes:   Patient denies blurred vision and double vision.  Ears/ Nose/ Throat:   Patient denies sore throat and sinus problems.  Hematologic/Lymphatic:   Patient denies swollen glands and easy bruising.  Cardiovascular:   Patient denies chest pains and leg swelling.  Respiratory:   Patient denies cough and shortness of breath.  Endocrine:   Patient denies excessive thirst.  Musculoskeletal:   Patient denies back pain and joint pain.  Neurological:    Patient denies headaches and dizziness.  Psychologic:   Patient denies depression and anxiety.   VITAL SIGNS:      10/16/2020 01:36 PM  Weight 134 lb / 60.78 kg  Height 68.5 in / 173.99 cm  BP 137/68 mmHg  Pulse 58 /min  BMI 20.1 kg/m   Complexity of Data:  Source Of History:  Patient  Lab Test Review:   PSA  Records Review:   Pathology Reports, Previous Doctor Records, Previous Patient Records  Urine Test Review:   Urinalysis   10/09/20 10/14/19 10/15/18 02/02/18 01/15/17 01/10/16 07/04/15 01/03/15  PSA  Total PSA <0.015 ng/mL <0.015 ng/mL <0.015 ng/mL <0.015 ng/mL < 0.015 ng/dl <0.01  <0.01  <0.01     PROCEDURES:         Flexible Cystoscopy - 52000  Risks, benefits, and some of the potential complications of the procedure were discussed at length with the patient including infection, bleeding, voiding discomfort, urinary retention, fever, chills, sepsis, and others. All questions were answered. Informed consent was obtained. Sterile technique and intraurethral analgesia were used.  Meatus:  Normal size. Normal location. Normal condition.  Urethra:  Moderate membranous stricture.  External Sphincter:  Normal.  Verumontanum:  Verumontanum Surgically Absent.  Prostate:  Prostate Surgically Absent.  Bladder Neck:  Severe bladder neck contracture. 32F   Ureteral Orifices:  Normal location. Normal size. Normal shape. Effluxed clear urine.  Bladder:  No trabeculation. No tumors. Normal mucosa. No stones.      The lower urinary tract was carefully examined. The procedure was well-tolerated and without complications. Antibiotic instructions were given. Instructions were given to call the office immediately for bloody urine, difficulty urinating, urinary retention, painful or frequent urination, fever, chills, nausea, vomiting or other illness. The patient stated that he understood these instructions and would comply with them.         Urinalysis w/Scope Dipstick Dipstick Cont'd Micro   Color: Yellow Bilirubin: Neg mg/dL WBC/hpf: NS (Not Seen)  Appearance: Clear Ketones: Neg mg/dL RBC/hpf: 0 - 2/hpf  Specific Gravity: 1.025 Blood: 1+ ery/uL Bacteria: NS (Not Seen)  pH: 5.5 Protein: Neg mg/dL Cystals: NS (Not Seen)  Glucose: Neg mg/dL Urobilinogen: 0.2 mg/dL Casts: Hyaline    Nitrites: Neg Trichomonas: Not Present    Leukocyte Esterase: Neg leu/uL Mucous: Not Present      Epithelial Cells: 0 - 5/hpf      Yeast: NS (Not Seen)      Sperm: Not Present         Urinalysis - 81003 Dipstick Dipstick Cont'd Micro  Color: Yellow Bilirubin: Neg WBC/hpf: NS (Not Seen)  Appearance: Clear Ketones: Neg RBC/hpf: 0 - 2/hpf  Specific Gravity: 1.025 Blood: 1+ Bacteria: NS (Not Seen)  pH: 5.5 Protein: Neg Cystals: NS (Not Seen)  Glucose: Neg Urobilinogen: 0.2 Casts: Hyaline    Nitrites: Neg Trichomonas: Not Present    Leukocyte Esterase: Neg Mucous: Not Present      Epithelial Cells: 0 - 5/hpf      Yeast: NS (Not Seen)      Sperm: Not Present    ASSESSMENT:      ICD-10 Details  1 GU:   Bladder tumor/neoplasm - D41.4   2   Bladder-neck stenosis/contracture - N32.0   3   History of prostate cancer - Z85.46   4   Stress Incontinence - N39.3      PLAN:           Document Letter(s):  Created for Patient: Clinical Summary         Notes:   The patient continues to have some weak stream and incomplete emptying from his bladder neck stenosis. However, he is not quite to the point where he would like to consider re-opening this under anesthesia with a cold knife. If he gets worse, he will certainly contact us. Fortunately, there is no evidence of recurrence of his bladder cancer.   He continues have stress urinary incontinence and is using a penile clamp for this. He is quite happy with the symptoms currently.   The patient has a PSA that is undetectable demonstrating excellent control his prostate cancer.   Will continue to follow the patient annually.

## 2020-11-01 NOTE — Anesthesia Procedure Notes (Signed)
Procedure Name: LMA Insertion Date/Time: 11/01/2020 12:58 PM Performed by: Rogers Blocker, CRNA Pre-anesthesia Checklist: Patient identified, Emergency Drugs available, Suction available and Patient being monitored Patient Re-evaluated:Patient Re-evaluated prior to induction Oxygen Delivery Method: Circle System Utilized Preoxygenation: Pre-oxygenation with 100% oxygen Induction Type: IV induction Ventilation: Mask ventilation without difficulty LMA: LMA inserted LMA Size: 5.0 Number of attempts: 1 Placement Confirmation: positive ETCO2 Tube secured with: Tape Dental Injury: Teeth and Oropharynx as per pre-operative assessment

## 2020-11-01 NOTE — Op Note (Signed)
Preoperative diagnosis:  1. Bladder neck stenosis  Postoperative diagnosis:  1. Same 2. Weak urinary stream and incomplete bladder emptying  Procedure: 1. Transurethral incision of the bladder neck  Surgeon: Ardis Hughs, MD  Anesthesia: General  Complications: None  Intraoperative findings: The patient's bladder neck was significantly stenosed and contracted, and was opened widely with incisions at 5 and 7:00.  EBL: Minimal  Specimens: None  Indication: [name] is a 84 y.o. patient with history of prostate cancer who had his prostate removed many years ago.  He has had difficulty with bladder emptying as well as urinary incontinence.  After reviewing the management options for treatment, he elected to proceed with the above surgical procedure(s). We have discussed the potential benefits and risks of the procedure, side effects of the proposed treatment, the likelihood of the patient achieving the goals of the procedure, and any potential problems that might occur during the procedure or recuperation. Informed consent has been obtained.  Description of procedure:  The patient was taken to the operating room and general anesthesia was induced.  The patient was placed in the dorsal lithotomy position, prepped and draped in the usual sterile fashion, and preoperative antibiotics were administered. A preoperative time-out was performed.   A 21 French 30 degree cystoscope was gently passed to the patient's urethra and into the proximal urethra under visual guidance.  I was unable to advance the scope beyond this area.  At this point I removed the cystoscope and advanced a 24 French 30 degrees cystoscope with the visual obturator to the proximal urethra.  I exchanged the visual obturator for a Collins knife.  Subsequently advanced the knife into the bladder under visual guidance and made incisions at 5 and 7 down through the bladder neck several centimeters.  The bladder neck  popped open nicely.  I then advanced the scope into the patient's bladder and perform cystoscopy which demonstrated no significant or abnormal findings.  The UOs were orthotopic.  I cauterized several bleeding vessels and hemostasis was otherwise noted to be quite good.  Advanced a 57 French Foley catheter into the patient's bladder and inflated the balloon with 10 cc of sterile water.  Foley catheter was left to gravity drainage and the patient was subsequently extubated return the PACU in stable condition.  Disposition: The patient is being discharged home and will be given a voiding trial in clinic on December 7.  Ardis Hughs, M.D.

## 2020-11-01 NOTE — Transfer of Care (Signed)
Immediate Anesthesia Transfer of Care Note  Patient: [name]  Procedure(s) Performed: TRANSURETHRAL INCISION OF THE BLADDER NECK (N/A Bladder)  Patient Location: PACU  Anesthesia Type:General  Level of Consciousness: drowsy and patient cooperative  Airway & Oxygen Therapy: Patient Spontanous Breathing  Post-op Assessment: Report given to RN and Post -op Vital signs reviewed and stable  Post vital signs: Reviewed and stable  Last Vitals:  Vitals Value Taken Time  BP 138/62 11/01/20 1355  Temp 36.8 C 11/01/20 1355  Pulse 74 11/01/20 1356  Resp 11 11/01/20 1356  SpO2 97 % 11/01/20 1356  Vitals shown include unvalidated device data.  Last Pain:  Vitals:   11/01/20 1016  TempSrc: Oral  PainSc: 1       Patients Stated Pain Goal: 4 (15/72/62 0355)  Complications: No complications documented.

## 2020-11-01 NOTE — Interval H&P Note (Signed)
History and Physical Interval Note:  11/01/2020 12:07 PM  [name]  has presented today for surgery, with the diagnosis of [place].  The various methods of treatment have been discussed with the patient and family. After consideration of risks, benefits and other options for treatment, the patient has consented to  Procedure(s): [place] (N/A) as a surgical intervention.  The patient's history has been reviewed, patient examined, no change in status, stable for surgery.  I have reviewed the patient's chart and labs.  Questions were answered to the patient's satisfaction.     Ardis Hughs

## 2020-11-01 NOTE — Anesthesia Preprocedure Evaluation (Addendum)
Anesthesia Evaluation  Patient identified by MRN, date of birth, ID band Patient awake    Reviewed: Allergy & Precautions, NPO status , Patient's Chart, lab work & pertinent test results, reviewed documented beta blocker date and time   History of Anesthesia Complications Negative for: history of anesthetic complications  Airway Mallampati: I  TM Distance: >3 FB Neck ROM: Full    Dental  (+) Teeth Intact   Pulmonary neg pulmonary ROS, former smoker,    Pulmonary exam normal        Cardiovascular hypertension, Pt. on home beta blockers and Pt. on medications + CAD, + CABG (2014) and + Peripheral Vascular Disease  Normal cardiovascular exam  4.4 cm infrarenal AAA   Neuro/Psych negative neurological ROS     GI/Hepatic Neg liver ROS, hiatal hernia, GERD  ,  Endo/Other  negative endocrine ROS  Renal/GU Renal InsufficiencyRenal disease (CKD; Cr 1.40)   Bladder neck contracture H/o prostate cancer    Musculoskeletal  (+) Arthritis , Osteoarthritis,    Abdominal   Peds  Hematology negative hematology ROS (+)   Anesthesia Other Findings  Echo 2019: EF 45-50%, mild MR, mild TR, PASP 32  Stress test 2019: Myocardial perfusion is normal. The study is normal. This is a low risk study. Overall left ventricular systolic function was normal. LV cavity size is normal. Nuclear stress EF: 61%. The left ventricular ejection fraction is normal (55-65%). There are no significant changes in comparison to the prior study.  Last cardiology visit August 2021, stable at that visit AAA followed by Dr. Scot Dock  Reproductive/Obstetrics                            Anesthesia Physical Anesthesia Plan  ASA: III  Anesthesia Plan: General   Post-op Pain Management:    Induction: Intravenous  PONV Risk Score and Plan: 2 and Ondansetron, Dexamethasone, Midazolam and Treatment may vary due to age or medical  condition  Airway Management Planned: LMA  Additional Equipment: None  Intra-op Plan:   Post-operative Plan: Extubation in OR  Informed Consent: I have reviewed the patients History and Physical, chart, labs and discussed the procedure including the risks, benefits and alternatives for the proposed anesthesia with the patient or authorized representative who has indicated his/her understanding and acceptance.     Dental advisory given  Plan Discussed with:   Anesthesia Plan Comments:        Anesthesia Quick Evaluation

## 2020-11-01 NOTE — Discharge Instructions (Signed)

## 2020-11-02 ENCOUNTER — Encounter (HOSPITAL_BASED_OUTPATIENT_CLINIC_OR_DEPARTMENT_OTHER): Payer: Self-pay | Admitting: Urology

## 2020-11-07 DIAGNOSIS — N320 Bladder-neck obstruction: Secondary | ICD-10-CM | POA: Diagnosis not present

## 2020-11-07 DIAGNOSIS — N393 Stress incontinence (female) (male): Secondary | ICD-10-CM | POA: Diagnosis not present

## 2020-11-09 DIAGNOSIS — N393 Stress incontinence (female) (male): Secondary | ICD-10-CM | POA: Diagnosis not present

## 2020-11-09 DIAGNOSIS — N320 Bladder-neck obstruction: Secondary | ICD-10-CM | POA: Diagnosis not present

## 2020-11-09 DIAGNOSIS — Z8546 Personal history of malignant neoplasm of prostate: Secondary | ICD-10-CM | POA: Diagnosis not present

## 2020-11-09 DIAGNOSIS — H524 Presbyopia: Secondary | ICD-10-CM | POA: Diagnosis not present

## 2020-11-09 DIAGNOSIS — Z961 Presence of intraocular lens: Secondary | ICD-10-CM | POA: Diagnosis not present

## 2020-11-09 DIAGNOSIS — R311 Benign essential microscopic hematuria: Secondary | ICD-10-CM | POA: Diagnosis not present

## 2020-11-09 DIAGNOSIS — H401131 Primary open-angle glaucoma, bilateral, mild stage: Secondary | ICD-10-CM | POA: Diagnosis not present

## 2020-11-09 DIAGNOSIS — H18513 Endothelial corneal dystrophy, bilateral: Secondary | ICD-10-CM | POA: Diagnosis not present

## 2020-11-22 ENCOUNTER — Encounter: Payer: Self-pay | Admitting: Family Medicine

## 2020-11-22 ENCOUNTER — Ambulatory Visit (INDEPENDENT_AMBULATORY_CARE_PROVIDER_SITE_OTHER): Payer: Medicare HMO | Admitting: Family Medicine

## 2020-11-22 ENCOUNTER — Other Ambulatory Visit: Payer: Self-pay

## 2020-11-22 VITALS — BP 120/66 | HR 55 | Temp 97.6°F | Ht 68.0 in | Wt 136.4 lb

## 2020-11-22 DIAGNOSIS — R300 Dysuria: Secondary | ICD-10-CM

## 2020-11-22 LAB — POCT URINALYSIS DIPSTICK
Bilirubin, UA: NEGATIVE
Glucose, UA: NEGATIVE
Ketones, UA: NEGATIVE
Nitrite, UA: NEGATIVE
Protein, UA: NEGATIVE
Spec Grav, UA: 1.015 (ref 1.010–1.025)
Urobilinogen, UA: 0.2 E.U./dL
pH, UA: 6 (ref 5.0–8.0)

## 2020-11-22 MED ORDER — CEPHALEXIN 500 MG PO CAPS: 500.0000 mg | ORAL_CAPSULE | Freq: Three times a day (TID) | ORAL | 0 refills | Status: DC

## 2020-11-22 NOTE — Progress Notes (Signed)
Established Patient Office Visit  Subjective:  Patient ID: [name], male    DOB: [birth year]  Age: 84 y.o. MRN: [id number]  CC:  Chief Complaint  Patient presents with  . Penis Pain    Discomfort in area where patient was catheterized, no blood or burning      HPI [name] presents for some discomfort posterior to the scrotum.  History is that early December had transurethral incision of bladder neck for urinary difficulties.  He did relatively well following surgery.  He had Foley catheter out about a week later.  He passed some initial clots but none since then.  He actually does not have any significant burning with urination right now.  No fevers or chills.  His discomfort is really more posterior to the scrotal region but no testicular pain.  He has had prior TURP for prostate cancer several years ago.  Has had some chronic bladder incontinence.  Past Medical History:  Diagnosis Date  . AAA (abdominal aortic aneurysm) ([place])   . AAA (abdominal aortic aneurysm) ([facility])    4-4 cm intrarenal aaa per US aorta 03-28-2019 epic  . Benign positional vertigo   . Bladder cancer ([place])   . Bladder neck contracture   . Coronary artery disease CARDIOLOGIST--  DR Acie Fredrickson  . ED (erectile dysfunction)   . GERD (gastroesophageal reflux disease)   . H/O hiatal hernia   . History of anal fissures   . History of colon polyps   . History of radiation therapy 08/24/12-10/22/12   prostate 6600cGy/33 sessions--  for biochemical recurrence  . Hyperlipidemia   . Hypertension   . OA (osteoarthritis)   . Peripheral vascular disease ([place])   . Recurrent prostate adenocarcinoma ([place]) first dx 09/2005--  radial prostatectomy-- gleason 4+3=7, pT2c, negative margin   04/2012--  BIOCHEMICAL  RECURRENCE to 0.33 /  SALVAGE IMRT SEPT to  NOV 2013  . S/P CABG x 2    09/  2014  . S/P dilatation of esophageal stricture    1992  . Sciatica    both hips at times mostly left hip  . Sigmoid  diverticulosis   . SUI (stress urinary incontinence), male   . Urethral stricture   . Wears glasses     Past Surgical History:  Procedure Laterality Date  . CARDIAC CATHETERIZATION  08-27-2013  DR Martinique   CRITICAL OSTIAL LM STENOSIS/  diffuse disease LAD 40-50%  &   pLCx 30-40%/  . CARDIOVASCULAR STRESS TEST  08-18-2013  DR NASHER   SMALL/ MEDIUM AREA MILD SCAR INFEROSEPTAL WALL/ NO ISCHEMIA/  LOW RISK SCAN/  EF 62%/  LV WALL MOTION WITH SLIGHT DECREASED OF THE SEPTUM  . CORONARY ANGIOPLASTY  1991   PTCA  of LEFT CFX  . CORONARY ARTERY BYPASS GRAFT N/A 08/27/2013   Procedure: CORONARY ARTERY BYPASS GRAFTING (CABG) times two using left internal mammary and right saphenous vein using endoscope.;  Surgeon: Melrose Nakayama, MD;  Location: [facility];  Service: Open Heart Surgery;  Laterality: N/A;  . CYSTOSCOPY WITH URETHRAL DILATATION N/A 01/26/2014   Procedure: CYSTOSCOPY WITH URETHRAL DILATATION, BLADDER BIOPSY, FOLEY CATHETER;  Surgeon: Molli Hazard, MD;  [facility];  Service: Urology;  Laterality: N/A;  . INGUINAL HERNIA REPAIR Bilateral 1994  . KNEE ARTHROSCOPY Right yrs ago  . LEFT HEART CATHETERIZATION WITH CORONARY ANGIOGRAM N/A 08/27/2013   Procedure: LEFT HEART CATHETERIZATION WITH CORONARY ANGIOGRAM;  Surgeon: Peter M Martinique, MD;  Location: [facility] CATH LAB;  Service: Cardiovascular;  Laterality: N/A;  . LUMBAR DISC SURGERY  1980'S  . RADICAL RETROPUBIC PROSTATECTOMY W/ BILATERAL PELVIC NODE DISSECTION  11-18-2005  . TRANSURETHRAL INCISION OF PROSTATE N/A 11/01/2020   Procedure: TRANSURETHRAL INCISION OF THE BLADDER NECK;  Surgeon: Ardis Hughs, MD;  Location: [facility];  Service: Urology;  Laterality: N/A;  . URETHROTOMY N/A 05/30/2014   Procedure: CYSTOSCOPY  BALLOON DILATION AND DIRECT VISION INTERNAL URETHROTOMY;  Surgeon: Sharyn Creamer, MD;  Location[facility];  Service: Urology;  Laterality: N/A;    Family History   Problem Relation Age of Onset  . Cancer Father        prostate died 55 something  . Cancer Brother        prostate cancer  . Arthritis Other   . Hyperlipidemia Other   . Hypertension Other     Social History   Socioeconomic History  . Marital status: Single    Spouse name: Not on file  . Number of children: 2  . Years of education: 50  . Highest education level: Not on file  Occupational History  . Occupation: Retired    Fish farm manager: RETIRED  Tobacco Use  . Smoking status: Former Smoker    Packs/day: 2.00    Years: 10.00    Pack years: 20.00    Types: Cigarettes    Quit date: 12/03/1994    Years since quitting: 25.9  . Smokeless tobacco: Never Used  Vaping Use  . Vaping Use: Never used  Substance and Sexual Activity  . Alcohol use: Yes    Comment: occasionally  . Drug use: No  . Sexual activity: Not on file  Other Topics Concern  . Not on file  Social History Narrative   Lives alone   Caffeine use: Coffee- 3 cups daily   Soda sometimes.    Social Determinants of Health   Financial Resource Strain: Not on file  Food Insecurity: Not on file  Transportation Needs: Not on file  Physical Activity: Not on file  Stress: Not on file  Social Connections: Not on file  Intimate Partner Violence: Not on file    Outpatient Medications Prior to Visit  Medication Sig Dispense Refill  . aspirin 81 MG EC tablet Take 1 tablet (81 mg total) by mouth daily.    [name] atorvastatin (LIPITOR) 40 MG tablet Take one tablet by mouth once daily. 90 tablet 3  . carvedilol (COREG) 12.5 MG tablet TAKE 1 TABLET BY MOUTH TWICE A DAY 180 tablet 2  . famotidine (PEPCID) 20 MG tablet Take 20 mg by mouth as needed for heartburn or indigestion.    . furosemide (LASIX) 20 MG tablet Take 1 tablet (20 mg total) by mouth 3 (three) times a week. On Monday, Wednesday, Friday 45 tablet 3  . latanoprost (XALATAN) 0.005 % ophthalmic solution Place 1 drop into both eyes at bedtime.     [name] losartan (COZAAR) 25  MG tablet TAKE 1 TABLET BY MOUTH EVERY DAY 90 tablet 3  . Multiple Vitamin (MULTIVITAMIN WITH MINERALS) TABS tablet Take 1 tablet by mouth daily.    . nitroGLYCERIN (NITROSTAT) 0.4 MG SL tablet PLACE 1 TABLET UNDER THE TONUE EVERY 5 MINUTES AS NEEDED FOR CHEST PAIN. 75 tablet 3  . polyethylene glycol (MIRALAX / GLYCOLAX) packet Take 17 g by mouth daily as needed (for constipation).     . sertraline (ZOLOFT) 50 MG tablet TAKE 1 TABLET BY MOUTH EVERY DAY (Patient taking differently: daily.) 90  tablet 0  . traMADol (ULTRAM) 50 MG tablet Take 1-2 tablets (50-100 mg total) by mouth every 6 (six) hours as needed for moderate pain. 15 tablet 0  . Turmeric (QC TUMERIC COMPLEX PO) Take 1 tablet by mouth daily.    . vitamin B-12 (CYANOCOBALAMIN) 100 MCG tablet Take 100 mcg by mouth daily.     No facility-administered medications prior to visit.    Allergies  Allergen Reactions  . Bee Venom Other (See Comments)    Reaction swelling  . Amoxicillin Rash  . Hydrocodone Other (See Comments)    REACTION: disorientation    ROS Review of Systems  Constitutional: Negative for chills and fever.  Respiratory: Negative for cough.   Genitourinary: Negative for dysuria and hematuria.      Objective:    Physical Exam Vitals reviewed.  Constitutional:      Appearance: Normal appearance.  Cardiovascular:     Rate and Rhythm: Normal rate and regular rhythm.  Pulmonary:     Effort: Pulmonary effort is normal.     Breath sounds: Normal breath sounds.  Genitourinary:    Comments: He has a clamp on his penis which he wears chronically.  Scrotal exam is unremarkable.  Testes are normal.  No tenderness.  We examined the area up posterior to the scrotal region and there is no evidence for visible swelling or erythema.  No masses palpated. Neurological:     Mental Status: He is alert.     BP 120/66   Pulse (!) 55   Temp 97.6 F (36.4 C) (Oral)   Ht 5\' 8"  (1.727 m)   Wt 136 lb 6.4 oz (61.9 kg)   SpO2  98%   BMI 20.74 kg/m  Wt Readings from Last 3 Encounters:  11/22/20 136 lb 6.4 oz (61.9 kg)  11/01/20 131 lb 3.2 oz (59.5 kg)  10/17/20 138 lb (62.6 kg)     There are no preventive care reminders to display for this patient.  There are no preventive care reminders to display for this patient.  Lab Results  Component Value Date   TSH 0.93 10/28/2012   Lab Results  Component Value Date   WBC 6.2 05/12/2017   HGB 12.9 (L) 11/01/2020   HCT 38.0 (L) 11/01/2020   MCV 104.0 (H) 05/12/2017   PLT 160.0 05/12/2017   Lab Results  Component Value Date   NA 144 11/01/2020   K 4.8 11/01/2020   CO2 24 08/22/2020   GLUCOSE 94 11/01/2020   BUN 28 (H) 11/01/2020   CREATININE 1.40 (H) 11/01/2020   BILITOT 1.0 02/01/2020   ALKPHOS 67 02/01/2020   AST 21 02/01/2020   ALT 21 02/01/2020   PROT 6.9 02/01/2020   ALBUMIN 4.1 02/01/2020   CALCIUM 9.4 08/22/2020   GFR 47.34 (L) 02/01/2020   Lab Results  Component Value Date   CHOL 108 02/01/2020   Lab Results  Component Value Date   HDL 42.80 02/01/2020   Lab Results  Component Value Date   LDLCALC 42 02/01/2020   Lab Results  Component Value Date   TRIG 116.0 02/01/2020   Lab Results  Component Value Date   CHOLHDL 3 02/01/2020   No results found for: HGBA1C    Assessment & Plan:   Patient relates some vague discomfort posterior to the scrotal region following recent transurethral incision of bladder neck.  His urine flow is good at this time.  Urine dipstick was obtained which shows 2+ blood and 1+ leukocytes.  Does  not have any fevers or chills and nontoxic in appearance.  Exam is basically unremarkable with no visible swelling or erythema  -Urine culture sent -Stay well-hydrated -Keflex 500 mg 3 times daily for 7 days pending culture results -Follow-up with urologist if symptoms persist  Meds ordered this encounter  Medications  . cephALEXin (KEFLEX) 500 MG capsule    Sig: Take 1 capsule (500 mg total) by mouth 3  (three) times daily.    Dispense:  21 capsule    Refill:  0    Follow-up: No follow-ups on file.    [name], MD

## 2020-11-22 NOTE — Patient Instructions (Signed)
We are sending urine for culture  Start the antibiotic  Stay well hydrated  Follow up for any fever or other concerns.

## 2020-11-23 DIAGNOSIS — R300 Dysuria: Secondary | ICD-10-CM | POA: Diagnosis not present

## 2020-11-23 NOTE — Addendum Note (Signed)
Addended by: Janann Colonel on: 11/23/2020 06:57 AM   Modules accepted: Orders

## 2020-11-23 NOTE — Addendum Note (Signed)
Addended by: Janann Colonel on: 11/23/2020 06:56 AM   Modules accepted: Orders

## 2020-11-24 LAB — URINE CULTURE
MICRO NUMBER:: 11353182
Result:: NO GROWTH
SPECIMEN QUALITY:: ADEQUATE

## 2020-12-11 ENCOUNTER — Other Ambulatory Visit: Payer: Self-pay | Admitting: Cardiovascular Disease

## 2020-12-11 DIAGNOSIS — R311 Benign essential microscopic hematuria: Secondary | ICD-10-CM | POA: Diagnosis not present

## 2020-12-11 DIAGNOSIS — N320 Bladder-neck obstruction: Secondary | ICD-10-CM | POA: Diagnosis not present

## 2020-12-11 DIAGNOSIS — N393 Stress incontinence (female) (male): Secondary | ICD-10-CM | POA: Diagnosis not present

## 2020-12-20 ENCOUNTER — Encounter: Payer: Self-pay | Admitting: Family Medicine

## 2020-12-20 ENCOUNTER — Other Ambulatory Visit: Payer: Self-pay

## 2020-12-20 ENCOUNTER — Ambulatory Visit (INDEPENDENT_AMBULATORY_CARE_PROVIDER_SITE_OTHER): Payer: Medicare HMO | Admitting: Family Medicine

## 2020-12-20 VITALS — BP 122/70 | HR 54 | Ht 68.0 in | Wt 137.0 lb

## 2020-12-20 DIAGNOSIS — K6289 Other specified diseases of anus and rectum: Secondary | ICD-10-CM | POA: Diagnosis not present

## 2020-12-20 DIAGNOSIS — K5900 Constipation, unspecified: Secondary | ICD-10-CM

## 2020-12-20 NOTE — Patient Instructions (Signed)
Constipation, Adult Constipation is when a person has fewer than three bowel movements in a week, has difficulty having a bowel movement, or has stools (feces) that are dry, hard, or larger than normal. Constipation may be caused by an underlying condition. It may become worse with age if a person takes certain medicines and does not take in enough fluids. Follow these instructions at home: Eating and drinking  Eat foods that have a lot of fiber, such as beans, whole grains, and fresh fruits and vegetables.  Limit foods that are low in fiber and high in fat and processed sugars, such as fried or sweet foods. These include french fries, hamburgers, cookies, candies, and soda.  Drink enough fluid to keep your urine pale yellow.   General instructions  Exercise regularly or as told by your health care provider. Try to do 150 minutes of moderate exercise each week.  Use the bathroom when you have the urge to go. Do not hold it in.  Take over-the-counter and prescription medicines only as told by your health care provider. This includes any fiber supplements.  During bowel movements: ? Practice deep breathing while relaxing the lower abdomen. ? Practice pelvic floor relaxation.  Watch your condition for any changes. Let your health care provider know about them.  Keep all follow-up visits as told by your health care provider. This is important. Contact a health care provider if:  You have pain that gets worse.  You have a fever.  You do not have a bowel movement after 4 days.  You vomit.  You are not hungry or you lose weight.  You are bleeding from the opening between the buttocks (anus).  You have thin, pencil-like stools. Get help right away if:  You have a fever and your symptoms suddenly get worse.  You leak stool or have blood in your stool.  Your abdomen is bloated.  You have severe pain in your abdomen.  You feel dizzy or you faint. Summary  Constipation is  when a person has fewer than three bowel movements in a week, has difficulty having a bowel movement, or has stools (feces) that are dry, hard, or larger than normal.  Eat foods that have a lot of fiber, such as beans, whole grains, and fresh fruits and vegetables.  Drink enough fluid to keep your urine pale yellow.  Take over-the-counter and prescription medicines only as told by your health care provider. This includes any fiber supplements. This information is not intended to replace advice given to you by your health care provider. Make sure you discuss any questions you have with your health care provider. Document Revised: 10/06/2019 Document Reviewed: 10/06/2019 Elsevier Patient Education  2021 [place].  I do not see any evidence for anal fissure or external hemorrhoid.  Follow up with urology if symptoms persist.

## 2020-12-20 NOTE — Progress Notes (Signed)
Established Patient Office Visit  Subjective:  Patient ID: [name], male    DOB: 04/04/1936  Age: 85 y.o. MRN: [id number]  CC:  Chief Complaint  Patient presents with  . Rectal Pain  . Testicle Pain    HPI [name] presents for recent pain perineum area. He states onset was after he had transurethral incision of the bladder neck December 1 per urology. He had catheter for some time afterwards and at this point his catheter is out. He has a penile clamp which she has used for some time. He has remote history of TURP for bladder cancer. Urine flow is good at this time.  He relates several weeks now of perineum and perianal pain. He does have intermittent constipation but not consistently. No recent bloody stools. No pain with stools. Takes MiraLAX intermittently for constipation. His urologist had advised him to take ibuprofen 2 to 400 mg but has not seen any improvement in pain with that. Denies any low back pain. No burning with urination. He has had urologist obtain UA as well as here with no signs of infection. No clear exacerbating factors. No alleviating factors. He has follow-up with urology this Tuesday.  Past Medical History:  Diagnosis Date  . AAA (abdominal aortic aneurysm) ([place])   . AAA (abdominal aortic aneurysm) ([facility])    4-4 cm intrarenal aaa per US aorta 03-28-2019 epic  . Benign positional vertigo   . Bladder cancer ([place])   . Bladder neck contracture   . Coronary artery disease CARDIOLOGIST--  DR Acie Fredrickson  . ED (erectile dysfunction)   . GERD (gastroesophageal reflux disease)   . H/O hiatal hernia   . History of anal fissures   . History of colon polyps   . History of radiation therapy 08/24/12-10/22/12   prostate 6600cGy/33 sessions--  for biochemical recurrence  . Hyperlipidemia   . Hypertension   . OA (osteoarthritis)   . Peripheral vascular disease ([place])   . Recurrent prostate adenocarcinoma ([place]) first dx 09/2005--  radial prostatectomy-- gleason  4+3=7, pT2c, negative margin   04/2012--  BIOCHEMICAL  RECURRENCE to 0.33 /  SALVAGE IMRT SEPT to  NOV 2013  . S/P CABG x 2    09/  2014  . S/P dilatation of esophageal stricture    1992  . Sciatica    both hips at times mostly left hip  . Sigmoid diverticulosis   . SUI (stress urinary incontinence), male   . Urethral stricture   . Wears glasses     Past Surgical History:  Procedure Laterality Date  . CARDIAC CATHETERIZATION  08-27-2013  DR Martinique   CRITICAL OSTIAL LM STENOSIS/  diffuse disease LAD 40-50%  &   pLCx 30-40%/  . CARDIOVASCULAR STRESS TEST  08-18-2013  DR NASHER   SMALL/ MEDIUM AREA MILD SCAR INFEROSEPTAL WALL/ NO ISCHEMIA/  LOW RISK SCAN/  EF 62%/  LV WALL MOTION WITH SLIGHT DECREASED OF THE SEPTUM  . CORONARY ANGIOPLASTY  1991   PTCA  of LEFT CFX  . CORONARY ARTERY BYPASS GRAFT N/A 08/27/2013   Procedure: CORONARY ARTERY BYPASS GRAFTING (CABG) times two using left internal mammary and right saphenous vein using endoscope.;  Surgeon: Melrose Nakayama, MD;  Location: [facility];  Service: Open Heart Surgery;  Laterality: N/A;  . CYSTOSCOPY WITH URETHRAL DILATATION N/A 01/26/2014   Procedure: CYSTOSCOPY WITH URETHRAL DILATATION, BLADDER BIOPSY, FOLEY CATHETER;  Surgeon: Molli Hazard, MD;  [facility];  Service: Urology;  Laterality: N/A;  .  INGUINAL HERNIA REPAIR Bilateral 1994  . KNEE ARTHROSCOPY Right yrs ago  . LEFT HEART CATHETERIZATION WITH CORONARY ANGIOGRAM N/A 08/27/2013   Procedure: LEFT HEART CATHETERIZATION WITH CORONARY ANGIOGRAM;  Surgeon: Peter M Martinique, MD;  Location: [facility] CATH LAB;  Service: Cardiovascular;  Laterality: N/A;  . LUMBAR DISC SURGERY  1980'S  . RADICAL RETROPUBIC PROSTATECTOMY W/ BILATERAL PELVIC NODE DISSECTION  11-18-2005  . TRANSURETHRAL INCISION OF PROSTATE N/A 11/01/2020   Procedure: TRANSURETHRAL INCISION OF THE BLADDER NECK;  Surgeon: Ardis Hughs, MD;  Location: [facility];  Service: Urology;  Laterality:  N/A;  . URETHROTOMY N/A 05/30/2014   Procedure: CYSTOSCOPY  BALLOON DILATION AND DIRECT VISION INTERNAL URETHROTOMY;  Surgeon: Sharyn Creamer, MD;  Location: [facility];  Service: Urology;  Laterality: N/A;    Family History  Problem Relation Age of Onset  . Cancer Father        prostate died 55 something  . Cancer Brother        prostate cancer  . Arthritis Other   . Hyperlipidemia Other   . Hypertension Other     Social History   Socioeconomic History  . Marital status: Single    Spouse name: Not on file  . Number of children: 2  . Years of education: 63  . Highest education level: Not on file  Occupational History  . Occupation: Retired    Fish farm manager: RETIRED  Tobacco Use  . Smoking status: Former Smoker    Packs/day: 2.00    Years: 10.00    Pack years: 20.00    Types: Cigarettes    Quit date: 12/03/1994    Years since quitting: 26.0  . Smokeless tobacco: Never Used  Vaping Use  . Vaping Use: Never used  Substance and Sexual Activity  . Alcohol use: Yes    Comment: occasionally  . Drug use: No  . Sexual activity: Not on file  Other Topics Concern  . Not on file  Social History Narrative   Lives alone   Caffeine use: Coffee- 3 cups daily   Soda sometimes.    Social Determinants of Health   Financial Resource Strain: Not on file  Food Insecurity: Not on file  Transportation Needs: Not on file  Physical Activity: Not on file  Stress: Not on file  Social Connections: Not on file  Intimate Partner Violence: Not on file    Outpatient Medications Prior to Visit  Medication Sig Dispense Refill  . aspirin 81 MG EC tablet Take 1 tablet (81 mg total) by mouth daily.    [name] atorvastatin (LIPITOR) 40 MG tablet Take one tablet by mouth once daily. 90 tablet 3  . carvedilol (COREG) 12.5 MG tablet TAKE 1 TABLET BY MOUTH TWICE A DAY 180 tablet 2  . famotidine (PEPCID) 20 MG tablet Take 20 mg by mouth as needed for heartburn or indigestion.    .  furosemide (LASIX) 20 MG tablet Take 1 tablet (20 mg total) by mouth 3 (three) times a week. On Monday, Wednesday, Friday 45 tablet 3  . ibuprofen (ADVIL) 200 MG tablet Take 200 mg by mouth every 6 (six) hours as needed.    . latanoprost (XALATAN) 0.005 % ophthalmic solution Place 1 drop into both eyes at bedtime.     [name] losartan (COZAAR) 25 MG tablet TAKE 1 TABLET BY MOUTH EVERY DAY 90 tablet 3  . Multiple Vitamin (MULTIVITAMIN WITH MINERALS) TABS tablet Take 1 tablet by mouth daily.    [name]  nitroGLYCERIN (NITROSTAT) 0.4 MG SL tablet PLACE 1 TABLET UNDER THE TONUE EVERY 5 MINUTES AS NEEDED FOR CHEST PAIN. 75 tablet 3  . polyethylene glycol (MIRALAX / GLYCOLAX) packet Take 17 g by mouth daily as needed (for constipation).     . sertraline (ZOLOFT) 50 MG tablet TAKE 1 TABLET BY MOUTH EVERY DAY (Patient taking differently: daily.) 90 tablet 0  . traMADol (ULTRAM) 50 MG tablet Take 1-2 tablets (50-100 mg total) by mouth every 6 (six) hours as needed for moderate pain. 15 tablet 0  . vitamin B-12 (CYANOCOBALAMIN) 100 MCG tablet Take 100 mcg by mouth daily.    . cephALEXin (KEFLEX) 500 MG capsule Take 1 capsule (500 mg total) by mouth 3 (three) times daily. 21 capsule 0  . Turmeric (QC TUMERIC COMPLEX PO) Take 1 tablet by mouth daily.     No facility-administered medications prior to visit.    Allergies  Allergen Reactions  . Bee Venom Other (See Comments)    Reaction swelling  . Amoxicillin Rash  . Hydrocodone Other (See Comments)    REACTION: disorientation    ROS Review of Systems  Constitutional: Negative for chills and fever.  Respiratory: Negative for shortness of breath.   Cardiovascular: Negative for chest pain.  Gastrointestinal: Positive for constipation. Negative for abdominal distention, abdominal pain, anal bleeding, blood in stool, nausea and vomiting.  Genitourinary: Negative for dysuria, hematuria and scrotal swelling.      Objective:    Physical Exam Vitals reviewed.   Constitutional:      Appearance: Normal appearance.  Cardiovascular:     Rate and Rhythm: Normal rate and regular rhythm.  Pulmonary:     Effort: Pulmonary effort is normal.     Breath sounds: Normal breath sounds.  Genitourinary:    Comments: Very small incidental skin tag right side of anus. No other external hemorrhoids noted. No anal fissure. Digital exam reveals no rectal mass and no reproducible pain on exam.  No scrotal swelling and no testicle pain Skin:    Findings: No rash.  Neurological:     Mental Status: He is alert.     BP 122/70   Pulse (!) 54   Ht 5\' 8"  (1.727 m)   Wt 137 lb (62.1 kg)   SpO2 99%   BMI 20.83 kg/m  Wt Readings from Last 3 Encounters:  12/20/20 137 lb (62.1 kg)  11/22/20 136 lb 6.4 oz (61.9 kg)  11/01/20 131 lb 3.2 oz (59.5 kg)     There are no preventive care reminders to display for this patient.  There are no preventive care reminders to display for this patient.  Lab Results  Component Value Date   TSH 0.93 10/28/2012   Lab Results  Component Value Date   WBC 6.2 05/12/2017   HGB 12.9 (L) 11/01/2020   HCT 38.0 (L) 11/01/2020   MCV 104.0 (H) 05/12/2017   PLT 160.0 05/12/2017   Lab Results  Component Value Date   NA 144 11/01/2020   K 4.8 11/01/2020   CO2 24 08/22/2020   GLUCOSE 94 11/01/2020   BUN 28 (H) 11/01/2020   CREATININE 1.40 (H) 11/01/2020   BILITOT 1.0 02/01/2020   ALKPHOS 67 02/01/2020   AST 21 02/01/2020   ALT 21 02/01/2020   PROT 6.9 02/01/2020   ALBUMIN 4.1 02/01/2020   CALCIUM 9.4 08/22/2020   GFR 47.34 (L) 02/01/2020   Lab Results  Component Value Date   CHOL 108 02/01/2020   Lab Results  Component  Value Date   HDL 42.80 02/01/2020   Lab Results  Component Value Date   LDLCALC 42 02/01/2020   Lab Results  Component Value Date   TRIG 116.0 02/01/2020   Lab Results  Component Value Date   CHOLHDL 3 02/01/2020   No results found for: HGBA1C    Assessment & Plan:   Problem List  Items Addressed This Visit   None   Visit Diagnoses    Pain of perianal area    -  Primary   Constipation, unspecified constipation type        Patient relates pain perineum area following transurethral incision bladder neck. Unremarkable exam. His concern today mostly was ruling out things like rectal cancer. He is not seeing any blood in his stools whatsoever and rectal exam is unremarkable. He does relate some intermittent constipation. We discussed measures to reduce that. We recommend close follow-up with urologist. He does have some leftover tramadol at home and we suggested trying tramadol up to every 6 hours to see if this makes any difference in his pain. If pain persist the neurologist has no further thoughts consider CT pelvis to further assess  No orders of the defined types were placed in this encounter.   Follow-up: No follow-ups on file.    [name], MD

## 2020-12-25 DIAGNOSIS — N320 Bladder-neck obstruction: Secondary | ICD-10-CM | POA: Diagnosis not present

## 2021-01-08 DIAGNOSIS — R100 Acute abdomen: Secondary | ICD-10-CM | POA: Diagnosis not present

## 2021-01-08 DIAGNOSIS — R102 Pelvic and perineal pain: Secondary | ICD-10-CM | POA: Diagnosis not present

## 2021-01-08 DIAGNOSIS — N320 Bladder-neck obstruction: Secondary | ICD-10-CM | POA: Diagnosis not present

## 2021-01-16 DIAGNOSIS — Z8551 Personal history of malignant neoplasm of bladder: Secondary | ICD-10-CM | POA: Diagnosis not present

## 2021-01-16 DIAGNOSIS — R102 Pelvic and perineal pain: Secondary | ICD-10-CM | POA: Diagnosis not present

## 2021-01-16 DIAGNOSIS — Z8546 Personal history of malignant neoplasm of prostate: Secondary | ICD-10-CM | POA: Diagnosis not present

## 2021-01-24 ENCOUNTER — Other Ambulatory Visit: Payer: Medicare HMO | Admitting: *Deleted

## 2021-01-24 ENCOUNTER — Other Ambulatory Visit: Payer: Self-pay

## 2021-01-24 DIAGNOSIS — I2510 Atherosclerotic heart disease of native coronary artery without angina pectoris: Secondary | ICD-10-CM

## 2021-01-24 DIAGNOSIS — I714 Abdominal aortic aneurysm, without rupture, unspecified: Secondary | ICD-10-CM

## 2021-01-24 DIAGNOSIS — I5022 Chronic systolic (congestive) heart failure: Secondary | ICD-10-CM

## 2021-01-24 DIAGNOSIS — I10 Essential (primary) hypertension: Secondary | ICD-10-CM | POA: Diagnosis not present

## 2021-01-24 LAB — HEPATIC FUNCTION PANEL
ALT: 21 IU/L (ref 0–44)
AST: 20 IU/L (ref 0–40)
Albumin: 4.2 g/dL (ref 3.6–4.6)
Alkaline Phosphatase: 81 IU/L (ref 44–121)
Bilirubin Total: 0.4 mg/dL (ref 0.0–1.2)
Bilirubin, Direct: 0.14 mg/dL (ref 0.00–0.40)
Total Protein: 6.6 g/dL (ref 6.0–8.5)

## 2021-01-24 LAB — BASIC METABOLIC PANEL
BUN/Creatinine Ratio: 14 (ref 10–24)
BUN: 19 mg/dL (ref 8–27)
CO2: 24 mmol/L (ref 20–29)
Calcium: 9.6 mg/dL (ref 8.6–10.2)
Chloride: 103 mmol/L (ref 96–106)
Creatinine, Ser: 1.36 mg/dL — ABNORMAL HIGH (ref 0.76–1.27)
GFR calc Af Amer: 56 mL/min/{1.73_m2} — ABNORMAL LOW (ref >59)
GFR calc non Af Amer: 48 mL/min/{1.73_m2} — ABNORMAL LOW (ref >59)
Glucose: 90 mg/dL (ref 65–99)
Potassium: 5 mmol/L (ref 3.5–5.2)
Sodium: 140 mmol/L (ref 134–144)

## 2021-01-24 LAB — LIPID PANEL
Chol/HDL Ratio: 2.5 ratio (ref 0.0–5.0)
Cholesterol, Total: 109 mg/dL (ref 100–199)
HDL: 44 mg/dL (ref >39)
LDL Chol Calc (NIH): 48 mg/dL (ref 0–99)
Triglycerides: 84 mg/dL (ref 0–149)
VLDL Cholesterol Cal: 17 mg/dL (ref 5–40)

## 2021-01-28 ENCOUNTER — Encounter: Payer: Self-pay | Admitting: Cardiovascular Disease

## 2021-01-28 NOTE — Progress Notes (Signed)
[name] Date of Birth  06/05/1936         [address]    [place]    [place], [place]  [address]          Problem list: 1. Coronary artery disease- status post remote PTCA to the left complex artery in 1991, CABG 2014 2. Hypertension 3. Hyperlipidemia 4. Prostate Cancer.     [name] is  a 85 y.o. -year-old gentleman with a history of PTCA approximately 22 years ago.  He's not had any episodes of chest pain or shortness of breath. He stays fairly active although he doesn't exercise at the gym.  He has some problems with some arthritis-type pain in his right hip.  The Lipitor causes a little bit of muscular skeletal pain.  May 10, 2013:  He stopped hib Benicar 2 months ago. His BP has been running low at home.  He has been on a new medicine for his prostate and was concerned that his BP would run too low.   He has been taking a varied dose of benicar - 1 tab, 1/2 tab, 1/4 tablet whenever he thinks he needs it.   Sept. 5, 2014:  [name] presents today for worsening shortness of breath and chest pain.  Last Saturday, he mowed the lawn and spread 200 lbs of lime.  He developed severe CP and started to take some NTG.  The pain finally eased up. Several days later, he had recurrent CP while cleaning out the gutters.    He thinks he was just overheated.   He has been recording his BP and has been getting some low readings.     He has been having some hip pain and does not know if he will be able to walk on the treadmill.   Sept. 24, 2014: [name] was seen recently with some worsening chest pain. He had a stress Myoview study which was interpreted as low risk he has a fixed inferoapical defect.  He exertion, he is still having angina.  Lasts 5 minutes. In the center of his chest. No radiatioin.  Occurs only with exertion ( stating mower or Rototiller- never walking around the house.)   Class II - III angina.    Yesterday, he had some mild baseline CP which worsened while  walking around the grocery store.   Dec. 16, 2014:  [name] has had CABG since I last saw him in the office.  Still has chest tenderness - breathing is good, no CP  Chest wall is still tender.  May 11, 2014:  [name] is doing ok.   Jan. 4, 2015: Doing well  July 21, 2015:  Doing well from a cardiac standpoint  Still lonely ,  Mentally not where he needs to be since the death of his wife .   Visits with his 2 daughters frequently    Feb. 16, 2017:  Still grieving over the death of his wife , 16 months ago .   Were married 55 years.  Got some counseling at [facility] but was not as effective as he would like    Aug. 16, 2017: No CP or dyspnea  Has easy bruising on his arms due to the ASA walks some on the treadmill  No CP   Feb. 12, 2018  Has had some visual issues. Had a head MRI yesterday .   No CP or dyspnea   Oct. 4, 2019:  Has been having more leg swelling recently .  Still eats some salts  Fixes his own meals .   Still eats some canned veggies  Is not more short of breath compared to his usual . No CP  Has been having swelling of his left lower leg   Aug. 7, 2020:  Doing well.   .  Little of exercise.   Treadmill 30 min a day  No CP or dyspnea with walking   Feb. 9, 2021:  [name] is seen today . Has a hx of CAD and chronic combined CHF No CP .  Breathing is ok   Avoids salt .     07/31/2020:  No cardiac complains Has leg cramps at night  Has occasional low BP readings .  Occasionally associated with some dizziness.  No CP .    Feb. 28, 2022 [name] is seen today for follow of his CAD, AAA Still eating some sal t   Current Outpatient Medications on File Prior to Visit  Medication Sig Dispense Refill  . aspirin 81 MG EC tablet Take 1 tablet (81 mg total) by mouth daily.    [name] atorvastatin (LIPITOR) 40 MG tablet Take one tablet by mouth once daily. 90 tablet 3  . carvedilol (COREG) 12.5 MG tablet TAKE 1 TABLET BY MOUTH TWICE A DAY 180 tablet 2  .  famotidine (PEPCID) 20 MG tablet Take 20 mg by mouth as needed for heartburn or indigestion.    . furosemide (LASIX) 20 MG tablet Take 1 tablet (20 mg total) by mouth 3 (three) times a week. On Monday, Wednesday, Friday 45 tablet 3  . ibuprofen (ADVIL) 200 MG tablet Take 200 mg by mouth every 6 (six) hours as needed.    . latanoprost (XALATAN) 0.005 % ophthalmic solution Place 1 drop into both eyes at bedtime.     [name] losartan (COZAAR) 25 MG tablet TAKE 1 TABLET BY MOUTH EVERY DAY 90 tablet 3  . Multiple Vitamin (MULTIVITAMIN WITH MINERALS) TABS tablet Take 1 tablet by mouth daily.    . nitroGLYCERIN (NITROSTAT) 0.4 MG SL tablet PLACE 1 TABLET UNDER THE TONUE EVERY 5 MINUTES AS NEEDED FOR CHEST PAIN. 75 tablet 3  . polyethylene glycol (MIRALAX / GLYCOLAX) packet Take 17 g by mouth daily as needed (for constipation).     . sertraline (ZOLOFT) 50 MG tablet TAKE 1 TABLET BY MOUTH EVERY DAY (Patient taking differently: daily.) 90 tablet 0  . traMADol (ULTRAM) 50 MG tablet Take 1-2 tablets (50-100 mg total) by mouth every 6 (six) hours as needed for moderate pain. 15 tablet 0  . vitamin B-12 (CYANOCOBALAMIN) 100 MCG tablet Take 100 mcg by mouth daily.     No current facility-administered medications on file prior to visit.    Allergies  Allergen Reactions  . Bee Venom Other (See Comments)    Reaction swelling  . Amoxicillin Rash  . Hydrocodone Other (See Comments)    REACTION: disorientation    Past Medical History:  Diagnosis Date  . AAA (abdominal aortic aneurysm) ([facility])   . AAA (abdominal aortic aneurysm) ([facility])    4-4 cm intrarenal aaa per US aorta 03-28-2019 epic  . Benign positional vertigo   . Bladder cancer ([facility])   . Bladder neck contracture   . Coronary artery disease CARDIOLOGIST--  DR Acie Fredrickson  . ED (erectile dysfunction)   . GERD (gastroesophageal reflux disease)   . H/O hiatal hernia   . History of anal fissures   . History of colon polyps   . History of radiation therapy  08/24/12-10/22/12   prostate [id number]/33 sessions--  for biochemical recurrence  . Hyperlipidemia   . Hypertension   . OA (osteoarthritis)   . Peripheral vascular disease ([facility])   . Recurrent prostate adenocarcinoma ([facility]) first dx 09/2005--  radial prostatectomy-- gleason 4+3=7, pT2c, negative margin   04/2012--  BIOCHEMICAL  RECURRENCE to 0.33 /  SALVAGE IMRT SEPT to  NOV 2013  . S/P CABG x 2    09/  2014  . S/P dilatation of esophageal stricture    1992  . Sciatica    both hips at times mostly left hip  . Sigmoid diverticulosis   . SUI (stress urinary incontinence), male   . Urethral stricture   . Wears glasses     Past Surgical History:  Procedure Laterality Date  . CARDIAC CATHETERIZATION  08-27-2013  DR Martinique   CRITICAL OSTIAL LM STENOSIS/  diffuse disease LAD 40-50%  &   pLCx 30-40%/  . CARDIOVASCULAR STRESS TEST  08-18-2013  DR NASHER   SMALL/ MEDIUM AREA MILD SCAR INFEROSEPTAL WALL/ NO ISCHEMIA/  LOW RISK SCAN/  EF 62%/  LV WALL MOTION WITH SLIGHT DECREASED OF THE SEPTUM  . CORONARY ANGIOPLASTY  1991   PTCA  of LEFT CFX  . CORONARY ARTERY BYPASS GRAFT N/A 08/27/2013   Procedure: CORONARY ARTERY BYPASS GRAFTING (CABG) times two using left internal mammary and right saphenous vein using endoscope.;  Surgeon: Melrose Nakayama, MD;  Location: [facility];  Service: Open Heart Surgery;  Laterality: N/A;  . CYSTOSCOPY WITH URETHRAL DILATATION N/A 01/26/2014   Procedure: CYSTOSCOPY WITH URETHRAL DILATATION, BLADDER BIOPSY, FOLEY CATHETER;  Surgeon: Molli Hazard, MD;  Location: [facility];  Service: Urology;  Laterality: N/A;  . INGUINAL HERNIA REPAIR Bilateral 1994  . KNEE ARTHROSCOPY Right yrs ago  . LEFT HEART CATHETERIZATION WITH CORONARY ANGIOGRAM N/A 08/27/2013   Procedure: LEFT HEART CATHETERIZATION WITH CORONARY ANGIOGRAM;  Surgeon: Peter M Martinique, MD;  Location: [facility] CATH LAB;  Service: Cardiovascular;  Laterality: N/A;  . LUMBAR DISC SURGERY  1980'S  . RADICAL RETROPUBIC  PROSTATECTOMY W/ BILATERAL PELVIC NODE DISSECTION  11-18-2005  . TRANSURETHRAL INCISION OF PROSTATE N/A 11/01/2020   Procedure: TRANSURETHRAL INCISION OF THE BLADDER NECK;  Surgeon: Ardis Hughs, MD;  Location[facility];  Service: Urology;  Laterality: N/A;  . URETHROTOMY N/A 05/30/2014   Procedure: CYSTOSCOPY  BALLOON DILATION AND DIRECT VISION INTERNAL URETHROTOMY;  Surgeon: Sharyn Creamer, MD;  Location: [facility];  Service: Urology;  Laterality: N/A;    Social History   Tobacco Use  Smoking Status Former Smoker  . Packs/day: 2.00  . Years: 10.00  . Pack years: 20.00  . Types: Cigarettes  . Quit date: 12/03/1994  . Years since quitting: 26.1  Smokeless Tobacco Never Used    Social History   Substance and Sexual Activity  Alcohol Use Yes   Comment: occasionally    Family History  Problem Relation Age of Onset  . Cancer Father        prostate died 77 something  . Cancer Brother        prostate cancer  . Arthritis Other   . Hyperlipidemia Other   . Hypertension Other     Reviw of Systems:   Physical Exam: Blood pressure (!) 142/72, pulse (!) 53, height 5' 8.5" (1.74 m), weight 137 lb 9.6 oz (62.4 kg), SpO2 100 %.  GEN:  Well nourished, well developed in no acute distress HEENT: Normal NECK: No JVD;  No carotid bruits LYMPHATICS: No lymphadenopathy CARDIAC: RRR , no murmurs, rubs, gallops RESPIRATORY:  Clear to auscultation without rales, wheezing or rhonchi  ABDOMEN: Soft, non-tender, non-distended MUSCULOSKELETAL:  No edema; No deformity  SKIN: Warm and dry NEUROLOGIC:  Alert and oriented x 3   ECG:      Assessment / Plan:   1. Coronary artery disease-   no angina ,  Cont current meds.     2. Hypertension :   BP is mildly elevated.   Will increase Losartan to 50 mg a day  3. Hyperlipidemia-   Labs from Feb. 23, 2022 look good    4. Prostate Cancer.     5. Abdominal aortic aneurism  -  Followed by [facility]   6.   Chronic combined congestive heart failure:     Advised him to avoid salt .  Increase Losartan to 50 mg a day     Mertie Moores, MD  01/29/2021 1:39 PM    [facility] [place],  [place], [place]  [address] Pager [phone] Phone: [phone]; Fax: [phone]

## 2021-01-29 ENCOUNTER — Ambulatory Visit: Payer: Medicare HMO | Admitting: Cardiovascular Disease

## 2021-01-29 ENCOUNTER — Other Ambulatory Visit: Payer: Self-pay

## 2021-01-29 ENCOUNTER — Encounter: Payer: Self-pay | Admitting: Cardiovascular Disease

## 2021-01-29 VITALS — BP 142/72 | HR 53 | Ht 68.5 in | Wt 137.6 lb

## 2021-01-29 DIAGNOSIS — I714 Abdominal aortic aneurysm, without rupture, unspecified: Secondary | ICD-10-CM

## 2021-01-29 DIAGNOSIS — I10 Essential (primary) hypertension: Secondary | ICD-10-CM

## 2021-01-29 DIAGNOSIS — I2510 Atherosclerotic heart disease of native coronary artery without angina pectoris: Secondary | ICD-10-CM

## 2021-01-29 MED ORDER — LOSARTAN POTASSIUM 50 MG PO TABS: 50.0000 mg | ORAL_TABLET | Freq: Every day | ORAL | 3 refills | Status: DC

## 2021-01-29 NOTE — Patient Instructions (Addendum)
Medication Instructions:  Your physician has recommended you make the following change in your medication:   INCREASE Losartan to 50mg  daily  *If you need a refill on your cardiac medications before your next appointment, please call your pharmacy*   Lab Work: Return in 2-3 weeks to repeat BMET If you have labs (blood work) drawn today and your tests are completely normal, you will receive your results only by: [name] MyChart Message (if you have MyChart) OR . A paper copy in the mail If you have any lab test that is abnormal or we need to change your treatment, we will call you to review the results.   Testing/Procedures: none   Follow-Up: At [facility], you and your health needs are our priority.  As part of our continuing mission to provide you with exceptional heart care, we have created designated Provider Care Teams.  These Care Teams include your primary Cardiologist (physician) and Advanced Practice Providers (APPs -  Physician Assistants and Nurse Practitioners) who all work together to provide you with the care you need, when you need it.  Your next appointment:   6 month(s)  The format for your next appointment:   In Person  Provider:   You may see one of the following Advanced Practice Providers on your designated Care Team:    Richardson Dopp, PA-C  [place]in [place], [place]

## 2021-01-29 NOTE — Addendum Note (Signed)
Addended by: Para March on: 01/29/2021 01:59 PM   Modules accepted: Orders

## 2021-01-31 ENCOUNTER — Telehealth: Payer: Self-pay | Admitting: Family Medicine

## 2021-01-31 NOTE — Telephone Encounter (Signed)
Left message for patient to call back and schedule Medicare Annual Wellness Visit (AWV) either virtually or in office. No detailed message left    Last AWV 09/28/2019  please schedule at anytime with [facility] Nurse Health Advisor 1 or 2   This should be a 45 minute visit.

## 2021-02-05 DIAGNOSIS — N320 Bladder-neck obstruction: Secondary | ICD-10-CM | POA: Diagnosis not present

## 2021-02-05 DIAGNOSIS — Z8546 Personal history of malignant neoplasm of prostate: Secondary | ICD-10-CM | POA: Diagnosis not present

## 2021-02-05 DIAGNOSIS — R311 Benign essential microscopic hematuria: Secondary | ICD-10-CM | POA: Diagnosis not present

## 2021-02-05 DIAGNOSIS — R102 Pelvic and perineal pain: Secondary | ICD-10-CM | POA: Diagnosis not present

## 2021-02-05 DIAGNOSIS — N393 Stress incontinence (female) (male): Secondary | ICD-10-CM | POA: Diagnosis not present

## 2021-02-12 ENCOUNTER — Other Ambulatory Visit: Payer: Medicare HMO

## 2021-02-12 ENCOUNTER — Other Ambulatory Visit: Payer: Self-pay

## 2021-02-12 DIAGNOSIS — I10 Essential (primary) hypertension: Secondary | ICD-10-CM

## 2021-02-12 DIAGNOSIS — I714 Abdominal aortic aneurysm, without rupture, unspecified: Secondary | ICD-10-CM

## 2021-02-12 DIAGNOSIS — I2510 Atherosclerotic heart disease of native coronary artery without angina pectoris: Secondary | ICD-10-CM | POA: Diagnosis not present

## 2021-02-12 LAB — BASIC METABOLIC PANEL
BUN/Creatinine Ratio: 16 (ref 10–24)
BUN: 24 mg/dL (ref 8–27)
CO2: 24 mmol/L (ref 20–29)
Calcium: 9.1 mg/dL (ref 8.6–10.2)
Chloride: 103 mmol/L (ref 96–106)
Creatinine, Ser: 1.5 mg/dL — ABNORMAL HIGH (ref 0.76–1.27)
Glucose: 118 mg/dL — ABNORMAL HIGH (ref 65–99)
Potassium: 4.1 mmol/L (ref 3.5–5.2)
Sodium: 140 mmol/L (ref 134–144)
eGFR: 46 mL/min/{1.73_m2} — ABNORMAL LOW (ref >59)

## 2021-02-23 ENCOUNTER — Encounter: Payer: Medicare HMO | Admitting: Family Medicine

## 2021-02-26 DIAGNOSIS — D303 Benign neoplasm of bladder: Secondary | ICD-10-CM | POA: Diagnosis not present

## 2021-02-26 DIAGNOSIS — Z8546 Personal history of malignant neoplasm of prostate: Secondary | ICD-10-CM | POA: Diagnosis not present

## 2021-02-26 DIAGNOSIS — N3021 Other chronic cystitis with hematuria: Secondary | ICD-10-CM | POA: Diagnosis not present

## 2021-02-26 DIAGNOSIS — N320 Bladder-neck obstruction: Secondary | ICD-10-CM | POA: Diagnosis not present

## 2021-03-01 IMAGING — US ULTRASOUND AORTA
1 series · 14 of 15 positions shown · non-contrast
Comparison: 08/26/2017

CLINICAL DATA: Follow-up abdominal aortic aneurysm

EXAM:
ULTRASOUND OF ABDOMINAL AORTA
TECHNIQUE: Ultrasound examination of the abdominal aorta and proximal common
iliac arteries was performed to evaluate for aneurysm. Additional
color and Doppler images of the distal aorta were obtained to
document patency.

[Series 1: ultrasound aorta · 0.28mm/px · 14 of 15 slices shown]
[im 1/15]
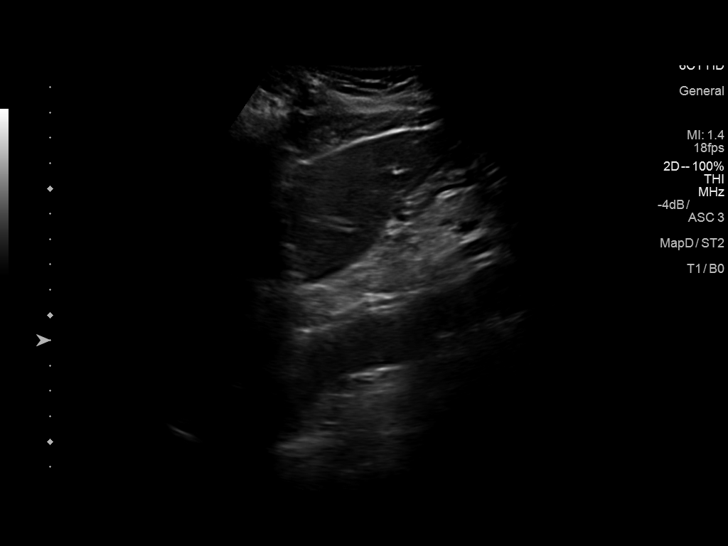
[im 2/15]
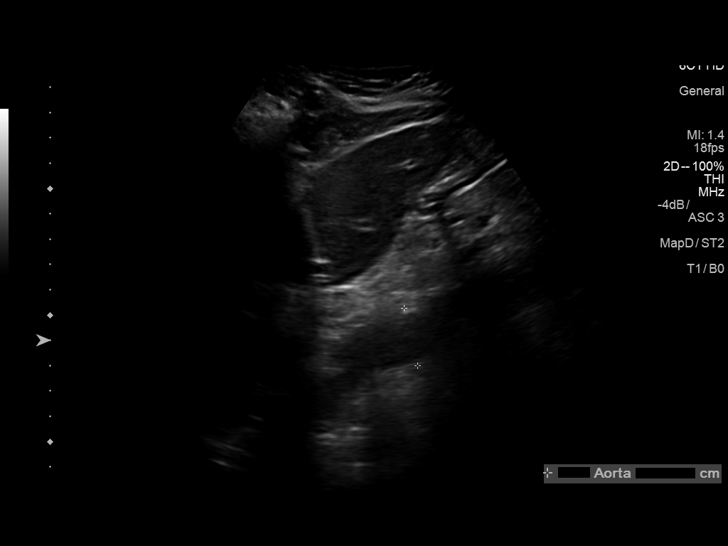
[im 3/15]
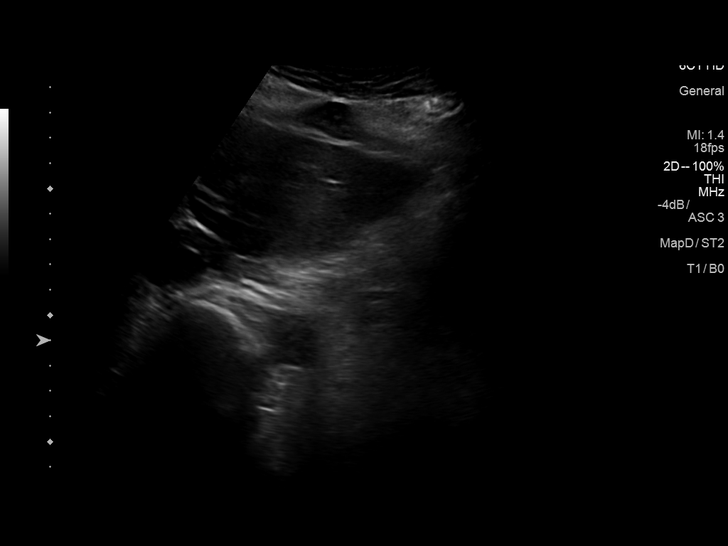
[im 4/15]
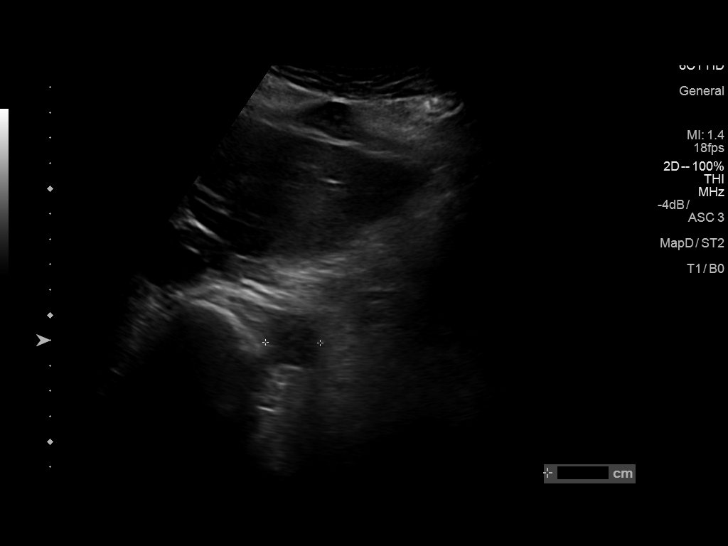
[im 5/15]
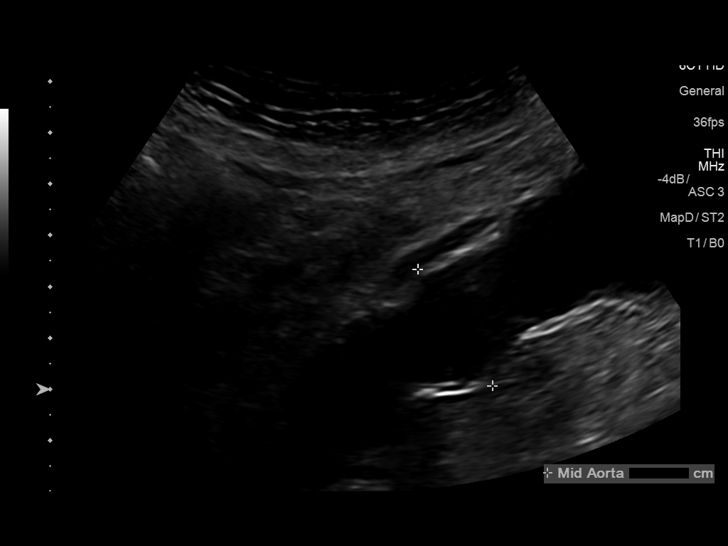
[im 6/15]
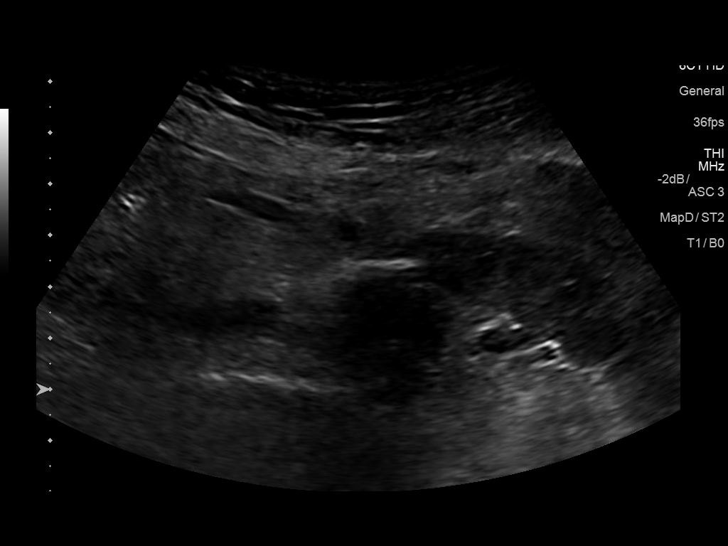
[im 7/15]
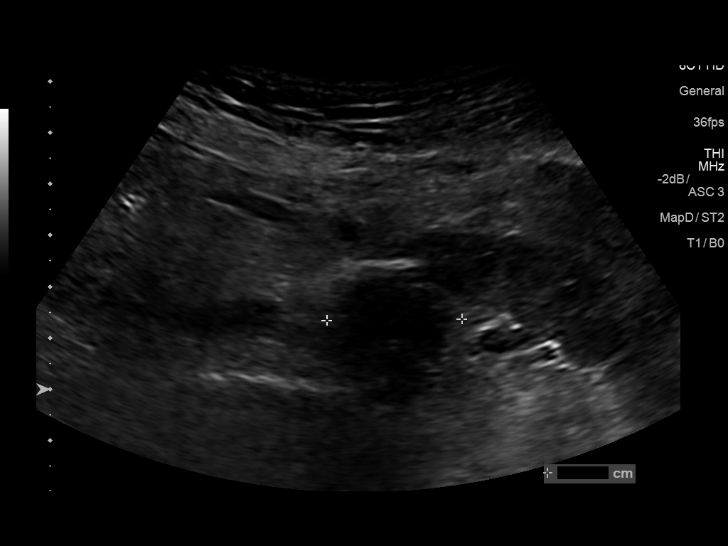
[im 9/15]
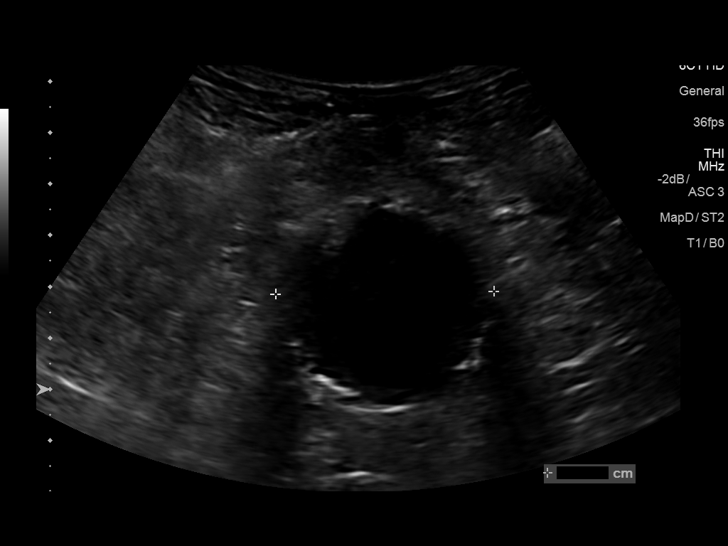
[im 10/15]
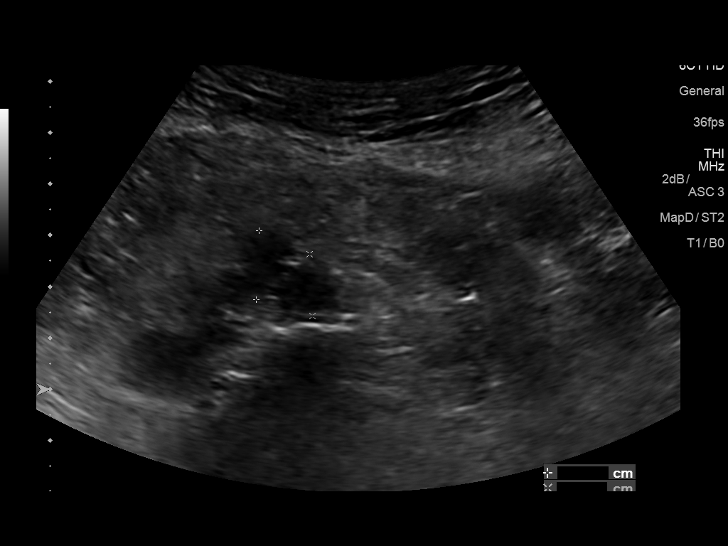
[im 11/15]
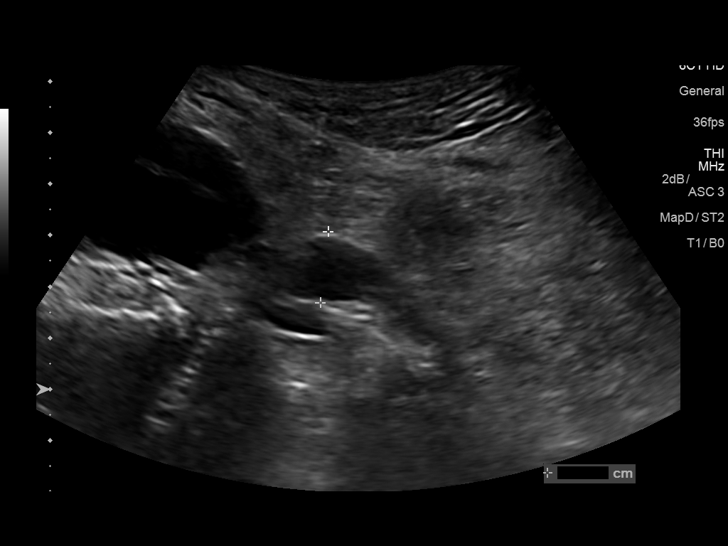
[im 12/15]
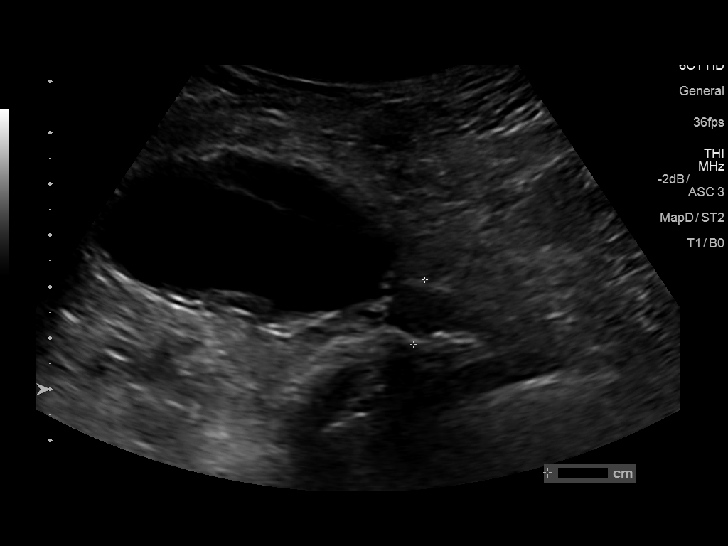
[im 13/15]
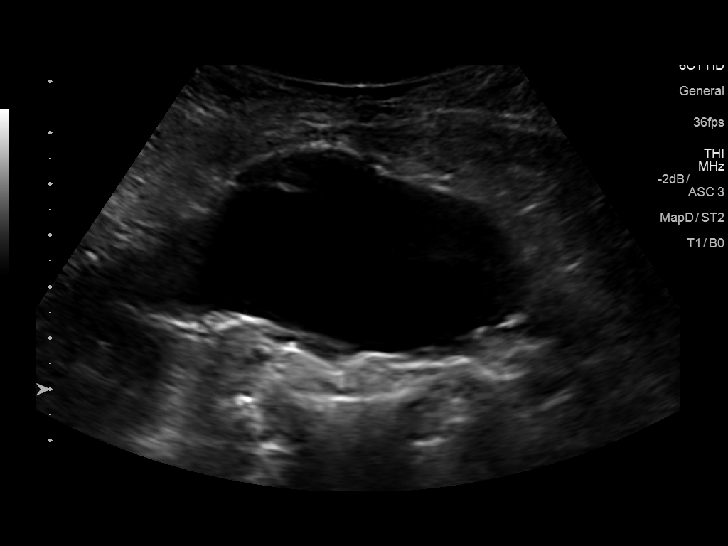
[im 14/15]
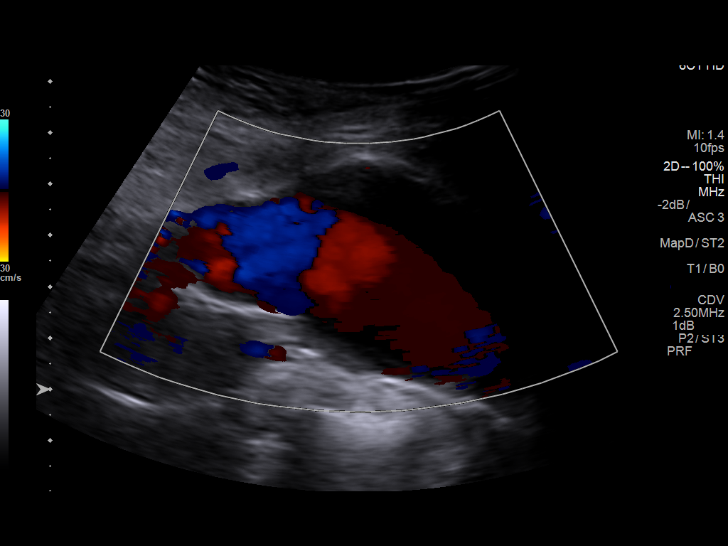
[im 15/15]
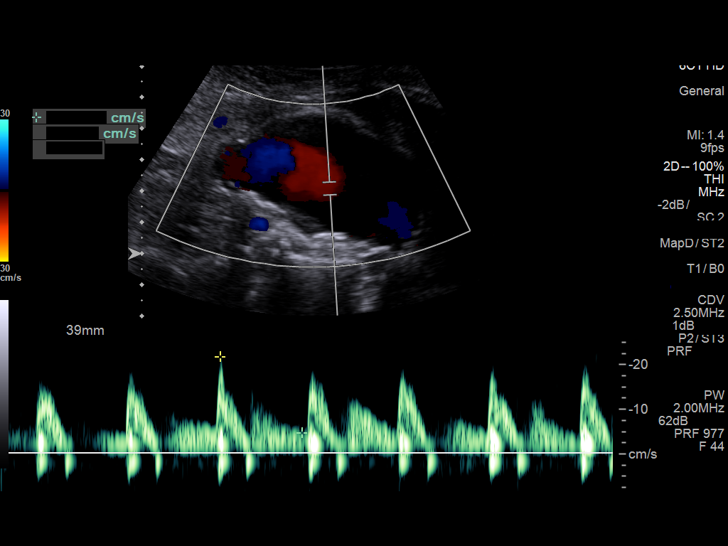

[14 of 15 positions shown; findings below may reference images not displayed]

FINDINGS: Abdominal aortic measurements as follows:

Proximal:  2.3 cm

Mid:  2.7 cm

Distal:  4.4 cm
Patent: Yes, peak systolic velocity is 21.8 cm/s

Right common iliac artery: 1.4 cm

Left common iliac artery: 1.3 cm
IMPRESSION: Stable abdominal aortic aneurysm with a maximal diameter of 4.4 cm
compared to the prior study. Recommend followup by US in 1 year.
This recommendation follows ACR consensus guidelines: White Paper of
the ACR Incidental Findings Committee II on Vascular Findings. [HOSPITAL] 9690; [DATE].

## 2021-03-06 ENCOUNTER — Other Ambulatory Visit: Payer: Self-pay

## 2021-03-06 ENCOUNTER — Encounter: Payer: Self-pay | Admitting: Family Medicine

## 2021-03-06 ENCOUNTER — Ambulatory Visit (INDEPENDENT_AMBULATORY_CARE_PROVIDER_SITE_OTHER): Payer: Medicare HMO | Admitting: Family Medicine

## 2021-03-06 VITALS — BP 120/58 | HR 68 | Temp 97.7°F | Ht 68.5 in | Wt 140.6 lb

## 2021-03-06 DIAGNOSIS — R1032 Left lower quadrant pain: Secondary | ICD-10-CM | POA: Diagnosis not present

## 2021-03-06 DIAGNOSIS — Z0000 Encounter for general adult medical examination without abnormal findings: Secondary | ICD-10-CM

## 2021-03-06 LAB — CBC WITH DIFFERENTIAL/PLATELET
Basophils Absolute: 0 10*3/uL (ref 0.0–0.1)
Basophils Relative: 0.9 % (ref 0.0–3.0)
Eosinophils Absolute: 0.4 10*3/uL (ref 0.0–0.7)
Eosinophils Relative: 7.7 % — ABNORMAL HIGH (ref 0.0–5.0)
HCT: 34.6 % — ABNORMAL LOW (ref 39.0–52.0)
Hemoglobin: 11.8 g/dL — ABNORMAL LOW (ref 13.0–17.0)
Lymphocytes Relative: 22.2 % (ref 12.0–46.0)
Lymphs Abs: 1.1 10*3/uL (ref 0.7–4.0)
MCHC: 34.1 g/dL (ref 30.0–36.0)
MCV: 106.1 fl — ABNORMAL HIGH (ref 78.0–100.0)
Monocytes Absolute: 0.6 10*3/uL (ref 0.1–1.0)
Monocytes Relative: 12.5 % — ABNORMAL HIGH (ref 3.0–12.0)
Neutro Abs: 2.7 10*3/uL (ref 1.4–7.7)
Neutrophils Relative %: 56.7 % (ref 43.0–77.0)
Platelets: 123 10*3/uL — ABNORMAL LOW (ref 150.0–400.0)
RBC: 3.26 Mil/uL — ABNORMAL LOW (ref 4.22–5.81)
RDW: 12.6 % (ref 11.5–15.5)
WBC: 4.8 10*3/uL (ref 4.0–10.5)

## 2021-03-06 MED ORDER — METRONIDAZOLE 500 MG PO TABS: 500.0000 mg | ORAL_TABLET | Freq: Three times a day (TID) | ORAL | 0 refills | Status: DC

## 2021-03-06 MED ORDER — CIPROFLOXACIN HCL 500 MG PO TABS: 500.0000 mg | ORAL_TABLET | Freq: Two times a day (BID) | ORAL | 0 refills | Status: DC

## 2021-03-06 NOTE — Progress Notes (Signed)
Established Patient Office Visit  Subjective:  Patient ID: [name], male    DOB: 1936/09/04  Age: 85 y.o. MRN: [id number]  CC:  Chief Complaint  Patient presents with  . Annual Exam    Abdominal pain, x 1 week, mid stomach, BM regular    HPI [name] presents for physical exam and separate problem as below.  Somewhat of a poor historian.  He has history of abdominal aortic aneurysm, CAD, hypertension, GERD, osteoarthritis, history of prostate cancer, chronic kidney disease.  He is followed by cardiology and had recent lipids and these were reviewed.  These were relatively stable.  His main issue right now is 1 week history of abdominal pain left lower quadrant.  He had colonoscopy back in 2014 which showed sigmoid diverticula.  No known history of diverticulitis.  Dull achy pain past week.  No fever.  No vomiting or diarrhea.  No stool changes.  No bloody stools.  Appetite fair.  No radiation of pain.  No exacerbating or alleviating factors.    He had recent bladder procedure per urology and just finished Cipro over week ago.  Health maintenance reviewed  -Immunizations are up-to-date.  Has had previous Covid vaccines as well as Shingrix.  Tetanus up-to-date.  Flu vaccine already given.  Has aged out of colonoscopies.  Family history-father and brother had prostate cancer.  Mom died of what sounds like complications of small bowel obstruction.  Social history-he has been widowed for over 6 years.  Retired after 35 years of work with [employer].  Occasional alcohol use but infrequent.  Quit smoking several years ago.  He has 2 daughters and 5 grandchildren.  Past Medical History:  Diagnosis Date  . AAA (abdominal aortic aneurysm) ([employer])   . AAA (abdominal aortic aneurysm) ([facility])    4-4 cm intrarenal aaa per US aorta 03-28-2019 epic  . Benign positional vertigo   . Bladder cancer ([place])   . Bladder neck contracture   . Coronary artery disease CARDIOLOGIST--  DR Acie Fredrickson   . ED (erectile dysfunction)   . GERD (gastroesophageal reflux disease)   . H/O hiatal hernia   . History of anal fissures   . History of colon polyps   . History of radiation therapy 08/24/12-10/22/12   prostate 6600cGy/33 sessions--  for biochemical recurrence  . Hyperlipidemia   . Hypertension   . OA (osteoarthritis)   . Peripheral vascular disease ([place])   . Recurrent prostate adenocarcinoma ([place]) first dx 09/2005--  radial prostatectomy-- gleason 4+3=7, pT2c, negative margin   04/2012--  BIOCHEMICAL  RECURRENCE to 0.33 /  SALVAGE IMRT SEPT to  NOV 2013  . S/P CABG x 2    09/  2014  . S/P dilatation of esophageal stricture    1992  . Sciatica    both hips at times mostly left hip  . Sigmoid diverticulosis   . SUI (stress urinary incontinence), male   . Urethral stricture   . Wears glasses     Past Surgical History:  Procedure Laterality Date  . CARDIAC CATHETERIZATION  08-27-2013  DR Martinique   CRITICAL OSTIAL LM STENOSIS/  diffuse disease LAD 40-50%  &   pLCx 30-40%/  . CARDIOVASCULAR STRESS TEST  08-18-2013  DR NASHER   SMALL/ MEDIUM AREA MILD SCAR INFEROSEPTAL WALL/ NO ISCHEMIA/  LOW RISK SCAN/  EF 62%/  LV WALL MOTION WITH SLIGHT DECREASED OF THE SEPTUM  . CORONARY ANGIOPLASTY  1991   PTCA  of LEFT CFX  .  CORONARY ARTERY BYPASS GRAFT N/A 08/27/2013   Procedure: CORONARY ARTERY BYPASS GRAFTING (CABG) times two using left internal mammary and right saphenous vein using endoscope.;  Surgeon: Melrose Nakayama, MD;  Location: [place];  Service: Open Heart Surgery;  Laterality: N/A;  . CYSTOSCOPY WITH URETHRAL DILATATION N/A 01/26/2014   Procedure: CYSTOSCOPY WITH URETHRAL DILATATION, BLADDER BIOPSY, FOLEY CATHETER;  Surgeon: Molli Hazard, MD;  Location: [facility];  Service: Urology;  Laterality: N/A;  . INGUINAL HERNIA REPAIR Bilateral 1994  . KNEE ARTHROSCOPY Right yrs ago  . LEFT HEART CATHETERIZATION WITH CORONARY ANGIOGRAM N/A 08/27/2013   Procedure: LEFT HEART  CATHETERIZATION WITH CORONARY ANGIOGRAM;  Surgeon: Peter M Martinique, MD;  Location: [facility] CATH LAB;  Service: Cardiovascular;  Laterality: N/A;  . LUMBAR DISC SURGERY  1980'S  . RADICAL RETROPUBIC PROSTATECTOMY W/ BILATERAL PELVIC NODE DISSECTION  11-18-2005  . TRANSURETHRAL INCISION OF PROSTATE N/A 11/01/2020   Procedure: TRANSURETHRAL INCISION OF THE BLADDER NECK;  Surgeon: Ardis Hughs, MD;  Location: [facility];  Service: Urology;  Laterality: N/A;  . URETHROTOMY N/A 05/30/2014   Procedure: CYSTOSCOPY  BALLOON DILATION AND DIRECT VISION INTERNAL URETHROTOMY;  Surgeon: Sharyn Creamer, MD;  Location: [facility];  Service: Urology;  Laterality: N/A;    Family History  Problem Relation Age of Onset  . Cancer Father        prostate died 75 something  . Cancer Brother        prostate cancer  . Arthritis Other   . Hyperlipidemia Other   . Hypertension Other     Social History   Socioeconomic History  . Marital status: Single    Spouse name: Not on file  . Number of children: 2  . Years of education: 91  . Highest education level: Not on file  Occupational History  . Occupation: Retired    Fish farm manager: RETIRED  Tobacco Use  . Smoking status: Former Smoker    Packs/day: 2.00    Years: 10.00    Pack years: 20.00    Types: Cigarettes    Quit date: 12/03/1994    Years since quitting: 26.2  . Smokeless tobacco: Never Used  Vaping Use  . Vaping Use: Never used  Substance and Sexual Activity  . Alcohol use: Yes    Comment: occasionally  . Drug use: No  . Sexual activity: Not on file  Other Topics Concern  . Not on file  Social History Narrative   Lives alone   Caffeine use: Coffee- 3 cups daily   Soda sometimes.    Social Determinants of Health   Financial Resource Strain: Not on file  Food Insecurity: Not on file  Transportation Needs: Not on file  Physical Activity: Not on file  Stress: Not on file  Social Connections: Not on file   Intimate Partner Violence: Not on file    Outpatient Medications Prior to Visit  Medication Sig Dispense Refill  . aspirin 81 MG EC tablet Take 1 tablet (81 mg total) by mouth daily.    [name] atorvastatin (LIPITOR) 40 MG tablet Take one tablet by mouth once daily. 90 tablet 3  . carvedilol (COREG) 12.5 MG tablet TAKE 1 TABLET BY MOUTH TWICE A DAY 180 tablet 2  . famotidine (PEPCID) 20 MG tablet Take 20 mg by mouth as needed for heartburn or indigestion.    . furosemide (LASIX) 20 MG tablet Take 1 tablet (20 mg total) by mouth 3 (three) times a week. On  Monday, Wednesday, Friday 45 tablet 3  . ibuprofen (ADVIL) 200 MG tablet Take 200 mg by mouth every 6 (six) hours as needed.    . latanoprost (XALATAN) 0.005 % ophthalmic solution Place 1 drop into both eyes at bedtime.     [name] losartan (COZAAR) 50 MG tablet Take 1 tablet (50 mg total) by mouth daily. 90 tablet 3  . Multiple Vitamin (MULTIVITAMIN WITH MINERALS) TABS tablet Take 1 tablet by mouth daily.    . nitroGLYCERIN (NITROSTAT) 0.4 MG SL tablet PLACE 1 TABLET UNDER THE TONUE EVERY 5 MINUTES AS NEEDED FOR CHEST PAIN. 75 tablet 3  . polyethylene glycol (MIRALAX / GLYCOLAX) packet Take 17 g by mouth daily as needed (for constipation).     . sertraline (ZOLOFT) 50 MG tablet TAKE 1 TABLET BY MOUTH EVERY DAY (Patient taking differently: daily.) 90 tablet 0  . traMADol (ULTRAM) 50 MG tablet Take 1-2 tablets (50-100 mg total) by mouth every 6 (six) hours as needed for moderate pain. 15 tablet 0  . vitamin B-12 (CYANOCOBALAMIN) 100 MCG tablet Take 100 mcg by mouth daily.     No facility-administered medications prior to visit.    Allergies  Allergen Reactions  . Bee Venom Other (See Comments)    Reaction swelling  . Amoxicillin Rash  . Hydrocodone Other (See Comments)    REACTION: disorientation    ROS Review of Systems  Constitutional: Negative for activity change, appetite change, fatigue, fever and unexpected weight change.  HENT:  Negative for congestion, ear pain and trouble swallowing.   Eyes: Negative for pain and visual disturbance.  Respiratory: Negative for cough, shortness of breath and wheezing.   Cardiovascular: Negative for chest pain and palpitations.  Gastrointestinal: Positive for abdominal pain. Negative for abdominal distention, blood in stool, constipation, diarrhea, nausea, rectal pain and vomiting.  Genitourinary: Negative for testicular pain.  Musculoskeletal: Negative for arthralgias and joint swelling.  Skin: Negative for rash.  Neurological: Negative for dizziness, syncope and headaches.  Hematological: Negative for adenopathy.  Psychiatric/Behavioral: Negative for confusion and dysphoric mood.      Objective:    Physical Exam Constitutional:      General: He is not in acute distress.    Appearance: He is well-developed.  HENT:     Head: Normocephalic and atraumatic.     Right Ear: External ear normal.     Left Ear: External ear normal.  Eyes:     Conjunctiva/sclera: Conjunctivae normal.     Pupils: Pupils are equal, round, and reactive to light.  Neck:     Thyroid: No thyromegaly.  Cardiovascular:     Rate and Rhythm: Normal rate and regular rhythm.     Heart sounds: Normal heart sounds. No murmur heard.   Pulmonary:     Effort: No respiratory distress.     Breath sounds: No wheezing or rales.  Abdominal:     General: Bowel sounds are normal. There is no distension.     Palpations: Abdomen is soft. There is no mass.     Tenderness: There is abdominal tenderness. There is no guarding or rebound.     Comments: Does have some mild tenderness with deep palpation left lower quadrant.  No guarding or rebound  Musculoskeletal:     Cervical back: Normal range of motion and neck supple.  Lymphadenopathy:     Cervical: No cervical adenopathy.  Skin:    Findings: No rash.  Neurological:     Mental Status: He is alert and oriented to  person, place, and time.     Cranial Nerves: No  cranial nerve deficit.     Deep Tendon Reflexes: Reflexes normal.     BP (!) 120/58 (BP Location: Left Arm, Patient Position: Sitting, Cuff Size: Normal)   Pulse 68   Temp 97.7 F (36.5 C) (Oral)   Ht 5' 8.5" (1.74 m)   Wt 140 lb 9.6 oz (63.8 kg)   SpO2 99%   BMI 21.07 kg/m  Wt Readings from Last 3 Encounters:  03/06/21 140 lb 9.6 oz (63.8 kg)  01/29/21 137 lb 9.6 oz (62.4 kg)  12/20/20 137 lb (62.1 kg)     Health Maintenance Due  Topic Date Due  . COVID-19 Vaccine (4 - Booster for Pfizer series) 03/06/2021    There are no preventive care reminders to display for this patient.  Lab Results  Component Value Date   TSH 0.93 10/28/2012   Lab Results  Component Value Date   WBC 6.2 05/12/2017   HGB 12.9 (L) 11/01/2020   HCT 38.0 (L) 11/01/2020   MCV 104.0 (H) 05/12/2017   PLT 160.0 05/12/2017   Lab Results  Component Value Date   NA 140 02/12/2021   K 4.1 02/12/2021   CO2 24 02/12/2021   GLUCOSE 118 (H) 02/12/2021   BUN 24 02/12/2021   CREATININE 1.50 (H) 02/12/2021   BILITOT 0.4 01/24/2021   ALKPHOS 81 01/24/2021   AST 20 01/24/2021   ALT 21 01/24/2021   PROT 6.6 01/24/2021   ALBUMIN 4.2 01/24/2021   CALCIUM 9.1 02/12/2021   GFR 47.34 (L) 02/01/2020   Lab Results  Component Value Date   CHOL 109 01/24/2021   Lab Results  Component Value Date   HDL 44 01/24/2021   Lab Results  Component Value Date   LDLCALC 48 01/24/2021   Lab Results  Component Value Date   TRIG 84 01/24/2021   Lab Results  Component Value Date   CHOLHDL 2.5 01/24/2021   No results found for: HGBA1C    Assessment & Plan:   #1 physical exam.  Health maintenance is basically up-to-date.  He is aged out of colonoscopies.  Had prior prostate cancer and had PSA few months ago which was stable.  No other screening labs indicated at this time.  Recent lipids and chemistries through cardiology -Continue with annual flu vaccine  #2 abdominal pain left lower quadrant.  Question  acute diverticulitis.  Patient nontoxic.  Abdominal exam benign but does have some tenderness in the sigmoid colon region  -Check CBC with differential -Start Cipro 500 mg twice daily for 10 days and Flagyl 500 mg 3 times daily for 10 days -Follow-up immediately for any fever, worsening pain, or other concerns.  CT abdomen and pelvis for any worsening or persistent pain  Meds ordered this encounter  Medications  . ciprofloxacin (CIPRO) 500 MG tablet    Sig: Take 1 tablet (500 mg total) by mouth 2 (two) times daily.    Dispense:  20 tablet    Refill:  0  . metroNIDAZOLE (FLAGYL) 500 MG tablet    Sig: Take 1 tablet (500 mg total) by mouth 3 (three) times daily.    Dispense:  30 tablet    Refill:  0    Follow-up: No follow-ups on file.    [name], MD

## 2021-03-06 NOTE — Patient Instructions (Signed)
Diverticulitis  Diverticulitis is infection or inflammation of small pouches (diverticula) in the colon that form due to a condition called diverticulosis. Diverticula can trap stool (feces) and bacteria, causing infection and inflammation. Diverticulitis may cause severe stomach pain and diarrhea. It may lead to tissue damage in the colon that causes bleeding or blockage. The diverticula may also burst (rupture) and cause infected stool to enter other areas of the abdomen. What are the causes? This condition is caused by stool becoming trapped in the diverticula, which allows bacteria to grow in the diverticula. This leads to inflammation and infection. What increases the risk? You are more likely to develop this condition if you have diverticulosis. The risk increases if you:  Are overweight or obese.  Do not get enough exercise.  Drink alcohol.  Use tobacco products.  Eat a diet that has a lot of red meat such as beef, pork, or lamb.  Eat a diet that does not include enough fiber. High-fiber foods include fruits, vegetables, beans, nuts, and whole grains.  Are over 40 years of age. What are the signs or symptoms? Symptoms of this condition may include:  Pain and tenderness in the abdomen. The pain is normally located on the left side of the abdomen, but it may occur in other areas.  Fever and chills.  Nausea.  Vomiting.  Cramping.  Bloating.  Changes in bowel routines.  Blood in your stool. How is this diagnosed? This condition is diagnosed based on:  Your medical history.  A physical exam.  Tests to make sure there is nothing else causing your condition. These tests may include: ? Blood tests. ? Urine tests. ? CT scan of the abdomen. How is this treated? Most cases of this condition are mild and can be treated at home. Treatment may include:  Taking over-the-counter pain medicines.  Following a clear liquid diet.  Taking antibiotic medicines by  mouth.  Resting. More severe cases may need to be treated at a hospital. Treatment may include:  Not eating or drinking.  Taking prescription pain medicine.  Receiving antibiotic medicines through an IV.  Receiving fluids and nutrition through an IV.  Surgery. When your condition is under control, your health care provider may recommend that you have a colonoscopy. This is an exam to look at the entire large intestine. During the exam, a lubricated, bendable tube is inserted into the anus and then passed into the rectum, colon, and other parts of the large intestine. A colonoscopy can show how severe your diverticula are and whether something else may be causing your symptoms. Follow these instructions at home: Medicines  Take over-the-counter and prescription medicines only as told by your health care provider. These include fiber supplements, probiotics, and stool softeners.  If you were prescribed an antibiotic medicine, take it as told by your health care provider. Do not stop taking the antibiotic even if you start to feel better.  Ask your health care provider if the medicine prescribed to you requires you to avoid driving or using machinery. Eating and drinking  Follow a full liquid diet or another diet as directed by your health care provider.  After your symptoms improve, your health care provider may tell you to change your diet. He or she may recommend that you eat a diet that contains at least 25 grams (25 g) of fiber daily. Fiber makes it easier to pass stool. Healthy sources of fiber include: ? Berries. One cup contains 4-8 grams of fiber. ? Beans   or lentils. One-half cup contains 5-8 grams of fiber. ? Green vegetables. One cup contains 4 grams of fiber.  Avoid eating red meat.   General instructions  Do not use any products that contain nicotine or tobacco, such as cigarettes, e-cigarettes, and chewing tobacco. If you need help quitting, ask your health care  provider.  Exercise for at least 30 minutes, 3 times each week. You should exercise hard enough to raise your heart rate and break a sweat.  Keep all follow-up visits as told by your health care provider. This is important. You may need to have a colonoscopy. Contact a health care provider if:  Your pain does not improve.  Your bowel movements do not return to normal. Get help right away if:  Your pain gets worse.  Your symptoms do not get better with treatment.  Your symptoms suddenly get worse.  You have a fever.  You vomit more than one time.  You have stools that are bloody, black, or tarry. Summary  Diverticulitis is infection or inflammation of small pouches (diverticula) in the colon that form due to a condition called diverticulosis. Diverticula can trap stool (feces) and bacteria, causing infection and inflammation.  You are at higher risk for this condition if you have diverticulosis and you eat a diet that does not include enough fiber.  Most cases of this condition are mild and can be treated at home. More severe cases may need to be treated at a hospital.  When your condition is under control, your health care provider may recommend that you have an exam called a colonoscopy. This exam can show how severe your diverticula are and whether something else may be causing your symptoms.  Keep all follow-up visits as told by your health care provider. This is important. This information is not intended to replace advice given to you by your health care provider. Make sure you discuss any questions you have with your health care provider. Document Revised: 08/30/2019 Document Reviewed: 08/30/2019 Elsevier Patient Education  2021 [employer]  

## 2021-03-08 ENCOUNTER — Telehealth: Payer: Self-pay | Admitting: Family Medicine

## 2021-03-08 DIAGNOSIS — E538 Deficiency of other specified B group vitamins: Secondary | ICD-10-CM

## 2021-03-08 DIAGNOSIS — R5383 Other fatigue: Secondary | ICD-10-CM

## 2021-03-08 NOTE — Telephone Encounter (Signed)
Spoke with the patient. He wanted to schedule his follow up labs. This has been scheduled and orders have been placed. Nothing further needed.

## 2021-03-08 NOTE — Telephone Encounter (Signed)
Patient is returning the call, please advise. CB is [phone]

## 2021-03-15 ENCOUNTER — Encounter (HOSPITAL_COMMUNITY): Payer: Self-pay | Admitting: Emergency Medicine

## 2021-03-15 ENCOUNTER — Other Ambulatory Visit: Payer: Self-pay

## 2021-03-15 ENCOUNTER — Emergency Department (HOSPITAL_COMMUNITY)
Admission: EM | Admit: 2021-03-15 | Discharge: 2021-03-15 | Disposition: A | Discharge status: Home / Self Care | Payer: Medicare HMO | Attending: Emergency Medicine | Admitting: Emergency Medicine

## 2021-03-15 DIAGNOSIS — Z951 Presence of aortocoronary bypass graft: Secondary | ICD-10-CM | POA: Insufficient documentation

## 2021-03-15 DIAGNOSIS — I10 Essential (primary) hypertension: Secondary | ICD-10-CM | POA: Diagnosis not present

## 2021-03-15 DIAGNOSIS — E860 Dehydration: Secondary | ICD-10-CM | POA: Diagnosis not present

## 2021-03-15 DIAGNOSIS — R197 Diarrhea, unspecified: Secondary | ICD-10-CM | POA: Diagnosis not present

## 2021-03-15 DIAGNOSIS — N183 Chronic kidney disease, stage 3 unspecified: Secondary | ICD-10-CM | POA: Insufficient documentation

## 2021-03-15 DIAGNOSIS — I129 Hypertensive chronic kidney disease with stage 1 through stage 4 chronic kidney disease, or unspecified chronic kidney disease: Secondary | ICD-10-CM | POA: Insufficient documentation

## 2021-03-15 DIAGNOSIS — N179 Acute kidney failure, unspecified: Secondary | ICD-10-CM | POA: Diagnosis not present

## 2021-03-15 DIAGNOSIS — Z8546 Personal history of malignant neoplasm of prostate: Secondary | ICD-10-CM | POA: Insufficient documentation

## 2021-03-15 DIAGNOSIS — Z87891 Personal history of nicotine dependence: Secondary | ICD-10-CM | POA: Insufficient documentation

## 2021-03-15 DIAGNOSIS — Z8551 Personal history of malignant neoplasm of bladder: Secondary | ICD-10-CM | POA: Diagnosis not present

## 2021-03-15 DIAGNOSIS — Z7982 Long term (current) use of aspirin: Secondary | ICD-10-CM | POA: Insufficient documentation

## 2021-03-15 DIAGNOSIS — Z79899 Other long term (current) drug therapy: Secondary | ICD-10-CM | POA: Insufficient documentation

## 2021-03-15 DIAGNOSIS — I2510 Atherosclerotic heart disease of native coronary artery without angina pectoris: Secondary | ICD-10-CM | POA: Diagnosis not present

## 2021-03-15 LAB — URINALYSIS, ROUTINE W REFLEX MICROSCOPIC
Bacteria, UA: NONE SEEN
Bilirubin Urine: NEGATIVE
Glucose, UA: NEGATIVE mg/dL
Ketones, ur: NEGATIVE mg/dL
Leukocytes,Ua: NEGATIVE
Nitrite: NEGATIVE
Protein, ur: NEGATIVE mg/dL
Specific Gravity, Urine: 1.003 — ABNORMAL LOW (ref 1.005–1.030)
pH: 7 (ref 5.0–8.0)

## 2021-03-15 LAB — C DIFFICILE QUICK SCREEN W PCR REFLEX
C Diff antigen: NEGATIVE
C Diff interpretation: NOT DETECTED
C Diff toxin: NEGATIVE

## 2021-03-15 LAB — CBC
HCT: 36.3 % — ABNORMAL LOW (ref 39.0–52.0)
Hemoglobin: 11.9 g/dL — ABNORMAL LOW (ref 13.0–17.0)
MCH: 35.8 pg — ABNORMAL HIGH (ref 26.0–34.0)
MCHC: 32.8 g/dL (ref 30.0–36.0)
MCV: 109.3 fL — ABNORMAL HIGH (ref 80.0–100.0)
Platelets: 139 10*3/uL — ABNORMAL LOW (ref 150–400)
RBC: 3.32 MIL/uL — ABNORMAL LOW (ref 4.22–5.81)
RDW: 12.4 % (ref 11.5–15.5)
WBC: 5.5 10*3/uL (ref 4.0–10.5)
nRBC: 0 % (ref 0.0–0.2)

## 2021-03-15 LAB — COMPREHENSIVE METABOLIC PANEL
ALT: 27 U/L (ref 0–44)
AST: 27 U/L (ref 15–41)
Albumin: 3.7 g/dL (ref 3.5–5.0)
Alkaline Phosphatase: 53 U/L (ref 38–126)
Anion gap: 5 (ref 5–15)
BUN: 22 mg/dL (ref 8–23)
CO2: 26 mmol/L (ref 22–32)
Calcium: 9.2 mg/dL (ref 8.9–10.3)
Chloride: 106 mmol/L (ref 98–111)
Creatinine, Ser: 1.71 mg/dL — ABNORMAL HIGH (ref 0.61–1.24)
GFR, Estimated: 39 mL/min — ABNORMAL LOW (ref >60)
Glucose, Bld: 102 mg/dL — ABNORMAL HIGH (ref 70–99)
Potassium: 4.1 mmol/L (ref 3.5–5.1)
Sodium: 137 mmol/L (ref 135–145)
Total Bilirubin: 0.6 mg/dL (ref 0.3–1.2)
Total Protein: 6.2 g/dL — ABNORMAL LOW (ref 6.5–8.1)

## 2021-03-15 LAB — LIPASE, BLOOD: Lipase: 75 U/L — ABNORMAL HIGH (ref 11–51)

## 2021-03-15 MED ORDER — SODIUM CHLORIDE 0.9 % IV BOLUS
1000.0000 mL | Freq: Once | INTRAVENOUS | Status: AC
  Administered 2021-03-15: 1000 mL via INTRAVENOUS

## 2021-03-15 NOTE — ED Notes (Addendum)
Pt ambulated to bathroom and was a little dizzy once standing up from toilet but was okay after laying back down.

## 2021-03-15 NOTE — ED Provider Notes (Signed)
[facility] EMERGENCY DEPARTMENT Provider Note   CSN: [id number] Arrival date & time: 03/15/21  1009     History Chief Complaint  Patient presents with  . Dizziness    [name] is a 85 y.o. male.  The history is provided by the patient and medical records. No language interpreter was used.  Diarrhea Quality:  Watery (and dark) Severity:  Severe Onset quality:  Gradual Duration:  1 week Timing:  Constant Progression:  Unchanged Relieved by:  Nothing Worsened by:  Nothing Ineffective treatments:  None tried Associated symptoms: no abdominal pain (resolved), no chills, no recent cough, no diaphoresis, no fever, no headaches and no vomiting   Risk factors: recent antibiotic use (cipro and fyagyl)        Past Medical History:  Diagnosis Date  . AAA (abdominal aortic aneurysm) ([place])   . AAA (abdominal aortic aneurysm) (HCC)    4-4 cm intrarenal aaa per US aorta 03-28-2019 epic  . Benign positional vertigo   . Bladder cancer ([place])   . Bladder neck contracture   . Coronary artery disease CARDIOLOGIST--  DR Acie Fredrickson  . ED (erectile dysfunction)   . GERD (gastroesophageal reflux disease)   . H/O hiatal hernia   . History of anal fissures   . History of colon polyps   . History of radiation therapy 08/24/12-10/22/12   prostate 6600cGy/33 sessions--  for biochemical recurrence  . Hyperlipidemia   . Hypertension   . OA (osteoarthritis)   . Peripheral vascular disease ([place])   . Recurrent prostate adenocarcinoma ([place]) first dx 09/2005--  radial prostatectomy-- gleason 4+3=7, pT2c, negative margin   04/2012--  BIOCHEMICAL  RECURRENCE to 0.33 /  [facility] IMRT SEPT to  NOV 2013  . S/P CABG x 2    09/  2014  . S/P dilatation of esophageal stricture    1992  . Sciatica    both hips at times mostly left hip  . Sigmoid diverticulosis   . SUI (stress urinary incontinence), male   . Urethral stricture   . Wears glasses     Patient Active Problem List    Diagnosis Date Noted  . CKD (chronic kidney disease) stage 3, GFR 30-59 ml/min (HCC) 09/28/2019  . Visual disturbance 01/07/2017  . Adjustment disorder with depressed mood 01/24/2015  . S/P CABG x 2 10/12/2013  . Vitamin B 12 deficiency 01/19/2013  . Abdominal aortic aneurysm ([place]) 06/02/2012  . ACUTE SINUSITIS, UNSPECIFIED 11/27/2010  . Hyperlipidemia 04/06/2010  . BENIGN POSITIONAL VERTIGO 04/06/2010  . Essential hypertension 04/06/2010  . CAD (coronary artery disease) 04/06/2010  . GERD 04/06/2010  . SKIN LESION 04/06/2010  . OSTEOARTHRITIS, GENERALIZED 04/06/2010  . Prostate cancer ([place]) 12/03/2004    Past Surgical History:  Procedure Laterality Date  . CARDIAC CATHETERIZATION  08-27-2013  DR Martinique   CRITICAL OSTIAL LM STENOSIS/  diffuse disease LAD 40-50%  &   pLCx 30-40%/  . CARDIOVASCULAR STRESS TEST  08-18-2013  DR NASHER   SMALL/ MEDIUM AREA MILD SCAR INFEROSEPTAL WALL/ NO ISCHEMIA/  LOW RISK SCAN/  EF 62%/  LV WALL MOTION WITH SLIGHT DECREASED OF THE SEPTUM  . CORONARY ANGIOPLASTY  1991   PTCA  of LEFT CFX  . CORONARY ARTERY BYPASS GRAFT N/A 08/27/2013   Procedure: CORONARY ARTERY BYPASS GRAFTING (CABG) times two using left internal mammary and right saphenous vein using endoscope.;  Surgeon: Melrose Nakayama, MD;  Location: [facility];  Service: Open Heart Surgery;  Laterality: N/A;  .  CYSTOSCOPY WITH URETHRAL DILATATION N/A 01/26/2014   Procedure: CYSTOSCOPY WITH URETHRAL DILATATION, BLADDER BIOPSY, FOLEY CATHETER;  Surgeon: Molli Hazard, MD;  Location: [facility];  Service: Urology;  Laterality: N/A;  . INGUINAL HERNIA REPAIR Bilateral 1994  . KNEE ARTHROSCOPY Right yrs ago  . LEFT HEART CATHETERIZATION WITH CORONARY ANGIOGRAM N/A 08/27/2013   Procedure: LEFT HEART CATHETERIZATION WITH CORONARY ANGIOGRAM;  Surgeon: Peter M Martinique, MD;  Location: [facility] CATH LAB;  Service: Cardiovascular;  Laterality: N/A;  . LUMBAR DISC SURGERY  1980'S  . RADICAL RETROPUBIC PROSTATECTOMY  W/ BILATERAL PELVIC NODE DISSECTION  11-18-2005  . TRANSURETHRAL INCISION OF PROSTATE N/A 11/01/2020   Procedure: TRANSURETHRAL INCISION OF THE BLADDER NECK;  Surgeon: Ardis Hughs, MD;  Location: [facility];  Service: Urology;  Laterality: N/A;  . URETHROTOMY N/A 05/30/2014   Procedure: CYSTOSCOPY  BALLOON DILATION AND DIRECT VISION INTERNAL URETHROTOMY;  Surgeon: Sharyn Creamer, MD;  Location: [facility];  Service: Urology;  Laterality: N/A;       Family History  Problem Relation Age of Onset  . Cancer Father        prostate died 51 something  . Cancer Brother        prostate cancer  . Arthritis Other   . Hyperlipidemia Other   . Hypertension Other     Social History   Tobacco Use  . Smoking status: Former Smoker    Packs/day: 2.00    Years: 10.00    Pack years: 20.00    Types: Cigarettes    Quit date: 12/03/1994    Years since quitting: 26.2  . Smokeless tobacco: Never Used  Vaping Use  . Vaping Use: Never used  Substance Use Topics  . Alcohol use: Yes    Comment: occasionally  . Drug use: No    Home Medications Prior to Admission medications   Medication Sig Start Date End Date Taking? Authorizing Provider  aspirin 81 MG EC tablet Take 1 tablet (81 mg total) by mouth daily. 05/11/14   Nahser, Wonda Cheng, MD  atorvastatin (LIPITOR) 40 MG tablet Take one tablet by mouth once daily. 08/09/20   Burchette, Alinda Sierras, MD  carvedilol (COREG) 12.5 MG tablet TAKE 1 TABLET BY MOUTH TWICE A DAY 12/12/20   Nahser, Wonda Cheng, MD  ciprofloxacin (CIPRO) 500 MG tablet Take 1 tablet (500 mg total) by mouth 2 (two) times daily. 03/06/21   Burchette, Alinda Sierras, MD  famotidine (PEPCID) 20 MG tablet Take 20 mg by mouth as needed for heartburn or indigestion.    [provider]  furosemide (LASIX) 20 MG tablet Take 1 tablet (20 mg total) by mouth 3 (three) times a week. On Monday, Wednesday, Friday 07/31/20   Nahser, Wonda Cheng, MD  ibuprofen (ADVIL) 200  MG tablet Take 200 mg by mouth every 6 (six) hours as needed.    [provider]  latanoprost (XALATAN) 0.005 % ophthalmic solution Place 1 drop into both eyes at bedtime.     [provider]  losartan (COZAAR) 50 MG tablet Take 1 tablet (50 mg total) by mouth daily. 01/29/21   Nahser, Wonda Cheng, MD  metroNIDAZOLE (FLAGYL) 500 MG tablet Take 1 tablet (500 mg total) by mouth 3 (three) times daily. 03/06/21   Burchette, Alinda Sierras, MD  Multiple Vitamin (MULTIVITAMIN WITH MINERALS) TABS tablet Take 1 tablet by mouth daily.    [provider]  nitroGLYCERIN (NITROSTAT) 0.4 MG SL tablet PLACE 1 TABLET UNDER THE TONUE  EVERY 5 MINUTES AS NEEDED FOR CHEST PAIN. 01/13/17   Nahser, Wonda Cheng, MD  polyethylene glycol [facility] / Floria Raveling) packet Take 17 g by mouth daily as needed (for constipation).     [provider]  sertraline (ZOLOFT) 50 MG tablet TAKE 1 TABLET BY MOUTH EVERY DAY Patient taking differently: daily. 08/12/20   Burchette, Alinda Sierras, MD  traMADol (ULTRAM) 50 MG tablet Take 1-2 tablets (50-100 mg total) by mouth every 6 (six) hours as needed for moderate pain. 11/01/20   Ardis Hughs, MD  vitamin B-12 (CYANOCOBALAMIN) 100 MCG tablet Take 100 mcg by mouth daily.    [provider]    Allergies    Bee venom, Amoxicillin, and Hydrocodone  Review of Systems   Review of Systems  Constitutional: Positive for fatigue. Negative for chills, diaphoresis and fever.  HENT: Negative for congestion.   Eyes: Negative for visual disturbance.  Respiratory: Negative for cough, chest tightness, shortness of breath and wheezing.   Cardiovascular: Negative for palpitations and leg swelling.  Gastrointestinal: Positive for diarrhea. Negative for abdominal distention, abdominal pain (resolved), constipation, nausea and vomiting.  Genitourinary: Positive for frequency. Negative for dysuria and flank pain.  Musculoskeletal: Negative for back pain, neck pain and neck  stiffness.  Skin: Negative for wound.  Neurological: Positive for light-headedness. Negative for dizziness, syncope, numbness and headaches.  Psychiatric/Behavioral: Negative for agitation and confusion.  All other systems reviewed and are negative.   Physical Exam Updated Vital Signs BP (!) 147/60 (BP Location: Left Arm)   Pulse 62   Temp 98.2 F (36.8 C) (Oral)   Resp 16   SpO2 100%   Physical Exam Vitals and nursing note reviewed.  Constitutional:      General: He is not in acute distress.    Appearance: He is well-developed. He is not ill-appearing, toxic-appearing or diaphoretic.  HENT:     Head: Normocephalic and atraumatic.     Mouth/Throat:     Mouth: Mucous membranes are dry.     Pharynx: No oropharyngeal exudate or posterior oropharyngeal erythema.  Eyes:     Extraocular Movements: Extraocular movements intact.     Conjunctiva/sclera: Conjunctivae normal.     Pupils: Pupils are equal, round, and reactive to light.  Cardiovascular:     Rate and Rhythm: Normal rate and regular rhythm.     Pulses: Normal pulses.     Heart sounds: No murmur heard.   Pulmonary:     Effort: Pulmonary effort is normal. No respiratory distress.     Breath sounds: Normal breath sounds. No wheezing or rales.  Chest:     Chest wall: No tenderness.  Abdominal:     General: Abdomen is flat.     Palpations: Abdomen is soft.     Tenderness: There is no abdominal tenderness. There is no right CVA tenderness, left CVA tenderness, guarding or rebound.  Musculoskeletal:        General: No tenderness.     Cervical back: Neck supple. No tenderness.     Right lower leg: No edema.     Left lower leg: No edema.  Skin:    General: Skin is warm and dry.     Capillary Refill: Capillary refill takes less than 2 seconds.     Coloration: Skin is not pale.     Findings: No erythema or rash.  Neurological:     General: No focal deficit present.     Mental Status: He is alert. Mental status is at  baseline.  Psychiatric:        Mood and Affect: Mood normal.     ED Results / Procedures / Treatments   Labs (all labs ordered are listed, but only abnormal results are displayed) Labs Reviewed  LIPASE, BLOOD - Abnormal; Notable for the following components:      Result Value   Lipase 75 (*)    All other components within normal limits  COMPREHENSIVE METABOLIC PANEL - Abnormal; Notable for the following components:   Glucose, Bld 102 (*)    Creatinine, Ser 1.71 (*)    Total Protein 6.2 (*)    GFR, Estimated 39 (*)    All other components within normal limits  CBC - Abnormal; Notable for the following components:   RBC 3.32 (*)    Hemoglobin 11.9 (*)    HCT 36.3 (*)    MCV 109.3 (*)    MCH 35.8 (*)    Platelets 139 (*)    All other components within normal limits  URINALYSIS, ROUTINE W REFLEX MICROSCOPIC - Abnormal; Notable for the following components:   Color, Urine STRAW (*)    Specific Gravity, Urine 1.003 (*)    Hgb urine dipstick MODERATE (*)    All other components within normal limits  C DIFFICILE QUICK SCREEN W PCR REFLEX  URINE CULTURE    EKG EKG Interpretation  Date/Time:  Thursday March 15 2021 10:24:52 EDT Ventricular Rate:  66 PR Interval:  232 QRS Duration: 78 QT Interval:  396 QTC Calculation: 415 R Axis:   98 Text Interpretation: Sinus rhythm with 1st degree A-V block with Premature atrial complexes in a pattern of bigeminy Rightward axis Borderline ECG When compared to prior, similar appearance to prior. No STEMI Confirmed by Antony Blackbird [phone]) on 03/15/2021 11:24:36 AM   Radiology No results found.  Procedures Procedures   Medications Ordered in ED Medications  sodium chloride 0.9 % bolus 1,000 mL (0 mLs Intravenous Stopped 03/15/21 1309)    ED Course  I have reviewed the triage vital signs and the nursing notes.  Pertinent labs & imaging results that were available during my care of the patient were reviewed by me and considered  in my medical decision making (see chart for details).    MDM Rules/Calculators/A&P                          [name] is a 85 y.o. male with a past medical history significant for known AAA, CAD with CABG, hypertension, hyperlipidemia, CKD, prostate and bladder cancer, and currently on antibiotics for diverticulitis who presents with hypotension at home with associated lightheadedness and fatigue with 1 week of diarrhea.  Patient reports that he was started on antibiotics almost 2 weeks ago for left lower quadrant abdominal pain that his PCP thought was diverticulitis.  He was placed on Cipro and Flagyl and has been taking it as directed.  He reports that since then, he has had diarrhea that is watery and dark at times.  He reports that today, he checked his blood pressures at home and got readings that were down into the 89Q and 11H systolic.  He felt like he might pass out and was feeling lightheaded so he decided to come get evaluated.  He denies any abdominal pain at this time and reports that improved quickly after starting antibiotics.  He denies any chest pain or shortness of breath.  He denies any syncopal episode.  He denies any  trauma.  He reports that he always has "some urine troubles" due to his cancer.  He says that he has been urinating frequently but it is not burning.  On arrival, blood pressure is normotensive or slightly hypertensive with blood pressure in the 140s.  He is not feeling lightheaded anymore and is denying any pain.  On exam, lungs clear and chest nontender.  Abdomen nontender.  Normal bowel sounds.  No flank tenderness or back tenderness.  No lightheadedness when he sat up to auscultate his lungs.  Patient resting comfortably.  Given his recent antibiotic use and new diarrhea, we will get a C. difficile test sent if he is able to produce a stool sample for [place].  We will get screening labs including hemoglobin given the dark color of the diarrhea although I do  anticipate it is blood given the recent diverticulitis.  We will give some fluids as I suspect he is dehydrated from 1 week of diarrhea leading to dehydration and these episodes of lightheadedness and soft pressures earlier.  Given his lack of abdominal pain, have low suspicion that we need to do repeat imaging for abscess, perforation, or other new abnormality in the abdomen.  Will give fluids and reassess, dissipate discharge if work-up is reassuring and blood pressure remained stable.  After over 5 hours of monitoring, patient was not hypotensive.  He was feeling better after some fluids.  Urinalysis does not show infection and aside from AKI, labs are reassuring.  C. difficile test was collected and was sent off.  If it is positive, anticipate he will be called in antibiotics.  Lipase slightly elevated we discussed but doubt acute pancreatitis given lack of abdominal pain at this time.  Patient will continue hydration at home and will follow up with his PCP for repeat creatinine in several days.  He understood return precautions and follow-up instructions and after no evidence of hypotension, was felt stable for discharge home.  Final Clinical Impression(s) / ED Diagnoses Final diagnoses:  Dehydration  Diarrhea, unspecified type  AKI (acute kidney injury) ([place])    Rx / DC Orders ED Discharge Orders    None     Clinical Impression: 1. Dehydration   2. Diarrhea, unspecified type   3. AKI (acute kidney injury) ([place])     Disposition: Discharge  Condition: Good  I have discussed the results, Dx and Tx plan with the pt(& family if present). He/she/they expressed understanding and agree(s) with the plan. Discharge instructions discussed at great length. Strict return precautions discussed and pt &/or family have verbalized understanding of the instructions. No further questions at time of discharge.    Discharge Medication List as of 03/15/2021  3:36 PM      Follow Up: [name], MD [place] [address] [place] EMERGENCY DEPARTMENT [address] [id number] mc [place]       Shevonne Wolf, Gwenyth Allegra, MD 03/15/21 1630

## 2021-03-15 NOTE — Discharge Instructions (Signed)
Your history and exam today are consistent with diarrhea likely related to the recent diverticulitis and subsequent antibiotic use.  We did send a C. difficile test off, if this is positive, you will be called and call in antibiotics.  We did not see any low blood pressures as you reported from home today and after fluids, your blood pressure remains normal or elevated.  With your lack of abdominal pain, we did not feel he needed imaging today and your labs were also overall reassuring aside from the elevated creatinine we discussed from dehydration.  Please follow-up with your primary doctor to have repeat check of your creatinine and reassessment.  If any symptoms change or worsen, please return to the nearest emergency department.

## 2021-03-15 NOTE — ED Triage Notes (Signed)
Patient complains of diarrhea and dizziness that started approximately one week ago. Patient decided to come to ED when home BP equipment showed low blood pressure this morning. Patient alert, oriented, and in no apparent distress at this time. BP 147/60 in triage.

## 2021-03-16 LAB — URINE CULTURE: Culture: NO GROWTH

## 2021-03-26 ENCOUNTER — Other Ambulatory Visit: Payer: Self-pay

## 2021-03-26 DIAGNOSIS — I714 Abdominal aortic aneurysm, without rupture, unspecified: Secondary | ICD-10-CM

## 2021-03-27 ENCOUNTER — Other Ambulatory Visit: Payer: Self-pay

## 2021-03-28 ENCOUNTER — Other Ambulatory Visit (INDEPENDENT_AMBULATORY_CARE_PROVIDER_SITE_OTHER): Payer: Medicare HMO

## 2021-03-28 DIAGNOSIS — R5383 Other fatigue: Secondary | ICD-10-CM

## 2021-03-28 DIAGNOSIS — E538 Deficiency of other specified B group vitamins: Secondary | ICD-10-CM | POA: Diagnosis not present

## 2021-03-28 LAB — TSH: TSH: 1.45 u[IU]/mL (ref 0.35–4.50)

## 2021-03-28 LAB — VITAMIN B12: Vitamin B-12: 947 pg/mL — ABNORMAL HIGH (ref 211–911)

## 2021-04-04 ENCOUNTER — Other Ambulatory Visit: Payer: Self-pay

## 2021-04-04 ENCOUNTER — Ambulatory Visit (HOSPITAL_COMMUNITY)
Admission: RE | Admit: 2021-04-04 | Discharge: 2021-04-04 | Disposition: A | Discharge status: Home / Self Care | Payer: Medicare HMO | Source: Ambulatory Visit | Attending: Vascular Surgery | Admitting: Vascular Surgery

## 2021-04-04 ENCOUNTER — Encounter: Payer: Self-pay | Admitting: Vascular Surgery

## 2021-04-04 ENCOUNTER — Ambulatory Visit: Payer: Medicare HMO | Admitting: Vascular Surgery

## 2021-04-04 VITALS — BP 133/73 | HR 55 | Temp 98.2°F | Resp 20 | Ht 68.0 in | Wt 136.7 lb

## 2021-04-04 DIAGNOSIS — I714 Abdominal aortic aneurysm, without rupture, unspecified: Secondary | ICD-10-CM

## 2021-04-04 NOTE — Progress Notes (Signed)
REASON FOR VISIT:   Follow-up of abdominal aortic aneurysm.  MEDICAL ISSUES:   ABDOMINAL AORTIC ANEURYSM: His infrarenal abdominal aortic aneurysm is stable in size at 4.5 cm.  I explained that in a normal risk patient we would consider elective repair at 5.5 cm.  Fortunately he is not a smoker.  His blood pressures under good control.  I think we can stretch his follow-up out to 9 months and I have ordered a follow-up carotid duplex scan in 9 months.  I will see him at that time.  He knows to call sooner if he has problems.  RIGHT COMMON ILIAC ARTERY STENOSIS: Based on his duplex scan he does have evidence of a right common iliac artery stenosis.  He has a slightly diminished femoral pulse on the right.  He does have some hip and thigh pain on the right but also has problems with degenerative disc disease of his back, sciatica, and possibly some arthritis in his hips.  He has symptoms on both sides so currently I do not think the right common iliac artery stenosis is especially symptomatic although it may be contributing some to his symptoms.  If his symptoms worsen and certainly we could consider arteriography.  I will get ABIs when he returns in 9 months to have his a baseline.  STAGE III CHRONIC KIDNEY DISEASE: Based on his labs his kidney function has remained stable.  Fortunately we can follow his aneurysm simply with duplex for the time being.   HPI:   [name] is a pleasant 85 y.o. male who I last saw on 09/27/2020 with a 4.4 cm infrarenal abdominal aortic aneurysm.  I explained that we would normally consider elective repair at 5.5 cm in a normal risk patient.  Fortunately he was not a smoker.  His blood pressure was under good control.  I set him up for a 68-month follow-up visit.  Since I saw him last he has had no significant abdominal pain.  He does have some chronic back pain and also some hip pain bilaterally.  In addition he tells me he has a history of sciatica.  I do  not get any clear-cut history of claudication.  His symptoms occur both with standing and also with ambulation and his hips.  I do not get any history of rest pain or nonhealing ulcers.  He quit smoking in 1996.  Past Medical History:  Diagnosis Date  . AAA (abdominal aortic aneurysm) ([place])   . AAA (abdominal aortic aneurysm) ([facility])    4-4 cm intrarenal aaa per US aorta 03-28-2019 epic  . Benign positional vertigo   . Bladder cancer ([place])   . Bladder neck contracture   . Coronary artery disease CARDIOLOGIST--  DR Acie Fredrickson  . ED (erectile dysfunction)   . GERD (gastroesophageal reflux disease)   . H/O hiatal hernia   . History of anal fissures   . History of colon polyps   . History of radiation therapy 08/24/12-10/22/12   prostate 6600cGy/33 sessions--  for biochemical recurrence  . Hyperlipidemia   . Hypertension   . OA (osteoarthritis)   . Peripheral vascular disease ([place])   . Recurrent prostate adenocarcinoma ([place]) first dx 09/2005--  radial prostatectomy-- gleason 4+3=7, pT2c, negative margin   04/2012--  BIOCHEMICAL  RECURRENCE to 0.33 /  SALVAGE IMRT SEPT to  NOV 2013  . S/P CABG x 2    09/  2014  . S/P dilatation of esophageal stricture    1992  .  Sciatica    both hips at times mostly left hip  . Sigmoid diverticulosis   . SUI (stress urinary incontinence), male   . Urethral stricture   . Wears glasses     Family History  Problem Relation Age of Onset  . Cancer Father        prostate died 79 something  . Cancer Brother        prostate cancer  . Arthritis Other   . Hyperlipidemia Other   . Hypertension Other     SOCIAL HISTORY: Social History   Tobacco Use  . Smoking status: Former Smoker    Packs/day: 2.00    Years: 10.00    Pack years: 20.00    Types: Cigarettes    Quit date: 12/03/1994    Years since quitting: 26.3  . Smokeless tobacco: Never Used  Substance Use Topics  . Alcohol use: Yes    Comment: occasionally    Allergies  Allergen Reactions   . Bee Venom Other (See Comments)    Reaction swelling  . Amoxicillin Rash  . Hydrocodone Other (See Comments)    REACTION: disorientation    Current Outpatient Medications  Medication Sig Dispense Refill  . aspirin 81 MG EC tablet Take 1 tablet (81 mg total) by mouth daily.    [name] atorvastatin (LIPITOR) 40 MG tablet Take one tablet by mouth once daily. 90 tablet 3  . carvedilol (COREG) 12.5 MG tablet TAKE 1 TABLET BY MOUTH TWICE A DAY 180 tablet 2  . ciprofloxacin (CIPRO) 500 MG tablet Take 1 tablet (500 mg total) by mouth 2 (two) times daily. 20 tablet 0  . famotidine (PEPCID) 20 MG tablet Take 20 mg by mouth as needed for heartburn or indigestion.    . furosemide (LASIX) 20 MG tablet Take 1 tablet (20 mg total) by mouth 3 (three) times a week. On Monday, Wednesday, Friday 45 tablet 3  . latanoprost (XALATAN) 0.005 % ophthalmic solution Place 1 drop into both eyes at bedtime.     [name] losartan (COZAAR) 50 MG tablet Take 1 tablet (50 mg total) by mouth daily. 90 tablet 3  . Multiple Vitamin (MULTIVITAMIN WITH MINERALS) TABS tablet Take 1 tablet by mouth daily.    . nitroGLYCERIN (NITROSTAT) 0.4 MG SL tablet PLACE 1 TABLET UNDER THE TONUE EVERY 5 MINUTES AS NEEDED FOR CHEST PAIN. 75 tablet 3  . polyethylene glycol (MIRALAX / GLYCOLAX) packet Take 17 g by mouth daily as needed (for constipation).     . sertraline (ZOLOFT) 50 MG tablet TAKE 1 TABLET BY MOUTH EVERY DAY (Patient taking differently: daily.) 90 tablet 0  . traMADol (ULTRAM) 50 MG tablet Take 1-2 tablets (50-100 mg total) by mouth every 6 (six) hours as needed for moderate pain. 15 tablet 0  . ibuprofen (ADVIL) 200 MG tablet Take 200 mg by mouth every 6 (six) hours as needed. (Patient not taking: Reported on 04/04/2021)    . vitamin B-12 (CYANOCOBALAMIN) 100 MCG tablet Take 100 mcg by mouth daily. (Patient not taking: Reported on 04/04/2021)     No current facility-administered medications for this visit.    REVIEW OF SYSTEMS:  [X]   denotes positive finding, [ ]  denotes negative finding Cardiac  Comments:  Chest pain or chest pressure:    Shortness of breath upon exertion:    Short of breath when lying flat:    Irregular heart rhythm:        Vascular    Pain in calf, thigh, or hip brought  on by ambulation:    Pain in feet at night that wakes you up from your sleep:     Blood clot in your veins:    Leg swelling:         Pulmonary    Oxygen at home:    Productive cough:     Wheezing:         Neurologic    Sudden weakness in arms or legs:     Sudden numbness in arms or legs:     Sudden onset of difficulty speaking or slurred speech:    Temporary loss of vision in one eye:     Problems with dizziness:  x       Gastrointestinal    Blood in stool:     Vomited blood:         Genitourinary    Burning when urinating:     Blood in urine:        Psychiatric    Major depression:         Hematologic    Bleeding problems:    Problems with blood clotting too easily:        Skin    Rashes or ulcers:        Constitutional    Fever or chills:     PHYSICAL EXAM:   Vitals:   04/04/21 0932  BP: 133/73  Pulse: (!) 55  Resp: 20  Temp: 98.2 F (36.8 C)  SpO2: 98%  Weight: 136 lb 11.2 oz (62 kg)  Height: 5\' 8"  (1.727 m)    GENERAL: The patient is a well-nourished male, in no acute distress. The vital signs are documented above. CARDIAC: There is a regular rate and rhythm.  VASCULAR: I do not detect carotid bruits. He has a slightly diminished right femoral pulse with a normal left femoral pulse. I cannot palpate pedal pulses however both feet are warm and well-perfused. He has no significant lower extremity swelling. PULMONARY: There is good air exchange bilaterally without wheezing or rales. ABDOMEN: Soft and non-tender with normal pitched bowel sounds.  His aneurysm is palpable and nontender. MUSCULOSKELETAL: There are no major deformities or cyanosis. NEUROLOGIC: No focal weakness or paresthesias  are detected. SKIN: There are no ulcers or rashes noted. PSYCHIATRIC: The patient has a normal affect.  DATA:    DUPLEX ABDOMINAL AORTA: I have independently interpreted the duplex of his abdominal aorta.  The maximum diameter of his infrarenal aorta is 4.5 cm.  The right common iliac artery measures 1.3 cm in maximum diameter.  The left common iliac artery measures 1.5 cm in maximum diameter.  LABS: I reviewed his labs from 03/15/2021.  His GFR was 39.  Creatinine 1.7.  Deitra Mayo [facility] [phone]

## 2021-04-05 ENCOUNTER — Other Ambulatory Visit: Payer: Self-pay

## 2021-04-05 DIAGNOSIS — I714 Abdominal aortic aneurysm, without rupture, unspecified: Secondary | ICD-10-CM

## 2021-04-27 ENCOUNTER — Telehealth: Payer: Self-pay | Admitting: Family Medicine

## 2021-04-27 NOTE — Telephone Encounter (Signed)
Pt called back and has been scheduled.

## 2021-04-27 NOTE — Telephone Encounter (Signed)
Left message for patient to call back and schedule Medicare Annual Wellness Visit (AWV) either virtually or in office.   Last AWV 09/28/2019  please schedule at anytime with [facility] Nurse Health Advisor 1 or 2   This should be a 45 minute visit.

## 2021-04-27 NOTE — Telephone Encounter (Signed)
Noted  

## 2021-05-08 ENCOUNTER — Ambulatory Visit (INDEPENDENT_AMBULATORY_CARE_PROVIDER_SITE_OTHER): Payer: Medicare HMO

## 2021-05-08 ENCOUNTER — Other Ambulatory Visit: Payer: Self-pay

## 2021-05-08 VITALS — BP 110/62 | HR 67 | Temp 98.2°F | Resp 20 | Wt 135.7 lb

## 2021-05-08 DIAGNOSIS — Z0000 Encounter for general adult medical examination without abnormal findings: Secondary | ICD-10-CM | POA: Diagnosis not present

## 2021-05-08 NOTE — Progress Notes (Addendum)
Subjective:   [name] is a 85 y.o. male who presents for Medicare Annual/Subsequent preventive examination.  Review of Systems     Cardiac Risk Factors include: advanced age (>7men, >52 women);hypertension;dyslipidemia;male gender     Objective:    Today's Vitals   05/08/21 1113  BP: 110/62  Pulse: 67  Resp: 20  Temp: 98.2 F (36.8 C)  SpO2: 99%  Weight: 135 lb 11.2 oz (61.6 kg)   Body mass index is 20.63 kg/m.  Advanced Directives 05/08/2021 03/15/2021 09/27/2020 06/24/2018 05/30/2014 01/24/2014 08/27/2013  Does Patient Have a Medical Advance Directive? Yes No Yes Yes Patient has advance directive, copy not in chart Patient has advance directive, copy in chart Patient has advance directive, copy not in chart  Type of Advance Directive Healthcare Power of [place];Living will - Living will Living will Living will  Does patient want to make changes to medical advance directive? - - No - Patient declined - No change requested - -  Copy of [place] in Chart? No - copy requested - - - (No Data) - Copy requested from family  Pre-existing out of facility DNR order (yellow form or pink MOST form) - - - - - - No    Current Medications (verified) Outpatient Encounter Medications as of 05/08/2021  Medication Sig  . aspirin 81 MG EC tablet Take 1 tablet (81 mg total) by mouth daily.  [name] atorvastatin (LIPITOR) 40 MG tablet Take one tablet by mouth once daily.  . carvedilol (COREG) 12.5 MG tablet TAKE 1 TABLET BY MOUTH TWICE A DAY  . famotidine (PEPCID) 20 MG tablet Take 20 mg by mouth as needed for heartburn or indigestion.  . furosemide (LASIX) 20 MG tablet Take 1 tablet (20 mg total) by mouth 3 (three) times a week. On Monday, Wednesday, Friday  . latanoprost (XALATAN) 0.005 % ophthalmic solution Place 1 drop into both eyes at bedtime.   [name] losartan (COZAAR) 50 MG tablet Take 1 tablet (50 mg total) by mouth daily.  . Multiple  Vitamin (MULTIVITAMIN WITH MINERALS) TABS tablet Take 1 tablet by mouth daily.  . sertraline (ZOLOFT) 50 MG tablet TAKE 1 TABLET BY MOUTH EVERY DAY (Patient taking differently: daily.)  . vitamin B-12 (CYANOCOBALAMIN) 100 MCG tablet Take 100 mcg by mouth daily.  . ciprofloxacin (CIPRO) 500 MG tablet Take 1 tablet (500 mg total) by mouth 2 (two) times daily. (Patient not taking: Reported on 05/08/2021)  . nitroGLYCERIN (NITROSTAT) 0.4 MG SL tablet PLACE 1 TABLET UNDER THE TONUE EVERY 5 MINUTES AS NEEDED FOR CHEST PAIN. (Patient not taking: Reported on 05/08/2021)  . polyethylene glycol (MIRALAX / GLYCOLAX) packet Take 17 g by mouth daily as needed (for constipation).  (Patient not taking: Reported on 05/08/2021)  . [DISCONTINUED] ibuprofen (ADVIL) 200 MG tablet Take 200 mg by mouth every 6 (six) hours as needed. (Patient not taking: No sig reported)  . [DISCONTINUED] traMADol (ULTRAM) 50 MG tablet Take 1-2 tablets (50-100 mg total) by mouth every 6 (six) hours as needed for moderate pain. (Patient not taking: Reported on 05/08/2021)   No facility-administered encounter medications on file as of 05/08/2021.    Allergies (verified) Bee venom, Amoxicillin, and Hydrocodone   History: Past Medical History:  Diagnosis Date  . AAA (abdominal aortic aneurysm) ([place])   . AAA (abdominal aortic aneurysm) ([facility])    4-4 cm intrarenal aaa per US aorta 03-28-2019 epic  . Benign positional vertigo   .  Bladder cancer ([place])   . Bladder neck contracture   . Coronary artery disease CARDIOLOGIST--  DR Acie Fredrickson  . ED (erectile dysfunction)   . GERD (gastroesophageal reflux disease)   . H/O hiatal hernia   . History of anal fissures   . History of colon polyps   . History of radiation therapy 08/24/12-10/22/12   prostate 6600cGy/33 sessions--  for biochemical recurrence  . Hyperlipidemia   . Hypertension   . OA (osteoarthritis)   . Peripheral vascular disease ([place])   . Recurrent prostate adenocarcinoma ([place]) first dx  09/2005--  radial prostatectomy-- gleason 4+3=7, pT2c, negative margin   04/2012--  BIOCHEMICAL  RECURRENCE to 0.33 /  SALVAGE IMRT SEPT to  NOV 2013  . S/P CABG x 2    09/  2014  . S/P dilatation of esophageal stricture    1992  . Sciatica    both hips at times mostly left hip  . Sigmoid diverticulosis   . SUI (stress urinary incontinence), male   . Urethral stricture   . Wears glasses    Past Surgical History:  Procedure Laterality Date  . CARDIAC CATHETERIZATION  08-27-2013  DR Martinique   CRITICAL OSTIAL LM STENOSIS/  diffuse disease LAD 40-50%  &   pLCx 30-40%/  . CARDIOVASCULAR STRESS TEST  08-18-2013  DR NASHER   SMALL/ MEDIUM AREA MILD SCAR INFEROSEPTAL WALL/ NO ISCHEMIA/  LOW RISK SCAN/  EF 62%/  LV WALL MOTION WITH SLIGHT DECREASED OF THE SEPTUM  . CORONARY ANGIOPLASTY  1991   PTCA  of LEFT CFX  . CORONARY ARTERY BYPASS GRAFT N/A 08/27/2013   Procedure: CORONARY ARTERY BYPASS GRAFTING (CABG) times two using left internal mammary and right saphenous vein using endoscope.;  Surgeon: Melrose Nakayama, MD;  Location: [place];  Service: Open Heart Surgery;  Laterality: N/A;  . CYSTOSCOPY WITH URETHRAL DILATATION N/A 01/26/2014   Procedure: CYSTOSCOPY WITH URETHRAL DILATATION, BLADDER BIOPSY, FOLEY CATHETER;  Surgeon: Molli Hazard, MD;  Location: [facility];  Service: Urology;  Laterality: N/A;  . INGUINAL HERNIA REPAIR Bilateral 1994  . KNEE ARTHROSCOPY Right yrs ago  . LEFT HEART CATHETERIZATION WITH CORONARY ANGIOGRAM N/A 08/27/2013   Procedure: LEFT HEART CATHETERIZATION WITH CORONARY ANGIOGRAM;  Surgeon: Peter M Martinique, MD;  Location: [facility] CATH LAB;  Service: Cardiovascular;  Laterality: N/A;  . LUMBAR DISC SURGERY  1980'S  . RADICAL RETROPUBIC PROSTATECTOMY W/ BILATERAL PELVIC NODE DISSECTION  11-18-2005  . TRANSURETHRAL INCISION OF PROSTATE N/A 11/01/2020   Procedure: TRANSURETHRAL INCISION OF THE BLADDER NECK;  Surgeon: Ardis Hughs, MD;  Location: [facility];  Service: Urology;  Laterality: N/A;  . URETHROTOMY N/A 05/30/2014   Procedure: CYSTOSCOPY  BALLOON DILATION AND DIRECT VISION INTERNAL URETHROTOMY;  Surgeon: Sharyn Creamer, MD;  Location: [facility];  Service: Urology;  Laterality: N/A;   Family History  Problem Relation Age of Onset  . Cancer Father        prostate died 42 something  . Cancer Brother        prostate cancer  . Arthritis Other   . Hyperlipidemia Other   . Hypertension Other    Social History   Socioeconomic History  . Marital status: Single    Spouse name: Not on file  . Number of children: 2  . Years of education: 86  . Highest education level: Not on file  Occupational History  . Occupation: Retired    Fish farm manager: RETIRED  Tobacco Use  .  Smoking status: Former Smoker    Packs/day: 2.00    Years: 10.00    Pack years: 20.00    Types: Cigarettes    Quit date: 12/03/1994    Years since quitting: 26.4  . Smokeless tobacco: Never Used  Vaping Use  . Vaping Use: Never used  Substance and Sexual Activity  . Alcohol use: Yes    Comment: occasionally  . Drug use: No  . Sexual activity: Not on file  Other Topics Concern  . Not on file  Social History Narrative   Lives alone   Caffeine use: Coffee- 3 cups daily   Soda sometimes.    Social Determinants of Health   Financial Resource Strain: Low Risk   . Difficulty of Paying Living Expenses: Not hard at all  Food Insecurity: No Food Insecurity  . Worried About Charity fundraiser in the Last Year: Never true  . Ran Out of Food in the Last Year: Never true  Transportation Needs: No Transportation Needs  . Lack of Transportation (Medical): No  . Lack of Transportation (Non-Medical): No  Physical Activity: Insufficiently Active  . Days of Exercise per Week: 3 days  . Minutes of Exercise per Session: 30 min  Stress: Stress Concern Present  . Feeling of Stress : To some extent  Social Connections: Socially Isolated  . Frequency of  Communication with Friends and Family: Twice a week  . Frequency of Social Gatherings with Friends and Family: Never  . Attends Religious Services: Never  . Active Member of Clubs or Organizations: No  . Attends Archivist Meetings: Never  . Marital Status: Widowed    Tobacco Counseling Counseling given: Not Answered   Clinical Intake:  Pre-visit preparation completed: Yes  Pain : No/denies pain     BMI - recorded: 20.63 Nutritional Status: BMI of 19-24  Normal Nutritional Risks: None Diabetes: No  How often do you need to have someone help you when you read instructions, pamphlets, or other written materials from your doctor or pharmacy?: 1 - Never  Diabetic?No  Interpreter Needed?: No  Information entered by :: Charlott Rakes, LPN   Activities of Daily Living In your present state of health, do you have any difficulty performing the following activities: 05/08/2021 11/01/2020  Hearing? Y Y  Comment mild loss -  Vision? N N  Difficulty concentrating or making decisions? Y Y  Comment at times -  Walking or climbing stairs? Y N  Comment legs weak at times -  Dressing or bathing? N N  Doing errands, shopping? N -  Preparing Food and eating ? N -  Using the Toilet? N -  In the past six months, have you accidently leaked urine? Y -  Comment wears a brief -  Do you have problems with loss of bowel control? N -  Managing your Medications? N -  Managing your Finances? N -  Housekeeping or managing your Housekeeping? N -  Some recent data might be hidden    Patient Care Team: [name], MD as PCP - General Nahser, Wonda Cheng, MD as PCP - Cardiology (Cardiology) Sharyne Peach, MD as Consulting Physician (Ophthalmology)  Indicate any recent Medical Services you may have received from other than [facility] providers in the past year (date may be approximate).     Assessment:   This is a routine wellness examination for [name].  Hearing/Vision screen   Hearing Screening   125Hz  250Hz  500Hz  1000Hz  2000Hz  3000Hz  4000Hz  6000Hz  8000Hz   Right ear:           Left ear:           Comments: Pt stated mild loss   Vision Screening Comments: Pt follows up with [facility] for annual eye exams   Dietary issues and exercise activities discussed: Current Exercise Habits: Home exercise routine, Type of exercise: walking, Time (Minutes): 30, Frequency (Times/Week): 3, Weekly Exercise (Minutes/Week): 90  Goals Addressed            This Visit's Progress   . Patient Stated       None at this time      Depression Screen PHQ 2/9 Scores 05/08/2021 09/28/2019 09/28/2019 06/24/2018 07/18/2017 09/07/2015 08/04/2014  PHQ - 2 Score 1 1 1 3 1 2  0  PHQ- 9 Score - - - 5 - 3 -    Fall Risk Fall Risk  05/08/2021 09/28/2019 09/28/2019 06/24/2018 11/12/2017  Falls in the past year? 0 0 0 Yes Yes  Comment - - - fell in teh shower in december/ jumped on bench -  Number falls in past yr: 0 0 - 1 1  Injury with Fall? 0 - - Yes Yes  Risk for fall due to : Impaired balance/gait;Impaired vision - - - -  Follow up Falls prevention discussed - - Education provided -    FALL RISK PREVENTION PERTAINING TO THE HOME:  Any stairs in or around the home? Yes  If so, are there any without handrails? No  Home free of loose throw rugs in walkways, pet beds, electrical cords, etc? Yes  Adequate lighting in your home to reduce risk of falls? Yes   ASSISTIVE DEVICES UTILIZED TO PREVENT FALLS:  Life alert? No  Use of a cane, walker or w/c? No  Grab bars in the bathroom? No  Shower chair or bench in shower? Yes  Elevated toilet seat or a handicapped toilet? No   TIMED UP AND GO:  Was the test performed? Yes .  Length of time to ambulate 10 feet: 10 sec.   Gait slow and steady without use of assistive device  Cognitive Function: MMSE - Mini Mental State Exam 06/24/2018  Not completed: (No Data)     6CIT Screen 05/08/2021  What Year? 0 points  What month? 0 points  What  time? 0 points  Count back from 20 0 points  Months in reverse 4 points  Repeat phrase 6 points  Total Score 10    Immunizations Immunization History  Administered Date(s) Administered  . Fluad Quad(high Dose 65+) 08/03/2020  . Influenza Split 08/25/2012  . Influenza Whole 08/16/2010  . Influenza, High Dose Seasonal PF 08/12/2016, 08/20/2017  . Influenza,inj,Quad PF,6+ Mos 08/24/2013, 08/04/2014, 08/10/2015  . Influenza,inj,quad, With Preservative 11/01/2017  . Influenza-Unspecified 08/11/2018  . PFIZER Comirnaty(Gray Top)Covid-19 Tri-Sucrose Vaccine 03/06/2021  . PFIZER(Purple Top)SARS-COV-2 Vaccination 01/06/2020, 01/31/2020, 09/05/2020  . Pneumococcal Conjugate-13 08/04/2014  . Pneumococcal Polysaccharide-23 12/05/2004  . Td 11/08/2010, 05/21/2019  . Zoster Recombinat (Shingrix) 10/25/2017, 01/29/2018    TDAP status: Up to date  Flu Vaccine status: Up to date  Pneumococcal vaccine status: Due, Education has been provided regarding the importance of this vaccine. Advised may receive this vaccine at local pharmacy or Health Dept. Aware to provide a copy of the vaccination record if obtained from local pharmacy or Health Dept. Verbalized acceptance and understanding.  Covid-19 vaccine status: Completed vaccines  Qualifies for Shingles Vaccine? Yes   Zostavax completed Yes   Shingrix Completed?: Yes  Screening  Tests Health Maintenance  Topic Date Due  . Pneumococcal Vaccine 71-76 Years old (1 of 4 - PCV13) Never done  . INFLUENZA VACCINE  07/02/2021  . TETANUS/TDAP  05/20/2029  . COVID-19 Vaccine  Completed  . PNA vac Low Risk Adult  Completed  . Zoster Vaccines- Shingrix  Completed  . HPV VACCINES  Aged Out    Health Maintenance  Health Maintenance Due  Topic Date Due  . Pneumococcal Vaccine 49-39 Years old (1 of 4 - PCV13) Never done    Colorectal cancer screening: No longer required.    Additional Screening:   Vision Screening: Recommended annual  ophthalmology exams for early detection of glaucoma and other disorders of the eye. Is the patient up to date with their annual eye exam?  Yes  Who is the provider or what is the name of the office in which the patient attends annual eye exams? Dr Delman Cheadle  If pt is not established with a provider, would they like to be referred to a provider to establish care? No .   Dental Screening: Recommended annual dental exams for proper oral hygiene  Community Resource Referral / Chronic Care Management: CRR required this visit?  No   CCM required this visit?  No      Plan:     I have personally reviewed and noted the following in the patient's chart:   . Medical and social history . Use of alcohol, tobacco or illicit drugs  . Current medications and supplements including opioid prescriptions. Patient is not currently taking opioid prescriptions. . Functional ability and status . Nutritional status . Physical activity . Advanced directives . List of other physicians . Hospitalizations, surgeries, and ER visits in previous 12 months . Vitals . Screenings to include cognitive, depression, and falls . Referrals and appointments  In addition, I have reviewed and discussed with patient certain preventive protocols, quality metrics, and best practice recommendations. A written personalized care plan for preventive services as well as general preventive health recommendations were provided to patient.     Willette Brace, LPN   02/07/4664   Nurse Notes: None

## 2021-05-08 NOTE — Patient Instructions (Addendum)
[name] , Thank you for taking time to come for your Medicare Wellness Visit. I appreciate your ongoing commitment to your health goals. Please review the following plan we discussed and let me know if I can assist you in the future.   Screening recommendations/referrals: Colonoscopy: No longer required Recommended yearly ophthalmology/optometry visit for glaucoma screening and checkup Recommended yearly dental visit for hygiene and checkup  Vaccinations: Influenza vaccine: Up to date Pneumococcal vaccine: Due Tdap vaccine: Up to date Shingles vaccine: Completed 10/25/17 & 01/29/18   Covid-19: Completed 2/4, 3/1, 09/05/20 & 4//22  Advanced directives: Please bring a copy of your health care power of attorney and living will to the office at your convenience.  Conditions/risks identified: None at this time  Next appointment: Follow up in one year for your annual wellness visit.   Preventive Care 33 Years and Older, Male Preventive care refers to lifestyle choices and visits with your health care provider that can promote health and wellness. What does preventive care include?  A yearly physical exam. This is also called an annual well check.  Dental exams once or twice a year.  Routine eye exams. Ask your health care provider how often you should have your eyes checked.  Personal lifestyle choices, including:  Daily care of your teeth and gums.  Regular physical activity.  Eating a healthy diet.  Avoiding tobacco and drug use.  Limiting alcohol use.  Practicing safe sex.  Taking low doses of aspirin every day.  Taking vitamin and mineral supplements as recommended by your health care provider. What happens during an annual well check? The services and screenings done by your health care provider during your annual well check will depend on your age, overall health, lifestyle risk factors, and family history of disease. Counseling  Your health care provider may ask  you questions about your:  Alcohol use.  Tobacco use.  Drug use.  Emotional well-being.  Home and relationship well-being.  Sexual activity.  Eating habits.  History of falls.  Memory and ability to understand (cognition).  Work and work Statistician. Screening  You may have the following tests or measurements:  Height, weight, and BMI.  Blood pressure.  Lipid and cholesterol levels. These may be checked every 5 years, or more frequently if you are over 98 years old.  Skin check.  Lung cancer screening. You may have this screening every year starting at age 13 if you have a 30-pack-year history of smoking and currently smoke or have quit within the past 15 years.  Fecal occult blood test (FOBT) of the stool. You may have this test every year starting at age 73.  Flexible sigmoidoscopy or colonoscopy. You may have a sigmoidoscopy every 5 years or a colonoscopy every 10 years starting at age 55.  Prostate cancer screening. Recommendations will vary depending on your family history and other risks.  Hepatitis C blood test.  Hepatitis B blood test.  Sexually transmitted disease (STD) testing.  Diabetes screening. This is done by checking your blood sugar (glucose) after you have not eaten for a while (fasting). You may have this done every 1-3 years.  Abdominal aortic aneurysm (AAA) screening. You may need this if you are a current or former smoker.  Osteoporosis. You may be screened starting at age 34 if you are at high risk. Talk with your health care provider about your test results, treatment options, and if necessary, the need for more tests. Vaccines  Your health care provider may recommend  certain vaccines, such as:  Influenza vaccine. This is recommended every year.  Tetanus, diphtheria, and acellular pertussis (Tdap, Td) vaccine. You may need a Td booster every 10 years.  Zoster vaccine. You may need this after age 52.  Pneumococcal 13-valent conjugate  (PCV13) vaccine. One dose is recommended after age 56.  Pneumococcal polysaccharide (PPSV23) vaccine. One dose is recommended after age 34. Talk to your health care provider about which screenings and vaccines you need and how often you need them. This information is not intended to replace advice given to you by your health care provider. Make sure you discuss any questions you have with your health care provider. Document Released: 12/15/2015 Document Revised: 08/07/2016 Document Reviewed: 09/19/2015 Elsevier Interactive Patient Education  2017 [employer] Prevention in the Home Falls can cause injuries. They can happen to people of all ages. There are many things you can do to make your home safe and to help prevent falls. What can I do on the outside of my home?  Regularly fix the edges of walkways and driveways and fix any cracks.  Remove anything that might make you trip as you walk through a door, such as a raised step or threshold.  Trim any bushes or trees on the path to your home.  Use bright outdoor lighting.  Clear any walking paths of anything that might make someone trip, such as rocks or tools.  Regularly check to see if handrails are loose or broken. Make sure that both sides of any steps have handrails.  Any raised decks and porches should have guardrails on the edges.  Have any leaves, snow, or ice cleared regularly.  Use sand or salt on walking paths during winter.  Clean up any spills in your garage right away. This includes oil or grease spills. What can I do in the bathroom?  Use night lights.  Install grab bars by the toilet and in the tub and shower. Do not use towel bars as grab bars.  Use non-skid mats or decals in the tub or shower.  If you need to sit down in the shower, use a plastic, non-slip stool.  Keep the floor dry. Clean up any water that spills on the floor as soon as it happens.  Remove soap buildup in the tub or shower  regularly.  Attach bath mats securely with double-sided non-slip rug tape.  Do not have throw rugs and other things on the floor that can make you trip. What can I do in the bedroom?  Use night lights.  Make sure that you have a light by your bed that is easy to reach.  Do not use any sheets or blankets that are too big for your bed. They should not hang down onto the floor.  Have a firm chair that has side arms. You can use this for support while you get dressed.  Do not have throw rugs and other things on the floor that can make you trip. What can I do in the kitchen?  Clean up any spills right away.  Avoid walking on wet floors.  Keep items that you use a lot in easy-to-reach places.  If you need to reach something above you, use a strong step stool that has a grab bar.  Keep electrical cords out of the way.  Do not use floor polish or wax that makes floors slippery. If you must use wax, use non-skid floor wax.  Do not have throw rugs and other  things on the floor that can make you trip. What can I do with my stairs?  Do not leave any items on the stairs.  Make sure that there are handrails on both sides of the stairs and use them. Fix handrails that are broken or loose. Make sure that handrails are as long as the stairways.  Check any carpeting to make sure that it is firmly attached to the stairs. Fix any carpet that is loose or worn.  Avoid having throw rugs at the top or bottom of the stairs. If you do have throw rugs, attach them to the floor with carpet tape.  Make sure that you have a light switch at the top of the stairs and the bottom of the stairs. If you do not have them, ask someone to add them for you. What else can I do to help prevent falls?  Wear shoes that:  Do not have high heels.  Have rubber bottoms.  Are comfortable and fit you well.  Are closed at the toe. Do not wear sandals.  If you use a stepladder:  Make sure that it is fully  opened. Do not climb a closed stepladder.  Make sure that both sides of the stepladder are locked into place.  Ask someone to hold it for you, if possible.  Clearly mark and make sure that you can see:  Any grab bars or handrails.  First and last steps.  Where the edge of each step is.  Use tools that help you move around (mobility aids) if they are needed. These include:  Canes.  Walkers.  Scooters.  Crutches.  Turn on the lights when you go into a dark area. Replace any light bulbs as soon as they burn out.  Set up your furniture so you have a clear path. Avoid moving your furniture around.  If any of your floors are uneven, fix them.  If there are any pets around you, be aware of where they are.  Review your medicines with your doctor. Some medicines can make you feel dizzy. This can increase your chance of falling. Ask your doctor what other things that you can do to help prevent falls. This information is not intended to replace advice given to you by your health care provider. Make sure you discuss any questions you have with your health care provider. Document Released: 09/14/2009 Document Revised: 04/25/2016 Document Reviewed: 12/23/2014 Elsevier Interactive Patient Education  2017 [employer].

## 2021-05-10 DIAGNOSIS — H18513 Endothelial corneal dystrophy, bilateral: Secondary | ICD-10-CM | POA: Diagnosis not present

## 2021-05-10 DIAGNOSIS — H401131 Primary open-angle glaucoma, bilateral, mild stage: Secondary | ICD-10-CM | POA: Diagnosis not present

## 2021-05-28 ENCOUNTER — Other Ambulatory Visit: Payer: Self-pay

## 2021-05-29 ENCOUNTER — Ambulatory Visit: Payer: Medicare HMO | Admitting: Family Medicine

## 2021-05-30 ENCOUNTER — Ambulatory Visit (INDEPENDENT_AMBULATORY_CARE_PROVIDER_SITE_OTHER): Payer: Medicare HMO | Admitting: Family Medicine

## 2021-05-30 ENCOUNTER — Other Ambulatory Visit: Payer: Self-pay

## 2021-05-30 VITALS — BP 140/60 | HR 55 | Temp 98.0°F | Wt 139.4 lb

## 2021-05-30 DIAGNOSIS — K219 Gastro-esophageal reflux disease without esophagitis: Secondary | ICD-10-CM

## 2021-05-30 DIAGNOSIS — M545 Low back pain, unspecified: Secondary | ICD-10-CM

## 2021-05-30 MED ORDER — PREDNISONE 20 MG PO TABS: ORAL_TABLET | ORAL | 0 refills | Status: DC

## 2021-05-30 MED ORDER — GABAPENTIN 100 MG PO CAPS: 100.0000 mg | ORAL_CAPSULE | Freq: Every day | ORAL | 1 refills | Status: DC

## 2021-05-30 NOTE — Progress Notes (Signed)
Established Patient Office Visit  Subjective:  Patient ID: [name], male    DOB: [birth year]  Age: 85 y.o. MRN: [id number]  CC:  Chief Complaint  Patient presents with   Pain    Sciatic pain, x 2 weeks, L side     HPI [name] presents for approximately 2-week history of left lower back pain.  He initially complained of "sciatic pain ".  However, this is not fitting a classic sciatica type pattern.  He does not have any posterior lateral pain.  He had occasional fleeting pain left medial groin.  No swelling of the lower extremity.  Symptoms are worse at night.  Pain is predominantly left lower back region in the lumbar area.  No sacroiliac pain.  Interfering with sleep.  He tried Advil without much relief.  Achy pain sometimes severe.  No numbness or weakness lower extremity.  He has had back surgery remotely in the past.  Denies any recent fall or injury.  Symptoms interestingly seem to be improved when he is sitting such as driving or riding lawn more but worse when he is stretched out in bed.  He states he has history of hiatal hernia.  Had some recent reflux symptoms.  He apparently been on PPI in the past and had better control symptoms then.  He has Pepcid on his med list but is not taking anything currently.  Symptoms are sporadic.  No dysphagia  Past Medical History:  Diagnosis Date   AAA (abdominal aortic aneurysm) ([place])    AAA (abdominal aortic aneurysm) ([facility])    4-4 cm intrarenal aaa per US aorta 03-28-2019 epic   Benign positional vertigo    Bladder cancer ([facility])    Bladder neck contracture    Coronary artery disease CARDIOLOGIST--  DR Acie Fredrickson   ED (erectile dysfunction)    GERD (gastroesophageal reflux disease)    H/O hiatal hernia    History of anal fissures    History of colon polyps    History of radiation therapy 08/24/12-10/22/12   prostate [id number]/33 sessions--  for biochemical recurrence   Hyperlipidemia    Hypertension    OA (osteoarthritis)     Peripheral vascular disease ([place])    Recurrent prostate adenocarcinoma ([place]) first dx 09/2005--  radial prostatectomy-- gleason 4+3=7, pT2c, negative margin   04/2012--  BIOCHEMICAL  RECURRENCE to 0.33 /  SAL[facility] IMRT SEPT to  NOV 2013   S/P CABG x 2    09/  2014   S/P dilatation of esophageal stricture    1992   Sciatica    both hips at times mostly left hip   Sigmoid diverticulosis    SUI (stress urinary incontinence), male    Urethral stricture    Wears glasses     Past Surgical History:  Procedure Laterality Date   CARDIAC CATHETERIZATION  08-27-2013  DR Martinique   CRITICAL OSTIAL LM STENOSIS/  diffuse disease LAD 40-50%  &   pLCx 30-40%/   CARDIOVASCULAR STRESS TEST  08-18-2013  DR NASHER   SMALL/ MEDIUM AREA MILD SCAR INFEROSEPTAL WALL/ NO ISCHEMIA/  LOW RISK SCAN/  EF 62%/  LV WALL MOTION WITH SLIGHT DECREASED OF THE SEPTUM   CORONARY ANGIOPLASTY  1991   PTCA  of LEFT CFX   CORONARY ARTERY BYPASS GRAFT N/A 08/27/2013   Procedure: CORONARY ARTERY BYPASS GRAFTING (CABG) times two using left internal mammary and right saphenous vein using endoscope.;  Surgeon: Melrose Nakayama, MD;  Location: [facility];  Service: Open Heart Surgery;  Laterality: N/A;   CYSTOSCOPY WITH URETHRAL DILATATION N/A 01/26/2014   Procedure: CYSTOSCOPY WITH URETHRAL DILATATION, BLADDER BIOPSY, FOLEY CATHETER;  Surgeon: Molli Hazard, MD;  Location: [facility];  Service: Urology;  Laterality: N/A;   INGUINAL HERNIA REPAIR Bilateral 1994   KNEE ARTHROSCOPY Right yrs ago   LEFT HEART CATHETERIZATION WITH CORONARY ANGIOGRAM N/A 08/27/2013   Procedure: LEFT HEART CATHETERIZATION WITH CORONARY ANGIOGRAM;  Surgeon: Peter M Martinique, MD;  Location: [facility] CATH LAB;  Service: Cardiovascular;  Laterality: N/A;   LUMBAR DISC SURGERY  1980'S   RADICAL RETROPUBIC PROSTATECTOMY W/ BILATERAL PELVIC NODE DISSECTION  11-18-2005   TRANSURETHRAL INCISION OF PROSTATE N/A 11/01/2020   Procedure: TRANSURETHRAL INCISION OF THE BLADDER  NECK;  Surgeon: Ardis Hughs, MD;  Location: [facility];  Service: Urology;  Laterality: N/A;   URETHROTOMY N/A 05/30/2014   Procedure: CYSTOSCOPY  BALLOON DILATION AND DIRECT VISION INTERNAL URETHROTOMY;  Surgeon: Sharyn Creamer, MD;  Location: [facility];  Service: Urology;  Laterality: N/A;    Family History  Problem Relation Age of Onset   Cancer Father        prostate died 9 something   Cancer Brother        prostate cancer   Arthritis Other    Hyperlipidemia Other    Hypertension Other     Social History   Socioeconomic History   Marital status: Single    Spouse name: Not on file   Number of children: 2   Years of education: 13   Highest education level: Not on file  Occupational History   Occupation: Retired    Fish farm manager: RETIRED  Tobacco Use   Smoking status: Former    Packs/day: 2.00    Years: 10.00    Pack years: 20.00    Types: Cigarettes    Quit date: 12/03/1994    Years since quitting: 26.5   Smokeless tobacco: Never  Vaping Use   Vaping Use: Never used  Substance and Sexual Activity   Alcohol use: Yes    Comment: occasionally   Drug use: No   Sexual activity: Not on file  Other Topics Concern   Not on file  Social History Narrative   Lives alone   Caffeine use: Coffee- 3 cups daily   Soda sometimes.    Social Determinants of Health   Financial Resource Strain: Low Risk    Difficulty of Paying Living Expenses: Not hard at all  Food Insecurity: No Food Insecurity   Worried About Charity fundraiser in the Last Year: Never true   [place] in the Last Year: Never true  Transportation Needs: No Transportation Needs   Lack of Transportation (Medical): No   Lack of Transportation (Non-Medical): No  Physical Activity: Insufficiently Active   Days of Exercise per Week: 3 days   Minutes of Exercise per Session: 30 min  Stress: Stress Concern Present   Feeling of Stress : To some extent  Social  Connections: Socially Isolated   Frequency of Communication with Friends and Family: Twice a week   Frequency of Social Gatherings with Friends and Family: Never   Attends Religious Services: Never   Marine scientist or Organizations: No   Attends Archivist Meetings: Never   Marital Status: Widowed  Intimate Partner Violence: Not At Risk   Fear of Current or Ex-Partner: No   Emotionally Abused: No   Physically Abused: No  Sexually Abused: No    Outpatient Medications Prior to Visit  Medication Sig Dispense Refill   aspirin 81 MG EC tablet Take 1 tablet (81 mg total) by mouth daily.     atorvastatin (LIPITOR) 40 MG tablet Take one tablet by mouth once daily. 90 tablet 3   carvedilol (COREG) 12.5 MG tablet TAKE 1 TABLET BY MOUTH TWICE A DAY 180 tablet 2   famotidine (PEPCID) 20 MG tablet Take 20 mg by mouth as needed for heartburn or indigestion.     furosemide (LASIX) 20 MG tablet Take 1 tablet (20 mg total) by mouth 3 (three) times a week. On Monday, Wednesday, Friday 45 tablet 3   latanoprost (XALATAN) 0.005 % ophthalmic solution Place 1 drop into both eyes at bedtime.      losartan (COZAAR) 50 MG tablet Take 1 tablet (50 mg total) by mouth daily. 90 tablet 3   Multiple Vitamin (MULTIVITAMIN WITH MINERALS) TABS tablet Take 1 tablet by mouth daily.     nitroGLYCERIN (NITROSTAT) 0.4 MG SL tablet PLACE 1 TABLET UNDER THE TONUE EVERY 5 MINUTES AS NEEDED FOR CHEST PAIN. 75 tablet 3   polyethylene glycol (MIRALAX / GLYCOLAX) packet Take 17 g by mouth daily as needed (for constipation).     sertraline (ZOLOFT) 50 MG tablet TAKE 1 TABLET BY MOUTH EVERY DAY (Patient taking differently: daily.) 90 tablet 0   vitamin B-12 (CYANOCOBALAMIN) 100 MCG tablet Take 100 mcg by mouth daily.     ciprofloxacin (CIPRO) 500 MG tablet Take 1 tablet (500 mg total) by mouth 2 (two) times daily. 20 tablet 0   No facility-administered medications prior to visit.    Allergies  Allergen  Reactions   Bee Venom Other (See Comments)    Reaction swelling   Amoxicillin Rash   Hydrocodone Other (See Comments)    REACTION: disorientation    ROS Review of Systems  Constitutional:  Negative for activity change, appetite change and fever.  Respiratory:  Negative for cough and shortness of breath.   Cardiovascular:  Negative for chest pain and leg swelling.  Gastrointestinal:  Negative for abdominal pain and vomiting.  Genitourinary:  Negative for dysuria, flank pain and hematuria.  Musculoskeletal:  Positive for back pain. Negative for joint swelling.  Neurological:  Negative for weakness and numbness.     Objective:    Physical Exam Constitutional:      General: He is not in acute distress.    Appearance: He is well-developed.  HENT:     Right Ear: External ear normal.     Left Ear: External ear normal.  Eyes:     Pupils: Pupils are equal, round, and reactive to light.  Neck:     Thyroid: No thyromegaly.  Cardiovascular:     Rate and Rhythm: Normal rate and regular rhythm.     Heart sounds: Normal heart sounds. No murmur heard. Pulmonary:     Effort: Pulmonary effort is normal. No respiratory distress.     Breath sounds: Normal breath sounds. No wheezing or rales.  Musculoskeletal:     Cervical back: Neck supple.  Skin:    Findings: No rash.  Neurological:     Mental Status: He is alert and oriented to person, place, and time.     Cranial Nerves: No cranial nerve deficit.     Deep Tendon Reflexes: Reflexes are normal and symmetric.     Comments: Good strength with plantarflexion, dorsiflexion, and knee extension bilaterally.  Normal sensory function throughout.  He  has trace reflexes knee and ankle bilaterally.    BP 140/60 (BP Location: Left Arm, Patient Position: Sitting, Cuff Size: Normal)   Pulse (!) 55   Temp 98 F (36.7 C) (Oral)   Wt 139 lb 6.4 oz (63.2 kg)   SpO2 99%   BMI 21.20 kg/m  Wt Readings from Last 3 Encounters:  05/30/21 139 lb 6.4 oz  (63.2 kg)  05/08/21 135 lb 11.2 oz (61.6 kg)  04/04/21 136 lb 11.2 oz (62 kg)     There are no preventive care reminders to display for this patient.  There are no preventive care reminders to display for this patient.  Lab Results  Component Value Date   TSH 1.45 03/28/2021   Lab Results  Component Value Date   WBC 5.5 03/15/2021   HGB 11.9 (L) 03/15/2021   HCT 36.3 (L) 03/15/2021   MCV 109.3 (H) 03/15/2021   PLT 139 (L) 03/15/2021   Lab Results  Component Value Date   NA 137 03/15/2021   K 4.1 03/15/2021   CO2 26 03/15/2021   GLUCOSE 102 (H) 03/15/2021   BUN 22 03/15/2021   CREATININE 1.71 (H) 03/15/2021   BILITOT 0.6 03/15/2021   ALKPHOS 53 03/15/2021   AST 27 03/15/2021   ALT 27 03/15/2021   PROT 6.2 (L) 03/15/2021   ALBUMIN 3.7 03/15/2021   CALCIUM 9.2 03/15/2021   ANIONGAP 5 03/15/2021   EGFR 46 (L) 02/12/2021   GFR 47.34 (L) 02/01/2020   Lab Results  Component Value Date   CHOL 109 01/24/2021   Lab Results  Component Value Date   HDL 44 01/24/2021   Lab Results  Component Value Date   LDLCALC 48 01/24/2021   Lab Results  Component Value Date   TRIG 84 01/24/2021   Lab Results  Component Value Date   CHOLHDL 2.5 01/24/2021   No results found for: HGBA1C    Assessment & Plan:   #1 approximate 2-week history of left-sided low back pain.  Does not present with classic sciatica type symptoms.  No sacroiliac tenderness.  Does describe some pain radiating into the left buttock and may have some neuropathic type pain but neuro exam is nonfocal  -Avoid regular use of nonsteroidals -Discussed trial of low-dose gabapentin 100 mg nightly -Prednisone 20 mg 2 tablets daily for 1 week -Reassess in 1 week and sooner as needed  #2 history of intermittent GERD symptoms. -Dietary and lifestyle factors discussed -Recommend getting on regular Pepcid 20 mg twice daily.  If not relieved with that consider PPI  30 minutes spent with patient assessing  problems above and discussing medications and possible further evaluation.  We also reviewed extension stretches for his back  Meds ordered this encounter  Medications   predniSONE (DELTASONE) 20 MG tablet    Sig: Take two tablets by mouth daily for one week.    Dispense:  14 tablet    Refill:  0   gabapentin (NEURONTIN) 100 MG capsule    Sig: Take 1 capsule (100 mg total) by mouth at bedtime.    Dispense:  30 capsule    Refill:  1    Follow-up: Return in about 1 week (around 06/06/2021).    [name], MD

## 2021-05-30 NOTE — Patient Instructions (Signed)
Set up one week follow up  For reflux symptoms, try OTC Pepcid 20 mg twice daily as needed.

## 2021-06-06 ENCOUNTER — Ambulatory Visit (INDEPENDENT_AMBULATORY_CARE_PROVIDER_SITE_OTHER): Payer: Medicare HMO | Admitting: Family Medicine

## 2021-06-06 ENCOUNTER — Other Ambulatory Visit: Payer: Self-pay

## 2021-06-06 ENCOUNTER — Encounter: Payer: Self-pay | Admitting: Family Medicine

## 2021-06-06 VITALS — BP 124/56 | HR 58 | Temp 97.9°F | Wt 141.4 lb

## 2021-06-06 DIAGNOSIS — M5416 Radiculopathy, lumbar region: Secondary | ICD-10-CM

## 2021-06-06 NOTE — Progress Notes (Signed)
Established Patient Office Visit  Subjective:  Patient ID: [name], male    DOB: 1936-09-11  Age: 85 y.o. MRN: [id number]  CC:  Chief Complaint  Patient presents with   Follow-up    Back pain, no improvement     HPI [name] presents for follow-up regarding lower lumbar back pain with left lower extremity symptoms.  Refer to note from visit 1 week ago for details:  [name] presents for approximately 2-week history of left lower back pain.  He initially complained of "sciatic pain ".  However, this is not fitting a classic sciatica type pattern.  He does not have any posterior lateral pain.  He had occasional fleeting pain left medial groin.  No swelling of the lower extremity.  Symptoms are worse at night.  Pain is predominantly left lower back region in the lumbar area.  No sacroiliac pain.  Interfering with sleep.  He tried Advil without much relief.  Achy pain sometimes severe.  No numbness or weakness lower extremity.  He has had back surgery remotely in the past.  Denies any recent fall or injury.  Symptoms interestingly seem to be improved when he is sitting such as driving or riding lawn more but worse when he is stretched out in bed.  Does feel the night pain may be slightly better with gabapentin but daytime pain is essentially unchanged.  He feels like he may have little bit of weakness from getting from seated to standing but no difficulties with walking.  Some worsening of pain with back flexion.  Does have remote history of back surgery several years ago.       Past Medical History:  Diagnosis Date   AAA (abdominal aortic aneurysm) ([place])    AAA (abdominal aortic aneurysm) ([facility])    4-4 cm intrarenal aaa per US aorta 03-28-2019 epic   Benign positional vertigo    Bladder cancer ([facility])    Bladder neck contracture    Coronary artery disease CARDIOLOGIST--  DR Acie Fredrickson   ED (erectile dysfunction)    GERD (gastroesophageal reflux disease)    H/O hiatal  hernia    History of anal fissures    History of colon polyps    History of radiation therapy 08/24/12-10/22/12   prostate 6600cGy/33 sessions--  for biochemical recurrence   Hyperlipidemia    Hypertension    OA (osteoarthritis)    Peripheral vascular disease ([place])    Recurrent prostate adenocarcinoma ([place]) first dx 09/2005--  radial prostatectomy-- gleason 4+3=7, pT2c, negative margin   04/2012--  BIOCHEMICAL  RECURRENCE to 0.33 /  SALVAGE IMRT SEPT to  NOV 2013   S/P CABG x 2    09/  2014   S/P dilatation of esophageal stricture    1992   Sciatica    both hips at times mostly left hip   Sigmoid diverticulosis    SUI (stress urinary incontinence), male    Urethral stricture    Wears glasses     Past Surgical History:  Procedure Laterality Date   CARDIAC CATHETERIZATION  08-27-2013  DR Martinique   CRITICAL OSTIAL LM STENOSIS/  diffuse disease LAD 40-50%  &   pLCx 30-40%/   CARDIOVASCULAR STRESS TEST  08-18-2013  DR NASHER   SMALL/ MEDIUM AREA MILD SCAR INFEROSEPTAL WALL/ NO ISCHEMIA/  LOW RISK SCAN/  EF 62%/  LV WALL MOTION WITH SLIGHT DECREASED OF THE SEPTUM   CORONARY ANGIOPLASTY  1991   PTCA  of LEFT CFX  CORONARY ARTERY BYPASS GRAFT N/A 08/27/2013   Procedure: CORONARY ARTERY BYPASS GRAFTING (CABG) times two using left internal mammary and right saphenous vein using endoscope.;  Surgeon: Melrose Nakayama, MD;  Location: [facility];  Service: Open Heart Surgery;  Laterality: N/A;   CYSTOSCOPY WITH URETHRAL DILATATION N/A 01/26/2014   Procedure: CYSTOSCOPY WITH URETHRAL DILATATION, BLADDER BIOPSY, FOLEY CATHETER;  Surgeon: Molli Hazard, MD;  Location: [facility];  Service: Urology;  Laterality: N/A;   INGUINAL HERNIA REPAIR Bilateral 1994   KNEE ARTHROSCOPY Right yrs ago   LEFT HEART CATHETERIZATION WITH CORONARY ANGIOGRAM N/A 08/27/2013   Procedure: LEFT HEART CATHETERIZATION WITH CORONARY ANGIOGRAM;  Surgeon: Peter M Martinique, MD;  Location: [facility] CATH LAB;  Service:  Cardiovascular;  Laterality: N/A;   LUMBAR DISC SURGERY  1980'S   RADICAL RETROPUBIC PROSTATECTOMY W/ BILATERAL PELVIC NODE DISSECTION  11-18-2005   TRANSURETHRAL INCISION OF PROSTATE N/A 11/01/2020   Procedure: TRANSURETHRAL INCISION OF THE BLADDER NECK;  Surgeon: Ardis Hughs, MD;  Location: [facility];  Service: Urology;  Laterality: N/A;   URETHROTOMY N/A 05/30/2014   Procedure: CYSTOSCOPY  BALLOON DILATION AND DIRECT VISION INTERNAL URETHROTOMY;  Surgeon: Sharyn Creamer, MD;  Location: [facility];  Service: Urology;  Laterality: N/A;    Family History  Problem Relation Age of Onset   Cancer Father        prostate died 42 something   Cancer Brother        prostate cancer   Arthritis Other    Hyperlipidemia Other    Hypertension Other     Social History   Socioeconomic History   Marital status: Single    Spouse name: Not on file   Number of children: 2   Years of education: 13   Highest education level: Not on file  Occupational History   Occupation: Retired    Fish farm manager: RETIRED  Tobacco Use   Smoking status: Former    Packs/day: 2.00    Years: 10.00    Pack years: 20.00    Types: Cigarettes    Quit date: 12/03/1994    Years since quitting: 26.5   Smokeless tobacco: Never  Vaping Use   Vaping Use: Never used  Substance and Sexual Activity   Alcohol use: Yes    Comment: occasionally   Drug use: No   Sexual activity: Not on file  Other Topics Concern   Not on file  Social History Narrative   Lives alone   Caffeine use: Coffee- 3 cups daily   Soda sometimes.    Social Determinants of Health   Financial Resource Strain: Low Risk    Difficulty of Paying Living Expenses: Not hard at all  Food Insecurity: No Food Insecurity   Worried About Charity fundraiser in the Last Year: Never true   [place] in the Last Year: Never true  Transportation Needs: No Transportation Needs   Lack of Transportation (Medical): No    Lack of Transportation (Non-Medical): No  Physical Activity: Insufficiently Active   Days of Exercise per Week: 3 days   Minutes of Exercise per Session: 30 min  Stress: Stress Concern Present   Feeling of Stress : To some extent  Social Connections: Socially Isolated   Frequency of Communication with Friends and Family: Twice a week   Frequency of Social Gatherings with Friends and Family: Never   Attends Religious Services: Never   Marine scientist or Organizations: No  Attends Archivist Meetings: Never   Marital Status: Widowed  Human resources officer Violence: Not At Risk   Fear of Current or Ex-Partner: No   Emotionally Abused: No   Physically Abused: No   Sexually Abused: No    Outpatient Medications Prior to Visit  Medication Sig Dispense Refill   aspirin 81 MG EC tablet Take 1 tablet (81 mg total) by mouth daily.     atorvastatin (LIPITOR) 40 MG tablet Take one tablet by mouth once daily. 90 tablet 3   carvedilol (COREG) 12.5 MG tablet TAKE 1 TABLET BY MOUTH TWICE A DAY 180 tablet 2   famotidine (PEPCID) 20 MG tablet Take 20 mg by mouth as needed for heartburn or indigestion.     furosemide (LASIX) 20 MG tablet Take 1 tablet (20 mg total) by mouth 3 (three) times a week. On Monday, Wednesday, Friday 45 tablet 3   gabapentin (NEURONTIN) 100 MG capsule Take 1 capsule (100 mg total) by mouth at bedtime. 30 capsule 1   latanoprost (XALATAN) 0.005 % ophthalmic solution Place 1 drop into both eyes at bedtime.      losartan (COZAAR) 50 MG tablet Take 1 tablet (50 mg total) by mouth daily. 90 tablet 3   Multiple Vitamin (MULTIVITAMIN WITH MINERALS) TABS tablet Take 1 tablet by mouth daily.     nitroGLYCERIN (NITROSTAT) 0.4 MG SL tablet PLACE 1 TABLET UNDER THE TONUE EVERY 5 MINUTES AS NEEDED FOR CHEST PAIN. 75 tablet 3   polyethylene glycol (MIRALAX / GLYCOLAX) packet Take 17 g by mouth daily as needed (for constipation).     predniSONE (DELTASONE) 20 MG tablet Take two  tablets by mouth daily for one week. 14 tablet 0   sertraline (ZOLOFT) 50 MG tablet TAKE 1 TABLET BY MOUTH EVERY DAY (Patient taking differently: daily.) 90 tablet 0   vitamin B-12 (CYANOCOBALAMIN) 100 MCG tablet Take 100 mcg by mouth daily.     No facility-administered medications prior to visit.    Allergies  Allergen Reactions   Bee Venom Other (See Comments)    Reaction swelling   Amoxicillin Rash   Hydrocodone Other (See Comments)    REACTION: disorientation    ROS Review of Systems  Constitutional:  Negative for activity change, appetite change and fever.  Respiratory:  Negative for cough and shortness of breath.   Cardiovascular:  Negative for chest pain and leg swelling.  Gastrointestinal:  Negative for abdominal pain and vomiting.  Genitourinary:  Negative for dysuria, flank pain and hematuria.  Musculoskeletal:  Positive for back pain. Negative for joint swelling.  Neurological:  Negative for weakness and numbness.     Objective:    Physical Exam Vitals reviewed.  Constitutional:      Appearance: Normal appearance.  Cardiovascular:     Rate and Rhythm: Normal rate and regular rhythm.  Musculoskeletal:     Comments: No spinal tenderness lower lumbar area.  Straight leg raise remains negative bilaterally.  No leg edema. He does have some very mild weakness with left dorsiflexion compared to the right but good strength with plantarflexion and knee extension bilaterally.  Slightly diminished left ankle reflex compared to the right  Neurological:     Mental Status: He is alert.    BP (!) 124/56 (BP Location: Left Arm, Patient Position: Sitting, Cuff Size: Normal)   Pulse (!) 58   Temp 97.9 F (36.6 C) (Oral)   Wt 141 lb 6.4 oz (64.1 kg)   SpO2 98%   BMI 21.50  kg/m  Wt Readings from Last 3 Encounters:  06/06/21 141 lb 6.4 oz (64.1 kg)  05/30/21 139 lb 6.4 oz (63.2 kg)  05/08/21 135 lb 11.2 oz (61.6 kg)     There are no preventive care reminders to display  for this patient.  There are no preventive care reminders to display for this patient.  Lab Results  Component Value Date   TSH 1.45 03/28/2021   Lab Results  Component Value Date   WBC 5.5 03/15/2021   HGB 11.9 (L) 03/15/2021   HCT 36.3 (L) 03/15/2021   MCV 109.3 (H) 03/15/2021   PLT 139 (L) 03/15/2021   Lab Results  Component Value Date   NA 137 03/15/2021   K 4.1 03/15/2021   CO2 26 03/15/2021   GLUCOSE 102 (H) 03/15/2021   BUN 22 03/15/2021   CREATININE 1.71 (H) 03/15/2021   BILITOT 0.6 03/15/2021   ALKPHOS 53 03/15/2021   AST 27 03/15/2021   ALT 27 03/15/2021   PROT 6.2 (L) 03/15/2021   ALBUMIN 3.7 03/15/2021   CALCIUM 9.2 03/15/2021   ANIONGAP 5 03/15/2021   EGFR 46 (L) 02/12/2021   GFR 47.34 (L) 02/01/2020   Lab Results  Component Value Date   CHOL 109 01/24/2021   Lab Results  Component Value Date   HDL 44 01/24/2021   Lab Results  Component Value Date   LDLCALC 48 01/24/2021   Lab Results  Component Value Date   TRIG 84 01/24/2021   Lab Results  Component Value Date   CHOLHDL 2.5 01/24/2021   No results found for: HGBA1C    Assessment & Plan:   Ongoing lumbar back pain with some left lower extremity discomfort.  Suspect left lumbar radiculitis.  Does have very mild weakness with left dorsiflexion and slightly diminished left ankle reflex compared to right but gait without difficulty  -Increase gabapentin to 100 mg 3 times daily with caution for daytime sedation. -We did discuss consideration for trial of physical therapy but he declines -If not improving over the next few weeks may need repeat MRI to further assess and especially if he has any progressive weakness or new symptoms  No orders of the defined types were placed in this encounter.   Follow-up: No follow-ups on file.    [name], MD

## 2021-06-06 NOTE — Patient Instructions (Signed)
Increase the Gabapentin to 100 mg every 8 hours as needed for low back and left lower extremity pain.  Would consider trial of physical therapy if not continuing to improving in 2-3 weeks.

## 2021-06-19 ENCOUNTER — Ambulatory Visit (INDEPENDENT_AMBULATORY_CARE_PROVIDER_SITE_OTHER): Payer: Medicare HMO | Admitting: Family Medicine

## 2021-06-19 ENCOUNTER — Other Ambulatory Visit: Payer: Self-pay

## 2021-06-19 ENCOUNTER — Encounter: Payer: Self-pay | Admitting: Family Medicine

## 2021-06-19 VITALS — BP 120/62 | HR 69 | Temp 97.8°F | Wt 137.4 lb

## 2021-06-19 DIAGNOSIS — M5416 Radiculopathy, lumbar region: Secondary | ICD-10-CM | POA: Diagnosis not present

## 2021-06-19 DIAGNOSIS — Z8546 Personal history of malignant neoplasm of prostate: Secondary | ICD-10-CM | POA: Diagnosis not present

## 2021-06-19 NOTE — Patient Instructions (Signed)
We will set up MRI of lumbar spine to further assess.

## 2021-06-19 NOTE — Progress Notes (Signed)
Established Patient Office Visit  Subjective:  Patient ID: [name], male    DOB: [birth year]  Age: 85 y.o. MRN: [id number]  CC:  Chief Complaint  Patient presents with   Follow-up    Pt states he is here for "the same thing"    HPI [name] presents for ongoing back and left lower extremity pain.  He is somewhat of a poor historian.  He previously complained of groin pain but states his pain now is more in the hip region.  We increase his gabapentin and he took up to 300 mg at night without any real benefit.  Continues to have achy pain mostly in the back and buttock area but some down the left lower extremity.  There was question of some mild weakness with left dorsiflexion on recent exam but he seems to be ambulating fairly well.  Does not demonstrate foot drop on exam.  No urine or stool incontinence.  Remote history of back surgery years ago.  He is not sure regarding details.  Does also have remote history of prostate cancer.  No recent appetite or weight changes.  As above, no recent relief with gabapentin.  He also tried prednisone which did not seem to help  He does have history of abdominal aortic aneurysm followed by vascular surgery with most recent imaging in May.  Past Medical History:  Diagnosis Date   AAA (abdominal aortic aneurysm) ([facility])    AAA (abdominal aortic aneurysm) ([facility])    4-4 cm intrarenal aaa per US aorta 03-28-2019 epic   Benign positional vertigo    Bladder cancer ([facility])    Bladder neck contracture    Coronary artery disease CARDIOLOGIST--  DR Acie Fredrickson   ED (erectile dysfunction)    GERD (gastroesophageal reflux disease)    H/O hiatal hernia    History of anal fissures    History of colon polyps    History of radiation therapy 08/24/12-10/22/12   prostate 6600cGy/33 sessions--  for biochemical recurrence   Hyperlipidemia    Hypertension    OA (osteoarthritis)    Peripheral vascular disease ([place])    Recurrent prostate adenocarcinoma ([place])  first dx 09/2005--  radial prostatectomy-- gleason 4+3=7, pT2c, negative margin   04/2012--  BIOCHEMICAL  RECURRENCE to 0.33 /  SALVAGE IMRT SEPT to  NOV 2013   S/P CABG x 2    09/  2014   S/P dilatation of esophageal stricture    1992   Sciatica    both hips at times mostly left hip   Sigmoid diverticulosis    SUI (stress urinary incontinence), male    Urethral stricture    Wears glasses     Past Surgical History:  Procedure Laterality Date   CARDIAC CATHETERIZATION  08-27-2013  DR Martinique   CRITICAL OSTIAL LM STENOSIS/  diffuse disease LAD 40-50%  &   pLCx 30-40%/   CARDIOVASCULAR STRESS TEST  08-18-2013  DR NASHER   SMALL/ MEDIUM AREA MILD SCAR INFEROSEPTAL WALL/ NO ISCHEMIA/  LOW RISK SCAN/  EF 62%/  LV WALL MOTION WITH SLIGHT DECREASED OF THE SEPTUM   CORONARY ANGIOPLASTY  1991   PTCA  of LEFT CFX   CORONARY ARTERY BYPASS GRAFT N/A 08/27/2013   Procedure: CORONARY ARTERY BYPASS GRAFTING (CABG) times two using left internal mammary and right saphenous vein using endoscope.;  Surgeon: Melrose Nakayama, MD;  Location: [facility];  Service: Open Heart Surgery;  Laterality: N/A;   CYSTOSCOPY WITH URETHRAL DILATATION N/A 01/26/2014  Procedure: CYSTOSCOPY WITH URETHRAL DILATATION, BLADDER BIOPSY, FOLEY CATHETER;  Surgeon: Molli Hazard, MD;  Location: [facility];  Service: Urology;  Laterality: N/A;   INGUINAL HERNIA REPAIR Bilateral 1994   KNEE ARTHROSCOPY Right yrs ago   LEFT HEART CATHETERIZATION WITH CORONARY ANGIOGRAM N/A 08/27/2013   Procedure: LEFT HEART CATHETERIZATION WITH CORONARY ANGIOGRAM;  Surgeon: Peter M Martinique, MD;  Location[facility] C[facility];  Service: Cardiovascular;  Laterality: N/A;   LUMBAR DISC SURGERY  1980'S   RADICAL RETROPUBIC PROSTATECTOMY W/ BILATERAL PELVIC NODE DISSECTION  11-18-2005   TRANSURETHRAL INCISION OF PROSTATE N/A 11/01/2020   Procedure: TRANSURETHRAL INCISION OF THE BLADDER NECK;  Surgeon: Ardis Hughs, MD;  Location: [facility];  Service: Urology;  Laterality: N/A;   URETHROTOMY N/A 05/30/2014   Procedure: CYSTOSCOPY  BALLOON DILATION AND DIRECT VISION INTERNAL URETHROTOMY;  Surgeon: Sharyn Creamer, MD;  Location: [facility];  Service: Urology;  Laterality: N/A;    Family History  Problem Relation Age of Onset   Cancer Father        prostate died 95 something   Cancer Brother        prostate cancer   Arthritis Other    Hyperlipidemia Other    Hypertension Other     Social History   Socioeconomic History   Marital status: Single    Spouse name: Not on file   Number of children: 2   Years of education: 13   Highest education level: Not on file  Occupational History   Occupation: Retired    Fish farm manager: RETIRED  Tobacco Use   Smoking status: Former    Packs/day: 2.00    Years: 10.00    Pack years: 20.00    Types: Cigarettes    Quit date: 12/03/1994    Years since quitting: 26.5   Smokeless tobacco: Never  Vaping Use   Vaping Use: Never used  Substance and Sexual Activity   Alcohol use: Yes    Comment: occasionally   Drug use: No   Sexual activity: Not on file  Other Topics Concern   Not on file  Social History Narrative   Lives alone   Caffeine use: Coffee- 3 cups daily   Soda sometimes.    Social Determinants of Health   Financial Resource Strain: Low Risk    Difficulty of Paying Living Expenses: Not hard at all  Food Insecurity: No Food Insecurity   Worried About Charity fundraiser in the Last Year: Never true   [place] in the Last Year: Never true  Transportation Needs: No Transportation Needs   Lack of Transportation (Medical): No   Lack of Transportation (Non-Medical): No  Physical Activity: Insufficiently Active   Days of Exercise per Week: 3 days   Minutes of Exercise per Session: 30 min  Stress: Stress Concern Present   Feeling of Stress : To some extent  Social Connections: Socially Isolated   Frequency of Communication with Friends and  Family: Twice a week   Frequency of Social Gatherings with Friends and Family: Never   Attends Religious Services: Never   Marine scientist or Organizations: No   Attends Archivist Meetings: Never   Marital Status: Widowed  Human resources officer Violence: Not At Risk   Fear of Current or Ex-Partner: No   Emotionally Abused: No   Physically Abused: No   Sexually Abused: No    Outpatient Medications Prior to Visit  Medication Sig Dispense Refill  aspirin 81 MG EC tablet Take 1 tablet (81 mg total) by mouth daily.     atorvastatin (LIPITOR) 40 MG tablet Take one tablet by mouth once daily. 90 tablet 3   carvedilol (COREG) 12.5 MG tablet TAKE 1 TABLET BY MOUTH TWICE A DAY 180 tablet 2   famotidine (PEPCID) 20 MG tablet Take 20 mg by mouth as needed for heartburn or indigestion.     furosemide (LASIX) 20 MG tablet Take 1 tablet (20 mg total) by mouth 3 (three) times a week. On Monday, Wednesday, Friday 45 tablet 3   gabapentin (NEURONTIN) 100 MG capsule Take 1 capsule (100 mg total) by mouth at bedtime. 30 capsule 1   latanoprost (XALATAN) 0.005 % ophthalmic solution Place 1 drop into both eyes at bedtime.      losartan (COZAAR) 50 MG tablet Take 1 tablet (50 mg total) by mouth daily. 90 tablet 3   Multiple Vitamin (MULTIVITAMIN WITH MINERALS) TABS tablet Take 1 tablet by mouth daily.     nitroGLYCERIN (NITROSTAT) 0.4 MG SL tablet PLACE 1 TABLET UNDER THE TONUE EVERY 5 MINUTES AS NEEDED FOR CHEST PAIN. 75 tablet 3   polyethylene glycol (MIRALAX / GLYCOLAX) packet Take 17 g by mouth daily as needed (for constipation).     predniSONE (DELTASONE) 20 MG tablet Take two tablets by mouth daily for one week. 14 tablet 0   sertraline (ZOLOFT) 50 MG tablet TAKE 1 TABLET BY MOUTH EVERY DAY (Patient taking differently: daily.) 90 tablet 0   vitamin B-12 (CYANOCOBALAMIN) 100 MCG tablet Take 100 mcg by mouth daily.     No facility-administered medications prior to visit.    Allergies   Allergen Reactions   Bee Venom Other (See Comments)    Reaction swelling   Amoxicillin Rash   Hydrocodone Other (See Comments)    REACTION: disorientation    ROS Review of Systems  Constitutional:  Negative for activity change, appetite change and fever.  Respiratory:  Negative for cough and shortness of breath.   Cardiovascular:  Negative for chest pain and leg swelling.  Gastrointestinal:  Negative for abdominal pain and vomiting.  Genitourinary:  Negative for dysuria, flank pain and hematuria.  Musculoskeletal:  Positive for back pain. Negative for joint swelling.  Neurological:  Positive for weakness. Negative for numbness.     Objective:    Physical Exam Constitutional:      General: He is not in acute distress.    Appearance: He is well-developed.  Neck:     Thyroid: No thyromegaly.  Cardiovascular:     Rate and Rhythm: Normal rate and regular rhythm.     Heart sounds: Normal heart sounds. No murmur heard. Pulmonary:     Effort: Pulmonary effort is normal. No respiratory distress.     Breath sounds: Normal breath sounds. No wheezing or rales.  Skin:    Findings: No rash.  Neurological:     General: No focal deficit present.     Mental Status: He is alert and oriented to person, place, and time.     Cranial Nerves: No cranial nerve deficit.     Deep Tendon Reflexes: Reflexes are normal and symmetric.     Comments: Does have some weakness with left dorsiflexion compared to the right and slightly diminished left ankle reflex compared to the right  Psychiatric:        Mood and Affect: Mood normal.    BP 120/62 (BP Location: Left Arm, Patient Position: Sitting, Cuff Size: Normal)  Pulse 69   Temp 97.8 F (36.6 C) (Oral)   Wt 137 lb 6.4 oz (62.3 kg)   SpO2 98%   BMI 20.89 kg/m  Wt Readings from Last 3 Encounters:  06/19/21 137 lb 6.4 oz (62.3 kg)  06/06/21 141 lb 6.4 oz (64.1 kg)  05/30/21 139 lb 6.4 oz (63.2 kg)     There are no preventive care  reminders to display for this patient.  There are no preventive care reminders to display for this patient.  Lab Results  Component Value Date   TSH 1.45 03/28/2021   Lab Results  Component Value Date   WBC 5.5 03/15/2021   HGB 11.9 (L) 03/15/2021   HCT 36.3 (L) 03/15/2021   MCV 109.3 (H) 03/15/2021   PLT 139 (L) 03/15/2021   Lab Results  Component Value Date   NA 137 03/15/2021   K 4.1 03/15/2021   CO2 26 03/15/2021   GLUCOSE 102 (H) 03/15/2021   BUN 22 03/15/2021   CREATININE 1.71 (H) 03/15/2021   BILITOT 0.6 03/15/2021   ALKPHOS 53 03/15/2021   AST 27 03/15/2021   ALT 27 03/15/2021   PROT 6.2 (L) 03/15/2021   ALBUMIN 3.7 03/15/2021   CALCIUM 9.2 03/15/2021   ANIONGAP 5 03/15/2021   EGFR 46 (L) 02/12/2021   GFR 47.34 (L) 02/01/2020   Lab Results  Component Value Date   CHOL 109 01/24/2021   Lab Results  Component Value Date   HDL 44 01/24/2021   Lab Results  Component Value Date   LDLCALC 48 01/24/2021   Lab Results  Component Value Date   TRIG 84 01/24/2021   Lab Results  Component Value Date   CHOLHDL 2.5 01/24/2021   No results found for: HGBA1C    Assessment & Plan:   Ongoing left lumbar back pain with left lower extremity radiculitis symptoms.  Symptoms have been ongoing for several weeks and not improved with gabapentin or prednisone.  Does have concern for mild weakness left dorsiflexion.  -Set up MRI lumbar spine to further assess.    Follow-up: No follow-ups on file.    [name], MD

## 2021-06-20 DIAGNOSIS — K219 Gastro-esophageal reflux disease without esophagitis: Secondary | ICD-10-CM | POA: Diagnosis not present

## 2021-06-20 DIAGNOSIS — I25119 Atherosclerotic heart disease of native coronary artery with unspecified angina pectoris: Secondary | ICD-10-CM | POA: Diagnosis not present

## 2021-06-20 DIAGNOSIS — R32 Unspecified urinary incontinence: Secondary | ICD-10-CM | POA: Diagnosis not present

## 2021-06-20 DIAGNOSIS — I10 Essential (primary) hypertension: Secondary | ICD-10-CM | POA: Diagnosis not present

## 2021-06-20 DIAGNOSIS — E785 Hyperlipidemia, unspecified: Secondary | ICD-10-CM | POA: Diagnosis not present

## 2021-06-20 DIAGNOSIS — N529 Male erectile dysfunction, unspecified: Secondary | ICD-10-CM | POA: Diagnosis not present

## 2021-06-20 DIAGNOSIS — H409 Unspecified glaucoma: Secondary | ICD-10-CM | POA: Diagnosis not present

## 2021-06-20 DIAGNOSIS — K5900 Constipation, unspecified: Secondary | ICD-10-CM | POA: Diagnosis not present

## 2021-06-20 DIAGNOSIS — R69 Illness, unspecified: Secondary | ICD-10-CM | POA: Diagnosis not present

## 2021-07-10 ENCOUNTER — Other Ambulatory Visit: Payer: Medicare HMO

## 2021-07-10 ENCOUNTER — Telehealth: Payer: Self-pay | Admitting: Family Medicine

## 2021-07-10 NOTE — Telephone Encounter (Signed)
PT called wanting to speak to his Dr or the nurse in regards to something that was wrote and sent to his insurance that he needs to straighten out with Dr.Burchette.

## 2021-07-10 NOTE — Telephone Encounter (Addendum)
Spoke with the patient. He stated his insurance company sent him a letter that states the order for his MRI was denied. Pt stated he is no longer hurting and does not wish to get the MRI at this time. Nothing further needed.   Dr. Elease Hashimoto is aware of this message.

## 2021-07-31 ENCOUNTER — Encounter: Payer: Self-pay | Admitting: Cardiovascular Disease

## 2021-07-31 ENCOUNTER — Ambulatory Visit: Payer: Medicare HMO | Admitting: Cardiovascular Disease

## 2021-07-31 ENCOUNTER — Other Ambulatory Visit: Payer: Self-pay

## 2021-07-31 VITALS — BP 120/64 | HR 69 | Ht 68.5 in | Wt 133.8 lb

## 2021-07-31 DIAGNOSIS — I2510 Atherosclerotic heart disease of native coronary artery without angina pectoris: Secondary | ICD-10-CM | POA: Diagnosis not present

## 2021-07-31 DIAGNOSIS — I714 Abdominal aortic aneurysm, without rupture, unspecified: Secondary | ICD-10-CM

## 2021-07-31 DIAGNOSIS — E785 Hyperlipidemia, unspecified: Secondary | ICD-10-CM

## 2021-07-31 LAB — BASIC METABOLIC PANEL
BUN/Creatinine Ratio: 17 (ref 10–24)
BUN: 29 mg/dL — ABNORMAL HIGH (ref 8–27)
CO2: 27 mmol/L (ref 20–29)
Calcium: 9.7 mg/dL (ref 8.6–10.2)
Chloride: 103 mmol/L (ref 96–106)
Creatinine, Ser: 1.67 mg/dL — ABNORMAL HIGH (ref 0.76–1.27)
Glucose: 80 mg/dL (ref 65–99)
Potassium: 4.7 mmol/L (ref 3.5–5.2)
Sodium: 141 mmol/L (ref 134–144)
eGFR: 40 mL/min/{1.73_m2} — ABNORMAL LOW (ref >59)

## 2021-07-31 LAB — LIPID PANEL
Chol/HDL Ratio: 2.7 ratio (ref 0.0–5.0)
Cholesterol, Total: 107 mg/dL (ref 100–199)
HDL: 40 mg/dL (ref >39)
LDL Chol Calc (NIH): 50 mg/dL (ref 0–99)
Triglycerides: 89 mg/dL (ref 0–149)
VLDL Cholesterol Cal: 17 mg/dL (ref 5–40)

## 2021-07-31 LAB — ALT: ALT: 17 IU/L (ref 0–44)

## 2021-07-31 NOTE — Patient Instructions (Signed)
Medication Instructions:  Your physician recommends that you continue on your current medications as directed. Please refer to the Current Medication list given to you today.  *If you need a refill on your cardiac medications before your next appointment, please call your pharmacy*   Lab Work: BMET, Lipid, ALT today  If you have labs (blood work) drawn today and your tests are completely normal, you will receive your results only by: [place] (if you have MyChart) OR A paper copy in the mail If you have any lab test that is abnormal or we need to change your treatment, we will call you to review the results.   Testing/Procedures: None   Follow-Up: At [facility], you and your health needs are our priority.  As part of our continuing mission to provide you with exceptional heart care, we have created designated Provider Care Teams.  These Care Teams include your primary Cardiologist (physician) and Advanced Practice Providers (APPs -  Physician Assistants and Nurse Practitioners) who all work together to provide you with the care you need, when you need it.  We recommend signing up for the patient portal called "MyChart".  Sign up information is provided on this After Visit Summary.  MyChart is used to connect with patients for Virtual Visits (Telemedicine).  Patients are able to view lab/test results, encounter notes, upcoming appointments, etc.  Non-urgent messages can be sent to your provider as well.   To learn more about what you can do with MyChart, go to [email or web address].    Your next appointment:   1 year(s)  The format for your next appointment:   In Person  Provider:   You may see Mertie Moores, MD or one of the following Advanced Practice Providers on your designated Care Team:   Richardson Dopp, PA-C Robbie Lis, [place]   Other Instructions

## 2021-07-31 NOTE — Progress Notes (Signed)
[name] Date of Birth  1936-02-07         [address]    [place], [place]  [address]          Problem list: 1. Coronary artery disease- status post remote PTCA to the left complex artery in 1991, CABG 2014 2. Hypertension 3. Hyperlipidemia 4. Prostate Cancer.     [name] is  a 85 y.o. -year-old gentleman with a history of PTCA approximately 22 years ago.  He's not had any episodes of chest pain or shortness of breath. He stays fairly active although he doesn't exercise at the gym.  He has some problems with some arthritis-type pain in his right hip.  The Lipitor causes a little bit of muscular skeletal pain.  May 10, 2013:  He stopped hib Benicar 2 months ago. His BP has been running low at home.  He has been on a new medicine for his prostate and was concerned that his BP would run too low.   He has been taking a varied dose of benicar - 1 tab, 1/2 tab, 1/4 tablet whenever he thinks he needs it.   Sept. 5, 2014:  [name] presents today for worsening shortness of breath and chest pain.  Last Saturday, he mowed the lawn and spread 200 lbs of lime.  He developed severe CP and started to take some NTG.  The pain finally eased up. Several days later, he had recurrent CP while cleaning out the gutters.    He thinks he was just overheated.   He has been recording his BP and has been getting some low readings.     He has been having some hip pain and does not know if he will be able to walk on the treadmill.   Sept. 24, 2014: [name] was seen recently with some worsening chest pain. He had a stress Myoview study which was interpreted as low risk he has a fixed inferoapical defect.  He exertion, he is still having angina.  Lasts 5 minutes. In the center of his chest. No radiatioin.  Occurs only with exertion ( stating mower or Rototiller- never walking around the house.)   Class II - III angina.    Yesterday, he had some mild baseline CP which worsened while  walking around the grocery store.   Dec. 16, 2014:  [name] has had CABG since I last saw him in the office.  Still has chest tenderness - breathing is good, no CP  Chest wall is still tender.  May 11, 2014:  [name] is doing ok.   Jan. 4, 2015: Doing well  July 21, 2015:  Doing well from a cardiac standpoint  Still lonely ,  Mentally not where he needs to be since the death of his wife .   Visits with his 2 daughters frequently    Feb. 16, 2017:  Still grieving over the death of his wife , 16 months ago .   Were married 9 years.  Got some [facility] but was not as effective as he would like    Aug. 16, 2017: No CP or dyspnea  Has easy bruising on his arms due to the ASA walks some on the treadmill  No CP   Feb. 12, 2018  Has had some visual issues. Had a head MRI yesterday .   No CP or dyspnea   Oct. 4, 2019:  Has been having more leg swelling recently .  Still eats some salts  Fixes his own meals .   Still eats some canned veggies  Is not more short of breath compared to his usual . No CP  Has been having swelling of his left lower leg   Aug. 7, 2020:  Doing well.   .  Little of exercise.   Treadmill 30 min a day  No CP or dyspnea with walking   Feb. 9, 2021:  [name] is seen today . Has a hx of CAD and chronic combined CHF No CP .  Breathing is ok   Avoids salt .     07/31/2020:  No cardiac complains Has leg cramps at night  Has occasional low BP readings .  Occasionally associated with some dizziness.  No CP .    Feb. 28, 2022 [name] is seen today for follow of his CAD, AAA Still eating some sal t   July 31, 2021: [name] is seen today for follow-up of his coronary artery disease and abdominal aortic aneurysm.  Blood pressure is much better. Has some orthostasis at times, when he stands up from a squatting position.  Does not know if the symptoms are worse on days that he takes his furosemide ( takes it QOD )  Has lost weight since  last year Wt Readings from Last 3 Encounters:  07/31/21 133 lb 12.8 oz (60.7 kg)  06/19/21 137 lb 6.4 oz (62.3 kg)  06/06/21 141 lb 6.4 oz (64.1 kg)        Current Outpatient Medications on File Prior to Visit  Medication Sig Dispense Refill   aspirin 81 MG EC tablet Take 1 tablet (81 mg total) by mouth daily.     carvedilol (COREG) 12.5 MG tablet TAKE 1 TABLET BY MOUTH TWICE A DAY 180 tablet 2   famotidine (PEPCID) 20 MG tablet Take 20 mg by mouth as needed for heartburn or indigestion.     latanoprost (XALATAN) 0.005 % ophthalmic solution Place 1 drop into both eyes at bedtime.      losartan (COZAAR) 50 MG tablet Take 1 tablet (50 mg total) by mouth daily. 90 tablet 3   Multiple Vitamin (MULTIVITAMIN WITH MINERALS) TABS tablet Take 1 tablet by mouth daily.     polyethylene glycol (MIRALAX / GLYCOLAX) packet Take 17 g by mouth daily as needed (for constipation).     gabapentin (NEURONTIN) 100 MG capsule Take 1 capsule (100 mg total) by mouth at bedtime. (Patient not taking: Reported on 07/31/2021) 30 capsule 1   predniSONE (DELTASONE) 20 MG tablet Take two tablets by mouth daily for one week. (Patient not taking: Reported on 07/31/2021) 14 tablet 0   vitamin B-12 (CYANOCOBALAMIN) 100 MCG tablet Take 100 mcg by mouth daily. (Patient not taking: Reported on 07/31/2021)     No current facility-administered medications on file prior to visit.    Allergies  Allergen Reactions   Bee Venom Other (See Comments)    Reaction swelling   Amoxicillin Rash   Hydrocodone Other (See Comments)    REACTION: disorientation    Past Medical History:  Diagnosis Date   AAA (abdominal aortic aneurysm) (HCC)    AAA (abdominal aortic aneurysm) (HCC)    4-4 cm intrarenal aaa per US aorta 03-28-2019 epic   Benign positional vertigo    Bladder cancer [facility])    Bladder neck contracture    Coronary artery disease CARDIOLOGIST--  DR [facility]   ED (erectile dysfunction)    GERD (gastroesophageal reflux  disease)  H/O hiatal hernia    History of anal fissures    History of colon polyps    History of radiation therapy 08/24/12-10/22/12   prostate 6600cGy/33 sessions--  for biochemical recurrence   Hyperlipidemia    Hypertension    OA (osteoarthritis)    Peripheral vascular disease ([place])    Recurrent prostate adenocarcinoma ([place]) first dx 09/2005--  radial prostatectomy-- gleason 4+3=7, pT2c, negative margin   04/2012--  BIOCHEMICAL  RECURRENCE to 0.33 /  SALVAGE IMRT SEPT to  NOV 2013   S/P CABG x 2    09/  2014   S/P dilatation of esophageal stricture    1992   Sciatica    both hips at times mostly left hip   Sigmoid diverticulosis    SUI (stress urinary incontinence), male    Urethral stricture    Wears glasses     Past Surgical History:  Procedure Laterality Date   CARDIAC CATHETERIZATION  08-27-2013  DR Martinique   CRITICAL OSTIAL LM STENOSIS/  diffuse disease LAD 40-50%  &   pLCx 30-40%/   CARDIOVASCULAR STRESS TEST  08-18-2013  DR NASHER   SMALL/ MEDIUM AREA MILD SCAR INFEROSEPTAL WALL/ NO ISCHEMIA/  LOW RISK SCAN/  EF 62%/  LV WALL MOTION WITH SLIGHT DECREASED OF THE SEPTUM   CORONARY ANGIOPLASTY  1991   PTCA  of LEFT CFX   CORONARY ARTERY BYPASS GRAFT N/A 08/27/2013   Procedure: CORONARY ARTERY BYPASS GRAFTING (CABG) times two using left internal mammary and right saphenous vein using endoscope.;  Surgeon: Melrose Nakayama, MD;  Location: [place];  Service: Open Heart Surgery;  Laterality: N/A;   CYSTOSCOPY WITH URETHRAL DILATATION N/A 01/26/2014   Procedure: CYSTOSCOPY WITH URETHRAL DILATATION, BLADDER BIOPSY, FOLEY CATHETER;  Surgeon: Molli Hazard, MD;  Location: [facility];  Service: Urology;  Laterality: N/A;   INGUINAL HERNIA REPAIR Bilateral 1994   KNEE ARTHROSCOPY Right yrs ago   LEFT HEART CATHETERIZATION WITH CORONARY ANGIOGRAM N/A 08/27/2013   Procedure: LEFT HEART CATHETERIZATION WITH CORONARY ANGIOGRAM;  Surgeon: Peter M Martinique, MD;  Location: [facility] CATH LAB;   Service: Cardiovascular;  Laterality: N/A;   LUMBAR DISC SURGERY  1980'S   RADICAL RETROPUBIC PROSTATECTOMY W/ BILATERAL PELVIC NODE DISSECTION  11-18-2005   TRANSURETHRAL INCISION OF PROSTATE N/A 11/01/2020   Procedure: TRANSURETHRAL INCISION OF THE BLADDER NECK;  Surgeon: Ardis Hughs, MD;  Location: [facility];  Service: Urology;  Laterality: N/A;   URETHROTOMY N/A 05/30/2014   Procedure: CYSTOSCOPY  BALLOON DILATION AND DIRECT VISION INTERNAL URETHROTOMY;  Surgeon: Sharyn Creamer, MD;  Location: [facility];  Service: Urology;  Laterality: N/A;    Social History   Tobacco Use  Smoking Status Former   Packs/day: 2.00   Years: 10.00   Pack years: 20.00   Types: Cigarettes   Quit date: 12/03/1994   Years since quitting: 26.6  Smokeless Tobacco Never    Social History   Substance and Sexual Activity  Alcohol Use Yes   Comment: occasionally    Family History  Problem Relation Age of Onset   Cancer Father        prostate died 25 something   Cancer Brother        prostate cancer   Arthritis Other    Hyperlipidemia Other    Hypertension Other     Reviw of Systems:    Physical Exam: Blood pressure 120/64, pulse 69, height 5' 8.5" (1.74 m), weight 133 lb 12.8 oz (  60.7 kg), SpO2 99 %.  GEN:  Well nourished, well developed in no acute distress HEENT: Normal NECK: No JVD; No carotid bruits LYMPHATICS: No lymphadenopathy CARDIAC: RRR , no murmurs, rubs, gallops RESPIRATORY:  Clear to auscultation without rales, wheezing or rhonchi  ABDOMEN: Soft, non-tender, non-distended MUSCULOSKELETAL:  No edema; No deformity  SKIN: Warm and dry NEUROLOGIC:  Alert and oriented x 3   ECG:      Assessment / Plan:   1. Coronary artery disease-not having any episodes of angina.    2. Hypertension :    Blood pressure is well controlled.  3. Hyperlipidemia-   lipids seem to be well controlled.lipids are well controlled.    4. Prostate  Cancer.     5. Abdominal aortic aneurism  -  Followed by [facility]   6.  Chronic combined congestive heart failure:       No recent dyspnea Is not having any significant shortness of breath.   Mertie Moores, MD  08/03/2021 5:36 PM    [address],  [place], [place]  [address] Pager [phone] Phone: [phone]; Fax: [phone]

## 2021-08-01 ENCOUNTER — Other Ambulatory Visit: Payer: Self-pay | Admitting: Cardiovascular Disease

## 2021-08-01 ENCOUNTER — Telehealth: Payer: Self-pay | Admitting: Cardiovascular Disease

## 2021-08-01 ENCOUNTER — Other Ambulatory Visit: Payer: Self-pay | Admitting: Family Medicine

## 2021-08-01 DIAGNOSIS — I2510 Atherosclerotic heart disease of native coronary artery without angina pectoris: Secondary | ICD-10-CM

## 2021-08-01 DIAGNOSIS — F4321 Adjustment disorder with depressed mood: Secondary | ICD-10-CM

## 2021-08-01 MED ORDER — NITROGLYCERIN 0.4 MG SL SUBL: SUBLINGUAL_TABLET | SUBLINGUAL | 3 refills | Status: AC

## 2021-08-01 NOTE — Telephone Encounter (Signed)
Pt's medication was sent to pt's pharmacy as requested. Confirmation received.  °

## 2021-08-01 NOTE — Telephone Encounter (Signed)
*  STAT* If patient is at the pharmacy, call can be transferred to refill team.   1. Which medications need to be refilled? (please list name of each medication and dose if known)  nitroGLYCERIN (NITROSTAT) 0.4 MG SL tablet  2. Which pharmacy/location (including street and city if local pharmacy) is medication to be sent to? [id number] [place] - [address]  3. Do they need a 30 day or 90 day supply?  90 day supply

## 2021-08-08 ENCOUNTER — Ambulatory Visit (INDEPENDENT_AMBULATORY_CARE_PROVIDER_SITE_OTHER): Payer: Medicare HMO | Admitting: Family Medicine

## 2021-08-08 ENCOUNTER — Other Ambulatory Visit: Payer: Self-pay

## 2021-08-08 VITALS — BP 100/50 | HR 64 | Temp 97.9°F | Wt 131.4 lb

## 2021-08-08 DIAGNOSIS — R208 Other disturbances of skin sensation: Secondary | ICD-10-CM

## 2021-08-08 DIAGNOSIS — Z23 Encounter for immunization: Secondary | ICD-10-CM | POA: Diagnosis not present

## 2021-08-08 NOTE — Progress Notes (Signed)
Established Patient Office Visit  Subjective:  Patient ID: [name], male    DOB: [birth year]  Age: 85 y.o. MRN: [id number]  CC: No chief complaint on file.   HPI [name] presents for abnormal sensation both lower extremities over the past couple of weeks.  He states that he feels like he has "insects under the skin "ankle region bilaterally.  Also describes this as "crawling sensation" lower legs and ankles bilaterally.  No real pain.  Symptoms seem to be improved with walking and worse at rest.  Has both day and night symptoms.  No upper extremity symptoms.  No progressive focal weakness.  Recent B12 and TSH normal.  Does drink a lot of caffeine with about 6 cups of coffee per day.  He has not seen any rashes.  Still has some intermittent lumbar pain.  His insurance would not approve MRI previously.  No active radiculitis symptoms currently.  Past Medical History:  Diagnosis Date   AAA (abdominal aortic aneurysm) ([place])    AAA (abdominal aortic aneurysm) (HCC)    4-4 cm intrarenal aaa per US aorta 03-28-2019 epic   Benign positional vertigo    Bladder cancer (HCC)    Bladder neck contracture    Coronary artery disease CARDIOLOGIST--  DR Acie Fredrickson   ED (erectile dysfunction)    GERD (gastroesophageal reflux disease)    H/O hiatal hernia    History of anal fissures    History of colon polyps    History of radiation therapy 08/24/12-10/22/12   prostate 6600cGy/33 sessions--  for biochemical recurrence   Hyperlipidemia    Hypertension    OA (osteoarthritis)    Peripheral vascular disease ([place])    Recurrent prostate adenocarcinoma ([place]) first dx 09/2005--  radial prostatectomy-- gleason 4+3=7, pT2c, negative margin   04/2012--  BIOCHEMICAL  RECURRENCE to 0.33 /  SALVAGE IMRT SEPT to  NOV 2013   S/P CABG x 2    09/  2014   S/P dilatation of esophageal stricture    1992   Sciatica    both hips at times mostly left hip   Sigmoid diverticulosis    SUI (stress urinary  incontinence), male    Urethral stricture    Wears glasses     Past Surgical History:  Procedure Laterality Date   CARDIAC CATHETERIZATION  08-27-2013  DR Martinique   CRITICAL OSTIAL LM STENOSIS/  diffuse disease LAD 40-50%  &   pLCx 30-40%/   CARDIOVASCULAR STRESS TEST  08-18-2013  DR NASHER   SMALL/ MEDIUM AREA MILD SCAR INFEROSEPTAL WALL/ NO ISCHEMIA/  LOW RISK SCAN/  EF 62%/  LV WALL MOTION WITH SLIGHT DECREASED OF THE SEPTUM   CORONARY ANGIOPLASTY  1991   PTCA  of LEFT CFX   CORONARY ARTERY BYPASS GRAFT N/A 08/27/2013   Procedure: CORONARY ARTERY BYPASS GRAFTING (CABG) times two using left internal mammary and right saphenous vein using endoscope.;  Surgeon: Melrose Nakayama, MD;  Location: [place];  Service: Open Heart Surgery;  Laterality: N/A;   CYSTOSCOPY WITH URETHRAL DILATATION N/A 01/26/2014   Procedure: CYSTOSCOPY WITH URETHRAL DILATATION, BLADDER BIOPSY, FOLEY CATHETER;  Surgeon: Molli Hazard, MD;  [facility];  Service: Urology;  Laterality: N/A;   INGUINAL HERNIA REPAIR Bilateral 1994   KNEE ARTHROSCOPY Right yrs ago   LEFT HEART CATHETERIZATION WITH CORONARY ANGIOGRAM N/A 08/27/2013   Procedure: LEFT HEART CATHETERIZATION WITH CORONARY ANGIOGRAM;  Surgeon: Peter M Martinique, MD;  Location: [facility] CATH LAB;  Service:  Cardiovascular;  Laterality: N/A;   LUMBAR DISC SURGERY  1980'S   RADICAL RETROPUBIC PROSTATECTOMY W/ BILATERAL PELVIC NODE DISSECTION  11-18-2005   TRANSURETHRAL INCISION OF PROSTATE N/A 11/01/2020   Procedure: TRANSURETHRAL INCISION OF THE BLADDER NECK;  Surgeon: Ardis Hughs, MD;  Location: [facility];  Service: Urology;  Laterality: N/A;   URETHROTOMY N/A 05/30/2014   Procedure: CYSTOSCOPY  BALLOON DILATION AND DIRECT VISION INTERNAL URETHROTOMY;  Surgeon: Sharyn Creamer, MD;  Location: [facility];  Service: Urology;  Laterality: N/A;    Family History  Problem Relation Age of Onset   Cancer Father         prostate died 89 something   Cancer Brother        prostate cancer   Arthritis Other    Hyperlipidemia Other    Hypertension Other     Social History   Socioeconomic History   Marital status: Single    Spouse name: Not on file   Number of children: 2   Years of education: 13   Highest education level: Not on file  Occupational History   Occupation: Retired    Fish farm manager: RETIRED  Tobacco Use   Smoking status: Former    Packs/day: 2.00    Years: 10.00    Pack years: 20.00    Types: Cigarettes    Quit date: 12/03/1994    Years since quitting: 26.6   Smokeless tobacco: Never  Vaping Use   Vaping Use: Never used  Substance and Sexual Activity   Alcohol use: Yes    Comment: occasionally   Drug use: No   Sexual activity: Not on file  Other Topics Concern   Not on file  Social History Narrative   Lives alone   Caffeine use: Coffee- 3 cups daily   Soda sometimes.    Social Determinants of Health   Financial Resource Strain: Low Risk    Difficulty of Paying Living Expenses: Not hard at all  Food Insecurity: No Food Insecurity   Worried About Charity fundraiser in the Last Year: Never true   [place] in the Last Year: Never true  Transportation Needs: No Transportation Needs   Lack of Transportation (Medical): No   Lack of Transportation (Non-Medical): No  Physical Activity: Insufficiently Active   Days of Exercise per Week: 3 days   Minutes of Exercise per Session: 30 min  Stress: Stress Concern Present   Feeling of Stress : To some extent  Social Connections: Socially Isolated   Frequency of Communication with Friends and Family: Twice a week   Frequency of Social Gatherings with Friends and Family: Never   Attends Religious Services: Never   Marine scientist or Organizations: No   Attends Archivist Meetings: Never   Marital Status: Widowed  Human resources officer Violence: Not At Risk   Fear of Current or Ex-Partner: No   Emotionally Abused:  No   Physically Abused: No   Sexually Abused: No    Outpatient Medications Prior to Visit  Medication Sig Dispense Refill   aspirin 81 MG EC tablet Take 1 tablet (81 mg total) by mouth daily.     atorvastatin (LIPITOR) 40 MG tablet TAKE 1 TABLET BY MOUTH EVERY DAY 90 tablet 3   carvedilol (COREG) 12.5 MG tablet TAKE 1 TABLET BY MOUTH TWICE A DAY 180 tablet 2   famotidine (PEPCID) 20 MG tablet Take 20 mg by mouth as needed for heartburn or indigestion.  furosemide (LASIX) 20 MG tablet TAKE 1 TABLET (20 MG TOTAL) BY MOUTH 3 (THREE) TIMES A WEEK. ON MONDAY, WEDNESDAY, FRIDAY 36 tablet 3   gabapentin (NEURONTIN) 100 MG capsule Take 1 capsule (100 mg total) by mouth at bedtime. 30 capsule 1   latanoprost (XALATAN) 0.005 % ophthalmic solution Place 1 drop into both eyes at bedtime.      losartan (COZAAR) 50 MG tablet Take 1 tablet (50 mg total) by mouth daily. 90 tablet 3   Multiple Vitamin (MULTIVITAMIN WITH MINERALS) TABS tablet Take 1 tablet by mouth daily.     nitroGLYCERIN (NITROSTAT) 0.4 MG SL tablet PLACE 1 TABLET UNDER THE TONUE EVERY 5 MINUTES AS NEEDED FOR CHEST PAIN. 75 tablet 3   polyethylene glycol (MIRALAX / GLYCOLAX) packet Take 17 g by mouth daily as needed (for constipation).     predniSONE (DELTASONE) 20 MG tablet Take two tablets by mouth daily for one week. 14 tablet 0   sertraline (ZOLOFT) 50 MG tablet TAKE 1 TABLET BY MOUTH EVERY DAY 90 tablet 3   vitamin B-12 (CYANOCOBALAMIN) 100 MCG tablet Take 100 mcg by mouth daily.     No facility-administered medications prior to visit.    Allergies  Allergen Reactions   Bee Venom Other (See Comments)    Reaction swelling   Amoxicillin Rash   Hydrocodone Other (See Comments)    REACTION: disorientation    ROS Review of Systems  Constitutional:  Negative for chills and fever.  Respiratory:  Negative for shortness of breath.   Cardiovascular:  Negative for chest pain.  Skin:  Negative for rash.  Neurological:  Negative  for numbness.     Objective:    Physical Exam Vitals reviewed.  Constitutional:      Appearance: Normal appearance.  Cardiovascular:     Rate and Rhythm: Normal rate and regular rhythm.     Comments: Both feet are warm to touch with good capillary refill. Pulmonary:     Effort: Pulmonary effort is normal.  Musculoskeletal:     Right lower leg: No edema.     Left lower leg: No edema.  Skin:    Findings: No rash.  Neurological:     Mental Status: He is alert.     Comments: He has normal sensory function with monofilament throughout both feet.  Deep tendon reflexes trace ankles bilaterally.  Slightly diminished left knee compared to right  He has good strength with plantarflexion and dorsiflexion and knee extension bilaterally    BP (!) 100/50 (BP Location: Left Arm, Patient Position: Sitting, Cuff Size: Normal)   Pulse 64   Temp 97.9 F (36.6 C) (Oral)   Wt 131 lb 6.4 oz (59.6 kg)   SpO2 98%   BMI 19.69 kg/m  Wt Readings from Last 3 Encounters:  08/08/21 131 lb 6.4 oz (59.6 kg)  07/31/21 133 lb 12.8 oz (60.7 kg)  06/19/21 137 lb 6.4 oz (62.3 kg)     Health Maintenance Due  Topic Date Due   COVID-19 Vaccine (5 - Booster for Pfizer series) 07/06/2021    There are no preventive care reminders to display for this patient.  Lab Results  Component Value Date   TSH 1.45 03/28/2021   Lab Results  Component Value Date   WBC 5.5 03/15/2021   HGB 11.9 (L) 03/15/2021   HCT 36.3 (L) 03/15/2021   MCV 109.3 (H) 03/15/2021   PLT 139 (L) 03/15/2021   Lab Results  Component Value Date   NA 141  07/31/2021   K 4.7 07/31/2021   CO2 27 07/31/2021   GLUCOSE 80 07/31/2021   BUN 29 (H) 07/31/2021   CREATININE 1.67 (H) 07/31/2021   BILITOT 0.6 03/15/2021   ALKPHOS 53 03/15/2021   AST 27 03/15/2021   ALT 17 07/31/2021   PROT 6.2 (L) 03/15/2021   ALBUMIN 3.7 03/15/2021   CALCIUM 9.7 07/31/2021   ANIONGAP 5 03/15/2021   EGFR 40 (L) 07/31/2021   GFR 47.34 (L) 02/01/2020    Lab Results  Component Value Date   CHOL 107 07/31/2021   Lab Results  Component Value Date   HDL 40 07/31/2021   Lab Results  Component Value Date   LDLCALC 50 07/31/2021   Lab Results  Component Value Date   TRIG 89 07/31/2021   Lab Results  Component Value Date   CHOLHDL 2.7 07/31/2021   No results found for: HGBA1C    Assessment & Plan:   Patient presents with bilateral lower extremity sensory dysesthesias.  Question restless leg variant.  Does not have any associated pain.  Recent B12 level normal.  Does drink considerable amount of caffeine.  -Suggest to gradually try to reduce caffeine intake -We discussed possible trial of dopamine agonist such as Requip or Mirapex but at this point he states his symptoms are minor and he wishes to observe.  -Flu vaccine given   No orders of the defined types were placed in this encounter.   Follow-up: No follow-ups on file.    [name], MD

## 2021-08-15 ENCOUNTER — Telehealth: Payer: Self-pay | Admitting: Family Medicine

## 2021-08-15 NOTE — Chronic Care Management (AMB) (Signed)
  Chronic Care Management   Note  08/15/2021 Name: [name] MRN: [id number] DOB: 30-Sep-1936  [name] is a 85 y.o. year old male who is a primary care patient of Burchette, Alinda Sierras, MD. I reached out to [name] by phone today in response to a referral sent by Mr. Bohannon Compher Bellucci's PCP, [name], MD.   Mr. Vanvleck was given information about Chronic Care Management services today including:  CCM service includes personalized support from designated clinical staff supervised by his physician, including individualized plan of care and coordination with other care providers 24/7 contact phone numbers for assistance for urgent and routine care needs. Service will only be billed when office clinical staff spend 20 minutes or more in a month to coordinate care. Only one practitioner may furnish and bill the service in a calendar month. The patient may stop CCM services at any time (effective at the end of the month) by phone call to the office staff.   Patient agreed to services and verbal consent obtained.   Follow up plan:   Tatjana Secretary/administrator

## 2021-08-22 ENCOUNTER — Telehealth: Payer: Self-pay | Admitting: Pharmacist

## 2021-08-22 NOTE — Chronic Care Management (AMB) (Signed)
Chronic Care Management Pharmacy Assistant   Name: [name]  MRN: [id number] DOB: 06-19-1936   Reason for Encounter: Chart prep for [name] the Clinical Pharmacist on 08/27/2021 at 11:00.   Conditions to be addressed/monitored: CAD, HTN, HLD, CKD Stage 3, and GERD, Prostate Cancer and Ostoarthritis   Recent office visits:  08/08/2021 Bruce Post, MD (PCP) - Patient was seen for Dysesthesia both side of body and additional issues. No medication changes. No follow up noted  06/19/2021  Burchette, Alinda Sierras, MD (PCP) - Patient was seen for Left lumbar radiculitis. No medication changes. No follow up noted.  06/06/2021 Burchette, Alinda Sierras, MD (PCP) - Patient was seen for Left lumbar radiculitis. No medication changes. Follow up for PT if no improvement in 2 weeks.  05/30/2021 Burchette, Alinda Sierras, MD (PCP) - Patient was seen for left lumbar pain and additional isssues. Started on Gabapentin 100mg  and Prednisone 20mg . Follow up in 1 week.  03/06/2021 Burchette, Alinda Sierras, MD (PCP) - Patient was seen for LLQ abdominal pain. Started on Cipro 500mg  and Metronidazole 500 mg. No follow up noted.  Recent consult visits:  07/31/2021 Bruce Burke MD (cardiology) - Patient was seen for  Coronary artery disease involving native coronary artery of native heart without angina pectoris +2 more issues. No medication changes. Follow up in 1 year.  04/04/2021 Bruce Mayo MD (vascular surgery) - Patient was seen for AAA (abdominal aortic aneurysm) without rupture. Discontinued Metronidazole 500mg .  No follow up noted.  Hospital visits: Patient was seen at [facility] ED on 03/15/2021 due to dehydration dizziness and diarrhea. He was at the ED for 5 hours. Patient was given IV fluids. Patient instructed to continue hydration at home and will follow up with his PCP for repeat creatinine in several days.    Medications: Outpatient Encounter Medications as of  08/22/2021  Medication Sig   aspirin 81 MG EC tablet Take 1 tablet (81 mg total) by mouth daily.   atorvastatin (LIPITOR) 40 MG tablet TAKE 1 TABLET BY MOUTH EVERY DAY   carvedilol (COREG) 12.5 MG tablet TAKE 1 TABLET BY MOUTH TWICE A DAY   famotidine (PEPCID) 20 MG tablet Take 20 mg by mouth as needed for heartburn or indigestion.   furosemide (LASIX) 20 MG tablet TAKE 1 TABLET (20 MG TOTAL) BY MOUTH 3 (THREE) TIMES A WEEK. ON MONDAY, WEDNESDAY, FRIDAY   gabapentin (NEURONTIN) 100 MG capsule Take 1 capsule (100 mg total) by mouth at bedtime.   latanoprost (XALATAN) 0.005 % ophthalmic solution Place 1 drop into both eyes at bedtime.    losartan (COZAAR) 50 MG tablet Take 1 tablet (50 mg total) by mouth daily.   Multiple Vitamin (MULTIVITAMIN WITH MINERALS) TABS tablet Take 1 tablet by mouth daily.   nitroGLYCERIN (NITROSTAT) 0.4 MG SL tablet PLACE 1 TABLET UNDER THE TONUE EVERY 5 MINUTES AS NEEDED FOR CHEST PAIN.   polyethylene glycol (MIRALAX / GLYCOLAX) packet Take 17 g by mouth daily as needed (for constipation).   predniSONE (DELTASONE) 20 MG tablet Take two tablets by mouth daily for one week.   sertraline (ZOLOFT) 50 MG tablet TAKE 1 TABLET BY MOUTH EVERY DAY   vitamin B-12 (CYANOCOBALAMIN) 100 MCG tablet Take 100 mcg by mouth daily.   No facility-administered encounter medications on file as of 08/22/2021.   Fill History:  ATORVASTATIN 40 MG TABLET 06/28/2021 90   CARVEDILOL 12.5MG  TAB 08/19/2021 90   FUROSEMIDE 20 MG TABLET 08/01/2021 84  LATANOPROST 0.005% EYE DROPS 05/03/2021 90   LOSARTAN POTASSIUM 50 MG TAB 07/29/2021 90   NITROGLYCERIN 0.4 MG TABLET SL 08/01/2021 30   SERTRALINE HCL 50MG  TAB 08/21/2021 90   Have you seen any other providers since your last visit? **no  Any changes in your medications or health? no  Any side effects from any medications? no  Do you have an symptoms or problems not managed by your medications? Patient continues to have back pain and  nothing is helping the pain.  Any concerns about your health right now? Yes, Patient is having problems with incontinence and continues to have back pain.  Has your provider asked that you check blood pressure, blood sugar, or follow special diet at home? No, Patient states he does have a blood pressure cuff at home but is not checking his blood pressures.  Do you get any type of exercise on a regular basis? Yes, Patient is still doing lawn work and works around his house.  Can you think of a goal you would like to reach for your health? No  Do you have any problems getting your medications? no  Is there anything that you would like to discuss during the appointment? Patient is having problems with incontinence and continues to have back pain.  Patient reminded to  bring medications, supplements , BP cuff to appointment  Notes: Patient states he is taking Benadryl for his runny nose and is no longer taking Gabapentin. Patient will typically eat cereal or bacon and eggs with pancakes or french toast for breakfast, a sandwich of some sort for lunch and vegetables, fruits and a starch for dinner, he states he eats very little meat.  Care Gaps:  AWV - scheduled for 05/14/2022 (over 1 year since last done) Covid 19 vaccine booster 5 - overdue 07/06/2021  Star Rating Drugs:  Atorvastatin 40mg  - [facility] Losartan 50[facility]  - last filled 07/29/2021 at [facility] [phone]

## 2021-08-27 ENCOUNTER — Ambulatory Visit: Payer: Medicare HMO

## 2021-08-27 ENCOUNTER — Telehealth: Payer: Self-pay | Admitting: Pharmacist

## 2021-08-27 NOTE — Chronic Care Management (AMB) (Signed)
    Chronic Care Management Pharmacy Assistant   Name: SERGEY ISHLER  MRN: [id number] DOB: Apr 06, 1936   Reason for Encounter: reschedule 08/27/2021 appointment with Jeni Salles. Patient was rescheduled to 09/19/2021 at 12:30   Care Gaps: AWV - scheduled for 05/14/2022 (over 1 year since last done) Covid 19 vaccine booster 5 - overdue 07/06/2021  Star Rating Drugs: Atorvastatin 40mg  - [facility] Losartan 50mg  - last filled 07/29/2021 at [place] [phone]

## 2021-08-28 ENCOUNTER — Other Ambulatory Visit: Payer: Self-pay | Admitting: Cardiovascular Disease

## 2021-09-04 ENCOUNTER — Telehealth: Payer: Self-pay | Admitting: Family Medicine

## 2021-09-04 NOTE — Telephone Encounter (Signed)
Patient wanted to schedule an appointment to see Dr.Burchette this week but there wasn't anything available. He is now requesting to speak with Ria Comment.  Patient could be contacted at [phone].  Please advise.

## 2021-09-04 NOTE — Telephone Encounter (Signed)
Left message for patient to call back  

## 2021-09-10 ENCOUNTER — Ambulatory Visit (INDEPENDENT_AMBULATORY_CARE_PROVIDER_SITE_OTHER): Payer: Medicare HMO | Admitting: Family Medicine

## 2021-09-10 ENCOUNTER — Other Ambulatory Visit: Payer: Self-pay

## 2021-09-10 VITALS — BP 124/60 | HR 60 | Temp 97.8°F | Wt 135.7 lb

## 2021-09-10 DIAGNOSIS — M545 Low back pain, unspecified: Secondary | ICD-10-CM | POA: Diagnosis not present

## 2021-09-10 DIAGNOSIS — G8929 Other chronic pain: Secondary | ICD-10-CM | POA: Diagnosis not present

## 2021-09-10 NOTE — Patient Instructions (Signed)
I will re-order MRI lumbar spine  Let me know if you change your mind regarding setting up physical therapy.

## 2021-09-10 NOTE — Progress Notes (Signed)
Established Patient Office Visit  Subjective:  Patient ID: [name], male    DOB: [birth year]  Age: 85 y.o. MRN: [id number]  CC:  Chief Complaint  Patient presents with   Follow-up    HPI [name] presents for recurrence of mostly right-sided lumbar back pain.  Refer to previous notes for details.  He had some occasional radiation toward the right lower extremity into the thigh.  Occasional paresthesias.  He has remote history of surgery low back years ago.  Most recent x-rays were 2018.  He had L2 compression fracture then and significant degenerative changes and narrowing L5-S1.  We had ordered an MRI scan several weeks ago for his lumbar spine but by the time this was finally approved he was actually doing some better-and he never went..  He does recall doing some yard work recently and seems to reaggravated.  He is concerned about possible weakness especially with negotiating hills or stairs.  No urine or stool incontinence.  Pain is moderate to severe at times.  Pain is worse with changing positions and walking.  Greatly limiting day-to-day activities.  Past Medical History:  Diagnosis Date   AAA (abdominal aortic aneurysm) ([place])    AAA (abdominal aortic aneurysm) ([facility])    4-4 cm intrarenal aaa per US aorta 03-28-2019 epic   Benign positional vertigo    Bladder cancer ([facility])    Bladder neck contracture    Coronary artery disease CARDIOLOGIST--  DR Acie Fredrickson   ED (erectile dysfunction)    GERD (gastroesophageal reflux disease)    H/O hiatal hernia    History of anal fissures    History of colon polyps    History of radiation therapy 08/24/12-10/22/12   prostate 6600cGy/33 sessions--  for biochemical recurrence   Hyperlipidemia    Hypertension    OA (osteoarthritis)    Peripheral vascular disease ([place])    Recurrent prostate adenocarcinoma ([place]) first dx 09/2005--  radial prostatectomy-- gleason 4+3=7, pT2c, negative margin   04/2012--  BIOCHEMICAL  RECURRENCE to 0.33  /  SALVAGE IMRT SEPT to  NOV 2013   S/P CABG x 2    09/  2014   S/P dilatation of esophageal stricture    1992   Sciatica    both hips at times mostly left hip   Sigmoid diverticulosis    SUI (stress urinary incontinence), male    Urethral stricture    Wears glasses     Past Surgical History:  Procedure Laterality Date   CARDIAC CATHETERIZATION  08-27-2013  DR Martinique   CRITICAL OSTIAL LM STENOSIS/  diffuse disease LAD 40-50%  &   pLCx 30-40%/   CARDIOVASCULAR STRESS TEST  08-18-2013  DR NASHER   SMALL/ MEDIUM AREA MILD SCAR INFEROSEPTAL WALL/ NO ISCHEMIA/  LOW RISK SCAN/  EF 62%/  LV WALL MOTION WITH SLIGHT DECREASED OF THE SEPTUM   CORONARY ANGIOPLASTY  1991   PTCA  of LEFT CFX   CORONARY ARTERY BYPASS GRAFT N/A 08/27/2013   Procedure: CORONARY ARTERY BYPASS GRAFTING (CABG) times two using left internal mammary and right saphenous vein using endoscope.;  Surgeon: Melrose Nakayama, MD;  Location: [place];  Service: Open Heart Surgery;  Laterality: N/A;   CYSTOSCOPY WITH URETHRAL DILATATION N/A 01/26/2014   Procedure: CYSTOSCOPY WITH URETHRAL DILATATION, BLADDER BIOPSY, FOLEY CATHETER;  Surgeon: Molli Hazard, MD;  Location: [facility];  Service: Urology;  Laterality: N/A;   INGUINAL HERNIA REPAIR Bilateral 1994   KNEE ARTHROSCOPY Right yrs ago  LEFT HEART CATHETERIZATION WITH CORONARY ANGIOGRAM N/A 08/27/2013   Procedure: LEFT HEART CATHETERIZATION WITH CORONARY ANGIOGRAM;  Surgeon: Peter M Martinique, MD;  Location: [facility] C[facility] LAB;  Service: Cardiovascular;  Laterality: N/A;   LUMBAR DISC SURGERY  1980'S   RADICAL RETROPUBIC PROSTATECTOMY W/ BILATERAL PELVIC NODE DISSECTION  11-18-2005   TRANSURETHRAL INCISION OF PROSTATE N/A 11/01/2020   Procedure: TRANSURETHRAL INCISION OF THE BLADDER NECK;  Surgeon: Ardis Hughs, MD;  [facility];  Service: Urology;  Laterality: N/A;   URETHROTOMY N/A 05/30/2014   Procedure: CYSTOSCOPY  BALLOON DILATION AND DIRECT  VISION INTERNAL URETHROTOMY;  Surgeon: Sharyn Creamer, MD;  Location: [facility];  Service: Urology;  Laterality: N/A;    Family History  Problem Relation Age of Onset   Cancer Father        prostate died 6 something   Cancer Brother        prostate cancer   Arthritis Other    Hyperlipidemia Other    Hypertension Other     Social History   Socioeconomic History   Marital status: Single    Spouse name: Not on file   Number of children: 2   Years of education: 13   Highest education level: Not on file  Occupational History   Occupation: Retired    Fish farm manager: RETIRED  Tobacco Use   Smoking status: Former    Packs/day: 2.00    Years: 10.00    Pack years: 20.00    Types: Cigarettes    Quit date: 12/03/1994    Years since quitting: 26.7   Smokeless tobacco: Never  Vaping Use   Vaping Use: Never used  Substance and Sexual Activity   Alcohol use: Yes    Comment: occasionally   Drug use: No   Sexual activity: Not on file  Other Topics Concern   Not on file  Social History Narrative   Lives alone   Caffeine use: Coffee- 3 cups daily   Soda sometimes.    Social Determinants of Health   Financial Resource Strain: Low Risk    Difficulty of Paying Living Expenses: Not hard at all  Food Insecurity: No Food Insecurity   Worried About Charity fundraiser in the Last Year: Never true   [place] in the Last Year: Never true  Transportation Needs: No Transportation Needs   Lack of Transportation (Medical): No   Lack of Transportation (Non-Medical): No  Physical Activity: Insufficiently Active   Days of Exercise per Week: 3 days   Minutes of Exercise per Session: 30 min  Stress: Stress Concern Present   Feeling of Stress : To some extent  Social Connections: Socially Isolated   Frequency of Communication with Friends and Family: Twice a week   Frequency of Social Gatherings with Friends and Family: Never   Attends Religious Services: Never    Marine scientist or Organizations: No   Attends Archivist Meetings: Never   Marital Status: Widowed  Human resources officer Violence: Not At Risk   Fear of Current or Ex-Partner: No   Emotionally Abused: No   Physically Abused: No   Sexually Abused: No    Outpatient Medications Prior to Visit  Medication Sig Dispense Refill   aspirin 81 MG EC tablet Take 1 tablet (81 mg total) by mouth daily.     atorvastatin (LIPITOR) 40 MG tablet TAKE 1 TABLET BY MOUTH EVERY DAY 90 tablet 3   carvedilol (COREG) 12.5 MG  tablet TAKE 1 TABLET BY MOUTH TWICE A DAY 180 tablet 3   famotidine (PEPCID) 20 MG tablet Take 20 mg by mouth as needed for heartburn or indigestion.     furosemide (LASIX) 20 MG tablet TAKE 1 TABLET (20 MG TOTAL) BY MOUTH 3 (THREE) TIMES A WEEK. ON MONDAY, WEDNESDAY, FRIDAY 36 tablet 3   gabapentin (NEURONTIN) 100 MG capsule Take 1 capsule (100 mg total) by mouth at bedtime. 30 capsule 1   latanoprost (XALATAN) 0.005 % ophthalmic solution Place 1 drop into both eyes at bedtime.      losartan (COZAAR) 50 MG tablet Take 1 tablet (50 mg total) by mouth daily. 90 tablet 3   Multiple Vitamin (MULTIVITAMIN WITH MINERALS) TABS tablet Take 1 tablet by mouth daily.     nitroGLYCERIN (NITROSTAT) 0.4 MG SL tablet PLACE 1 TABLET UNDER THE TONUE EVERY 5 MINUTES AS NEEDED FOR CHEST PAIN. 75 tablet 3   polyethylene glycol (MIRALAX / GLYCOLAX) packet Take 17 g by mouth daily as needed (for constipation).     predniSONE (DELTASONE) 20 MG tablet Take two tablets by mouth daily for one week. 14 tablet 0   sertraline (ZOLOFT) 50 MG tablet TAKE 1 TABLET BY MOUTH EVERY DAY 90 tablet 3   vitamin B-12 (CYANOCOBALAMIN) 100 MCG tablet Take 100 mcg by mouth daily.     No facility-administered medications prior to visit.    Allergies  Allergen Reactions   Bee Venom Other (See Comments)    Reaction swelling   Amoxicillin Rash   Hydrocodone Other (See Comments)    REACTION: disorientation     ROS Review of Systems  Constitutional:  Negative for activity change, appetite change and fever.  Respiratory:  Negative for cough and shortness of breath.   Cardiovascular:  Negative for chest pain and leg swelling.  Gastrointestinal:  Negative for abdominal pain and vomiting.  Genitourinary:  Negative for dysuria, flank pain and hematuria.  Musculoskeletal:  Positive for back pain. Negative for joint swelling.  Neurological:  Positive for weakness. Negative for numbness.     Objective:    Physical Exam Vitals reviewed.  Constitutional:      Appearance: Normal appearance.  Cardiovascular:     Rate and Rhythm: Normal rate and regular rhythm.  Musculoskeletal:     Comments: No spinal tenderness.  Good range of motion right hip.  No lateral hip tenderness.  Neurological:     Mental Status: He is alert.     Comments: Generally weak in both lower extremities with knee extension and hip flexion bilaterally.  He is able to dorsiflex and plantarflex equally on both sides  Deep tendon reflexes are absent left knee and trace to 1+ right and trace bilaterally in the ankle    BP 124/60 (BP Location: Left Arm, Patient Position: Sitting, Cuff Size: Normal)   Pulse 60   Temp 97.8 F (36.6 C) (Oral)   Wt 135 lb 11.2 oz (61.6 kg)   SpO2 100%   BMI 20.33 kg/m  Wt Readings from Last 3 Encounters:  09/10/21 135 lb 11.2 oz (61.6 kg)  08/08/21 131 lb 6.4 oz (59.6 kg)  07/31/21 133 lb 12.8 oz (60.7 kg)     Health Maintenance Due  Topic Date Due   COVID-19 Vaccine (5 - Booster for Pfizer series) 07/06/2021    There are no preventive care reminders to display for this patient.  Lab Results  Component Value Date   TSH 1.45 03/28/2021   Lab Results  Component  Value Date   WBC 5.5 03/15/2021   HGB 11.9 (L) 03/15/2021   HCT 36.3 (L) 03/15/2021   MCV 109.3 (H) 03/15/2021   PLT 139 (L) 03/15/2021   Lab Results  Component Value Date   NA 141 07/31/2021   K 4.7 07/31/2021    CO2 27 07/31/2021   GLUCOSE 80 07/31/2021   BUN 29 (H) 07/31/2021   CREATININE 1.67 (H) 07/31/2021   BILITOT 0.6 03/15/2021   ALKPHOS 53 03/15/2021   AST 27 03/15/2021   ALT 17 07/31/2021   PROT 6.2 (L) 03/15/2021   ALBUMIN 3.7 03/15/2021   CALCIUM 9.7 07/31/2021   ANIONGAP 5 03/15/2021   EGFR 40 (L) 07/31/2021   GFR 47.34 (L) 02/01/2020   Lab Results  Component Value Date   CHOL 107 07/31/2021   Lab Results  Component Value Date   HDL 40 07/31/2021   Lab Results  Component Value Date   LDLCALC 50 07/31/2021   Lab Results  Component Value Date   TRIG 89 07/31/2021   Lab Results  Component Value Date   CHOLHDL 2.7 07/31/2021   No results found for: HGBA1C    Assessment & Plan:   Chronic intermittent back pain with occasional right-sided radiculitis symptoms.  -We discussed possible physical therapy options but he declines.  He has tried several home exercises for weeks without much improvement. -He specifically wants to pursue MRI scanning at this time which we had previously ordered.  We will see if we can get set up  No orders of the defined types were placed in this encounter.   Follow-up: No follow-ups on file.    [name], MD

## 2021-09-18 ENCOUNTER — Telehealth: Payer: Self-pay | Admitting: Pharmacist

## 2021-09-18 NOTE — Chronic Care Management (AMB) (Signed)
    Chronic Care Management Pharmacy Assistant   Name: ROCKNE DEARINGER  MRN: [id number] DOB: [birth year]  09/19/2021 APPOINTMENT REMINDER   Called [name], No answer, left message of appointment on 09/19/2021 at 12:30 via office visit with Jeni Salles, Pharm D. Notified to have all medications, supplements, blood pressure and/or blood sugar logs and blood pressure monitor available during appointment and to return call if need to reschedule.  Care Gaps: AWV - scheduled for 05/14/2022 (over 1 year since last done) Covid 19 vaccine booster 5 - overdue 07/06/2021 Last BP - 124/60 on 09/10/2021  Star Rating Drug: Atorvastatin 40mg  - [facility]  - [facility]  Any gaps in medications fill history? No  [place]  Catering manager [phone]

## 2021-09-19 ENCOUNTER — Ambulatory Visit (INDEPENDENT_AMBULATORY_CARE_PROVIDER_SITE_OTHER): Payer: Medicare HMO | Admitting: Pharmacist

## 2021-09-19 ENCOUNTER — Other Ambulatory Visit: Payer: Self-pay

## 2021-09-19 VITALS — BP 150/70 | HR 53

## 2021-09-19 DIAGNOSIS — E785 Hyperlipidemia, unspecified: Secondary | ICD-10-CM

## 2021-09-19 DIAGNOSIS — I2510 Atherosclerotic heart disease of native coronary artery without angina pectoris: Secondary | ICD-10-CM

## 2021-09-19 NOTE — Progress Notes (Signed)
Chronic Care Management Pharmacy Note  10/01/2021 Name:  [name] MRN:  [id number] DOB:  16-Jun-1936  Summary: BP not at goal < 140/90 in office  Recommendations/Changes made from today's visit: -Recommended considering counseling -Recommended routine BP monitoring at home  Plan: BP assessment in 2 months   Subjective: [name] is an 85 y.o. year old male who is a primary patient of Burchette, Alinda Sierras, MD.  The CCM team was consulted for assistance with disease management and care coordination needs.    Engaged with patient face to face for initial visit in response to provider referral for pharmacy case management and/or care coordination services.   Consent to Services:  The patient was given the following information about Chronic Care Management services today, agreed to services, and gave verbal consent: 1. CCM service includes personalized support from designated clinical staff supervised by the primary care provider, including individualized plan of care and coordination with other care providers 2. 24/7 contact phone numbers for assistance for urgent and routine care needs. 3. Service will only be billed when office clinical staff spend 20 minutes or more in a month to coordinate care. 4. Only one practitioner may furnish and bill the service in a calendar month. 5.The patient may stop CCM services at any time (effective at the end of the month) by phone call to the office staff. 6. The patient will be responsible for cost sharing (co-pay) of up to 20% of the service fee (after annual deductible is met). Patient agreed to services and consent obtained.  Patient Care Team: [name], MD as PCP - General Nahser, Wonda Cheng, MD as PCP - Cardiology (Cardiology) Sharyne Peach, MD as Consulting Physician (Ophthalmology) Viona Gilmore, [facility] as Pharmacist (Pharmacist)  Recent office visits: 09/10/21 [name], MD: Patient presented for right sided low  back pain. Plan for MRI.  08/08/2021 [name], MD (PCP) - Patient was seen for Dysesthesia both side of body and additional issues. No medication changes. No follow up noted   06/19/2021  Burchette, Alinda Sierras, MD (PCP) - Patient was seen for Left lumbar radiculitis. No medication changes. No follow up noted.   06/06/2021 Burchette, Alinda Sierras, MD (PCP) - Patient was seen for Left lumbar radiculitis. No medication changes. Follow up for PT if no improvement in 2 weeks.   05/30/2021 Burchette, Alinda Sierras, MD (PCP) - Patient was seen for left lumbar pain and additional isssues. Started on Gabapentin 132m and Prednisone 211m Follow up in 1 week.  Recent consult visits: 07/31/2021 PhCleatrice BurkeD (cardiology) - Patient was seen for  Coronary artery disease involving native coronary artery of native heart without angina pectoris +2 more issues. No medication changes. Follow up in 1 year.   04/04/2021 ChDeitra MayoD (vascular surgery) - Patient was seen for AAA (abdominal aortic aneurysm) without rupture. Discontinued Metronidazole 50082m No follow up noted.  Hospital visits: [facility] due to dehydration dizziness and diarrhea. He was at the ED for 5 hours. Patient was given IV fluids. Patient instructed to continue hydration at home and will follow up with his PCP for repeat creatinine in several days.    Objective:  Lab Results  Component Value Date   CREATININE 1.67 (H) 07/31/2021   BUN 29 (H) 07/31/2021   GFR 47.34 (L) 02/01/2020   GFRNONAA 39 (L) 03/15/2021   GFRAA 56 (L) 01/24/2021   NA 141 07/31/2021  K 4.7 07/31/2021   CALCIUM 9.7 07/31/2021   CO2 27 07/31/2021   GLUCOSE 80 07/31/2021    Lab Results  Component Value Date/Time   GFR 47.34 (L) 02/01/2020 12:00 PM   GFR 45.59 (L) 04/20/2019 08:43 AM    Last diabetic Eye exam: No results found for: HMDIABEYEEXA  Last diabetic Foot exam: No results found for:  HMDIABFOOTEX   Lab Results  Component Value Date   CHOL 107 07/31/2021   HDL 40 07/31/2021   LDLCALC 50 07/31/2021   TRIG 89 07/31/2021   CHOLHDL 2.7 07/31/2021    Hepatic Function Latest Ref Rng & Units 07/31/2021 03/15/2021 01/24/2021  Total Protein 6.5 - 8.1 g/dL - 6.2(L) 6.6  Albumin 3.5 - 5.0 g/dL - 3.7 4.2  AST 15 - 41 U/L - 27 20  ALT 0 - 44 IU/L _0 Alk Phosphatase 38 - 126 U/L - 53 81  Total Bilirubin 0.3 - 1.2 mg/dL - 0.6 0.4  Bilirubin, Direct 0.00 - 0.40 mg/dL - - 0.14    Lab Results  Component Value Date/Time   TSH 1.45 03/28/2021 10:41 AM   TSH 0.93 10/28/2012 11:51 AM    CBC Latest Ref Rng & Units 03/15/2021 03/06/2021 11/01/2020  WBC 4.0 - 10.5 K/uL 5.5 4.8 -  Hemoglobin 13.0 - 17.0 g/dL 11.9(L) 11.8(L) 12.9(L)  Hematocrit 39.0 - 52.0 % 36.3(L) 34.6(L) 38.0(L)  Platelets 150 - 400 K/uL 139(L) 123.0(L) -    No results found for: VD25OH  Clinical ASCVD: Yes  The ASCVD Risk score (Arnett DK, et al., 2019) failed to calculate for the following reasons:   The 2019 ASCVD risk score is only valid for ages 71 to 92    Depression screen PHQ 2/9 09/19/2021 05/08/2021 09/28/2019  Decreased Interest 1 0 0  Down, Depressed, Hopeless 0 1 1  PHQ - 2 Score _1 Altered sleeping 0 - -  Tired, decreased energy 0 - -  Change in appetite 1 - -  Feeling bad or failure about yourself  0 - -  Trouble concentrating 0 - -  Moving slowly or fidgety/restless 0 - -  Suicidal thoughts 0 - -  PHQ-9 Score 2 - -  Difficult doing work/chores Not difficult at all - -  Some recent data might be hidden      Social History   Tobacco Use  Smoking Status Former   Packs/day: 2.00   Years: 10.00   Pack years: 20.00   Types: Cigarettes   Quit date: 12/03/1994   Years since quitting: 26.8  Smokeless Tobacco Never   BP Readings from Last 3 Encounters:  09/19/21 (!) 150/70  09/10/21 124/60  08/08/21 (!) 100/50   Pulse Readings from Last 3 Encounters:  09/19/21 (!) 53   09/10/21 60  08/08/21 64   Wt Readings from Last 3 Encounters:  09/10/21 135 lb 11.2 oz (61.6 kg)  08/08/21 131 lb 6.4 oz (59.6 kg)  07/31/21 133 lb 12.8 oz (60.7 kg)   BMI Readings from Last 3 Encounters:  09/10/21 20.33 kg/m  08/08/21 19.69 kg/m  07/31/21 20.05 kg/m    Assessment/Interventions: Review of patient past medical history, allergies, medications, health status, including review of consultants reports, laboratory and other test data, was performed as part of comprehensive evaluation and provision of chronic care management services.   SDOH:  (Social Determinants of Health) assessments and interventions performed: Yes SDOH Interventions    Flowsheet Row Most Recent Value  SDOH Interventions  Financial Strain Interventions Intervention Not Indicated  Transportation Interventions Intervention Not Indicated      SDOH Screenings   Alcohol Screen: Not on file  Depression (PHQ2-9): Low Risk    PHQ-2 Score: 2  Financial Resource Strain: Low Risk    Difficulty of Paying Living Expenses: Not hard at all  Food Insecurity: No Food Insecurity   Worried About Charity fundraiser in the Last Year: Never true   Arboriculturist in the Last Year: Never true  Housing: Low Risk    Last Housing Risk Score: 0  Physical Activity: Insufficiently Active   Days of Exercise per Week: 3 days   Minutes of Exercise per Session: 30 min  Social Connections: Socially Isolated   Frequency of Communication with Friends and Family: Twice a week   Frequency of Social Gatherings with Friends and Family: Never   Attends Religious Services: Never   Marine scientist or Organizations: No   Attends Archivist Meetings: Never   Marital Status: Widowed  Stress: Stress Concern Present   Feeling of Stress : To some extent  Tobacco Use: Medium Risk   Smoking Tobacco Use: Former   Smokeless Tobacco Use: Never   Passive Exposure: Not on file  Transportation Needs: No  Transportation Needs   Lack of Transportation (Medical): No   Lack of Transportation (Non-Medical): No   Patient is not working or volunteering right now and he lives alone. Patient does enjoy doing jigsaw puzzles in his spare time. He has two daughters and one is in [place] and one in [place]. He cooks at home mostly and usually makes casseroles and made pumpkin pie last night but he somehow forgot the eggs in the recipe. He does not eat out often.  Patient is doing some back exercises every day for 30 minutes. He is not able to walk with back trouble as he was previously walking 30 mins on treadmill or use to go hiking but is unable to now.  Patient's biggest health concerns are with his back problems and he tries Tylenol some but it doesn't always help. He was on gabapentin for a little while but did not take it for very long as he did not noticed a difference in his pain and is not interested in restarting.   Patient denies any side effects from his current medications but is unsure of what all of his medications are for.  CCM Care Plan  Allergies  Allergen Reactions   Bee Venom Other (See Comments)    Reaction swelling   Amoxicillin Rash   Hydrocodone Other (See Comments)    REACTION: disorientation    Medications Reviewed Today     Reviewed by Viona Gilmore, [facility] (Pharmacist) on 09/19/21 at 22  Med List Status: <None>   Medication Order Taking? Sig Documenting Provider Last Dose Status Informant  acetaminophen (TYLENOL) 500 MG tablet [id number] Yes Take 500 mg by mouth every 6 (six) hours as needed. [provider] Taking Active   aspirin 81 MG EC tablet [id number] Yes Take 1 tablet (81 mg total) by mouth daily. Nahser, Wonda Cheng, MD Taking Active Self  atorvastatin (LIPITOR) 40 MG tablet [id number] Yes TAKE 1 TABLET BY MOUTH EVERY DAY Burchette, Alinda Sierras, MD Taking Active   carvedilol (COREG) 12.5 MG tablet [id number] Yes TAKE 1 TABLET BY MOUTH TWICE A DAY Nahser,  Wonda Cheng, MD Taking Active   Cyanocobalamin (VITAMIN B12) 1000 MCG TBCR [id number] Yes Take 100 mcg by  mouth daily. [provider] Taking Active   famotidine (PEPCID) 20 MG tablet [id number] Yes Take 20 mg by mouth as needed for heartburn or indigestion. [provider] Taking Active   furosemide (LASIX) 20 MG tablet [id number] Yes TAKE 1 TABLET (20 MG TOTAL) BY MOUTH 3 (THREE) TIMES A WEEK. ON MONDAY, WEDNESDAY, FRIDAY Nahser, Wonda Cheng, MD Taking Active   latanoprost (XALATAN) 0.005 % ophthalmic solution [id number] Yes Place 1 drop into both eyes at bedtime.  [provider] Taking Active Self  losartan (COZAAR) 50 MG tablet [id number] Yes Take 1 tablet (50 mg total) by mouth daily. Nahser, Wonda Cheng, MD Taking Active   Multiple Vitamin (MULTIVITAMIN WITH MINERALS) TABS tablet [id number] Yes Take 1 tablet by mouth daily. [provider] Taking Active Self  nitroGLYCERIN (NITROSTAT) 0.4 MG SL tablet [id number] No PLACE 1 TABLET UNDER THE TONUE EVERY 5 MINUTES AS NEEDED FOR CHEST PAIN.  Patient not taking: Reported on 09/19/2021   Nahser, Wonda Cheng, MD Not Taking Active   polyethylene glycol [facility] / GLYCOLAX) packet [id number] Yes Take 17 g by mouth daily as needed (for constipation). [provider] Taking Active            Med Note Ovidio Kin Dec 20, 2020 11:05 AM)    sertraline (ZOLOFT) 50 MG tablet [id number] Yes TAKE 1 TABLET BY MOUTH EVERY DAY Burchette, Alinda Sierras, MD Taking Active             Patient Active Problem List   Diagnosis Date Noted   CKD (chronic kidney disease) stage 3, GFR 30-59 ml/min ([place]) 09/28/2019   Visual disturbance 01/07/2017   Adjustment disorder with depressed mood 01/24/2015   S/P CABG x 2 10/12/2013   Vitamin B 12 deficiency 01/19/2013   Abdominal aortic aneurysm 06/02/2012   ACUTE SINUSITIS, UNSPECIFIED 11/27/2010   Hyperlipidemia 04/06/2010   BENIGN POSITIONAL VERTIGO 04/06/2010   Essential hypertension  04/06/2010   CAD (coronary artery disease) 04/06/2010   GERD 04/06/2010   SKIN LESION 04/06/2010   OSTEOARTHRITIS, GENERALIZED 04/06/2010   Prostate cancer ([place]) 12/03/2004    Immunization History  Administered Date(s) Administered   Fluad Quad(high Dose 65+) 08/03/2020, 08/08/2021   Influenza Split 08/25/2012   Influenza Whole 08/16/2010   Influenza, High Dose Seasonal PF 08/12/2016, 08/20/2017   Influenza,inj,Quad PF,6+ Mos 08/24/2013, 08/04/2014, 08/10/2015   Influenza,inj,quad, With Preservative 11/01/2017   Influenza-Unspecified 08/11/2018   PFIZER Comirnaty(Gray Top)Covid-19 Tri-Sucrose Vaccine 03/06/2021   PFIZER(Purple Top)SARS-COV-2 Vaccination 01/06/2020, 01/31/2020, 09/05/2020   Pfizer Covid-19 Vaccine Bivalent Booster 8yr & up 08/23/2021   Pneumococcal Conjugate-13 08/04/2014   Pneumococcal Polysaccharide-23 12/05/2004   Td 11/08/2010, 05/21/2019   Zoster Recombinat (Shingrix) 10/25/2017, 01/29/2018    Conditions to be addressed/monitored:  Hypertension, Hyperlipidemia, Coronary Artery Disease, GERD, Chronic Kidney Disease, Depression, Osteoarthritis, and Vitamin B12 deficiency  Care Plan : CCM Pharmacy Care Plan  Updates made by PViona Gilmore RWhittiersince 10/01/2021 12:00 AM     Problem: Problem: Hypertension, Hyperlipidemia, Coronary Artery Disease, GERD, Chronic Kidney Disease, Depression, Osteoarthritis, and Vitamin B12 deficiency      Long-Range Goal: Patient-Specific Goal   Start Date: 09/19/2021  Expected End Date: 09/19/2022  This Visit's Progress: On track  Priority: High  Note:   Current Barriers:  Unable to independently monitor therapeutic efficacy  Pharmacist Clinical Goal(s):  Patient will achieve adherence to monitoring guidelines and medication adherence to achieve therapeutic efficacy through collaboration with PharmD and provider.   Interventions: 1:1 collaboration  with [name], MD regarding development and update of  comprehensive plan of care as evidenced by provider attestation and co-signature Inter-disciplinary care team collaboration (see longitudinal plan of care) Comprehensive medication review performed; medication list updated in electronic medical record  Hypertension (BP goal <130/80) -Controlled -Current treatment: Losartan 50 mg  1 tablet daily - in AM Carvedilol 12.5 mg 1 tablet twice daily -Medications previously tried: n/a  -Current home readings: 90/47, 146/96, 148/96, 113/66, 111/67, 103/64 (arm cuff - not had for a while) -Current dietary habits: limits salt intake; uses it sometimes (doesn't look at package labels); canned foods and frozen vegetables  -Current exercise habits: not able to with back pain -Denies hypotensive/hypertensive symptoms -Educated on BP goals and benefits of medications for prevention of heart attack, stroke and kidney damage; Exercise goal of 150 minutes per week; Importance of home blood pressure monitoring; Proper BP monitoring technique; -Counseled to monitor BP at home weekly, document, and provide log at future appointments -Counseled on diet and exercise extensively Recommended to continue current medication  Hyperlipidemia: (LDL goal < 70) -Controlled -Current treatment: Atorvastatin 40 mg 1 tablet daily -Medications previously tried: none  -Current dietary patterns: doesn't eat fried foods very often; not eating much meat, chicken, fish some -Current exercise habits: not able to do much with back pain -Educated on Cholesterol goals;  Benefits of statin for ASCVD risk reduction; Importance of limiting foods high in cholesterol; -Counseled on diet and exercise extensively Recommended to continue current medication  Heart Failure (Goal: manage symptoms and prevent exacerbations) -Controlled -Last ejection fraction:  45-50% (Date: 09/01/18) -HF type: Systolic -NYHA Class: II (slight limitation of activity) -AHA HF Stage: C (Heart disease and  symptoms present) -Current treatment: Losartan 50 mg  1 tablet daily Carvedilol 12.5 mg 1 tablet twice daily Furosemide  20 mg 1 tablet three times a week -Medications previously tried: n/a  -Current home BP/HR readings: refer to above -Current dietary habits: tries to limit salt intake -Current exercise habits: unable to do much -Educated on Benefits of medications for managing symptoms and prolonging life Importance of weighing daily; if you gain more than 3 pounds in one day or 5 pounds in one week, call cardiology. Importance of blood pressure control -Counseled on diet and exercise extensively Recommended to continue current medication   Depression (Goal: minimize symptoms) -Not ideally controlled -Current treatment: Sertraline 50 mg 1 tablet daily -Medications previously tried/failed: n/a -PHQ9: 2  -GAD7: n/a -Educated on Benefits of medication for symptom control Benefits of cognitive-behavioral therapy with or without medication -Recommended to continue current medication  CAD (Goal: prevent heart events) -Controlled -Current treatment  Aspirin 81 mg 1 tablet daily Atorvastatin 40 mg 1 tablet daily Nitroglycerin 0.4 mg 1 tablet as needed -Medications previously tried: none  -Recommended to continue current medication Counseled on monitoring for bleeding and avoidance of NSAIDs.  GERD (Goal: minimize symptoms) -Controlled -Current treatment  Famotidine 20 mg 1 tablet as needed -Medications previously tried: n/a  -Counseled on non-pharmacologic management of symptoms such as elevating the head of your bed, avoiding eating 2-3 hours before bed, avoiding triggering foods such as acidic, spicy, or fatty foods, eating smaller meals, and wearing clothes that are loose around the waist  Health Maintenance -Vaccine gaps: none -Current therapy:  Miralax as needed Vitamin B12 100 mcg daily Multivitamin 1 tablet daily Latanoprost 0.05% 1 drop in both eyes at  bedtime -Educated on Cost vs benefit of each product must be carefully weighed by individual consumer -  Patient is satisfied with current therapy and denies issues -Recommended to continue current medication  Patient Goals/Self-Care Activities Patient will:  - take medications as prescribed as evidenced by patient report and record review check blood pressure weekly, document, and provide at future appointments target a minimum of 150 minutes of moderate intensity exercise weekly  Follow Up Plan: Telephone follow up appointment with care management team member scheduled for: 6 months       Medication Assistance: None required.  Patient affirms current coverage meets needs.  Compliance/Adherence/Medication fill history: Care Gaps: AWV - scheduled for 05/14/2022 (over 1 year since last done) Covid 19 vaccine booster 5 - overdue 07/06/2021 Last BP - 124/60 on 09/10/2021  Star-Rating Drugs: Atorvastatin 86m - [facility] Losartan 552m- last [facility]  [place]  [facility] [address]: [phone]: [id number]: [phone]: [phone] pill box? Yes - weekly - just one time slot Pt endorses 99% compliance  We discussed: Current pharmacy is preferred with insurance plan and patient is satisfied with pharmacy services Patient decided to: Continue current medication management strategy  Care Plan and Follow Up Patient Decision:  Patient agrees to Care Plan and Follow-up.  Plan: Telephone follow up appointment with care management team member scheduled for:  6 months  MadJeni SallesharmD, BCAMingoville BraUpper [phone]

## 2021-10-01 DIAGNOSIS — I2510 Atherosclerotic heart disease of native coronary artery without angina pectoris: Secondary | ICD-10-CM

## 2021-10-01 DIAGNOSIS — E785 Hyperlipidemia, unspecified: Secondary | ICD-10-CM

## 2021-10-01 NOTE — Patient Instructions (Signed)
Hi [name],  It was great to get to meet you in person! Below is a summary of some of the topics we discussed.   Please reach out to me if you have any questions or need anything before our follow up!  Best, Maddie  Jeni Salles, PharmD, [facility] [place]   Visit Information   Goals Addressed             This Visit's Progress    Track and Manage My Blood Pressure-Hypertension       Timeframe:  Long-Range Goal Priority:  Medium Start Date:                             Expected End Date:                       Follow Up Date 12/21/21    - check blood pressure weekly - choose a place to take my blood pressure (home, clinic or office, retail store) - write blood pressure results in a log or diary    Why is this important?   You won't feel high blood pressure, but it can still hurt your blood vessels.  High blood pressure can cause heart or kidney problems. It can also cause a stroke.  Making lifestyle changes like losing a little weight or eating less salt will help.  Checking your blood pressure at home and at different times of the day can help to control blood pressure.  If the doctor prescribes medicine remember to take it the way the doctor ordered.  Call the office if you cannot afford the medicine or if there are questions about it.     Notes:        Patient Care Plan: CCM Pharmacy Care Plan     Problem Identified: Problem: Hypertension, Hyperlipidemia, Coronary Artery Disease, GERD, Chronic Kidney Disease, Depression, Osteoarthritis, and Vitamin B12 deficiency      Long-Range Goal: Patient-Specific Goal   Start Date: 09/19/2021  Expected End Date: 09/19/2022  This Visit's Progress: On track  Priority: High  Note:   Current Barriers:  Unable to independently monitor therapeutic efficacy  Pharmacist Clinical Goal(s):  Patient will achieve adherence to monitoring guidelines and medication adherence to  achieve therapeutic efficacy through collaboration with PharmD and provider.   Interventions: 1:1 collaboration with [name], MD regarding development and update of comprehensive plan of care as evidenced by provider attestation and co-signature Inter-disciplinary care team collaboration (see longitudinal plan of care) Comprehensive medication review performed; medication list updated in electronic medical record  Hypertension (BP goal <130/80) -Controlled -Current treatment: Losartan 50 mg  1 tablet daily - in AM Carvedilol 12.5 mg 1 tablet twice daily -Medications previously tried: n/a  -Current home readings: 90/47, 146/96, 148/96, 113/66, 111/67, 103/64 (arm cuff - not had for a while) -Current dietary habits: limits salt intake; uses it sometimes (doesn't look at package labels); canned foods and frozen vegetables  -Current exercise habits: not able to with back pain -Denies hypotensive/hypertensive symptoms -Educated on BP goals and benefits of medications for prevention of heart attack, stroke and kidney damage; Exercise goal of 150 minutes per week; Importance of home blood pressure monitoring; Proper BP monitoring technique; -Counseled to monitor BP at home weekly, document, and provide log at future appointments -Counseled on diet and exercise extensively Recommended to continue current medication  Hyperlipidemia: (LDL goal < 70) -Controlled -  Current treatment: Atorvastatin 40 mg 1 tablet daily -Medications previously tried: none  -Current dietary patterns: doesn't eat fried foods very often; not eating much meat, chicken, fish some -Current exercise habits: not able to do much with back pain -Educated on Cholesterol goals;  Benefits of statin for ASCVD risk reduction; Importance of limiting foods high in cholesterol; -Counseled on diet and exercise extensively Recommended to continue current medication  Heart Failure (Goal: manage symptoms and prevent  exacerbations) -Controlled -Last ejection fraction:  45-50% (Date: 09/01/18) -HF type: Systolic -NYHA Class: II (slight limitation of activity) -AHA HF Stage: C (Heart disease and symptoms present) -Current treatment: Losartan 50 mg  1 tablet daily Carvedilol 12.5 mg 1 tablet twice daily Furosemide  20 mg 1 tablet three times a week -Medications previously tried: n/a  -Current home BP/HR readings: refer to above -Current dietary habits: tries to limit salt intake -Current exercise habits: unable to do much -Educated on Benefits of medications for managing symptoms and prolonging life Importance of weighing daily; if you gain more than 3 pounds in one day or 5 pounds in one week, call cardiology. Importance of blood pressure control -Counseled on diet and exercise extensively Recommended to continue current medication   Depression (Goal: minimize symptoms) -Not ideally controlled -Current treatment: Sertraline 50 mg 1 tablet daily -Medications previously tried/failed: n/a -PHQ9: 2  -GAD7: n/a -Educated on Benefits of medication for symptom control Benefits of cognitive-behavioral therapy with or without medication -Recommended to continue current medication  CAD (Goal: prevent heart events) -Controlled -Current treatment  Aspirin 81 mg 1 tablet daily Atorvastatin 40 mg 1 tablet daily Nitroglycerin 0.4 mg 1 tablet as needed -Medications previously tried: none  -Recommended to continue current medication Counseled on monitoring for bleeding and avoidance of NSAIDs.  GERD (Goal: minimize symptoms) -Controlled -Current treatment  Famotidine 20 mg 1 tablet as needed -Medications previously tried: n/a  -Counseled on non-pharmacologic management of symptoms such as elevating the head of your bed, avoiding eating 2-3 hours before bed, avoiding triggering foods such as acidic, spicy, or fatty foods, eating smaller meals, and wearing clothes that are loose around the  waist  Health Maintenance -Vaccine gaps: none -Current therapy:  Miralax as needed Vitamin B12 100 mcg daily Multivitamin 1 tablet daily Latanoprost 0.05% 1 drop in both eyes at bedtime -Educated on Cost vs benefit of each product must be carefully weighed by individual consumer -Patient is satisfied with current therapy and denies issues -Recommended to continue current medication  Patient Goals/Self-Care Activities Patient will:  - take medications as prescribed as evidenced by patient report and record review check blood pressure weekly, document, and provide at future appointments target a minimum of 150 minutes of moderate intensity exercise weekly  Follow Up Plan: Telephone follow up appointment with care management team member scheduled for: 6 months      [name] was given information about Chronic Care Management services today including:  CCM service includes personalized support from designated clinical staff supervised by his physician, including individualized plan of care and coordination with other care providers 24/7 contact phone numbers for assistance for urgent and routine care needs. Standard insurance, coinsurance, copays and deductibles apply for chronic care management only during months in which we provide at least 20 minutes of these services. Most insurances cover these services at 100%, however patients may be responsible for any copay, coinsurance and/or deductible if applicable. This service may help you avoid the need for more expensive face-to-face services. Only one practitioner  may furnish and bill the service in a calendar month. The patient may stop CCM services at any time (effective at the end of the month) by phone call to the office staff.  Patient agreed to services and verbal consent obtained.   The patient verbalized understanding of instructions, educational materials, and care plan provided today and agreed to receive a mailed copy of  patient instructions, educational materials, and care plan.  Telephone follow up appointment with pharmacy team member scheduled for: 6 months  Viona Gilmore, [facility]

## 2021-10-02 ENCOUNTER — Other Ambulatory Visit: Payer: Self-pay

## 2021-10-02 ENCOUNTER — Ambulatory Visit
Admission: RE | Admit: 2021-10-02 | Discharge: 2021-10-02 | Disposition: A | Discharge status: Home / Self Care | Payer: Medicare HMO | Source: Ambulatory Visit | Attending: Family Medicine | Admitting: Family Medicine

## 2021-10-02 DIAGNOSIS — M545 Low back pain, unspecified: Secondary | ICD-10-CM | POA: Diagnosis not present

## 2021-10-02 DIAGNOSIS — M48061 Spinal stenosis, lumbar region without neurogenic claudication: Secondary | ICD-10-CM | POA: Diagnosis not present

## 2021-10-16 DIAGNOSIS — N320 Bladder-neck obstruction: Secondary | ICD-10-CM | POA: Diagnosis not present

## 2021-10-16 DIAGNOSIS — N393 Stress incontinence (female) (male): Secondary | ICD-10-CM | POA: Diagnosis not present

## 2021-10-16 DIAGNOSIS — Z8546 Personal history of malignant neoplasm of prostate: Secondary | ICD-10-CM | POA: Diagnosis not present

## 2021-11-08 DIAGNOSIS — H524 Presbyopia: Secondary | ICD-10-CM | POA: Diagnosis not present

## 2021-11-08 DIAGNOSIS — H18513 Endothelial corneal dystrophy, bilateral: Secondary | ICD-10-CM | POA: Diagnosis not present

## 2021-11-08 DIAGNOSIS — H401131 Primary open-angle glaucoma, bilateral, mild stage: Secondary | ICD-10-CM | POA: Diagnosis not present

## 2021-11-22 ENCOUNTER — Telehealth: Payer: Self-pay | Admitting: Pharmacist

## 2021-11-22 NOTE — Chronic Care Management (AMB) (Signed)
Chronic Care Management Pharmacy Assistant   Name: [name]  MRN: [id number] DOB: 1936-12-01  Reason for Encounter: Disease State / Hypertension Assessment Call   Conditions to be addressed/monitored: HTN   Recent office visits:  None  Recent consult visits:  None  Hospital visits:  None  Medications: Outpatient Encounter Medications as of 11/22/2021  Medication Sig   acetaminophen (TYLENOL) 500 MG tablet Take 500 mg by mouth every 6 (six) hours as needed.   aspirin 81 MG EC tablet Take 1 tablet (81 mg total) by mouth daily.   atorvastatin (LIPITOR) 40 MG tablet TAKE 1 TABLET BY MOUTH EVERY DAY   carvedilol (COREG) 12.5 MG tablet TAKE 1 TABLET BY MOUTH TWICE A DAY   Cyanocobalamin (VITAMIN B12) 1000 MCG TBCR Take 100 mcg by mouth daily.   famotidine (PEPCID) 20 MG tablet Take 20 mg by mouth as needed for heartburn or indigestion.   furosemide (LASIX) 20 MG tablet TAKE 1 TABLET (20 MG TOTAL) BY MOUTH 3 (THREE) TIMES A WEEK. ON MONDAY, WEDNESDAY, FRIDAY   latanoprost (XALATAN) 0.005 % ophthalmic solution Place 1 drop into both eyes at bedtime.    losartan (COZAAR) 50 MG tablet Take 1 tablet (50 mg total) by mouth daily.   Multiple Vitamin (MULTIVITAMIN WITH MINERALS) TABS tablet Take 1 tablet by mouth daily.   nitroGLYCERIN (NITROSTAT) 0.4 MG SL tablet PLACE 1 TABLET UNDER THE TONUE EVERY 5 MINUTES AS NEEDED FOR CHEST PAIN. (Patient not taking: Reported on 09/19/2021)   polyethylene glycol (MIRALAX / GLYCOLAX) packet Take 17 g by mouth daily as needed (for constipation).   sertraline (ZOLOFT) 50 MG tablet TAKE 1 TABLET BY MOUTH EVERY DAY   No facility-administered encounter medications on file as of 11/22/2021.  Fill History: ATORVASTATIN 40 MG TABLET 09/17/2021 90   CARVEDILOL 12.5 MG TABLET 11/08/2021 90   FUROSEMIDE 20 MG TABLET 10/25/2021 84   LOSARTAN POTASSIUM 50 MG TAB 10/28/2021 90   NITROGLYCERIN 0.4 MG TABLET SL 08/01/2021 30   SERTRALINE 50MG  TAB  11/17/2021 90   Reviewed chart prior to disease state call. Spoke with patient regarding BP  Recent Office Vitals: BP Readings from Last 3 Encounters:  09/19/21 (!) 150/70  09/10/21 124/60  08/08/21 (!) 100/50   Pulse Readings from Last 3 Encounters:  09/19/21 (!) 53  09/10/21 60  08/08/21 64    Wt Readings from Last 3 Encounters:  09/10/21 135 lb 11.2 oz (61.6 kg)  08/08/21 131 lb 6.4 oz (59.6 kg)  07/31/21 133 lb 12.8 oz (60.7 kg)     Kidney Function Lab Results  Component Value Date/Time   CREATININE 1.67 (H) 07/31/2021 10:44 AM   CREATININE 1.71 (H) 03/15/2021 10:34 AM   CREATININE 1.55 (H) 01/15/2016 08:50 AM   GFR 47.34 (L) 02/01/2020 12:00 PM   GFRNONAA 39 (L) 03/15/2021 10:34 AM   GFRAA 56 (L) 01/24/2021 07:32 AM    BMP Latest Ref Rng & Units 07/31/2021 03/15/2021 02/12/2021  Glucose 65 - 99 mg/dL 80 102(H) 118(H)  BUN 8 - 27 mg/dL 29(H) 22 24  Creatinine 0.76 - 1.27 mg/dL 1.67(H) 1.71(H) 1.50(H)  BUN/Creat Ratio 10 - 24 17 - 16  Sodium 134 - 144 mmol/L 141 137 140  Potassium 3.5 - 5.2 mmol/L 4.7 4.1 4.1  Chloride 96 - 106 mmol/L 103 106 103  CO2 20 - 29 mmol/L 27 26 24   Calcium 8.6 - 10.2 mg/dL 9.7 9.2 9.1    Current antihypertensive regimen:  Carvedilol  12.5 mg twice daily Losartan 50 mg daily  How often are you checking your Blood Pressure? Patient hasn't been checking blood pressures  Current home BP readings: Patient doesn't recall his last reading, he hasn't checked his blood pressures in a while.   What recent interventions/DTPs have been made by any provider to improve Blood Pressure control since last CPP Visit: Patient denies any new interventions.  Any recent hospitalizations or ED visits since last visit with CPP? Patient denies any hospital visits.  What diet changes have been made to improve Blood Pressure Control?  Patient will typically eat cereal or bacon and eggs with pancakes or french toast for breakfast, a sandwich of some sort for  lunch and vegetables, fruits and a starch for dinner, he states he eats very little meat.  What exercise is being done to improve your Blood Pressure Control?  Patient stays busy doing yard work, he recently raked the leaves in his yard.   Adherence Review: Is the patient currently on ACE/ARB medication? Yes Does the patient have >5 day gap between last estimated fill dates? No   Care Gaps: AWV - scheduled for 05/14/2022  Last BP - 124/60 on 09/10/2021  Star Rating Drugs: Atorvastatin 40mg  - last filled [facility]/17/2021 at [facility] Losartan 50mg  - last filled 10/28/2021 at [facility] Pharmacist Assistant [phone]

## 2021-12-09 ENCOUNTER — Other Ambulatory Visit: Payer: Self-pay | Admitting: Cardiovascular Disease

## 2022-01-03 DIAGNOSIS — Z8546 Personal history of malignant neoplasm of prostate: Secondary | ICD-10-CM | POA: Diagnosis not present

## 2022-01-03 DIAGNOSIS — N393 Stress incontinence (female) (male): Secondary | ICD-10-CM | POA: Diagnosis not present

## 2022-01-03 DIAGNOSIS — N320 Bladder-neck obstruction: Secondary | ICD-10-CM | POA: Diagnosis not present

## 2022-01-16 ENCOUNTER — Ambulatory Visit: Payer: Medicare HMO | Admitting: Family Medicine

## 2022-01-21 ENCOUNTER — Telehealth: Payer: Self-pay | Admitting: Pharmacist

## 2022-01-21 NOTE — Chronic Care Management (AMB) (Signed)
° ° °  Chronic Care Management Pharmacy Assistant   Name: GRAHM ETSITTY  MRN: [id number] DOB: 07/26/1936  Reason for Encounter: Reschedule appointment with Jeni Salles.   Rescheduled with patient to 04/16/2022.  [place] Pharmacist Assistant [phone]

## 2022-01-22 ENCOUNTER — Encounter: Payer: Self-pay | Admitting: Family Medicine

## 2022-01-22 ENCOUNTER — Ambulatory Visit (INDEPENDENT_AMBULATORY_CARE_PROVIDER_SITE_OTHER): Payer: Medicare HMO | Admitting: Family Medicine

## 2022-01-22 VITALS — BP 116/60 | HR 60 | Temp 97.6°F | Ht 68.5 in | Wt 142.1 lb

## 2022-01-22 DIAGNOSIS — E785 Hyperlipidemia, unspecified: Secondary | ICD-10-CM

## 2022-01-22 DIAGNOSIS — M48061 Spinal stenosis, lumbar region without neurogenic claudication: Secondary | ICD-10-CM

## 2022-01-22 DIAGNOSIS — I10 Essential (primary) hypertension: Secondary | ICD-10-CM | POA: Diagnosis not present

## 2022-01-22 DIAGNOSIS — I714 Abdominal aortic aneurysm, without rupture, unspecified: Secondary | ICD-10-CM | POA: Diagnosis not present

## 2022-01-22 NOTE — Progress Notes (Signed)
Established Patient Office Visit  Subjective:  Patient ID: [name], male    DOB: 1936/11/08  Age: 85 y.o. MRN: [id number]  CC:  Chief Complaint  Patient presents with   Results    Patient states he had a [employer] screening test which revealed his cholesterol was elevated (see attached copy)    HPI [name] presents for medical follow-up.  He brings in copy of the [employer] screening test he had done recently.  No evidence for atrial fibrillation.  He had apparently carotid Dopplers which showed some mild to moderate plaque bilaterally but no significant obstruction.  He already gets abdominal aortic aneurysm screen through vascular and has appointment coming up in 2 days with them.  He does have history of hyperlipidemia and takes Lipitor.  Lipids were well controlled by labs back in August.  Quit smoking 20 years ago.  No history of diabetes.  Remains on aspirin per cardiology.  He had several questions regarding reducing plaque burden and atherosclerosis generally.  His blood pressures been extremely well controlled.  No recent dizziness.  He is to walk 30 to 40 minutes daily for exercise but has been very limited because of his back issues.  He had MRI back in November and these were again reviewed.  Severe spinal stenosis L3-4.  He has pain with minimal ambulation with some radiation into both buttocks.  Has been consistently doing some home exercises.  No progressive weakness.  Ambulating without assistance still without too much difficulty.  Past Medical History:  Diagnosis Date   AAA (abdominal aortic aneurysm)    AAA (abdominal aortic aneurysm)    4-4 cm intrarenal aaa per US aorta 03-28-2019 epic   Benign positional vertigo    Bladder cancer (HCC)    Bladder neck contracture    Coronary artery disease CARDIOLOGIST--  DR Acie Fredrickson   ED (erectile dysfunction)    GERD (gastroesophageal reflux disease)    H/O hiatal hernia    History of anal fissures    History of  colon polyps    History of radiation therapy 08/24/12-10/22/12   prostate 6600cGy/33 sessions--  for biochemical recurrence   Hyperlipidemia    Hypertension    OA (osteoarthritis)    Peripheral vascular disease ([place])    Recurrent prostate adenocarcinoma ([place]) first dx 09/2005--  radial prostatectomy-- gleason 4+3=7, pT2c, negative margin   04/2012--  BIOCHEMICAL  RECURRENCE to 0.33 /  SALVAGE IMRT SEPT to  NOV 2013   S/P CABG x 2    09/  2014   S/P dilatation of esophageal stricture    1992   Sciatica    both hips at times mostly left hip   Sigmoid diverticulosis    SUI (stress urinary incontinence), male    Urethral stricture    Wears glasses     Past Surgical History:  Procedure Laterality Date   CARDIAC CATHETERIZATION  08-27-2013  DR Martinique   CRITICAL OSTIAL LM STENOSIS/  diffuse disease LAD 40-50%  &   pLCx 30-40%/   CARDIOVASCULAR STRESS TEST  08-18-2013  DR NASHER   SMALL/ MEDIUM AREA MILD SCAR INFEROSEPTAL WALL/ NO ISCHEMIA/  LOW RISK SCAN/  EF 62%/  LV WALL MOTION WITH SLIGHT DECREASED OF THE SEPTUM   CORONARY ANGIOPLASTY  1991   PTCA  of LEFT CFX   CORONARY ARTERY BYPASS GRAFT N/A 08/27/2013   Procedure: CORONARY ARTERY BYPASS GRAFTING (CABG) times two using left internal mammary and right saphenous vein using endoscope.;  Surgeon:  Melrose Nakayama, MD;  Location: [place];  Service: Open Heart Surgery;  Laterality: N/A;   CYSTOSCOPY WITH URETHRAL DILATATION N/A 01/26/2014   Procedure: CYSTOSCOPY WITH URETHRAL DILATATION, BLADDER BIOPSY, FOLEY CATHETER;  Surgeon: Molli Hazard, MD;  Location: [facility];  Service: Urology;  Laterality: N/A;   INGUINAL HERNIA REPAIR Bilateral 1994   KNEE ARTHROSCOPY Right yrs ago   LEFT HEART CATHETERIZATION WITH CORONARY ANGIOGRAM N/A 08/27/2013   Procedure: LEFT HEART CATHETERIZATION WITH CORONARY ANGIOGRAM;  Surgeon: Peter M Martinique, MD;  [facility]: [facility] LAB;  Service: Cardiovascular;  Laterality: N/A;   LUMBAR DISC SURGERY  1980'S    RADICAL RETROPUBIC PROSTATECTOMY W/ BILATERAL PELVIC NODE DISSECTION  11-18-2005   TRANSURETHRAL INCISION OF PROSTATE N/A 11/01/2020   Procedure: TRANSURETHRAL INCISION OF THE BLADDER NECK;  Surgeon: Ardis Hughs, MD;  Location: [facility];  Service: Urology;  Laterality: N/A;   URETHROTOMY N/A 05/30/2014   Procedure: CYSTOSCOPY  BALLOON DILATION AND DIRECT VISION INTERNAL URETHROTOMY;  Surgeon: Sharyn Creamer, MD;  Location: [facility];  Service: Urology;  Laterality: N/A;    Family History  Problem Relation Age of Onset   Cancer Father        prostate died 46 something   Cancer Brother        prostate cancer   Arthritis Other    Hyperlipidemia Other    Hypertension Other     Social History   Socioeconomic History   Marital status: Single    Spouse name: Not on file   Number of children: 2   Years of education: 13   Highest education level: Not on file  Occupational History   Occupation: Retired    Fish farm manager: RETIRED  Tobacco Use   Smoking status: Former    Packs/day: 2.00    Years: 10.00    Pack years: 20.00    Types: Cigarettes    Quit date: 12/03/1994    Years since quitting: 27.1   Smokeless tobacco: Never  Vaping Use   Vaping Use: Never used  Substance and Sexual Activity   Alcohol use: Yes    Comment: occasionally   Drug use: No   Sexual activity: Not on file  Other Topics Concern   Not on file  Social History Narrative   Lives alone   Caffeine use: Coffee- 3 cups daily   Soda sometimes.    Social Determinants of Health   Financial Resource Strain: Low Risk    Difficulty of Paying Living Expenses: Not hard at all  Food Insecurity: No Food Insecurity   Worried About Charity fundraiser in the Last Year: Never true   [place] in the Last Year: Never true  Transportation Needs: No Transportation Needs   Lack of Transportation (Medical): No   Lack of Transportation (Non-Medical): No  Physical Activity:  Insufficiently Active   Days of Exercise per Week: 3 days   Minutes of Exercise per Session: 30 min  Stress: Stress Concern Present   Feeling of Stress : To some extent  Social Connections: Socially Isolated   Frequency of Communication with Friends and Family: Twice a week   Frequency of Social Gatherings with Friends and Family: Never   Attends Religious Services: Never   Marine scientist or Organizations: No   Attends Archivist Meetings: Never   Marital Status: Widowed  Intimate Partner Violence: Not At Risk   Fear of Current or Ex-Partner: No  Emotionally Abused: No   Physically Abused: No   Sexually Abused: No    Outpatient Medications Prior to Visit  Medication Sig Dispense Refill   acetaminophen (TYLENOL) 500 MG tablet Take 500 mg by mouth every 6 (six) hours as needed.     aspirin 81 MG EC tablet Take 1 tablet (81 mg total) by mouth daily.     atorvastatin (LIPITOR) 40 MG tablet TAKE 1 TABLET BY MOUTH EVERY DAY 90 tablet 3   carvedilol (COREG) 12.5 MG tablet TAKE 1 TABLET BY MOUTH TWICE A DAY 180 tablet 3   Cyanocobalamin (VITAMIN B12) 1000 MCG TBCR Take 100 mcg by mouth daily.     famotidine (PEPCID) 20 MG tablet Take 20 mg by mouth as needed for heartburn or indigestion.     furosemide (LASIX) 20 MG tablet TAKE 1 TABLET (20 MG TOTAL) BY MOUTH 3 (THREE) TIMES A WEEK. ON MONDAY, WEDNESDAY, FRIDAY 36 tablet 3   latanoprost (XALATAN) 0.005 % ophthalmic solution Place 1 drop into both eyes at bedtime.      losartan (COZAAR) 50 MG tablet TAKE 1 TABLET BY MOUTH EVERY DAY 90 tablet 2   Multiple Vitamin (MULTIVITAMIN WITH MINERALS) TABS tablet Take 1 tablet by mouth daily.     nitroGLYCERIN (NITROSTAT) 0.4 MG SL tablet PLACE 1 TABLET UNDER THE TONUE EVERY 5 MINUTES AS NEEDED FOR CHEST PAIN. 75 tablet 3   polyethylene glycol (MIRALAX / GLYCOLAX) packet Take 17 g by mouth daily as needed (for constipation).     sertraline (ZOLOFT) 50 MG tablet TAKE 1 TABLET BY MOUTH  EVERY DAY 90 tablet 3   No facility-administered medications prior to visit.    Allergies  Allergen Reactions   Bee Venom Other (See Comments)    Reaction swelling   Amoxicillin Rash   Hydrocodone Other (See Comments)    REACTION: disorientation    ROS Review of Systems  Constitutional:  Negative for fatigue.  Eyes:  Negative for visual disturbance.  Respiratory:  Negative for cough, chest tightness and shortness of breath.   Cardiovascular:  Negative for chest pain, palpitations and leg swelling.  Endocrine: Negative for polydipsia and polyuria.  Musculoskeletal:  Positive for back pain.  Neurological:  Negative for dizziness, syncope, weakness, light-headedness and headaches.     Objective:    Physical Exam Constitutional:      Appearance: He is well-developed.  HENT:     Right Ear: External ear normal.     Left Ear: External ear normal.  Eyes:     Pupils: Pupils are equal, round, and reactive to light.  Neck:     Thyroid: No thyromegaly.  Cardiovascular:     Rate and Rhythm: Normal rate and regular rhythm.  Pulmonary:     Effort: Pulmonary effort is normal. No respiratory distress.     Breath sounds: Normal breath sounds. No wheezing or rales.  Musculoskeletal:     Cervical back: Neck supple.     Right lower leg: No edema.     Left lower leg: No edema.  Neurological:     Mental Status: He is alert and oriented to person, place, and time.    BP 116/60 (BP Location: Left Arm, Patient Position: Sitting, Cuff Size: Normal)    Pulse 60    Temp 97.6 F (36.4 C) (Oral)    Ht 5' 8.5" (1.74 m)    Wt 142 lb 1.6 oz (64.5 kg)    SpO2 99%    BMI 21.29 kg/m  Wt  Readings from Last 3 Encounters:  01/22/22 142 lb 1.6 oz (64.5 kg)  09/10/21 135 lb 11.2 oz (61.6 kg)  08/08/21 131 lb 6.4 oz (59.6 kg)     There are no preventive care reminders to display for this patient.  There are no preventive care reminders to display for this patient.  Lab Results  Component Value  Date   TSH 1.45 03/28/2021   Lab Results  Component Value Date   WBC 5.5 03/15/2021   HGB 11.9 (L) 03/15/2021   HCT 36.3 (L) 03/15/2021   MCV 109.3 (H) 03/15/2021   PLT 139 (L) 03/15/2021   Lab Results  Component Value Date   NA 141 07/31/2021   K 4.7 07/31/2021   CO2 27 07/31/2021   GLUCOSE 80 07/31/2021   BUN 29 (H) 07/31/2021   CREATININE 1.67 (H) 07/31/2021   BILITOT 0.6 03/15/2021   ALKPHOS 53 03/15/2021   AST 27 03/15/2021   ALT 17 07/31/2021   PROT 6.2 (L) 03/15/2021   ALBUMIN 3.7 03/15/2021   CALCIUM 9.7 07/31/2021   ANIONGAP 5 03/15/2021   EGFR 40 (L) 07/31/2021   GFR 47.34 (L) 02/01/2020   Lab Results  Component Value Date   CHOL 107 07/31/2021   Lab Results  Component Value Date   HDL 40 07/31/2021   Lab Results  Component Value Date   LDLCALC 50 07/31/2021   Lab Results  Component Value Date   TRIG 89 07/31/2021   Lab Results  Component Value Date   CHOLHDL 2.7 07/31/2021   No results found for: HGBA1C    Assessment & Plan:   #1 history of CAD.  No recent chest pains.  Patient and lipids back in August with stable LDL cholesterol which is at goal.  Continue high-dose statin therapy.  #2 history of abdominal aortic aneurysm.  Patient is scheduled for follow-up imaging in 2 days.  He was reminded of this.  #3 lumbar stenosis.  He has severe stenosis L3-L4.  Pain reasonably well controlled though unfortunately this has limited his exercise.  Avoid nonsteroidals.  Tylenol as needed.  Be in touch if this is not adequate  #4 hypertension stable and well-controlled  No orders of the defined types were placed in this encounter.   Follow-up: No follow-ups on file.    [name], MD

## 2022-01-23 ENCOUNTER — Other Ambulatory Visit: Payer: Self-pay | Admitting: Vascular Surgery

## 2022-01-23 DIAGNOSIS — I739 Peripheral vascular disease, unspecified: Secondary | ICD-10-CM

## 2022-01-24 ENCOUNTER — Other Ambulatory Visit: Payer: Self-pay

## 2022-01-24 ENCOUNTER — Ambulatory Visit (INDEPENDENT_AMBULATORY_CARE_PROVIDER_SITE_OTHER)
Admission: RE | Admit: 2022-01-24 | Discharge: 2022-01-24 | Disposition: A | Discharge status: Home / Self Care | Payer: Medicare HMO | Source: Ambulatory Visit | Attending: Vascular Surgery | Admitting: Vascular Surgery

## 2022-01-24 ENCOUNTER — Ambulatory Visit (HOSPITAL_COMMUNITY)
Admission: RE | Admit: 2022-01-24 | Discharge: 2022-01-24 | Disposition: A | Discharge status: Home / Self Care | Payer: Medicare HMO | Source: Ambulatory Visit | Attending: Vascular Surgery | Admitting: Vascular Surgery

## 2022-01-24 ENCOUNTER — Ambulatory Visit: Payer: Medicare HMO | Admitting: Vascular Surgery

## 2022-01-24 DIAGNOSIS — I739 Peripheral vascular disease, unspecified: Secondary | ICD-10-CM | POA: Diagnosis not present

## 2022-01-24 DIAGNOSIS — I714 Abdominal aortic aneurysm, without rupture, unspecified: Secondary | ICD-10-CM

## 2022-01-31 ENCOUNTER — Ambulatory Visit: Payer: Medicare HMO | Admitting: Vascular Surgery

## 2022-01-31 ENCOUNTER — Encounter: Payer: Self-pay | Admitting: Vascular Surgery

## 2022-01-31 ENCOUNTER — Other Ambulatory Visit: Payer: Self-pay

## 2022-01-31 VITALS — BP 113/71 | HR 67 | Temp 98.0°F | Resp 20 | Ht 68.5 in | Wt 141.0 lb

## 2022-01-31 DIAGNOSIS — I739 Peripheral vascular disease, unspecified: Secondary | ICD-10-CM

## 2022-01-31 DIAGNOSIS — I7143 Infrarenal abdominal aortic aneurysm, without rupture: Secondary | ICD-10-CM

## 2022-01-31 NOTE — Progress Notes (Signed)
? ? ?REASON FOR VISIT:  ? ?Follow-up of abdominal aortic aneurysm and right common iliac artery stenosis ? ?MEDICAL ISSUES:  ? ?ABDOMINAL AORTIC ANEURYSM: His aneurysm is stable in size at 4.5 cm.  He is asymptomatic.  I think we can continue his follow-up at 9 months and I have ordered a follow-up duplex in 9 months.  I will see him back at that time.  His blood pressure is under good control and he is not a smoker. ? ?RIGHT COMMON ILIAC ARTERY STENOSIS: Based on a previous duplex scan he does have a history of a right common iliac artery stenosis.  He does have some right thigh claudication but also has significant back pain and experiences significant symptoms in the right thigh when he is standing.  Thus some of the symptoms can be related to his back.  I have encouraged him to walk is much as possible.  I will get follow-up ABIs in 9 months when he returns and I have ordered a Doppler study for that time. ? ?HPI:  ? ?[name] is a pleasant 86 y.o. male who I last saw on 04/04/2021.  I been following him with a small infrarenal abdominal aortic aneurysm.  At that time it was 4.5 cm in maximum diameter.  He was not a smoker.  His blood pressure was under good control.  I recommended a 90-month follow-up study.  Of note he was also noted to have by duplex a right common iliac artery stenosis.  He had a slightly diminished right femoral pulse.  I felt that we would only consider arteriography if his symptoms progressed significantly.  I did order ABIs in 9 months.  He does have stage III chronic kidney disease and therefore we will try to follow his aneurysm with ultrasound only. ? ?Since I saw him last, he denies any significant abdominal pain.  He does have chronic low back pain.  He complains of some pain in the right thigh that occurs simply with standing and I think this could be related to his back.  However some of this could be right thigh claudication given his history of a right common iliac artery  stenosis.  It is difficult for him to discern if walking makes the symptoms worse.  He denies any history of rest pain or nonhealing ulcers. ? ?He is on aspirin and is on a statin.  He is not a smoker. ? ?Past Medical History:  ?Diagnosis Date  ? AAA (abdominal aortic aneurysm)   ? AAA (abdominal aortic aneurysm)   ? 4-4 cm intrarenal aaa per US aorta 03-28-2019 epic  ? Benign positional vertigo   ? Bladder cancer ([place])   ? Bladder neck contracture   ? Coronary artery disease CARDIOLOGIST--  DR Acie Fredrickson  ? ED (erectile dysfunction)   ? GERD (gastroesophageal reflux disease)   ? H/O hiatal hernia   ? History of anal fissures   ? History of colon polyps   ? History of radiation therapy 08/24/12-10/22/12  ? prostate 6600cGy/33 sessions--  for biochemical recurrence  ? Hyperlipidemia   ? Hypertension   ? OA (osteoarthritis)   ? Peripheral vascular disease ([place])   ? Recurrent prostate adenocarcinoma ([place]) first dx 09/2005--  radial prostatectomy-- gleason 4+3=7, pT2c, negative margin  ? 04/2012--  BIOCHEMICAL  RECURRENCE to 0.33 /  SALVAGE IMRT SEPT to  NOV 2013  ? S/P CABG x 2   ? 09/  2014  ? S/P dilatation of esophageal stricture   ?  1992  ? Sciatica   ? both hips at times mostly left hip  ? Sigmoid diverticulosis   ? SUI (stress urinary incontinence), male   ? Urethral stricture   ? Wears glasses   ? ? ?Family History  ?Problem Relation Age of Onset  ? Cancer Father   ?     prostate died 86 something  ? Cancer Brother   ?     prostate cancer  ? Arthritis Other   ? Hyperlipidemia Other   ? Hypertension Other   ? ? ?SOCIAL HISTORY: ?Social History  ? ?Tobacco Use  ? Smoking status: Former  ?  Packs/day: 2.00  ?  Years: 10.00  ?  Pack years: 20.00  ?  Types: Cigarettes  ?  Quit date: 12/03/1994  ?  Years since quitting: 27.1  ? Smokeless tobacco: Never  ?Substance Use Topics  ? Alcohol use: Yes  ?  Comment: occasionally  ? ? ?Allergies  ?Allergen Reactions  ? Bee Venom Other (See Comments)  ?  Reaction swelling  ? Amoxicillin  Rash  ? Hydrocodone Other (See Comments)  ?  REACTION: disorientation  ? ? ?Current Outpatient Medications  ?Medication Sig Dispense Refill  ? acetaminophen (TYLENOL) 500 MG tablet Take 500 mg by mouth every 6 (six) hours as needed.    ? aspirin 81 MG EC tablet Take 1 tablet (81 mg total) by mouth daily.    ? atorvastatin (LIPITOR) 40 MG tablet TAKE 1 TABLET BY MOUTH EVERY DAY 90 tablet 3  ? carvedilol (COREG) 12.5 MG tablet TAKE 1 TABLET BY MOUTH TWICE A DAY 180 tablet 3  ? Cyanocobalamin (VITAMIN B12) 1000 MCG TBCR Take 100 mcg by mouth daily.    ? famotidine (PEPCID) 20 MG tablet Take 20 mg by mouth as needed for heartburn or indigestion.    ? furosemide (LASIX) 20 MG tablet TAKE 1 TABLET (20 MG TOTAL) BY MOUTH 3 (THREE) TIMES A WEEK. ON MONDAY, WEDNESDAY, FRIDAY 36 tablet 3  ? latanoprost (XALATAN) 0.005 % ophthalmic solution Place 1 drop into both eyes at bedtime.     ? losartan (COZAAR) 50 MG tablet TAKE 1 TABLET BY MOUTH EVERY DAY 90 tablet 2  ? Multiple Vitamin (MULTIVITAMIN WITH MINERALS) TABS tablet Take 1 tablet by mouth daily.    ? nitroGLYCERIN (NITROSTAT) 0.4 MG SL tablet PLACE 1 TABLET UNDER THE TONUE EVERY 5 MINUTES AS NEEDED FOR CHEST PAIN. 75 tablet 3  ? polyethylene glycol (MIRALAX / GLYCOLAX) packet Take 17 g by mouth daily as needed (for constipation).    ? sertraline (ZOLOFT) 50 MG tablet TAKE 1 TABLET BY MOUTH EVERY DAY 90 tablet 3  ? ?No current facility-administered medications for this visit.  ? ? ?REVIEW OF SYSTEMS:  ?[X]  denotes positive finding, [ ]  denotes negative finding ?Cardiac  Comments:  ?Chest pain or chest pressure:    ?Shortness of breath upon exertion:    ?Short of breath when lying flat:    ?Irregular heart rhythm:    ?    ?Vascular    ?Pain in calf, thigh, or hip brought on by ambulation: x   ?Pain in feet at night that wakes you up from your sleep:     ?Blood clot in your veins:    ?Leg swelling:     ?    ?Pulmonary    ?Oxygen at home:    ?Productive cough:     ?Wheezing:      ?    ?Neurologic    ?  Sudden weakness in arms or legs:     ?Sudden numbness in arms or legs:     ?Sudden onset of difficulty speaking or slurred speech:    ?Temporary loss of vision in one eye:     ?Problems with dizziness:     ?    ?Gastrointestinal    ?Blood in stool:     ?Vomited blood:     ?    ?Genitourinary    ?Burning when urinating:     ?Blood in urine:    ?    ?Psychiatric    ?Major depression:     ?    ?Hematologic    ?Bleeding problems:    ?Problems with blood clotting too easily:    ?    ?Skin    ?Rashes or ulcers:    ?    ?Constitutional    ?Fever or chills:    ? ?PHYSICAL EXAM:  ? ?There were no vitals filed for this visit. ? ?GENERAL: The patient is a well-nourished male, in no acute distress. The vital signs are documented above. ?CARDIAC: There is a regular rate and rhythm.  ?VASCULAR: I do not detect carotid bruits. ?He has a diminished right femoral pulse and a normal left femoral pulse. ?I cannot palpate pedal pulses. ?He has no significant lower extremity swelling. ?PULMONARY: There is good air exchange bilaterally without wheezing or rales. ?ABDOMEN: Soft and non-tender with normal pitched bowel sounds.  His aneurysm is palpable and nontender. ?MUSCULOSKELETAL: There are no major deformities or cyanosis. ?NEUROLOGIC: No focal weakness or paresthesias are detected. ?SKIN: There are no ulcers or rashes noted. ?PSYCHIATRIC: The patient has a normal affect. ? ?DATA:   ? ?ARTERIAL DOPPLER STUDY: I have reviewed the arterial Doppler study that was done on 02/21/2022. ? ?On the right side there is a biphasic dorsalis pedis and posterior tibial signal.  ABI is 82%.  Toe pressure is 91 mmHg. ? ?On the left side there is a triphasic dorsalis pedis and posterior tibial signal.  ABI is 100%.  Toe pressure is 93 mmHg. ? ?DUPLEX ABDOMINAL AORTA: I have reviewed the duplex of the abdominal aorta that was done on 01/24/2022.  The maximum diameter of his infrarenal aorta is 4.5 cm.  The right common iliac artery  measures 1.4 cm in maximum diameter.  The left common iliac artery measures 1.2 cm in maximum diameter. ? ?Deitra Mayo ?[facility] ?Office [phone] ?

## 2022-02-01 ENCOUNTER — Other Ambulatory Visit: Payer: Self-pay | Admitting: *Deleted

## 2022-02-01 DIAGNOSIS — I739 Peripheral vascular disease, unspecified: Secondary | ICD-10-CM

## 2022-02-01 DIAGNOSIS — I7143 Infrarenal abdominal aortic aneurysm, without rupture: Secondary | ICD-10-CM

## 2022-03-20 ENCOUNTER — Telehealth: Payer: Medicare HMO

## 2022-04-15 ENCOUNTER — Telehealth: Payer: Self-pay | Admitting: Pharmacist

## 2022-04-15 NOTE — Chronic Care Management (AMB) (Signed)
    Chronic Care Management Pharmacy Assistant   Name: [name]  MRN: [id number] DOB: September 27, 1936  04/16/2022 APPOINTMENT REMINDER   Called [name], No answer, left message of appointment on 04/16/2022 at 1:00 via telephone visit with Jeni Salles, Pharm D. Notified to have all medications, supplements, blood pressure and/or blood sugar logs available during appointment and to return call if need to reschedule.  Care Gaps: AWV - scheduled for 05/14/2022  Last BP - 113/71 on 01/31/2022   Star Rating Drugs: Atorvastatin '40mg'$  - last filled 03/15/2022 at [facility] verified with pharmacist Losartan '50mg'$  - last filled 01/17/2022 at [facility]   Any gaps in medications fill history? No  Gennie Alma [facility]  Catering manager [phone]

## 2022-04-16 ENCOUNTER — Telehealth: Payer: Self-pay | Admitting: Family Medicine

## 2022-04-16 ENCOUNTER — Ambulatory Visit (INDEPENDENT_AMBULATORY_CARE_PROVIDER_SITE_OTHER): Payer: Medicare HMO | Admitting: Pharmacist

## 2022-04-16 VITALS — BP 118/58

## 2022-04-16 DIAGNOSIS — E785 Hyperlipidemia, unspecified: Secondary | ICD-10-CM

## 2022-04-16 DIAGNOSIS — I10 Essential (primary) hypertension: Secondary | ICD-10-CM

## 2022-04-16 NOTE — Telephone Encounter (Signed)
-----   Message from Viona Gilmore, [facility] sent at 04/15/2022  3:13 PM EDT ----- ?Regarding: CCM referral ?Hi again, ? ?This is another patient that I am seeing and needs a CCM referral. Can you please place that? Again the code is [id number]. ? ?Thank you! ?Maddie ? ?

## 2022-04-16 NOTE — Patient Instructions (Addendum)
Try to buy an arm blood pressure cuff - look for [employer] brand  Maddie Jeni Salles, PharmD, [facility] [place]  Visit Information   Goals Addressed   None    Patient Care Plan: CCM Pharmacy Care Plan     Problem Identified: Problem: Hypertension, Hyperlipidemia, Coronary Artery Disease, GERD, Chronic Kidney Disease, Depression, Osteoarthritis, and Vitamin B12 deficiency      Long-Range Goal: Patient-Specific Goal   Start Date: 09/19/2021  Expected End Date: 09/19/2022  Recent Progress: On track  Priority: High  Note:   Current Barriers:  Unable to independently monitor therapeutic efficacy  Pharmacist Clinical Goal(s):  Patient will achieve adherence to monitoring guidelines and medication adherence to achieve therapeutic efficacy through collaboration with PharmD and provider.   Interventions: 1:1 collaboration with [name], MD regarding development and update of comprehensive plan of care as evidenced by provider attestation and co-signature Inter-disciplinary care team collaboration (see longitudinal plan of care) Comprehensive medication review performed; medication list updated in electronic medical record  Hypertension (BP goal <130/80) -Controlled -Current treatment: Losartan 50 mg  1 tablet daily - in AM - Appropriate, Query effective, Safe, Accessible Carvedilol 12.5 mg 1 tablet twice daily - Appropriate, Query effective, Safe, Accessible -Medications previously tried: n/a  -Current home readings: not rechecking (arm cuff - not had for a while) -Current dietary habits: limits salt intake; uses it sometimes (doesn't look at package labels); canned foods and frozen vegetables  -Current exercise habits: not able to with back pain -Denies hypotensive/hypertensive symptoms -Educated on BP goals and benefits of medications for prevention of heart attack, stroke and kidney damage; Exercise goal of 150  minutes per week; Importance of home blood pressure monitoring; Proper BP monitoring technique; -Counseled to monitor BP at home weekly, document, and provide log at future appointments -Counseled on diet and exercise extensively Recommended to continue current medication  Hyperlipidemia: (LDL goal < 70) -Controlled -Current treatment: Atorvastatin 40 mg 1 tablet daily - Appropriate, Effective, Safe, Accessible -Medications previously tried: none  -Current dietary patterns: doesn't eat fried foods very often; not eating much meat, chicken, fish some -Current exercise habits: not able to do much with back pain -Educated on Cholesterol goals;  Benefits of statin for ASCVD risk reduction; Importance of limiting foods high in cholesterol; -Counseled on diet and exercise extensively Recommended to continue current medication  Heart Failure (Goal: manage symptoms and prevent exacerbations) -Controlled -Last ejection fraction:  45-50% (Date: 09/01/18) -HF type: Systolic -NYHA Class: II (slight limitation of activity) -AHA HF Stage: C (Heart disease and symptoms present) -Current treatment: Losartan 50 mg  1 tablet daily - Appropriate, Effective, Safe, Accessible Carvedilol 12.5 mg 1 tablet twice daily - Appropriate, Effective, Safe, Accessible Furosemide  20 mg 1 tablet three times a week - Appropriate, Effective, Safe, Accessible -Medications previously tried: n/a  -Current home BP/HR readings: refer to above -Current dietary habits: tries to limit salt intake -Current exercise habits: unable to do much -Educated on Benefits of medications for managing symptoms and prolonging life Importance of weighing daily; if you gain more than 3 pounds in one day or 5 pounds in one week, call cardiology. Importance of blood pressure control -Counseled on diet and exercise extensively Recommended to continue current medication   Depression (Goal: minimize symptoms) -Not ideally  controlled -Current treatment: Sertraline 50 mg 1 tablet daily - Appropriate, Query effective, Safe, Accessible -Medications previously tried/failed: n/a -PHQ9: 2  -GAD7: n/a -Educated on Benefits of medication for  symptom control Benefits of cognitive-behavioral therapy with or without medication -Recommended to continue current medication  CAD (Goal: prevent heart events) -Controlled -Current treatment  Aspirin 81 mg 1 tablet daily - Appropriate, Effective, Safe, Accessible Atorvastatin 40 mg 1 tablet daily - Appropriate, Effective, Safe, Accessible Nitroglycerin 0.4 mg 1 tablet as needed - Appropriate, Effective, Safe, Accessible -Medications previously tried: none  -Recommended to continue current medication Counseled on monitoring for bleeding and avoidance of NSAIDs.  GERD (Goal: minimize symptoms) -Controlled -Current treatment  Famotidine 20 mg 1 tablet as needed - Appropriate, Effective, Safe, Accessible -Medications previously tried: n/a  -Counseled on non-pharmacologic management of symptoms such as elevating the head of your bed, avoiding eating 2-3 hours before bed, avoiding triggering foods such as acidic, spicy, or fatty foods, eating smaller meals, and wearing clothes that are loose around the waist  Health Maintenance -Vaccine gaps: none -Current therapy:  Miralax as needed Vitamin B12 100 mcg daily Multivitamin 1 tablet daily Latanoprost 0.05% 1 drop in both eyes at bedtime -Educated on Cost vs benefit of each product must be carefully weighed by individual consumer -Patient is satisfied with current therapy and denies issues -Recommended to continue current medication  Patient Goals/Self-Care Activities Patient will:  - take medications as prescribed as evidenced by patient report and record review check blood pressure weekly, document, and provide at future appointments target a minimum of 150 minutes of moderate intensity exercise weekly  Follow Up  Plan: Telephone follow up appointment with care management team member scheduled for: 6 months       Patient verbalizes understanding of instructions and care plan provided today and agrees to view in [place]. Active MyChart status and patient understanding of how to access instructions and care plan via MyChart confirmed with patient.    The pharmacy team will reach out to the patient again over the next 60 days.   Viona Gilmore, [facility]

## 2022-04-16 NOTE — Progress Notes (Signed)
Chronic Care Management Pharmacy Note  04/26/2022 Name:  CIRILO CANNER MRN:  [id number] DOB:  28-Jun-1936  Summary: BP mostly at goal < 140/90 LDL at goal < 70  Recommendations/Changes made from today's visit: -Recommended considering counseling -Recommended restarting routine BP monitoring at home  Plan: BP assessment in 2 months Follow up in 6 months  Subjective: BARLOW HARRISON is an 86 y.o. year old male who is a primary patient of Burchette, Alinda Sierras, MD.  The CCM team was consulted for assistance with disease management and care coordination needs.    Engaged with patient by telephone for follow up visit in response to provider referral for pharmacy case management and/or care coordination services.   Consent to Services:  The patient was given information about Chronic Care Management services, agreed to services, and gave verbal consent prior to initiation of services.  Please see initial visit note for detailed documentation.   Patient Care Team: [name], MD as PCP - General Nahser, Wonda Cheng, MD as PCP - Cardiology (Cardiology) Sharyne Peach, MD as Consulting Physician (Ophthalmology) Viona Gilmore, [facility] as Pharmacist (Pharmacist)  Recent office visits: 01/22/22 [name], MD: Patient presented for AAA follow up and results for lifeline screening.    Recent consult visits: 01/31/22 Deitra Mayo, MD (vascular surgery): Patient presented for AAA (abdominal aortic aneurysm) without rupture follow up.  Plan for ABIs in 9 months.  01/03/22 Louis Meckel (urology): Patient presented for bladder neck obstruction and stress incontinence follow up. Unable to access notes.  11/08/21 Sharyne Peach (ophthalmology): Patient presented for open angle glaucoma follow up. Unable to access notes.  07/31/2021 Cleatrice Burke MD (cardiology) - Patient was seen for  Coronary artery disease involving native coronary artery of native heart without angina pectoris  +2 more issues. No medication changes. Follow up in 1 year.  Hospital visits: None in previous 6 months.   Objective:  Lab Results  Component Value Date   CREATININE 1.67 (H) 07/31/2021   BUN 29 (H) 07/31/2021   GFR 47.34 (L) 02/01/2020   GFRNONAA 39 (L) 03/15/2021   GFRAA 56 (L) 01/24/2021   NA 141 07/31/2021   K 4.7 07/31/2021   CALCIUM 9.7 07/31/2021   CO2 27 07/31/2021   GLUCOSE 80 07/31/2021    Lab Results  Component Value Date/Time   GFR 47.34 (L) 02/01/2020 12:00 PM   GFR 45.59 (L) 04/20/2019 08:43 AM    Last diabetic Eye exam: No results found for: HMDIABEYEEXA  Last diabetic Foot exam: No results found for: HMDIABFOOTEX   Lab Results  Component Value Date   CHOL 107 07/31/2021   HDL 40 07/31/2021   LDLCALC 50 07/31/2021   TRIG 89 07/31/2021   CHOLHDL 2.7 07/31/2021       Latest Ref Rng & Units 07/31/2021   10:44 AM 03/15/2021   10:34 AM 01/24/2021    7:32 AM  Hepatic Function  Total Protein 6.5 - 8.1 g/dL  6.2   6.6    Albumin 3.5 - 5.0 g/dL  3.7   4.2    AST 15 - 41 U/L  27   20    ALT 0 - 44 IU/L 17   27   21     Alk Phosphatase 38 - 126 U/L  53   81    Total Bilirubin 0.3 - 1.2 mg/dL  0.6   0.4    Bilirubin, Direct 0.00 - 0.40 mg/dL   0.14      Lab  Results  Component Value Date/Time   TSH 1.45 03/28/2021 10:41 AM   TSH 0.93 10/28/2012 11:51 AM       Latest Ref Rng & Units 03/15/2021   10:34 AM 03/06/2021   10:12 AM 11/01/2020   10:24 AM  CBC  WBC 4.0 - 10.5 K/uL 5.5   4.8     Hemoglobin 13.0 - 17.0 g/dL 11.9   11.8   12.9    Hematocrit 39.0 - 52.0 % 36.3   34.6   38.0    Platelets 150 - 400 K/uL 139   123.0       No results found for: VD25OH  Clinical ASCVD: Yes  The ASCVD Risk score (Arnett DK, et al., 2019) failed to calculate for the following reasons:   The 2019 ASCVD risk score is only valid for ages 34 to 37       01/22/2022    2:57 PM 09/19/2021    1:02 PM 05/08/2021   11:18 AM  Depression screen PHQ 2/9  Decreased Interest 0  1 0  Down, Depressed, Hopeless 0 0 1  PHQ - 2 Score 0 1 1  Altered sleeping 0 0   Tired, decreased energy 0 0   Change in appetite 0 1   Feeling bad or failure about yourself  0 0   Trouble concentrating 0 0   Moving slowly or fidgety/restless 0 0   Suicidal thoughts 0 0   PHQ-9 Score 0 2   Difficult doing work/chores  Not difficult at all       Social History   Tobacco Use  Smoking Status Former   Packs/day: 2.00   Years: 10.00   Pack years: 20.00   Types: Cigarettes   Quit date: 12/03/1994   Years since quitting: 27.4  Smokeless Tobacco Never   BP Readings from Last 3 Encounters:  04/16/22 (!) 118/58  01/31/22 113/71  01/22/22 116/60   Pulse Readings from Last 3 Encounters:  01/31/22 67  01/22/22 60  09/19/21 (!) 53   Wt Readings from Last 3 Encounters:  01/31/22 141 lb (64 kg)  01/22/22 142 lb 1.6 oz (64.5 kg)  09/10/21 135 lb 11.2 oz (61.6 kg)   BMI Readings from Last 3 Encounters:  01/31/22 21.13 kg/m  01/22/22 21.29 kg/m  09/10/21 20.33 kg/m    Assessment/Interventions: Review of patient past medical history, allergies, medications, health status, including review of consultants reports, laboratory and other test data, was performed as part of comprehensive evaluation and provision of chronic care management services.   SDOH:  (Social Determinants of Health) assessments and interventions performed: Yes   SDOH Screenings   Alcohol Screen: Not on file  Depression (PHQ2-9): Low Risk    PHQ-2 Score: 0  Financial Resource Strain: Low Risk    Difficulty of Paying Living Expenses: Not hard at all  Food Insecurity: No Food Insecurity   Worried About Charity fundraiser in the Last Year: Never true   Ran Out of Food in the Last Year: Never true  Housing: Low Risk    Last Housing Risk Score: 0  Physical Activity: Insufficiently Active   Days of Exercise per Week: 3 days   Minutes of Exercise per Session: 30 min  Social Connections: Socially Isolated    Frequency of Communication with Friends and Family: Twice a week   Frequency of Social Gatherings with Friends and Family: Never   Attends Religious Services: Never   Marine scientist or Organizations: No  Attends Archivist Meetings: Never   Marital Status: Widowed  Stress: Stress Concern Present   Feeling of Stress : To some extent  Tobacco Use: Medium Risk   Smoking Tobacco Use: Former   Smokeless Tobacco Use: Never   Passive Exposure: Not on file  Transportation Needs: No Transportation Needs   Lack of Transportation (Medical): No   Lack of Transportation (Non-Medical): No   Patient is not working or volunteering right now and he lives alone. Patient does enjoy doing jigsaw puzzles in his spare time. He has two daughters and one is in [place] and one in [place]. He cooks at home mostly and usually makes casseroles and made pumpkin pie last night but he somehow forgot the eggs in the recipe. He does not eat out often.  Patient is doing some back exercises every day for 30 minutes. He is not able to walk with back trouble as he was previously walking 30 mins on treadmill or use to go hiking but is unable to now.  Patient's biggest health concerns are with his back problems and he tries Tylenol some but it doesn't always help. He was on gabapentin for a little while but did not take it for very long as he did not noticed a difference in his pain and is not interested in restarting.   Patient denies any side effects from his current medications but is unsure of what all of his medications are for.  CCM Care Plan  Allergies  Allergen Reactions   Bee Venom Other (See Comments)    Reaction swelling   Amoxicillin Rash   Hydrocodone Other (See Comments)    REACTION: disorientation    Medications Reviewed Today     Reviewed by Viona Gilmore, [facility] (Pharmacist) on 04/16/22 at 1325  Med List Status: <None>   Medication Order Taking? Sig Documenting Provider Last  Dose Status Informant  acetaminophen (TYLENOL) 500 MG tablet [id number]  Take 500 mg by mouth every 6 (six) hours as needed. [provider]  Active   aspirin 81 MG EC tablet [id number] Yes Take 1 tablet (81 mg total) by mouth daily. Nahser, Wonda Cheng, MD Taking Active Self  atorvastatin (LIPITOR) 40 MG tablet [id number] Yes TAKE 1 TABLET BY MOUTH EVERY DAY Burchette, Alinda Sierras, MD Taking Active   carvedilol (COREG) 12.5 MG tablet [id number] Yes TAKE 1 TABLET BY MOUTH TWICE A DAY Nahser, Wonda Cheng, MD Taking Active   Cyanocobalamin (VITAMIN B12) 1000 MCG TBCR [id number] Yes Take 100 mcg by mouth daily. [provider] Taking Active   famotidine (PEPCID) 20 MG tablet [id number] Yes Take 20 mg by mouth as needed for heartburn or indigestion. [provider] Taking Active   furosemide (LASIX) 20 MG tablet [id number] Yes TAKE 1 TABLET (20 MG TOTAL) BY MOUTH 3 (THREE) TIMES A WEEK. ON MONDAY, WEDNESDAY, FRIDAY Nahser, Wonda Cheng, MD Taking Active   latanoprost (XALATAN) 0.005 % ophthalmic solution [id number] Yes Place 1 drop into both eyes at bedtime.  [provider] Taking Active Self  losartan (COZAAR) 50 MG tablet [id number] Yes TAKE 1 TABLET BY MOUTH EVERY DAY Nahser, Wonda Cheng, MD Taking Active   Multiple Vitamin (MULTIVITAMIN WITH MINERALS) TABS tablet [id number] Yes Take 1 tablet by mouth daily. [provider] Taking Active Self  nitroGLYCERIN (NITROSTAT) 0.4 MG SL tablet [id number] Yes PLACE 1 TABLET UNDER THE TONUE EVERY 5 MINUTES AS NEEDED FOR CHEST PAIN. Nahser, Wonda Cheng, MD Taking Active   polyethylene glycol [facility] /  GLYCOLAX) packet [id number] Yes Take 17 g by mouth daily as needed (for constipation). [provider] Taking Active            Med Note Ovidio Kin Dec 20, 2020 11:05 AM)    sertraline (ZOLOFT) 50 MG tablet [id number] Yes TAKE 1 TABLET BY MOUTH EVERY DAY [name], MD Taking Active             Patient Active  Problem List   Diagnosis Date Noted   Lumbar stenosis 01/22/2022   CKD (chronic kidney disease) stage 3, GFR 30-59 ml/min ([place]) 09/28/2019   Visual disturbance 01/07/2017   Adjustment disorder with depressed mood 01/24/2015   S/P CABG x 2 10/12/2013   Vitamin B 12 deficiency 01/19/2013   Abdominal aortic aneurysm ([place]) 06/02/2012   ACUTE SINUSITIS, UNSPECIFIED 11/27/2010   Hyperlipidemia 04/06/2010   BENIGN POSITIONAL VERTIGO 04/06/2010   Essential hypertension 04/06/2010   CAD (coronary artery disease) 04/06/2010   GERD 04/06/2010   SKIN LESION 04/06/2010   OSTEOARTHRITIS, GENERALIZED 04/06/2010   Prostate cancer ([place]) 12/03/2004    Immunization History  Administered Date(s) Administered   Fluad Quad(high Dose 65+) 08/03/2020, 08/08/2021   Influenza Split 08/25/2012   Influenza Whole 08/16/2010   Influenza, High Dose Seasonal PF 08/12/2016, 08/20/2017   Influenza,inj,Quad PF,6+ Mos 08/24/2013, 08/04/2014, 08/10/2015   Influenza,inj,quad, With Preservative 11/01/2017   Influenza-Unspecified 08/11/2018   PFIZER Comirnaty(Gray Top)Covid-19 Tri-[place] Vaccine 03/06/2021   PFIZER(Purple Top)SARS-COV-2 Vaccination 01/06/2020, 01/31/2020, 09/05/2020   Pfizer Covid-19 Vaccine Bivalent Booster 75yr & up 08/23/2021   Pneumococcal Conjugate-13 08/04/2014   Pneumococcal Polysaccharide-23 12/05/2004   Td 11/08/2010, 05/21/2019   Zoster Recombinat (Shingrix) 10/25/2017, 01/29/2018   Increase sertraline?  BP  -dizziness/lightheadedness  -no pattern with it  -  BP cuff -   BP Readings from Last 3 Encounters:  04/16/22 (!) 118/58  01/31/22 113/71  01/22/22 116/60    Conditions to be addressed/monitored:  Hypertension, Hyperlipidemia, Coronary Artery Disease, GERD, Chronic Kidney Disease, Depression, Osteoarthritis, and Vitamin B12 deficiency  Conditions addressed this visit: Hypertension, CAD  Care Plan : [place] Updates made by PViona Gilmore RH[place] since 04/26/2022 12:00 AM     Problem: Problem: Hypertension, Hyperlipidemia, Coronary Artery Disease, GERD, Chronic Kidney Disease, Depression, Osteoarthritis, and Vitamin B12 deficiency      Long-Range Goal: Patient-Specific Goal   Start Date: 09/19/2021  Expected End Date: 09/19/2022  Recent Progress: On track  Priority: High  Note:   Current Barriers:  Unable to independently monitor therapeutic efficacy  Pharmacist Clinical Goal(s):  Patient will achieve adherence to monitoring guidelines and medication adherence to achieve therapeutic efficacy through collaboration with PharmD and provider.   Interventions: 1:1 collaboration with BEulas Post MD regarding development and update of comprehensive plan of care as evidenced by provider attestation and co-signature Inter-disciplinary care team collaboration (see longitudinal plan of care) Comprehensive medication review performed; medication list updated in electronic medical record  Hypertension (BP goal <130/80) -Controlled -Current treatment: Losartan 50 mg  1 tablet daily - in AM - Appropriate, Query effective, Safe, Accessible Carvedilol 12.5 mg 1 tablet twice daily - Appropriate, Query effective, Safe, Accessible -Medications previously tried: n/a  -Current home readings: not rechecking (arm cuff - not had for a while) -Current dietary habits: limits salt intake; uses it sometimes (doesn't look at package labels); canned foods and frozen vegetables  -Current exercise habits: not able to with back pain -Denies hypotensive/hypertensive symptoms -Educated on  BP goals and benefits of medications for prevention of heart attack, stroke and kidney damage; Exercise goal of 150 minutes per week; Importance of home blood pressure monitoring; Proper BP monitoring technique; -Counseled to monitor BP at home weekly, document, and provide log at future appointments -Counseled on diet and exercise extensively Recommended to  continue current medication  Hyperlipidemia: (LDL goal < 70) -Controlled -Current treatment: Atorvastatin 40 mg 1 tablet daily - Appropriate, Effective, Safe, Accessible -Medications previously tried: none  -Current dietary patterns: doesn't eat fried foods very often; not eating much meat, chicken, fish some -Current exercise habits: not able to do much with back pain -Educated on Cholesterol goals;  Benefits of statin for ASCVD risk reduction; Importance of limiting foods high in cholesterol; -Counseled on diet and exercise extensively Recommended to continue current medication  Heart Failure (Goal: manage symptoms and prevent exacerbations) -Controlled -Last ejection fraction:  45-50% (Date: 09/01/18) -HF type: Systolic -NYHA Class: II (slight limitation of activity) -AHA HF Stage: C (Heart disease and symptoms present) -Current treatment: Losartan 50 mg  1 tablet daily - Appropriate, Effective, Safe, Accessible Carvedilol 12.5 mg 1 tablet twice daily - Appropriate, Effective, Safe, Accessible Furosemide  20 mg 1 tablet three times a week - Appropriate, Effective, Safe, Accessible -Medications previously tried: n/a  -Current home BP/HR readings: refer to above -Current dietary habits: tries to limit salt intake -Current exercise habits: unable to do much -Educated on Benefits of medications for managing symptoms and prolonging life Importance of weighing daily; if you gain more than 3 pounds in one day or 5 pounds in one week, call cardiology. Importance of blood pressure control -Counseled on diet and exercise extensively Recommended to continue current medication   Depression (Goal: minimize symptoms) -Not ideally controlled -Current treatment: Sertraline 50 mg 1 tablet daily - Appropriate, Query effective, Safe, Accessible -Medications previously tried/failed: n/a -PHQ9: 2  -GAD7: n/a -Educated on Benefits of medication for symptom control Benefits of  cognitive-behavioral therapy with or without medication -Recommended to continue current medication  CAD (Goal: prevent heart events) -Controlled -Current treatment  Aspirin 81 mg 1 tablet daily - Appropriate, Effective, Safe, Accessible Atorvastatin 40 mg 1 tablet daily - Appropriate, Effective, Safe, Accessible Nitroglycerin 0.4 mg 1 tablet as needed - Appropriate, Effective, Safe, Accessible -Medications previously tried: none  -Recommended to continue current medication Counseled on monitoring for bleeding and avoidance of NSAIDs.  GERD (Goal: minimize symptoms) -Controlled -Current treatment  Famotidine 20 mg 1 tablet as needed - Appropriate, Effective, Safe, Accessible -Medications previously tried: n/a  -Counseled on non-pharmacologic management of symptoms such as elevating the head of your bed, avoiding eating 2-3 hours before bed, avoiding triggering foods such as acidic, spicy, or fatty foods, eating smaller meals, and wearing clothes that are loose around the waist  Health Maintenance -Vaccine gaps: none -Current therapy:  Miralax as needed Vitamin B12 100 mcg daily Multivitamin 1 tablet daily Latanoprost 0.05% 1 drop in both eyes at bedtime -Educated on Cost vs benefit of each product must be carefully weighed by individual consumer -Patient is satisfied with current therapy and denies issues -Recommended to continue current medication  Patient Goals/Self-Care Activities Patient will:  - take medications as prescribed as evidenced by patient report and record review check blood pressure weekly, document, and provide at future appointments target a minimum of 150 minutes of moderate intensity exercise weekly  Follow Up Plan: Telephone follow up appointment with care management team member scheduled for: 6 months  Medication Assistance: None required.  Patient affirms current coverage meets needs.  Compliance/Adherence/Medication fill history: Care  Gaps: Last BP - 124/60 on 09/10/2021  Star-Rating Drugs: Atorvastatin 73m - last [facility]/20[facility] verified with pharmacist Losartan 561m- last filled [facility]/2022 at [facility]  [facility] [facility] [address] [place] [place] [place]: [phone]: [id number] [place]: [phone]: [phone] pill box? Yes - weekly - just one time slot Pt endorses 99% compliance  We discussed: Current pharmacy is preferred with insurance plan and patient is satisfied with pharmacy services Patient decided to: Continue current medication management strategy  Care Plan and Follow Up Patient Decision:  Patient agrees to Care Plan and Follow-up.  Plan: Telephone follow up appointment with care management team member scheduled for:  6 months  MadJeni SallesharmD, BCACayey [phone]

## 2022-04-16 NOTE — Telephone Encounter (Signed)
-----   Message from Viona Gilmore, [facility] sent at 04/15/2022  3:13 PM EDT ----- ?Regarding: CCM referral ?Hi again, ? ?This is another patient that I am seeing and needs a CCM referral. Can you please place that? Again the code is [id number]. ? ?Thank you! ?Maddie ? ?

## 2022-05-01 DIAGNOSIS — I5020 Unspecified systolic (congestive) heart failure: Secondary | ICD-10-CM

## 2022-05-01 DIAGNOSIS — I2510 Atherosclerotic heart disease of native coronary artery without angina pectoris: Secondary | ICD-10-CM | POA: Diagnosis not present

## 2022-05-01 DIAGNOSIS — E785 Hyperlipidemia, unspecified: Secondary | ICD-10-CM

## 2022-05-01 DIAGNOSIS — F32A Depression, unspecified: Secondary | ICD-10-CM

## 2022-05-01 DIAGNOSIS — R69 Illness, unspecified: Secondary | ICD-10-CM | POA: Diagnosis not present

## 2022-05-01 DIAGNOSIS — I110 Hypertensive heart disease with heart failure: Secondary | ICD-10-CM

## 2022-05-03 ENCOUNTER — Ambulatory Visit (INDEPENDENT_AMBULATORY_CARE_PROVIDER_SITE_OTHER): Payer: Medicare HMO | Admitting: Family Medicine

## 2022-05-03 ENCOUNTER — Encounter: Payer: Self-pay | Admitting: Family Medicine

## 2022-05-03 VITALS — BP 100/60 | HR 66 | Temp 98.0°F | Ht 68.5 in | Wt 133.8 lb

## 2022-05-03 DIAGNOSIS — N183 Chronic kidney disease, stage 3 unspecified: Secondary | ICD-10-CM

## 2022-05-03 DIAGNOSIS — I10 Essential (primary) hypertension: Secondary | ICD-10-CM

## 2022-05-03 DIAGNOSIS — I714 Abdominal aortic aneurysm, without rupture, unspecified: Secondary | ICD-10-CM | POA: Diagnosis not present

## 2022-05-03 DIAGNOSIS — R419 Unspecified symptoms and signs involving cognitive functions and awareness: Secondary | ICD-10-CM | POA: Diagnosis not present

## 2022-05-03 LAB — BASIC METABOLIC PANEL
BUN: 26 mg/dL — ABNORMAL HIGH (ref 6–23)
CO2: 31 mEq/L (ref 19–32)
Calcium: 9.6 mg/dL (ref 8.4–10.5)
Chloride: 103 mEq/L (ref 96–112)
Creatinine, Ser: 1.51 mg/dL — ABNORMAL HIGH (ref 0.40–1.50)
GFR: 42.22 mL/min — ABNORMAL LOW (ref >60.00)
Glucose, Bld: 107 mg/dL — ABNORMAL HIGH (ref 70–99)
Potassium: 4.6 mEq/L (ref 3.5–5.1)
Sodium: 140 mEq/L (ref 135–145)

## 2022-05-03 LAB — TSH: TSH: 1.37 u[IU]/mL (ref 0.35–5.50)

## 2022-05-03 LAB — VITAMIN B12: Vitamin B-12: 1483 pg/mL — ABNORMAL HIGH (ref 211–911)

## 2022-05-03 NOTE — Patient Instructions (Signed)
I am setting up repeat abdominal aortic ultrasound to reassess aneurysm.    Set up 3 month follow up.

## 2022-05-03 NOTE — Progress Notes (Unsigned)
Established Patient Office Visit  Subjective   Patient ID: [name], male    DOB: June 15, 1936  Age: 86 y.o. MRN: [id number]  Chief Complaint  Patient presents with   Memory Loss    HPI  {History (Optional):23778} Mr. Soler has history of CAD, abdominal aortic aneurysm, osteoarthritis, remote history of prostate cancer, chronic kidney disease, history of B12 deficiency, hyperlipidemia, lumbar stenosis.  He is here today with chief complaint of possible memory loss.  Apparently one of his daughters brought this up.  He had MRI brain 2018 which showed chronic microvascular changes but no acute finding.  He does not feel his memory is creating any major issues at this time.  Most recent TSH and B12 are normal.  Denies any recent head injury.  He still drives and lives independently.  He has not had abdominal aortic aneurysm screen since 2020.  Denies any recent abdominal pain.  He has hypertension which has been well controlled with losartan and carvedilol  Past Medical History:  Diagnosis Date   AAA (abdominal aortic aneurysm) ([place])    AAA (abdominal aortic aneurysm) ([place])    4-4 cm intrarenal aaa per US aorta 03-28-2019 epic   Benign positional vertigo    Bladder cancer ([place])    Bladder neck contracture    Coronary artery disease CARDIOLOGIST--  DR Acie Fredrickson   ED (erectile dysfunction)    GERD (gastroesophageal reflux disease)    H/O hiatal hernia    History of anal fissures    History of colon polyps    History of radiation therapy 08/24/12-10/22/12   prostate 6600cGy/33 sessions--  for biochemical recurrence   Hyperlipidemia    Hypertension    OA (osteoarthritis)    Peripheral vascular disease ([place])    Recurrent prostate adenocarcinoma ([place]) first dx 09/2005--  radial prostatectomy-- gleason 4+3=7, pT2c, negative margin   04/2012--  BIOCHEMICAL  RECURRENCE to 0.33 /  SALVAGE IMRT SEPT to  NOV 2013   S/P CABG x 2    09/  2014   S/P dilatation of esophageal stricture     1992   Sciatica    both hips at times mostly left hip   Sigmoid diverticulosis    SUI (stress urinary incontinence), male    Urethral stricture    Wears glasses    Past Surgical History:  Procedure Laterality Date   CARDIAC CATHETERIZATION  08-27-2013  DR Martinique   CRITICAL OSTIAL LM STENOSIS/  diffuse disease LAD 40-50%  &   pLCx 30-40%/   CARDIOVASCULAR STRESS TEST  08-18-2013  DR NASHER   SMALL/ MEDIUM AREA MILD SCAR INFEROSEPTAL WALL/ NO ISCHEMIA/  LOW RISK SCAN/  EF 62%/  LV WALL MOTION WITH SLIGHT DECREASED OF THE SEPTUM   CORONARY ANGIOPLASTY  1991   PTCA  of LEFT CFX   CORONARY ARTERY BYPASS GRAFT N/A 08/27/2013   Procedure: CORONARY ARTERY BYPASS GRAFTING (CABG) times two using left internal mammary and right saphenous vein using endoscope.;  Surgeon: Melrose Nakayama, MD;  Location: [facility];  Service: Open Heart Surgery;  Laterality: N/A;   CYSTOSCOPY WITH URETHRAL DILATATION N/A 01/26/2014   Procedure: CYSTOSCOPY WITH URETHRAL DILATATION, BLADDER BIOPSY, FOLEY CATHETER;  Surgeon: Molli Hazard, MD;  Location[facility];  Service: Urology;  Laterality: N/A;   INGUINAL HERNIA REPAIR Bilateral 1994   KNEE ARTHROSCOPY Right yrs ago   LEFT HEART CATHETERIZATION WITH CORONARY ANGIOGRAM N/A 08/27/2013   Procedure: LEFT HEART CATHETERIZATION WITH CORONARY ANGIOGRAM;  Surgeon: Ander Slade  Martinique, MD;  Location: [facility] CATH LAB;  Service: Cardiovascular;  Laterality: N/A;   LUMBAR DISC SURGERY  1980'S   RADICAL RETROPUBIC PROSTATECTOMY W/ BILATERAL PELVIC NODE DISSECTION  11-18-2005   TRANSURETHRAL INCISION OF PROSTATE N/A 11/01/2020   Procedure: TRANSURETHRAL INCISION OF THE BLADDER NECK;  Surgeon: Ardis Hughs, MD;  Location: [facility];  Service: Urology;  Laterality: N/A;   URETHROTOMY N/A 05/30/2014   Procedure: CYSTOSCOPY  BALLOON DILATION AND DIRECT VISION INTERNAL URETHROTOMY;  Surgeon: Sharyn Creamer, MD;  Location: [facility];  Service:  Urology;  Laterality: N/A;    reports that he quit smoking about 27 years ago. His smoking use included cigarettes. He has a 20.00 pack-year smoking history. He has never used smokeless tobacco. He reports current alcohol use. He reports that he does not use drugs. family history includes Arthritis in an other family member; Cancer in his brother and father; Hyperlipidemia in an other family member; Hypertension in an other family member. Allergies  Allergen Reactions   Bee Venom Other (See Comments)    Reaction swelling   Amoxicillin Rash   Hydrocodone Other (See Comments)    REACTION: disorientation    Review of Systems  Constitutional:  Negative for chills and fever.  Cardiovascular:  Negative for chest pain.  Gastrointestinal:  Negative for abdominal pain.  Neurological:  Negative for dizziness, speech change, focal weakness, seizures, loss of consciousness and headaches.  Psychiatric/Behavioral:  Positive for memory loss. Negative for depression.      Objective:     BP 100/60 (BP Location: Left Arm, Patient Position: Sitting, Cuff Size: Normal)   Pulse 66   Temp 98 F (36.7 C) (Oral)   Ht 5' 8.5" (1.74 m)   Wt 133 lb 12.8 oz (60.7 kg)   SpO2 99%   BMI 20.05 kg/m  {Vitals History (Optional):23777}  Physical Exam Vitals reviewed.  Constitutional:      Appearance: Normal appearance.  Cardiovascular:     Rate and Rhythm: Normal rate and regular rhythm.  Pulmonary:     Effort: Pulmonary effort is normal.     Breath sounds: Normal breath sounds.  Neurological:     General: No focal deficit present.     Mental Status: He is alert.     Cranial Nerves: No cranial nerve deficit.  Psychiatric:     Comments: MMSE 27/30.  He had difficulty with clock drawing     No results found for any visits on 05/03/22.  {Labs (Optional):23779}  The ASCVD Risk score (Arnett DK, et al., 2019) failed to calculate for the following reasons:   The 2019 ASCVD risk score is only valid  for ages 14 to 27    Assessment & Plan:   Problem List Items Addressed This Visit       Unprioritized   CKD (chronic kidney disease) stage 3, GFR 30-59 ml/min (HCC)   Essential hypertension   Relevant Orders   Basic metabolic panel   Abdominal aortic aneurysm ([place])   Other Visit Diagnoses     Cognitive complaints    -  Primary   Relevant Orders   TSH   Vitamin B12     -Set up repeat abdominal aortic aneurysm monitoring.  He is overdue.  -He scored 27/30 on MMSE.  No recent for comparison.  We have strongly advocated that he bring his daughter with him to next visit but he seemed reluctant.  -Recheck TSH and B12 along with basic metabolic panel  -  Set up 89-monthfollow-up.  Would consider serial monitoring of cognition  Return in about 3 months (around 08/03/2022).    BCarolann Littler MD

## 2022-05-08 ENCOUNTER — Other Ambulatory Visit: Payer: Medicare HMO

## 2022-05-09 DIAGNOSIS — H5340 Unspecified visual field defects: Secondary | ICD-10-CM | POA: Diagnosis not present

## 2022-05-09 DIAGNOSIS — H18513 Endothelial corneal dystrophy, bilateral: Secondary | ICD-10-CM | POA: Diagnosis not present

## 2022-05-09 DIAGNOSIS — H02831 Dermatochalasis of right upper eyelid: Secondary | ICD-10-CM | POA: Diagnosis not present

## 2022-05-09 DIAGNOSIS — H401131 Primary open-angle glaucoma, bilateral, mild stage: Secondary | ICD-10-CM | POA: Diagnosis not present

## 2022-05-14 ENCOUNTER — Telehealth: Payer: Self-pay | Admitting: Pharmacist

## 2022-05-14 ENCOUNTER — Ambulatory Visit (INDEPENDENT_AMBULATORY_CARE_PROVIDER_SITE_OTHER): Payer: Medicare HMO

## 2022-05-14 VITALS — BP 116/60 | HR 65 | Temp 97.7°F | Ht 66.0 in | Wt 133.3 lb

## 2022-05-14 DIAGNOSIS — Z0000 Encounter for general adult medical examination without abnormal findings: Secondary | ICD-10-CM | POA: Diagnosis not present

## 2022-05-14 NOTE — Progress Notes (Signed)
Subjective:   [name] is a 86 y.o. male who presents for Medicare Annual/Subsequent preventive examination.  Review of Systems     Cardiac Risk Factors include: advanced age (>35mn, >>55women);dyslipidemia;hypertension;male gender     Objective:    Today's Vitals   05/14/22 1138  BP: 116/60  Pulse: 65  Temp: 97.7 F (36.5 C)  TempSrc: Oral  SpO2: 92%  Weight: 133 lb 4.8 oz (60.5 kg)  Height: '5\' 6"'$  (1.676 m)   Body mass index is 21.52 kg/m.     05/14/2022   11:47 AM 05/08/2021   11:21 AM 03/15/2021   11:51 AM 09/27/2020    1:04 PM 06/24/2018   11:28 AM 05/30/2014    1:21 PM 01/24/2014    1:25 PM  Advanced Directives  Does Patient Have a Medical Advance Directive? Yes Yes No Yes Yes Patient has advance directive, copy not in chart Patient has advance directive, copy in chart  Type of Advance Directive HSequimLiving will Healthcare Power of ACapacLiving will  Living will Living will  Does patient want to make changes to medical advance directive?    No - Patient declined  No change requested   Copy of HWaldronin Chart? No - copy requested No - copy requested         Current Medications (verified) Outpatient Encounter Medications as of 05/14/2022  Medication Sig   acetaminophen (TYLENOL) 500 MG tablet Take 500 mg by mouth every 6 (six) hours as needed.   aspirin 81 MG EC tablet Take 1 tablet (81 mg total) by mouth daily.   atorvastatin (LIPITOR) 40 MG tablet TAKE 1 TABLET BY MOUTH EVERY DAY   carvedilol (COREG) 12.5 MG tablet TAKE 1 TABLET BY MOUTH TWICE A DAY   Cyanocobalamin (VITAMIN B12) 1000 MCG TBCR Take 100 mcg by mouth daily.   famotidine (PEPCID) 20 MG tablet Take 20 mg by mouth as needed for heartburn or indigestion.   furosemide (LASIX) 20 MG tablet TAKE 1 TABLET (20 MG TOTAL) BY MOUTH 3 (THREE) TIMES A WEEK. ON MONDAY, WEDNESDAY, FRIDAY   latanoprost (XALATAN) 0.005 % ophthalmic  solution Place 1 drop into both eyes at bedtime.    losartan (COZAAR) 50 MG tablet TAKE 1 TABLET BY MOUTH EVERY DAY   Multiple Vitamin (MULTIVITAMIN WITH MINERALS) TABS tablet Take 1 tablet by mouth daily.   nitroGLYCERIN (NITROSTAT) 0.4 MG SL tablet PLACE 1 TABLET UNDER THE TONUE EVERY 5 MINUTES AS NEEDED FOR CHEST PAIN.   polyethylene glycol (MIRALAX / GLYCOLAX) packet Take 17 g by mouth daily as needed (for constipation).   sertraline (ZOLOFT) 50 MG tablet TAKE 1 TABLET BY MOUTH EVERY DAY   No facility-administered encounter medications on file as of 05/14/2022.    Allergies (verified) Bee venom, Amoxicillin, and Hydrocodone   History: Past Medical History:  Diagnosis Date   AAA (abdominal aortic aneurysm) (HCC)    AAA (abdominal aortic aneurysm) (HCC)    4-4 cm intrarenal aaa per [place] 03-28-2019 epic   Benign positional vertigo    Bladder cancer ([facility]    Bladder neck contracture    Coronary artery disease CARDIOLOGIST--  DR NAcie Fredrickson  ED (erectile dysfunction)    GERD (gastroesophageal reflux disease)    H/O hiatal hernia    History of anal fissures    History of colon polyps    History of radiation therapy 08/24/12-10/22/12   prostate 6600cGy/33 sessions--  for  biochemical recurrence   Hyperlipidemia    Hypertension    OA (osteoarthritis)    Peripheral vascular disease (HCC)    Recurrent prostate adenocarcinoma ([place]) first dx 09/2005--  radial prostatectomy-- gleason 4+3=7, pT2c, negative margin   04/2012--  BIOCHEMICAL  RECURRENCE to 0.33 /  SALVAGE IMRT SEPT to  NOV 2013   S/P CABG x 2    09/  2014   S/P dilatation of esophageal stricture    1992   Sciatica    both hips at times mostly left hip   Sigmoid diverticulosis    SUI (stress urinary incontinence), male    Urethral stricture    Wears glasses    Past Surgical History:  Procedure Laterality Date   CARDIAC CATHETERIZATION  08-27-2013  DR Martinique   CRITICAL OSTIAL LM STENOSIS/  diffuse disease LAD 40-50%  &    pLCx 30-40%/   CARDIOVASCULAR STRESS TEST  08-18-2013  DR NASHER   SMALL/ MEDIUM AREA MILD SCAR INFEROSEPTAL WALL/ NO ISCHEMIA/  LOW RISK SCAN/  EF 62%/  LV WALL MOTION WITH SLIGHT DECREASED OF THE SEPTUM   CORONARY ANGIOPLASTY  1991   PTCA  of LEFT CFX   CORONARY ARTERY BYPASS GRAFT N/A 08/27/2013   Procedure: CORONARY ARTERY BYPASS GRAFTING (CABG) times two using left internal mammary and right saphenous vein using endoscope.;  Surgeon: Melrose Nakayama, MD;  Location: [place];  Service: Open Heart Surgery;  Laterality: N/A;   CYSTOSCOPY WITH URETHRAL DILATATION N/A 01/26/2014   Procedure: CYSTOSCOPY WITH URETHRAL DILATATION, BLADDER BIOPSY, FOLEY CATHETER;  Surgeon: Molli Hazard, MD;  [facility];  Service: Urology;  Laterality: N/A;   INGUINAL HERNIA REPAIR Bilateral 1994   KNEE ARTHROSCOPY Right yrs ago   LEFT HEART CATHETERIZATION WITH CORONARY ANGIOGRAM N/A 08/27/2013   Procedure: LEFT HEART CATHETERIZATION WITH CORONARY ANGIOGRAM;  Surgeon: Peter M Martinique, MD;  Location: [facility] CATH LAB;  Service: Cardiovascular;  Laterality: N/A;   LUMBAR DISC SURGERY  1980'S   RADICAL RETROPUBIC PROSTATECTOMY W/ BILATERAL PELVIC NODE DISSECTION  11-18-2005   TRANSURETHRAL INCISION OF PROSTATE N/A 11/01/2020   Procedure: TRANSURETHRAL INCISION OF THE BLADDER NECK;  Surgeon: Ardis Hughs, MD;  Location: [facility];  Service: Urology;  Laterality: N/A;   URETHROTOMY N/A 05/30/2014   Procedure: CYSTOSCOPY  BALLOON DILATION AND DIRECT VISION INTERNAL URETHROTOMY;  Surgeon: Sharyn Creamer, MD;  Location: [facility];  Service: Urology;  Laterality: N/A;   Family History  Problem Relation Age of Onset   Cancer Father        prostate died 23 something   Cancer Brother        prostate cancer   Arthritis Other    Hyperlipidemia Other    Hypertension Other    Social History   Socioeconomic History   Marital status: Single    Spouse name: Not on file    Number of children: 2   Years of education: 13   Highest education level: Not on file  Occupational History   Occupation: Retired    Fish farm manager: RETIRED  Tobacco Use   Smoking status: Former    Packs/day: 2.00    Years: 10.00    Total pack years: 20.00    Types: Cigarettes    Quit date: 12/03/1994    Years since quitting: 27.4   Smokeless tobacco: Never  Vaping Use   Vaping Use: Never used  Substance and Sexual Activity   Alcohol use: Yes  Comment: occasionally   Drug use: No   Sexual activity: Not on file  Other Topics Concern   Not on file  Social History Narrative   Lives alone   Caffeine use: Coffee- 3 cups daily   Soda sometimes.    Social Determinants of Health   Financial Resource Strain: Low Risk  (05/14/2022)   Overall Financial Resource Strain (CARDIA)    Difficulty of Paying Living Expenses: Not hard at all  Food Insecurity: No Food Insecurity (05/14/2022)   Hunger Vital Sign    Worried About Running Out of Food in the Last Year: Never true    Ran Out of Food in the Last Year: Never true  Transportation Needs: No Transportation Needs (05/14/2022)   PRAPARE - Hydrologist (Medical): No    Lack of Transportation (Non-Medical): No  Physical Activity: Inactive (05/14/2022)   Exercise Vital Sign    Days of Exercise per Week: 0 days    Minutes of Exercise per Session: 0 min  Stress: No Stress Concern Present (05/14/2022)   [place]    Feeling of Stress : Not at all  Social Connections: Socially Isolated (05/08/2021)   Social Connection and Isolation Panel [NHANES]    Frequency of Communication with Friends and Family: Twice a week    Frequency of Social Gatherings with Friends and Family: Never    Attends Religious Services: Never    Marine scientist or Organizations: No    Attends Archivist Meetings: Never    Marital Status: Widowed    Tobacco  Counseling Counseling given: Not Answered   Clinical Intake:  Pre-visit preparation completed: Yes  Pain : No/denies pain     Nutritional Status: BMI of 19-24  Normal Nutritional Risks: None Diabetes: No  How often do you need to have someone help you when you read instructions, pamphlets, or other written materials from your doctor or pharmacy?: 1 - Never What is the last grade level you completed in school?: 36yrcollege  Diabetic? no  Interpreter Needed?: No  Information entered by :: NAllen LPN   Activities of Daily Living    05/14/2022   11:48 AM  In your present state of health, do you have any difficulty performing the following activities:  Hearing? 0  Vision? 0  Difficulty concentrating or making decisions? 0  Walking or climbing stairs? 0  Dressing or bathing? 0  Doing errands, shopping? 0  Preparing Food and eating ? N  Using the Toilet? N  In the past six months, have you accidently leaked urine? Y  Do you have problems with loss of bowel control? N  Managing your Medications? N  Managing your Finances? N  Housekeeping or managing your Housekeeping? N    Patient Care Team: BEulas Post MD as PCP - General [place], PWonda Cheng MD as PCP - Cardiology (Cardiology) GSharyne Peach MD as Consulting Physician (Ophthalmology) PViona Gilmore [facility] Hospitalas Pharmacist (Pharmacist)  Indicate any recent Medical Services you may have received from other than [facility] providers in the past year (date may be approximate).     Assessment:   This is a routine wellness examination for RElam  Hearing/Vision screen Vision Screening - Comments:: Regular eye exams,   Dietary issues and exercise activities discussed: Current Exercise Habits: The patient does not participate in regular exercise at present   Goals Addressed  This Visit's Progress    Patient Stated       05/14/2022, no goals       Depression Screen    05/14/2022   11:48 AM  01/22/2022    2:57 PM 09/19/2021    1:02 PM 05/08/2021   11:18 AM 09/28/2019   11:04 AM 09/28/2019   10:54 AM 06/24/2018   11:23 AM  PHQ 2/9 Scores  PHQ - 2 Score 1 0 '1 1 1 1 3  '$ PHQ- 9 Score  0 2    5    Fall Risk    05/14/2022   11:47 AM 05/03/2022   11:24 AM 05/08/2021   11:22 AM 09/28/2019   11:03 AM 09/28/2019   10:53 AM  Fall Risk   Falls in the past year? 0 0 0 0 0  Number falls in past yr: 0 0 0 0   Injury with Fall? 0 0 0    Risk for fall due to : Medication side effect No Fall Risks Impaired balance/gait;Impaired vision    Follow up Falls evaluation completed;Falls prevention discussed Falls evaluation completed Falls prevention discussed      FALL RISK PREVENTION PERTAINING TO THE HOME:  Any stairs in or around the home? No  If so, are there any without handrails?  N/a Home free of loose throw rugs in walkways, pet beds, electrical cords, etc? Yes  Adequate lighting in your home to reduce risk of falls? Yes   ASSISTIVE DEVICES UTILIZED TO PREVENT FALLS:  Life alert? No  Use of a cane, walker or w/c? No  Grab bars in the bathroom? No  Shower chair or bench in shower? Yes  Elevated toilet seat or a handicapped toilet? No   TIMED UP AND GO:  Was the test performed? No .    Gait steady and fast without use of assistive device  Cognitive Function:        05/14/2022   11:49 AM 05/08/2021   11:24 AM  6CIT Screen  What Year? 0 points 0 points  What month? 0 points 0 points  What time? 3 points 0 points  Count back from 20 0 points 0 points  Months in reverse 4 points 4 points  Repeat phrase 8 points 6 points  Total Score 15 points 10 points    Immunizations Immunization History  Administered Date(s) Administered   Fluad Quad(high Dose 65+) 08/03/2020, 08/08/2021   Influenza Split 08/25/2012   Influenza Whole 08/16/2010   Influenza, High Dose Seasonal PF 08/12/2016, 08/20/2017   Influenza,inj,Quad PF,6+ Mos 08/24/2013, 08/04/2014, 08/10/2015    Influenza,inj,quad, With Preservative 11/01/2017   Influenza-Unspecified 08/11/2018   PFIZER Comirnaty(Gray Top)Covid-19 Tri-Sucrose Vaccine 03/06/2021   PFIZER(Purple Top)SARS-COV-2 Vaccination 01/06/2020, 01/31/2020, 09/05/2020   Pfizer Covid-19 Vaccine Bivalent Booster 43yr & up 08/23/2021   Pneumococcal Conjugate-13 08/04/2014   Pneumococcal Polysaccharide-23 12/05/2004   Td 11/08/2010, 05/21/2019   Zoster Recombinat (Shingrix) 10/25/2017, 01/29/2018    TDAP status: Up to date  Flu Vaccine status: Up to date  Pneumococcal vaccine status: Up to date  Covid-19 vaccine status: Completed vaccines  Qualifies for Shingles Vaccine? Yes   Zostavax completed Yes   Shingrix Completed?: Yes  Screening Tests Health Maintenance  Topic Date Due   INFLUENZA VACCINE  07/02/2022   TETANUS/TDAP  05/20/2029   Pneumonia Vaccine 86 Years old  Completed   COVID-19 Vaccine  Completed   Zoster Vaccines- Shingrix  Completed   HPV VACCINES  Aged Out    Health Maintenance  There  are no preventive care reminders to display for this patient.  Colorectal cancer screening: No longer required.   Lung Cancer Screening: (Low Dose CT Chest recommended if Age 68-80 years, 30 pack-year currently smoking OR have quit w/in 15years.) does not qualify.   Lung Cancer Screening Referral: no  Additional Screening:  Hepatitis C Screening: does not qualify;   Vision Screening: Recommended annual ophthalmology exams for early detection of glaucoma and other disorders of the eye. Is the patient up to date with their annual eye exam?  Yes  Who is the provider or what is the name of the office in which the patient attends annual eye exams? Can't remember name If pt is not established with a provider, would they like to be referred to a provider to establish care? No .   Dental Screening: Recommended annual dental exams for proper oral hygiene  Community Resource Referral / Chronic Care Management: CRR  required this visit?  No   CCM required this visit?  No      Plan:     I have personally reviewed and noted the following in the patient's chart:   Medical and social history Use of alcohol, tobacco or illicit drugs  Current medications and supplements including opioid prescriptions. Patient is not currently taking opioid prescriptions. Functional ability and status Nutritional status Physical activity Advanced directives List of other physicians Hospitalizations, surgeries, and ER visits in previous 12 months Vitals Screenings to include cognitive, depression, and falls Referrals and appointments  In addition, I have reviewed and discussed with patient certain preventive protocols, quality metrics, and best practice recommendations. A written personalized care plan for preventive services as well as general preventive health recommendations were provided to patient.     Kellie Simmering, LPN   [id number]   Nurse Notes: none

## 2022-05-14 NOTE — Chronic Care Management (AMB) (Signed)
Chronic Care Management Pharmacy Assistant   Name: [name]  MRN: [id number] DOB: 04-17-1936  Reason for Encounter: Disease State / Hypertension Assessment Call   Conditions to be addressed/monitored: HTN  Recent office visits:  05/14/2022 Glenna Durand LPN - Medicare annual wellness exam  05/03/2022 [name] MD - Patient was seen for Cognitive complaints and additional issues. No medication changes. Follow up in 3 months.   Recent consult visits:  None  Hospital visits:  None  Medications: Outpatient Encounter Medications as of 05/14/2022  Medication Sig   acetaminophen (TYLENOL) 500 MG tablet Take 500 mg by mouth every 6 (six) hours as needed.   aspirin 81 MG EC tablet Take 1 tablet (81 mg total) by mouth daily.   atorvastatin (LIPITOR) 40 MG tablet TAKE 1 TABLET BY MOUTH EVERY DAY   carvedilol (COREG) 12.5 MG tablet TAKE 1 TABLET BY MOUTH TWICE A DAY   Cyanocobalamin (VITAMIN B12) 1000 MCG TBCR Take 100 mcg by mouth daily.   famotidine (PEPCID) 20 MG tablet Take 20 mg by mouth as needed for heartburn or indigestion.   furosemide (LASIX) 20 MG tablet TAKE 1 TABLET (20 MG TOTAL) BY MOUTH 3 (THREE) TIMES A WEEK. ON MONDAY, WEDNESDAY, FRIDAY   latanoprost (XALATAN) 0.005 % ophthalmic solution Place 1 drop into both eyes at bedtime.    losartan (COZAAR) 50 MG tablet TAKE 1 TABLET BY MOUTH EVERY DAY   Multiple Vitamin (MULTIVITAMIN WITH MINERALS) TABS tablet Take 1 tablet by mouth daily.   nitroGLYCERIN (NITROSTAT) 0.4 MG SL tablet PLACE 1 TABLET UNDER THE TONUE EVERY 5 MINUTES AS NEEDED FOR CHEST PAIN.   polyethylene glycol (MIRALAX / GLYCOLAX) packet Take 17 g by mouth daily as needed (for constipation).   sertraline (ZOLOFT) 50 MG tablet TAKE 1 TABLET BY MOUTH EVERY DAY   No facility-administered encounter medications on file as of 05/14/2022.  Fill History: ATORVASTATIN 40 MG TABLET 03/15/2022 90   CARVEDILOL 12.'5MG'$  TAB 05/11/2022 90   FUROSEMIDE 20 MG  TABLET 04/13/2022 84   LATANOPROST 0.005% EYE DROPS 04/09/2022 90   LOSARTAN POTASSIUM 50 MG TAB 04/19/2022 90   SERTRALINE HCL 50 MG TABLET 03/15/2022 90   Reviewed chart prior to disease state call.   Recent Office Vitals: BP Readings from Last 3 Encounters:  05/14/22 116/60  05/03/22 100/60  04/16/22 (!) 118/58   Pulse Readings from Last 3 Encounters:  05/14/22 65  05/03/22 66  01/31/22 67    Wt Readings from Last 3 Encounters:  05/14/22 133 lb 4.8 oz (60.5 kg)  05/03/22 133 lb 12.8 oz (60.7 kg)  01/31/22 141 lb (64 kg)     Kidney Function Lab Results  Component Value Date/Time   CREATININE 1.51 (H) 05/03/2022 12:00 PM   CREATININE 1.67 (H) 07/31/2021 10:44 AM   CREATININE 1.55 (H) 01/15/2016 08:50 AM   GFR 42.22 (L) 05/03/2022 12:00 PM   GFRNONAA 39 (L) 03/15/2021 10:34 AM   GFRAA 56 (L) 01/24/2021 07:32 AM       Latest Ref Rng & Units 05/03/2022   12:00 PM 07/31/2021   10:44 AM 03/15/2021   10:34 AM  BMP  Glucose 70 - 99 mg/dL 107  80  102   BUN 6 - 23 mg/dL '26  29  22   '$ Creatinine 0.40 - 1.50 mg/dL 1.51  1.67  1.71   BUN/Creat Ratio 10 - 24  17    Sodium 135 - 145 mEq/L 140  141  137  Potassium 3.5 - 5.1 mEq/L 4.6  4.7  4.1   Chloride 96 - 112 mEq/L 103  103  106   CO2 19 - 32 mEq/L '31  27  26   '$ Calcium 8.4 - 10.5 mg/dL 9.6  9.7  9.2    Current antihypertensive regimen:  Carvedilol 12.5 mg twice daily Losartan 50 mg daily  How often are you checking your Blood Pressure?   Current home BP readings:   What recent interventions/DTPs have been made by any provider to improve Blood Pressure control since last CPP Visit: No recent interventions  Any recent hospitalizations or ED visits since last visit with CPP? No recent hospital visits.   What diet changes have been made to improve Blood Pressure Control?  Patient follows Breakfast - patient will have Lunch - patient will have Dinner - patient will have  What exercise is being done to improve  your Blood Pressure Control?    Adherence Review: Is the patient currently on ACE/ARB medication? Yes Does the patient have >5 day gap between last estimated fill dates? No  Unable to reach patient after several attempts, blood pressure assessment set to call again in 2 months.   Care Gaps: AWV - completed 05/14/2022 Last BP - 100/60 on 05/03/2022  Star Rating Drugs: Atorvastatin 40 mg - last filled 03/15/2022 90 [facility] at [facility] Losartan 50 mg - last filled 04/19/2022 90 DS at [place] Pharmacist Assistant [phone]

## 2022-05-14 NOTE — Patient Instructions (Signed)
[name] , Thank you for taking time to come for your Medicare Wellness Visit. I appreciate your ongoing commitment to your health goals. Please review the following plan we discussed and let me know if I can assist you in the future.   Screening recommendations/referrals: Colonoscopy: not required Recommended yearly ophthalmology/optometry visit for glaucoma screening and checkup Recommended yearly dental visit for hygiene and checkup  Vaccinations: Influenza vaccine: due 07/02/2022 Pneumococcal vaccine: completed 08/04/2014 Tdap vaccine: completed 05/21/2019, due 05/20/2029 Shingles vaccine: completed   Covid-19:  08/23/2021, 03/06/2021, 09/05/2020, 01/31/2020, 01/06/2020  Advanced directives: Please bring a copy of your POA (Power of Attorney) and/or Living Will to your next appointment.    Conditions/risks identified: none  Next appointment: Follow up in one year for your annual wellness visit.   Preventive Care 26 Years and Older, Male Preventive care refers to lifestyle choices and visits with your health care provider that can promote health and wellness. What does preventive care include? A yearly physical exam. This is also called an annual well check. Dental exams once or twice a year. Routine eye exams. Ask your health care provider how often you should have your eyes checked. Personal lifestyle choices, including: Daily care of your teeth and gums. Regular physical activity. Eating a healthy diet. Avoiding tobacco and drug use. Limiting alcohol use. Practicing safe sex. Taking low doses of aspirin every day. Taking vitamin and mineral supplements as recommended by your health care provider. What happens during an annual well check? The services and screenings done by your health care provider during your annual well check will depend on your age, overall health, lifestyle risk factors, and family history of disease. Counseling  Your health care provider may ask you questions  about your: Alcohol use. Tobacco use. Drug use. Emotional well-being. Home and relationship well-being. Sexual activity. Eating habits. History of falls. Memory and ability to understand (cognition). Work and work Statistician. Screening  You may have the following tests or measurements: Height, weight, and BMI. Blood pressure. Lipid and cholesterol levels. These may be checked every 5 years, or more frequently if you are over 36 years old. Skin check. Lung cancer screening. You may have this screening every year starting at age 73 if you have a 30-pack-year history of smoking and currently smoke or have quit within the past 15 years. Fecal occult blood test (FOBT) of the stool. You may have this test every year starting at age 22. Flexible sigmoidoscopy or colonoscopy. You may have a sigmoidoscopy every 5 years or a colonoscopy every 10 years starting at age 66. Prostate cancer screening. Recommendations will vary depending on your family history and other risks. Hepatitis C blood test. Hepatitis B blood test. Sexually transmitted disease (STD) testing. Diabetes screening. This is done by checking your blood sugar (glucose) after you have not eaten for a while (fasting). You may have this done every 1-3 years. Abdominal aortic aneurysm (AAA) screening. You may need this if you are a current or former smoker. Osteoporosis. You may be screened starting at age 12 if you are at high risk. Talk with your health care provider about your test results, treatment options, and if necessary, the need for more tests. Vaccines  Your health care provider may recommend certain vaccines, such as: Influenza vaccine. This is recommended every year. Tetanus, diphtheria, and acellular pertussis (Tdap, Td) vaccine. You may need a Td booster every 10 years. Zoster vaccine. You may need this after age 62. Pneumococcal 13-valent conjugate (PCV13) vaccine. One  dose is recommended after age 22. Pneumococcal  polysaccharide (PPSV23) vaccine. One dose is recommended after age 42. Talk to your health care provider about which screenings and vaccines you need and how often you need them. This information is not intended to replace advice given to you by your health care provider. Make sure you discuss any questions you have with your health care provider. Document Released: 12/15/2015 Document Revised: 08/07/2016 Document Reviewed: 09/19/2015 Elsevier Interactive Patient Education  2017 [employer] Prevention in the Home Falls can cause injuries. They can happen to people of all ages. There are many things you can do to make your home safe and to help prevent falls. What can I do on the outside of my home? Regularly fix the edges of walkways and driveways and fix any cracks. Remove anything that might make you trip as you walk through a door, such as a raised step or threshold. Trim any bushes or trees on the path to your home. Use bright outdoor lighting. Clear any walking paths of anything that might make someone trip, such as rocks or tools. Regularly check to see if handrails are loose or broken. Make sure that both sides of any steps have handrails. Any raised decks and porches should have guardrails on the edges. Have any leaves, snow, or ice cleared regularly. Use sand or salt on walking paths during winter. Clean up any spills in your garage right away. This includes oil or grease spills. What can I do in the bathroom? Use night lights. Install grab bars by the toilet and in the tub and shower. Do not use towel bars as grab bars. Use non-skid mats or decals in the tub or shower. If you need to sit down in the shower, use a plastic, non-slip stool. Keep the floor dry. Clean up any water that spills on the floor as soon as it happens. Remove soap buildup in the tub or shower regularly. Attach bath mats securely with double-sided non-slip rug tape. Do not have throw rugs and other  things on the floor that can make you trip. What can I do in the bedroom? Use night lights. Make sure that you have a light by your bed that is easy to reach. Do not use any sheets or blankets that are too big for your bed. They should not hang down onto the floor. Have a firm chair that has side arms. You can use this for support while you get dressed. Do not have throw rugs and other things on the floor that can make you trip. What can I do in the kitchen? Clean up any spills right away. Avoid walking on wet floors. Keep items that you use a lot in easy-to-reach places. If you need to reach something above you, use a strong step stool that has a grab bar. Keep electrical cords out of the way. Do not use floor polish or wax that makes floors slippery. If you must use wax, use non-skid floor wax. Do not have throw rugs and other things on the floor that can make you trip. What can I do with my stairs? Do not leave any items on the stairs. Make sure that there are handrails on both sides of the stairs and use them. Fix handrails that are broken or loose. Make sure that handrails are as long as the stairways. Check any carpeting to make sure that it is firmly attached to the stairs. Fix any carpet that is loose or worn.  Avoid having throw rugs at the top or bottom of the stairs. If you do have throw rugs, attach them to the floor with carpet tape. Make sure that you have a light switch at the top of the stairs and the bottom of the stairs. If you do not have them, ask someone to add them for you. What else can I do to help prevent falls? Wear shoes that: Do not have high heels. Have rubber bottoms. Are comfortable and fit you well. Are closed at the toe. Do not wear sandals. If you use a stepladder: Make sure that it is fully opened. Do not climb a closed stepladder. Make sure that both sides of the stepladder are locked into place. Ask someone to hold it for you, if possible. Clearly  mark and make sure that you can see: Any grab bars or handrails. First and last steps. Where the edge of each step is. Use tools that help you move around (mobility aids) if they are needed. These include: Canes. Walkers. Scooters. Crutches. Turn on the lights when you go into a dark area. Replace any light bulbs as soon as they burn out. Set up your furniture so you have a clear path. Avoid moving your furniture around. If any of your floors are uneven, fix them. If there are any pets around you, be aware of where they are. Review your medicines with your doctor. Some medicines can make you feel dizzy. This can increase your chance of falling. Ask your doctor what other things that you can do to help prevent falls. This information is not intended to replace advice given to you by your health care provider. Make sure you discuss any questions you have with your health care provider. Document Released: 09/14/2009 Document Revised: 04/25/2016 Document Reviewed: 12/23/2014 Elsevier Interactive Patient Education  2017 [employer].

## 2022-05-15 ENCOUNTER — Ambulatory Visit
Admission: RE | Admit: 2022-05-15 | Discharge: 2022-05-15 | Disposition: A | Discharge status: Home / Self Care | Payer: Medicare HMO | Source: Ambulatory Visit | Attending: Family Medicine | Admitting: Family Medicine

## 2022-05-15 DIAGNOSIS — I714 Abdominal aortic aneurysm, without rupture, unspecified: Secondary | ICD-10-CM | POA: Diagnosis not present

## 2022-05-20 ENCOUNTER — Telehealth: Payer: Self-pay | Admitting: Family Medicine

## 2022-05-20 NOTE — Telephone Encounter (Signed)
Pt daughter Caren Griffins (cindy) is on [place] and would like to discuss the patient visit that was on 05-03-2022

## 2022-05-20 NOTE — Telephone Encounter (Signed)
I spoke with the pt's daughter and OV scheduled for 05/22/2022

## 2022-05-21 ENCOUNTER — Telehealth: Payer: Self-pay

## 2022-05-21 NOTE — Telephone Encounter (Signed)
---  The caller states that her father has been having confusion. Forgets to eat and drink. Has papers in front of him and he can't figure out why he is getting the mail. Told her that he was walking around naked and couldn't remember where the underwear is. Was seen last week but they don't know what the doctor said. This has been going on for about 3 weeks.  05/20/2022 10:39:54 AM See HCP within 4 Hours (or PCP triage) Zorita Pang, RN, Deborah  Comments User: Marquis Buggy, RN Date/Time Eilene Ghazi Time): 05/20/2022 10:46:27 AM The caller was not with her father and this nurse asked if she could contact the sister who was with him yesterday and she gave her sister's name as Jenny Reichmann and her phone number is [phone]. She was with her father yesterday and states that he was confused about a bank statement and thought that it was his taxes. She states that she has a call in to the office as she is on the HIPAA list and Dr. Erick Blinks nurse is supposed to call her back and tell her about last week's visit. This nurse also explained that her father may have not mentioned any of these issues to the doctor but the doctor needs to be aware of these symptoms and she verbalized understanding.  Referrals REFERRED TO PCP OFFICE  Pt was seen for cognitive complaints on 05/03/22. Recommendation at that time was to return in 3 months; would consider serial monitoring of cognition. Pt has appt with PCP on 05/22/22 at 1130AM

## 2022-05-22 ENCOUNTER — Encounter: Payer: Self-pay | Admitting: Family Medicine

## 2022-05-22 ENCOUNTER — Ambulatory Visit (INDEPENDENT_AMBULATORY_CARE_PROVIDER_SITE_OTHER): Payer: Medicare HMO | Admitting: Family Medicine

## 2022-05-22 VITALS — BP 112/62 | HR 60 | Temp 97.7°F | Ht 66.0 in | Wt 133.0 lb

## 2022-05-22 DIAGNOSIS — G3184 Mild cognitive impairment, so stated: Secondary | ICD-10-CM

## 2022-05-22 DIAGNOSIS — R634 Abnormal weight loss: Secondary | ICD-10-CM

## 2022-05-22 DIAGNOSIS — K5900 Constipation, unspecified: Secondary | ICD-10-CM | POA: Diagnosis not present

## 2022-05-22 NOTE — Progress Notes (Signed)
Established Patient Office Visit  Subjective   Patient ID: [name], male    DOB: Aug 14, 1936  Age: 86 y.o. MRN: [id number]  Chief Complaint  Patient presents with   Follow-up    HPI   [name] is here accompanied by daughter to discuss family concerns for cognitive impairment.  Refer to prior note for details.  Daughter relates possible short-term memory issues.  For example, sometimes he does not member conversations they had a week ago.  They recently noted that he seemed to have difficulty operating a remote control.  Patient does still drive but usually very local.  No history of getting lost.  Lives alone.  Had recent labs including TSH and B12 which were normal.  He had MRI 2018 no NPH or other acute abnormalities.  Scored 27/30 on recent MMSE.  He had some recent mild nonspecific intermittent mid lower abdominal pain.  Sometimes goes a few days without bowel movement and sometimes has to strain.  No recent change in stools otherwise.  No nausea or vomiting.  No fever.  Has had some mild gradual weight loss.  He states his appetite is fair.  Daughter thinks a lot of this is social and related to him living alone and not taking time to prepare meals.  She is looking at options to try to help supplement his meals.  Currently he is not doing any nutritional supplements.  Albumin a year ago was normal.  Past Medical History:  Diagnosis Date   AAA (abdominal aortic aneurysm) ([place])    AAA (abdominal aortic aneurysm) ([facility])    4-4 cm intrarenal aaa per US aorta 03-28-2019 epic   Benign positional vertigo    Bladder cancer ([place])    Bladder neck contracture    Coronary artery disease CARDIOLOGIST--  DR Acie Fredrickson   ED (erectile dysfunction)    GERD (gastroesophageal reflux disease)    H/O hiatal hernia    History of anal fissures    History of colon polyps    History of radiation therapy 08/24/12-10/22/12   prostate 6600cGy/33 sessions--  for biochemical recurrence   Hyperlipidemia     Hypertension    OA (osteoarthritis)    Peripheral vascular disease ([place])    Recurrent prostate adenocarcinoma ([place]) first dx 09/2005--  radial prostatectomy-- gleason 4+3=7, pT2c, negative margin   04/2012--  BIOCHEMICAL  RECURRENCE to 0.33 /  SALVAGE IMRT SEPT to  NOV 2013   S/P CABG x 2    09/  2014   S/P dilatation of esophageal stricture    1992   Sciatica    both hips at times mostly left hip   Sigmoid diverticulosis    SUI (stress urinary incontinence), male    Urethral stricture    Wears glasses    Past Surgical History:  Procedure Laterality Date   CARDIAC CATHETERIZATION  08-27-2013  DR Martinique   CRITICAL OSTIAL LM STENOSIS/  diffuse disease LAD 40-50%  &   pLCx 30-40%/   CARDIOVASCULAR STRESS TEST  08-18-2013  DR NASHER   SMALL/ MEDIUM AREA MILD SCAR INFEROSEPTAL WALL/ NO ISCHEMIA/  LOW RISK SCAN/  EF 62%/  LV WALL MOTION WITH SLIGHT DECREASED OF THE SEPTUM   CORONARY ANGIOPLASTY  1991   PTCA  of LEFT CFX   CORONARY ARTERY BYPASS GRAFT N/A 08/27/2013   Procedure: CORONARY ARTERY BYPASS GRAFTING (CABG) times two using left internal mammary and right saphenous vein using endoscope.;  Surgeon: Melrose Nakayama, MD;  Location: [facility];  Service: Open Heart Surgery;  Laterality: N/A;   CYSTOSCOPY WITH URETHRAL DILATATION N/A 01/26/2014   Procedure: CYSTOSCOPY WITH URETHRAL DILATATION, BLADDER BIOPSY, FOLEY CATHETER;  Surgeon: Molli Hazard, MD;  Location: [facility];  Service: Urology;  Laterality: N/A;   INGUINAL HERNIA REPAIR Bilateral 1994   KNEE ARTHROSCOPY Right yrs ago   LEFT HEART CATHETERIZATION WITH CORONARY ANGIOGRAM N/A 08/27/2013   Procedure: LEFT HEART CATHETERIZATION WITH CORONARY ANGIOGRAM;  Surgeon: Peter M Martinique, MD;  Location: [facility] C[facility];  Service: Cardiovascular;  Laterality: N/A;   LUMBAR DISC SURGERY  1980'S   RADICAL RETROPUBIC PROSTATECTOMY W/ BILATERAL PELVIC NODE DISSECTION  11-18-2005   TRANSURETHRAL INCISION OF PROSTATE N/A 11/01/2020   Procedure:  TRANSURETHRAL INCISION OF THE BLADDER NECK;  Surgeon: Ardis Hughs, MD;  Location: [facility];  Service: Urology;  Laterality: N/A;   URETHROTOMY N/A 05/30/2014   Procedure: CYSTOSCOPY  BALLOON DILATION AND DIRECT VISION INTERNAL URETHROTOMY;  Surgeon: Sharyn Creamer, MD;  Location: [facility];  Service: Urology;  Laterality: N/A;    reports that he quit smoking about 27 years ago. His smoking use included cigarettes. He has a 20.00 pack-year smoking history. He has never used smokeless tobacco. He reports current alcohol use. He reports that he does not use drugs. family history includes Arthritis in an other family member; Cancer in his brother and father; Hyperlipidemia in an other family member; Hypertension in an other family member. Allergies  Allergen Reactions   Bee Venom Other (See Comments)    Reaction swelling   Amoxicillin Rash   Hydrocodone Other (See Comments)    REACTION: disorientation    Review of Systems  Constitutional:  Negative for chills, fever and malaise/fatigue.  Eyes:  Negative for blurred vision.  Respiratory:  Negative for shortness of breath.   Cardiovascular:  Negative for chest pain.  Gastrointestinal:  Positive for abdominal pain and constipation. Negative for blood in stool, melena, nausea and vomiting.  Neurological:  Negative for dizziness, weakness and headaches.      Objective:     BP 112/62 (BP Location: Left Arm, Patient Position: Sitting, Cuff Size: Normal)   Pulse 60   Temp 97.7 F (36.5 C) (Oral)   Ht '5\' 6"'  (1.676 m)   Wt 133 lb (60.3 kg)   SpO2 98%   BMI 21.47 kg/m  BP Readings from Last 3 Encounters:  05/22/22 112/62  05/14/22 116/60  05/03/22 100/60   Wt Readings from Last 3 Encounters:  05/22/22 133 lb (60.3 kg)  05/14/22 133 lb 4.8 oz (60.5 kg)  05/03/22 133 lb 12.8 oz (60.7 kg)      Physical Exam Constitutional:      Appearance: He is well-developed.  HENT:     Right Ear:  External ear normal.     Left Ear: External ear normal.  Eyes:     Pupils: Pupils are equal, round, and reactive to light.  Neck:     Thyroid: No thyromegaly.  Cardiovascular:     Rate and Rhythm: Normal rate and regular rhythm.  Pulmonary:     Effort: Pulmonary effort is normal. No respiratory distress.     Breath sounds: Normal breath sounds. No wheezing or rales.  Abdominal:     General: Bowel sounds are normal. There is no distension.     Palpations: Abdomen is soft. There is no mass.     Tenderness: There is no abdominal tenderness. There is no guarding or rebound.  Musculoskeletal:  Cervical back: Neck supple.  Neurological:     Mental Status: He is alert and oriented to person, place, and time.      No results found for any visits on 05/22/22.  Last CBC Lab Results  Component Value Date   WBC 5.5 03/15/2021   HGB 11.9 (L) 03/15/2021   HCT 36.3 (L) 03/15/2021   MCV 109.3 (H) 03/15/2021   MCH 35.8 (H) 03/15/2021   RDW 12.4 03/15/2021   PLT 139 (L) 88/64/8472   Last metabolic panel Lab Results  Component Value Date   GLUCOSE 107 (H) 05/03/2022   NA 140 05/03/2022   K 4.6 05/03/2022   CL 103 05/03/2022   CO2 31 05/03/2022   BUN 26 (H) 05/03/2022   CREATININE 1.51 (H) 05/03/2022   EGFR 40 (L) 07/31/2021   CALCIUM 9.6 05/03/2022   PROT 6.2 (L) 03/15/2021   ALBUMIN 3.7 03/15/2021   LABGLOB 2.4 01/13/2017   AGRATIO 1.9 01/13/2017   BILITOT 0.6 03/15/2021   ALKPHOS 53 03/15/2021   AST 27 03/15/2021   ALT 17 07/31/2021   ANIONGAP 5 03/15/2021   Last hemoglobin A1c No results found for: "HGBA1C" Last thyroid functions Lab Results  Component Value Date   TSH 1.37 05/03/2022   Last vitamin B12 and Folate Lab Results  Component Value Date   VITAMINB12 1,483 (H) 05/03/2022      The ASCVD Risk score (Arnett DK, et al., 2019) failed to calculate for the following reasons:   The 2019 ASCVD risk score is only valid for ages 67 to 61    Assessment &  Plan:   #1 mild cognitive impairment with recent MMSE 27/30.  Normal B12 and TSH.  Last MRI brain 2018. We did discuss possible referral to neuropsychologist and they declined at this time -We discussed setting up 3 to 34-monthfollow-up and consider repeat cognitive screening at that time -Discussed limited role of medications such as Aricept with mild cognitive impairment.  If declining further at follow-up consider initiating Aricept  #2 weight loss.  Gradual and modest over the past year. -Discussed healthy practical ways to boost his calorie intake.  Patient states he frequently "forgets to eat ".  -Try to establish more frequent regular meals -Consider nutritional supplements such as boost or Ensure -Reassess at 350-monthollow-up -We did discuss possible medication such as mirtazapine but would hold at this time  #3 intermittent lower abdominal pain.  Suspect possibly related to constipation.  Increase fluid and fiber.  Consider as needed use of MiraLAX.  Return in about 3 months (around 08/22/2022).    BrCarolann LittlerMD

## 2022-06-27 DIAGNOSIS — Z8546 Personal history of malignant neoplasm of prostate: Secondary | ICD-10-CM | POA: Diagnosis not present

## 2022-07-04 DIAGNOSIS — D303 Benign neoplasm of bladder: Secondary | ICD-10-CM | POA: Diagnosis not present

## 2022-07-04 DIAGNOSIS — Z8546 Personal history of malignant neoplasm of prostate: Secondary | ICD-10-CM | POA: Diagnosis not present

## 2022-07-06 ENCOUNTER — Other Ambulatory Visit: Payer: Self-pay | Admitting: Family Medicine

## 2022-07-06 DIAGNOSIS — I2510 Atherosclerotic heart disease of native coronary artery without angina pectoris: Secondary | ICD-10-CM

## 2022-07-08 ENCOUNTER — Encounter: Payer: Self-pay | Admitting: Cardiovascular Disease

## 2022-07-08 ENCOUNTER — Other Ambulatory Visit: Payer: Self-pay | Admitting: Cardiovascular Disease

## 2022-07-08 NOTE — Progress Notes (Unsigned)
[name] Date of Birth  [birth year]         [address]    [place]    [place], [place]  [address]          Problem list: 1. Coronary artery disease- status post remote PTCA to the left complex artery in 1991, CABG 2014 2. Hypertension 3. Hyperlipidemia 4. Prostate Cancer.     [name] is  a 86 y.o. -year-old gentleman with a history of PTCA approximately 22 years ago.  He's not had any episodes of chest pain or shortness of breath. He stays fairly active although he doesn't exercise at the gym.  He has some problems with some arthritis-type pain in his right hip.  The Lipitor causes a little bit of muscular skeletal pain.  May 10, 2013:  He stopped hib Benicar 2 months ago. His BP has been running low at home.  He has been on a new medicine for his prostate and was concerned that his BP would run too low.   He has been taking a varied dose of benicar - 1 tab, 1/2 tab, 1/4 tablet whenever he thinks he needs it.   Sept. 5, 2014:  [name] presents today for worsening shortness of breath and chest pain.  Last Saturday, he mowed the lawn and spread 200 lbs of lime.  He developed severe CP and started to take some NTG.  The pain finally eased up. Several days later, he had recurrent CP while cleaning out the gutters.    He thinks he was just overheated.   He has been recording his BP and has been getting some low readings.     He has been having some hip pain and does not know if he will be able to walk on the treadmill.   Sept. 24, 2014: [name] was seen recently with some worsening chest pain. He had a stress Myoview study which was interpreted as low risk he has a fixed inferoapical defect.  He exertion, he is still having angina.  Lasts 5 minutes. In the center of his chest. No radiatioin.  Occurs only with exertion ( stating mower or Rototiller- never walking around the house.)   Class II - III angina.    Yesterday, he had some mild baseline CP which worsened while  walking around the grocery store.   Dec. 16, 2014:  [name] has had CABG since I last saw him in the office.  Still has chest tenderness - breathing is good, no CP  Chest wall is still tender.  May 11, 2014:  [name] is doing ok.   Jan. 4, 2015: Doing well  July 21, 2015:  Doing well from a cardiac standpoint  Still lonely ,  Mentally not where he needs to be since the death of his wife .   Visits with his 2 daughters frequently    Feb. 16, 2017:  Still grieving over the death of his wife , 16 months ago .   Were married 6 years.  Got some counseling at [facility] but was not as effective as he would like    Aug. 16, 2017: No CP or dyspnea  Has easy bruising on his arms due to the ASA walks some on the treadmill  No CP   Feb. 12, 2018  Has had some visual issues. Had a head MRI yesterday .   No CP or dyspnea   Oct. 4, 2019:  Has been having more leg swelling recently .  Still eats some salts  Fixes his own meals .   Still eats some canned veggies  Is not more short of breath compared to his usual . No CP  Has been having swelling of his left lower leg   Aug. 7, 2020:  Doing well.   .  Little of exercise.   Treadmill 30 min a day  No CP or dyspnea with walking   Feb. 9, 2021:  [name] is seen today . Has a hx of CAD and chronic combined CHF No CP .  Breathing is ok   Avoids salt .     07/31/2020:  No cardiac complains Has leg cramps at night  Has occasional low BP readings .  Occasionally associated with some dizziness.  No CP .    Feb. 28, 2022 [name] is seen today for follow of his CAD, AAA Still eating some sal t   July 31, 2021: [name] is seen today for follow-up of his coronary artery disease and abdominal aortic aneurysm.  Blood pressure is much better. Has some orthostasis at times, when he stands up from a squatting position.  Does not know if the symptoms are worse on days that he takes his furosemide ( takes it QOD )  Has lost weight since  last year Wt Readings from Last 3 Encounters:  05/22/22 133 lb (60.3 kg)  05/14/22 133 lb 4.8 oz (60.5 kg)  05/03/22 133 lb 12.8 oz (60.7 kg)     Aug. 8, 2023 [name] is seen for follow up of his CAD and AAA, CF    Current Outpatient Medications on File Prior to Visit  Medication Sig Dispense Refill   acetaminophen (TYLENOL) 500 MG tablet Take 500 mg by mouth every 6 (six) hours as needed.     aspirin 81 MG EC tablet Take 1 tablet (81 mg total) by mouth daily.     atorvastatin (LIPITOR) 40 MG tablet TAKE 1 TABLET BY MOUTH EVERY DAY 90 tablet 0   carvedilol (COREG) 12.5 MG tablet TAKE 1 TABLET BY MOUTH TWICE A DAY 180 tablet 3   Cyanocobalamin (VITAMIN B12) 1000 MCG TBCR Take 100 mcg by mouth daily.     famotidine (PEPCID) 20 MG tablet Take 20 mg by mouth as needed for heartburn or indigestion.     furosemide (LASIX) 20 MG tablet TAKE 1 TABLET (20 MG TOTAL) BY MOUTH 3 (THREE) TIMES A WEEK. ON MONDAY, WEDNESDAY, FRIDAY 36 tablet 3   latanoprost (XALATAN) 0.005 % ophthalmic solution Place 1 drop into both eyes at bedtime.      losartan (COZAAR) 50 MG tablet TAKE 1 TABLET BY MOUTH EVERY DAY 90 tablet 2   Multiple Vitamin (MULTIVITAMIN WITH MINERALS) TABS tablet Take 1 tablet by mouth daily.     nitroGLYCERIN (NITROSTAT) 0.4 MG SL tablet PLACE 1 TABLET UNDER THE TONUE EVERY 5 MINUTES AS NEEDED FOR CHEST PAIN. 75 tablet 3   polyethylene glycol (MIRALAX / GLYCOLAX) packet Take 17 g by mouth daily as needed (for constipation).     sertraline (ZOLOFT) 50 MG tablet TAKE 1 TABLET BY MOUTH EVERY DAY 90 tablet 3   No current facility-administered medications on file prior to visit.    Allergies  Allergen Reactions   Bee Venom Other (See Comments)    Reaction swelling   Amoxicillin Rash   Hydrocodone Other (See Comments)    REACTION: disorientation    Past Medical History:  Diagnosis Date   AAA (abdominal aortic aneurysm) ([place])  AAA (abdominal aortic aneurysm) ([facility])    4-4 cm intrarenal  aaa per US aorta 03-28-2019 epic   Benign positional vertigo    Bladder cancer [facility])    Bladder neck contracture    Coronary artery disease CARDIOLOGIST--  DR Acie Fredrickson   ED (erectile dysfunction)    GERD (gastroesophageal reflux disease)    H/O hiatal hernia    History of anal fissures    History of colon polyps    History of radiation therapy 08/24/12-10/22/12   prostate 6600cGy/33 sessions--  for biochemical recurrence   Hyperlipidemia    Hypertension    OA (osteoarthritis)    Peripheral vascular disease ([place])    Recurrent prostate adenocarcinoma ([place]) first dx 09/2005--  radial prostatectomy-- gleason 4+3=7, pT2c, negative margin   04/2012--  BIOCHEMICAL  RECURRENCE to 0.33 /  SALVAGE IMRT SEPT to  NOV 2013   S/P CABG x 2    09/  2014   S/P dilatation of esophageal stricture    1992   Sciatica    both hips at times mostly left hip   Sigmoid diverticulosis    SUI (stress urinary incontinence), male    Urethral stricture    Wears glasses     Past Surgical History:  Procedure Laterality Date   CARDIAC CATHETERIZATION  08-27-2013  DR Martinique   CRITICAL OSTIAL LM STENOSIS/  diffuse disease LAD 40-50%  &   pLCx 30-40%/   CARDIOVASCULAR STRESS TEST  08-18-2013  DR NASHER   SMALL/ MEDIUM AREA MILD SCAR INFEROSEPTAL WALL/ NO ISCHEMIA/  LOW RISK SCAN/  EF 62%/  LV WALL MOTION WITH SLIGHT DECREASED OF THE SEPTUM   CORONARY ANGIOPLASTY  1991   PTCA  of LEFT CFX   CORONARY ARTERY BYPASS GRAFT N/A 08/27/2013   Procedure: CORONARY ARTERY BYPASS GRAFTING (CABG) times two using left internal mammary and right saphenous vein using endoscope.;  Surgeon: Melrose Nakayama, MD;  Location: [facility];  Service: Open Heart Surgery;  Laterality: N/A;   CYSTOSCOPY WITH URETHRAL DILATATION N/A 01/26/2014   Procedure: CYSTOSCOPY WITH URETHRAL DILATATION, BLADDER BIOPSY, FOLEY CATHETER;  Surgeon: Molli Hazard, MD;  Location: [facility];  Service: Urology;  Laterality: N/A;   INGUINAL HERNIA REPAIR  Bilateral 1994   KNEE ARTHROSCOPY Right yrs ago   LEFT HEART CATHETERIZATION WITH CORONARY ANGIOGRAM N/A 08/27/2013   Procedure: LEFT HEART CATHETERIZATION WITH CORONARY ANGIOGRAM;  Surgeon: Peter M Martinique, MD;  Location: [facility] CATH [facility];  Service: Cardiovascular;  Laterality: N/A;   LUMBAR DISC SURGERY  1980'S   RADICAL RETROPUBIC PROSTATECTOMY W/ BILATERAL PELVIC NODE DISSECTION  11-18-2005   TRANSURETHRAL INCISION OF PROSTATE N/A 11/01/2020   Procedure: TRANSURETHRAL INCISION OF THE BLADDER NECK;  Surgeon: Ardis Hughs, MD;  [facility];  Service: Urology;  Laterality: N/A;   URETHROTOMY N/A 05/30/2014   Procedure: CYSTOSCOPY  BALLOON DILATION AND DIRECT VISION INTERNAL URETHROTOMY;  Surgeon: Sharyn Creamer, MD;  Location[facility];  Service: Urology;  Laterality: N/A;    Social History   Tobacco Use  Smoking Status Former   Packs/day: 2.00   Years: 10.00   Total pack years: 20.00   Types: Cigarettes   Quit date: 12/03/1994   Years since quitting: 27.6  Smokeless Tobacco Never    Social History   Substance and Sexual Activity  Alcohol Use Yes   Comment: occasionally    Family History  Problem Relation Age of Onset   Cancer Father  prostate died 40 something   Cancer Brother        prostate cancer   Arthritis Other    Hyperlipidemia Other    Hypertension Other     Reviw of Systems:    Physical Exam: There were no vitals taken for this visit.  GEN:  Well nourished, well developed in no acute distress HEENT: Normal NECK: No JVD; No carotid bruits LYMPHATICS: No lymphadenopathy CARDIAC: RRR ***, no murmurs, rubs, gallops RESPIRATORY:  Clear to auscultation without rales, wheezing or rhonchi  ABDOMEN: Soft, non-tender, non-distended MUSCULOSKELETAL:  No edema; No deformity  SKIN: Warm and dry NEUROLOGIC:  Alert and oriented x 3    ECG:      Assessment / Plan:   1. Coronary artery disease-    2.  Hypertension :      3. Hyperlipidemia-      4. Prostate Cancer.     5. Abdominal aortic aneurism  -    6.  Chronic combined congestive heart failure:       No recent dyspnea Is not having any significant shortness of breath.   Mertie Moores, MD  07/08/2022 8:53 PM    [address],  [place], [place]  [address] Pager [phone] Phone: [phone]; Fax: [phone]

## 2022-07-09 ENCOUNTER — Encounter: Payer: Self-pay | Admitting: Cardiovascular Disease

## 2022-07-09 ENCOUNTER — Ambulatory Visit: Payer: Medicare HMO | Admitting: Cardiovascular Disease

## 2022-07-09 VITALS — BP 112/66 | HR 60 | Ht 66.0 in | Wt 129.0 lb

## 2022-07-09 DIAGNOSIS — E785 Hyperlipidemia, unspecified: Secondary | ICD-10-CM

## 2022-07-09 DIAGNOSIS — I714 Abdominal aortic aneurysm, without rupture, unspecified: Secondary | ICD-10-CM | POA: Diagnosis not present

## 2022-07-09 DIAGNOSIS — I2510 Atherosclerotic heart disease of native coronary artery without angina pectoris: Secondary | ICD-10-CM

## 2022-07-09 LAB — ALT: ALT: 17 IU/L (ref 0–44)

## 2022-07-09 LAB — BASIC METABOLIC PANEL
BUN/Creatinine Ratio: 26 — ABNORMAL HIGH (ref 10–24)
BUN: 42 mg/dL — ABNORMAL HIGH (ref 8–27)
CO2: 25 mmol/L (ref 20–29)
Calcium: 9.5 mg/dL (ref 8.6–10.2)
Chloride: 106 mmol/L (ref 96–106)
Creatinine, Ser: 1.6 mg/dL — ABNORMAL HIGH (ref 0.76–1.27)
Glucose: 93 mg/dL (ref 70–99)
Potassium: 5.3 mmol/L — ABNORMAL HIGH (ref 3.5–5.2)
Sodium: 142 mmol/L (ref 134–144)
eGFR: 42 mL/min/{1.73_m2} — ABNORMAL LOW (ref >59)

## 2022-07-09 LAB — LIPID PANEL
Chol/HDL Ratio: 2.3 ratio (ref 0.0–5.0)
Cholesterol, Total: 118 mg/dL (ref 100–199)
HDL: 51 mg/dL (ref >39)
LDL Chol Calc (NIH): 52 mg/dL (ref 0–99)
Triglycerides: 74 mg/dL (ref 0–149)
VLDL Cholesterol Cal: 15 mg/dL (ref 5–40)

## 2022-07-09 NOTE — Patient Instructions (Signed)
Medication Instructions:  Your physician recommends that you continue on your current medications as directed. Please refer to the Current Medication list given to you today.  *If you need a refill on your cardiac medications before your next appointment, please call your pharmacy*  Lab Work: Your physician recommends that you have lab work today lipid panel, ALT, and BMET  If you have labs (blood work) drawn today and your tests are completely normal, you will receive your results only by: MyChart Message (if you have MyChart) OR A paper copy in the mail If you have any lab test that is abnormal or we need to change your treatment, we will call you to review the results.  Testing/Procedures: None ordered today.  Follow-Up: At [facility], you and your health needs are our priority.  As part of our continuing mission to provide you with exceptional heart care, we have created designated Provider Care Teams.  These Care Teams include your primary Cardiologist (physician) and Advanced Practice Providers (APPs -  Physician Assistants and Nurse Practitioners) who all work together to provide you with the care you need, when you need it.  We recommend signing up for the patient portal called "MyChart".  Sign up information is provided on this After Visit Summary.  MyChart is used to connect with patients for Virtual Visits (Telemedicine).  Patients are able to view lab/test results, encounter notes, upcoming appointments, etc.  Non-urgent messages can be sent to your provider as well.   To learn more about what you can do with MyChart, go to [email or web address].    Your next appointment:   1 year(s)  The format for your next appointment:   In Person  Provider:   Mertie Moores, MD {    Important Information About Sugar

## 2022-07-10 ENCOUNTER — Telehealth: Payer: Self-pay | Admitting: Pharmacist

## 2022-07-10 NOTE — Chronic Care Management (AMB) (Signed)
Chronic Care Management Pharmacy Assistant   Name: [name]  MRN: [id number] DOB: 06/20/1936  Reason for Encounter: Disease State / Hypertension Assessment Call   Conditions to be addressed/monitored: HTN  Recent office visits:  05/22/2022 Bruce Littler MD - 05/22/2022 Patient was seen for mild cognitive impairment and additional issues. No medication changes. Follow up in 3 months.   Recent consult visits:  07/09/2022 Bruce Jack MD (cardiology) - Patient was seen for Abdominal aortic aneurysm (AAA) without rupture, unspecified part and additional issues. No medication changes. Follow up in 1 year.   Hospital visits:  None  Medications: Outpatient Encounter Medications as of 07/10/2022  Medication Sig   acetaminophen (TYLENOL) 500 MG tablet Take 500 mg by mouth every 6 (six) hours as needed.   aspirin 81 MG EC tablet Take 1 tablet (81 mg total) by mouth daily.   atorvastatin (LIPITOR) 40 MG tablet TAKE 1 TABLET BY MOUTH EVERY DAY   carvedilol (COREG) 12.5 MG tablet TAKE 1 TABLET BY MOUTH TWICE A DAY   Cyanocobalamin (VITAMIN B12) 1000 MCG TBCR Take 100 mcg by mouth daily.   famotidine (PEPCID) 20 MG tablet Take 20 mg by mouth as needed for heartburn or indigestion.   furosemide (LASIX) 20 MG tablet Take 1 tablet (20 mg total) by mouth 3 (three) times a week. On Monday, Wednesday, Friday   latanoprost (XALATAN) 0.005 % ophthalmic solution Place 1 drop into both eyes at bedtime.    losartan (COZAAR) 50 MG tablet TAKE 1 TABLET BY MOUTH EVERY DAY   Multiple Vitamin (MULTIVITAMIN WITH MINERALS) TABS tablet Take 1 tablet by mouth daily.   nitroGLYCERIN (NITROSTAT) 0.4 MG SL tablet PLACE 1 TABLET UNDER THE TONUE EVERY 5 MINUTES AS NEEDED FOR CHEST PAIN.   polyethylene glycol (MIRALAX / GLYCOLAX) packet Take 17 g by mouth daily as needed (for constipation).   sertraline (ZOLOFT) 50 MG tablet TAKE 1 TABLET BY MOUTH EVERY DAY   No facility-administered encounter medications  on file as of 07/10/2022.  Fill History: ATORVASTATIN '40MG'$  TAB 07/05/2022 90   CARVEDILOL 12.5 MG TABLET 05/30/2022 90   FUROSEMIDE '20MG'$  TAB 07/09/2022 90   LATANOPROST 0.005% EYE DROPS 04/09/2022 90   LOSARTAN POTASSIUM 50 MG TAB 04/19/2022 90   NITROGLYCERIN 0.4 MG TABLET SL 08/01/2021 30   SERTRALINE HCL 50 MG TABLET 03/15/2022 90   Reviewed chart prior to disease state call. Spoke with patient regarding BP  Recent Office Vitals: BP Readings from Last 3 Encounters:  07/09/22 112/66  05/22/22 112/62  05/14/22 116/60   Pulse Readings from Last 3 Encounters:  07/09/22 60  05/22/22 60  05/14/22 65    Wt Readings from Last 3 Encounters:  07/09/22 129 lb (58.5 kg)  05/22/22 133 lb (60.3 kg)  05/14/22 133 lb 4.8 oz (60.5 kg)     Kidney Function Lab Results  Component Value Date/Time   CREATININE 1.60 (H) 07/09/2022 11:53 AM   CREATININE 1.51 (H) 05/03/2022 12:00 PM   CREATININE 1.55 (H) 01/15/2016 08:50 AM   GFR 42.22 (L) 05/03/2022 12:00 PM   GFRNONAA 39 (L) 03/15/2021 10:34 AM   GFRAA 56 (L) 01/24/2021 07:32 AM       Latest Ref Rng & Units 07/09/2022   11:53 AM 05/03/2022   12:00 PM 07/31/2021   10:44 AM  BMP  Glucose 70 - 99 mg/dL 93  107  80   BUN 8 - 27 mg/dL 42  26  29   Creatinine 0.76 -  1.27 mg/dL 1.60  1.51  1.67   BUN/Creat Ratio 10 - '24 26   17   '$ Sodium 134 - 144 mmol/L 142  140  141   Potassium 3.5 - 5.2 mmol/L 5.3  4.6  4.7   Chloride 96 - 106 mmol/L 106  103  103   CO2 20 - 29 mmol/L '25  31  27   '$ Calcium 8.6 - 10.2 mg/dL 9.5  9.6  9.7     Current antihypertensive regimen:  Carvedilol 12.5 mg twice daily Losartan 50 mg daily  How often are you checking your Blood Pressure?   Current home BP readings:   What recent interventions/DTPs have been made by any provider to improve Blood Pressure control since last CPP Visit:  No recent interventions  Any recent hospitalizations or ED visits since last visit with CPP?  No recent hospital visits.    What diet changes have been made to improve Blood Pressure Control?  Patient follows Breakfast - patient will have Lunch - patient will have Dinner - patient will have  What exercise is being done to improve your Blood Pressure Control?    Adherence Review: Is the patient currently on ACE/ARB medication? Yes Does the patient have >5 day gap between last estimated fill dates? No  Unable to reach patient after several attempts  Care Gaps: AWV - completed 05/14/2022 Last BP - 112/66 on 07/09/2022 Flu - due  Star Rating Drugs: Atorvastatin 40 mg - last filled 07/05/2022 90 DS at [facility] Losartan 50 mg - last filled 04/19/2022 90 DS at [facility] Pharmacist Assistant [phone]

## 2022-08-18 DIAGNOSIS — I509 Heart failure, unspecified: Secondary | ICD-10-CM | POA: Diagnosis not present

## 2022-08-18 DIAGNOSIS — I110 Hypertensive heart disease with heart failure: Secondary | ICD-10-CM | POA: Diagnosis not present

## 2022-08-18 DIAGNOSIS — H409 Unspecified glaucoma: Secondary | ICD-10-CM | POA: Diagnosis not present

## 2022-08-18 DIAGNOSIS — N529 Male erectile dysfunction, unspecified: Secondary | ICD-10-CM | POA: Diagnosis not present

## 2022-08-18 DIAGNOSIS — Z823 Family history of stroke: Secondary | ICD-10-CM | POA: Diagnosis not present

## 2022-08-18 DIAGNOSIS — Z604 Social exclusion and rejection: Secondary | ICD-10-CM | POA: Diagnosis not present

## 2022-08-18 DIAGNOSIS — I739 Peripheral vascular disease, unspecified: Secondary | ICD-10-CM | POA: Diagnosis not present

## 2022-08-18 DIAGNOSIS — R69 Illness, unspecified: Secondary | ICD-10-CM | POA: Diagnosis not present

## 2022-08-18 DIAGNOSIS — N393 Stress incontinence (female) (male): Secondary | ICD-10-CM | POA: Diagnosis not present

## 2022-08-18 DIAGNOSIS — E785 Hyperlipidemia, unspecified: Secondary | ICD-10-CM | POA: Diagnosis not present

## 2022-08-18 DIAGNOSIS — I25119 Atherosclerotic heart disease of native coronary artery with unspecified angina pectoris: Secondary | ICD-10-CM | POA: Diagnosis not present

## 2022-08-23 ENCOUNTER — Ambulatory Visit (INDEPENDENT_AMBULATORY_CARE_PROVIDER_SITE_OTHER): Payer: Medicare HMO | Admitting: Family Medicine

## 2022-08-23 ENCOUNTER — Telehealth: Payer: Self-pay | Admitting: Family Medicine

## 2022-08-23 ENCOUNTER — Other Ambulatory Visit: Payer: Self-pay | Admitting: Family Medicine

## 2022-08-23 ENCOUNTER — Other Ambulatory Visit: Payer: Self-pay | Admitting: Cardiovascular Disease

## 2022-08-23 ENCOUNTER — Encounter: Payer: Self-pay | Admitting: Family Medicine

## 2022-08-23 VITALS — BP 110/60 | HR 65 | Temp 97.4°F | Ht 66.0 in | Wt 127.4 lb

## 2022-08-23 DIAGNOSIS — R4189 Other symptoms and signs involving cognitive functions and awareness: Secondary | ICD-10-CM

## 2022-08-23 DIAGNOSIS — F4321 Adjustment disorder with depressed mood: Secondary | ICD-10-CM

## 2022-08-23 DIAGNOSIS — R634 Abnormal weight loss: Secondary | ICD-10-CM | POA: Diagnosis not present

## 2022-08-23 MED ORDER — DONEPEZIL HCL 5 MG PO TABS: 5.0000 mg | ORAL_TABLET | Freq: Every day | ORAL | 0 refills | Status: DC

## 2022-08-23 NOTE — Telephone Encounter (Signed)
Pt concerned about price of  donepezil (ARICEPT) 5 MG tablet would like to discuss whether he really needs this medication or a substitution that is more cost effective

## 2022-08-23 NOTE — Telephone Encounter (Signed)
Patient informed of the message and expressed understanding  

## 2022-08-23 NOTE — Patient Instructions (Addendum)
Start the Aricept 5 mg one at night  Set up one month follow up.    Consider nutrition supplement such as Boost or Ensure.

## 2022-08-23 NOTE — Progress Notes (Unsigned)
Established Patient Office Visit  Subjective   Patient ID: [name], male    DOB: 07/30/1936  Age: 86 y.o. MRN: [id number]  Chief Complaint  Patient presents with   Follow-up    HPI  {History (Optional):23778} Here for 63-monthmedical follow-up.  He has history of CAD, hypertension, GERD, osteoarthritis, history of prostate cancer, history of B12 deficiency, and cognitive impairment.  He had MMSE within the past year 27/30.  He was here with daughter last visit we discussed possible Aricept but he declined.  He is continue to lose little bit of weight.  His weight is 127 today and has been 133 last visit.  He states his appetite is decent.  He does fix breakfast frequently with bacon and eggs but frequently fixes very simple lunch or supper such as a sandwich.  His daughter had concerns that some of his weight loss was social related to not taking time to fix meals.  Past Medical History:  Diagnosis Date   AAA (abdominal aortic [place]) ([place]    AAA (abdominal aortic aneurysm) ([facility])    4-4 cm intrarenal aaa per [place] 03-28-2019 epic   Benign positional vertigo    Bladder cancer ([facility])    Bladder neck contracture    Coronary artery disease CARDIOLOGIST--  DR NAcie Fredrickson  ED (erectile dysfunction)    GERD (gastroesophageal reflux disease)    H/O hiatal hernia    History of anal fissures    History of colon polyps    History of radiation therapy 08/24/12-10/22/12   prostate 6600cGy/33 sessions--  for biochemical recurrence   Hyperlipidemia    Hypertension    OA (osteoarthritis)    Peripheral vascular disease ([place]    Recurrent prostate adenocarcinoma ([place] first dx 09/2005--  radial prostatectomy-- gleason 4+3=7, pT2c, negative margin   04/2012--  BIOCHEMICAL  RECURRENCE to 0.33 /  SALVAGE IMRT SEPT to  NOV 2013   S/P CABG x 2    09/  2014   S/P dilatation of esophageal stricture    1992   Sciatica    both hips at times mostly left hip   Sigmoid diverticulosis    SUI  (stress urinary incontinence), male    Urethral stricture    Wears glasses    Past Surgical History:  Procedure Laterality Date   CARDIAC CATHETERIZATION  08-27-2013  DR JMartinique  CRITICAL OSTIAL LM STENOSIS/  diffuse disease LAD 40-50%  &   pLCx 30-40%/   CARDIOVASCULAR STRESS TEST  08-18-2013  DR NASHER   SMALL/ MEDIUM AREA MILD SCAR INFEROSEPTAL WALL/ NO ISCHEMIA/  LOW RISK SCAN/  EF 62%/  LV WALL MOTION WITH SLIGHT DECREASED OF THE SEPTUM   CORONARY ANGIOPLASTY  1991   PTCA  of LEFT CFX   CORONARY ARTERY BYPASS GRAFT N/A 08/27/2013   Procedure: CORONARY ARTERY BYPASS GRAFTING (CABG) times two using left internal mammary and right saphenous vein using endoscope.;  Surgeon: SMelrose Nakayama MD;  [place]  Service: Open Heart Surgery;  Laterality: N/A;   CYSTOSCOPY WITH URETHRAL DILATATION N/A 01/26/2014   Procedure: CYSTOSCOPY WITH URETHRAL DILATATION, BLADDER BIOPSY, FOLEY CATHETER;  Surgeon: DMolli Hazard MD;  [facility];  Service: Urology;  Laterality: N/A;   INGUINAL HERNIA REPAIR Bilateral 1994   KNEE ARTHROSCOPY Right yrs ago   LEFT HEART CATHETERIZATION WITH CORONARY ANGIOGRAM N/A 08/27/2013   Procedure: LEFT HEART CATHETERIZATION WITH CORONARY ANGIOGRAM;  Surgeon: Peter M JMartinique MD;  [facility];  Service: Cardiovascular;  Laterality: N/A;   LUMBAR DISC SURGERY  1980'S   RADICAL RETROPUBIC PROSTATECTOMY W/ BILATERAL PELVIC NODE DISSECTION  11-18-2005   TRANSURETHRAL INCISION OF PROSTATE N/A 11/01/2020   Procedure: TRANSURETHRAL INCISION OF THE BLADDER NECK;  Surgeon: Ardis Hughs, MD;  Location: [facility];  Service: Urology;  Laterality: N/A;   URETHROTOMY N/A 05/30/2014   Procedure: CYSTOSCOPY  BALLOON DILATION AND DIRECT VISION INTERNAL URETHROTOMY;  Surgeon: Sharyn Creamer, MD;  Location[facility];  Service: Urology;  Laterality: N/A;    reports that he quit smoking about 27 years ago. His smoking  use included cigarettes. He has a 20.00 pack-year smoking history. He has never used smokeless tobacco. He reports current alcohol use. He reports that he does not use drugs. family history includes Arthritis in an other family member; Cancer in his brother and father; Hyperlipidemia in an other family member; Hypertension in an other family member. Allergies  Allergen Reactions   Bee Venom Other (See Comments)    Reaction swelling   Amoxicillin Rash   Hydrocodone Other (See Comments)    REACTION: disorientation    Review of Systems  Constitutional:  Negative for chills and fever.  Respiratory:  Negative for cough and shortness of breath.   Cardiovascular:  Negative for chest pain.  Gastrointestinal:  Negative for abdominal pain.  Genitourinary:  Negative for dysuria.  Neurological:  Negative for dizziness, focal weakness and headaches.      Objective:     BP 110/60 (BP Location: Left Arm, Patient Position: Sitting, Cuff Size: Normal)   Pulse 65   Temp (!) 97.4 F (36.3 C) (Oral)   Ht '5\' 6"'$  (1.676 m)   Wt 127 lb 6.4 oz (57.8 kg)   SpO2 98%   BMI 20.56 kg/m  {Vitals History (Optional):23777}  Physical Exam Constitutional:      Appearance: He is well-developed.  HENT:     Right Ear: External ear normal.     Left Ear: External ear normal.  Eyes:     Pupils: Pupils are equal, round, and reactive to light.  Neck:     Thyroid: No thyromegaly.  Cardiovascular:     Rate and Rhythm: Normal rate and regular rhythm.  Pulmonary:     Effort: Pulmonary effort is normal. No respiratory distress.     Breath sounds: Normal breath sounds. No wheezing or rales.  Musculoskeletal:     Cervical back: Neck supple.  Neurological:     Mental Status: He is alert and oriented to person, place, and time.  Psychiatric:     Comments: MMSE 24/30      No results found for any visits on 08/23/22.  {Labs (Optional):23779}  The ASCVD Risk score (Arnett DK, et al., 2019) failed to  calculate for the following reasons:   The 2019 ASCVD risk score is only valid for ages 65 to 62    Assessment & Plan:   #1 patient has history of cognitive impairment.  Suspect he probably does have some early dementia.  We had previously discussed neuropsychological assessment and also possible MRI.  Did have an MRI 5 years ago per neurology.  We also discussed Aricept 5 mg once daily and reassess in 1 month and consider titration to 10 mg at that time if tolerating well  #2 weight loss.  We suspect some of this at least if not mostly a social.  Have made suggestions for calorie supplements such as boost or Ensure.  Will discuss further with daughter.   Return in about 1 month (around 09/22/2022).    [name], MD

## 2022-09-04 ENCOUNTER — Telehealth: Payer: Self-pay | Admitting: Family Medicine

## 2022-09-04 NOTE — Telephone Encounter (Signed)
Noted.   He might be a candidate for Namenda.  Will discuss at follow up.

## 2022-09-04 NOTE — Telephone Encounter (Signed)
Pt called to inform MD that he will no longer take the following medicaton:  donepezil (ARICEPT) 5 MG tablet  This is because Pt states he has been having horrible nightmares.  Pt was asked if he wanted CMA to call him back for advice.  Pt declined and also went ahead and cancelled his appt for 09/23/22.

## 2022-09-05 ENCOUNTER — Other Ambulatory Visit: Payer: Self-pay | Admitting: Cardiovascular Disease

## 2022-09-14 ENCOUNTER — Other Ambulatory Visit: Payer: Self-pay | Admitting: Family Medicine

## 2022-09-23 ENCOUNTER — Ambulatory Visit: Payer: Medicare HMO | Admitting: Family Medicine

## 2022-10-22 ENCOUNTER — Telehealth: Payer: Self-pay | Admitting: Pharmacist

## 2022-10-22 NOTE — Chronic Care Management (AMB) (Signed)
Chronic Care Management Pharmacy Assistant   Name: [name]  MRN: [id number] DOB: 07-18-1936  Reason for Encounter: Disease State / Hypertension Assessment Call   Recent office visits:  08/23/2022 [name] MD - Patient was seen for cognitive impairment and an additional concern. Started Donepezil 5 mg at bedtime. Follow up in 1 month.   Recent consult visits:  None  Hospital visits:  None  Medications: Outpatient Encounter Medications as of 10/22/2022  Medication Sig   acetaminophen (TYLENOL) 500 MG tablet Take 500 mg by mouth every 6 (six) hours as needed.   aspirin 81 MG EC tablet Take 1 tablet (81 mg total) by mouth daily.   atorvastatin (LIPITOR) 40 MG tablet TAKE 1 TABLET BY MOUTH EVERY DAY   carvedilol (COREG) 12.5 MG tablet TAKE 1 TABLET BY MOUTH TWICE A DAY   Cyanocobalamin (VITAMIN B12) 1000 MCG TBCR Take 100 mcg by mouth daily.   donepezil (ARICEPT) 5 MG tablet Take 1 tablet (5 mg total) by mouth at bedtime.   famotidine (PEPCID) 20 MG tablet Take 20 mg by mouth as needed for heartburn or indigestion.   furosemide (LASIX) 20 MG tablet Take 1 tablet (20 mg total) by mouth 3 (three) times a week. On Monday, Wednesday, Friday   latanoprost (XALATAN) 0.005 % ophthalmic solution Place 1 drop into both eyes at bedtime.    losartan (COZAAR) 50 MG tablet TAKE 1 TABLET BY MOUTH EVERY DAY   Multiple Vitamin (MULTIVITAMIN WITH MINERALS) TABS tablet Take 1 tablet by mouth daily.   nitroGLYCERIN (NITROSTAT) 0.4 MG SL tablet PLACE 1 TABLET UNDER THE TONUE EVERY 5 MINUTES AS NEEDED FOR CHEST PAIN.   polyethylene glycol (MIRALAX / GLYCOLAX) packet Take 17 g by mouth daily as needed (for constipation).   sertraline (ZOLOFT) 50 MG tablet TAKE 1 TABLET BY MOUTH EVERY DAY   No facility-administered encounter medications on file as of 10/22/2022.  Fill History:  Dispensed Days Supply Quantity Provider Pharmacy  SERTRALINE HCL 50 MG TABLET 10/13/2022 90 90 each       Dispensed Days Supply Quantity Provider Pharmacy  NITROGLYCERIN 0.4 MG TABLET SL 08/01/2021 30 25 each      Dispensed Days Supply Quantity Provider Pharmacy  LOSARTAN POTASSIUM 50 MG TAB 10/12/2022 90 90 each      Dispensed Days Supply Quantity Provider Pharmacy  LATANOPROST 0.005% EYE DROPS 10/14/2022 90 10 mL      Dispensed Days Supply Quantity Provider Pharmacy  FUROSEMIDE 20 MG TABLET 10/07/2022 84 36 each      Dispensed Days Supply Quantity Provider Pharmacy  DONEPEZIL HCL 5 MG TABLET 08/23/2022 30 30 each      Dispensed Days Supply Quantity Provider Pharmacy  CARVEDILOL 12.'5MG'$  TAB 09/05/2022 90 180 tablet      Dispensed Days Supply Quantity Provider Pharmacy  ATORVASTATIN '40MG'$  TAB 09/06/2022 90 90 tablet     Reviewed chart prior to disease state call. Spoke with patient regarding BP  Recent Office Vitals: BP Readings from Last 3 Encounters:  08/23/22 110/60  07/09/22 112/66  05/22/22 112/62   Pulse Readings from Last 3 Encounters:  08/23/22 65  07/09/22 60  05/22/22 60    Wt Readings from Last 3 Encounters:  08/23/22 127 lb 6.4 oz (57.8 kg)  07/09/22 129 lb (58.5 kg)  05/22/22 133 lb (60.3 kg)     Kidney Function Lab Results  Component Value Date/Time   CREATININE 1.60 (H) 07/09/2022 11:53 AM   CREATININE 1.51 (H) 05/03/2022 12:00  PM   CREATININE 1.55 (H) 01/15/2016 08:50 AM   GFR 42.22 (L) 05/03/2022 12:00 PM   GFRNONAA 39 (L) 03/15/2021 10:34 AM   GFRAA 56 (L) 01/24/2021 07:32 AM       Latest Ref Rng & Units 07/09/2022   11:53 AM 05/03/2022   12:00 PM 07/31/2021   10:44 AM  BMP  Glucose 70 - 99 mg/dL 93  107  80   BUN 8 - 27 mg/dL 42  26  29   Creatinine 0.76 - 1.27 mg/dL 1.60  1.51  1.67   BUN/Creat Ratio 10 - '24 26   17   '$ Sodium 134 - 144 mmol/L 142  140  141   Potassium 3.5 - 5.2 mmol/L 5.3  4.6  4.7   Chloride 96 - 106 mmol/L 106  103  103   CO2 20 - 29 mmol/L '25  31  27   '$ Calcium 8.6 - 10.2 mg/dL 9.5  9.6  9.7     Current antihypertensive  regimen:  Carvedilol 12.5 mg twice daily Losartan 50 mg daily  How often are you checking your Blood Pressure? Patient states he hasn't been checking his blood pressures, he states he will try to remember to do this at least once weekly.   Current home BP readings: Patient is not currently checking blood pressures   What recent interventions/DTPs have been made by any provider to improve Blood Pressure control since last CPP Visit:  No recent interventions   Any recent hospitalizations or ED visits since last visit with CPP?  No recent hospital visits.   What diet changes have been made to improve Blood Pressure Control?  Patient follows Breakfast - patient will have cereal or an egg Lunch - patient will have a sandwich Dinner - patient generally doesn't eat dinner unless he is hungry.   What exercise is being done to improve your Blood Pressure Control?  Patient states he is doing his own yard and house work.   Adherence Review: Is the patient currently on ACE/ARB medication? Yes Does the patient have >5 day gap between last estimated fill dates? No  Care Gaps: AWV - completed 05/14/2022 Last BP - 110/60 on 08/23/2022 Flu - due Covid - overdue  Star Rating Drugs: Atorvastatin 40 mg - last filled 09/06/2022 90 DS at [facility] Losartan 50 mg - last filled 10/12/2022 90 DS at [facility] Pharmacist Assistant [phone]

## 2022-11-14 DIAGNOSIS — H5213 Myopia, bilateral: Secondary | ICD-10-CM | POA: Diagnosis not present

## 2022-11-14 DIAGNOSIS — Z961 Presence of intraocular lens: Secondary | ICD-10-CM | POA: Diagnosis not present

## 2022-11-14 DIAGNOSIS — H524 Presbyopia: Secondary | ICD-10-CM | POA: Diagnosis not present

## 2022-11-14 DIAGNOSIS — H18513 Endothelial corneal dystrophy, bilateral: Secondary | ICD-10-CM | POA: Diagnosis not present

## 2022-11-14 DIAGNOSIS — H401131 Primary open-angle glaucoma, bilateral, mild stage: Secondary | ICD-10-CM | POA: Diagnosis not present

## 2022-11-21 ENCOUNTER — Other Ambulatory Visit: Payer: Self-pay | Admitting: Family Medicine

## 2022-11-21 DIAGNOSIS — I2510 Atherosclerotic heart disease of native coronary artery without angina pectoris: Secondary | ICD-10-CM

## 2022-11-28 ENCOUNTER — Telehealth: Payer: Self-pay

## 2022-11-28 NOTE — Telephone Encounter (Signed)
--  Caller states has blood in stool this am but not this pm. Yesterday urine with blood x 1. Takes stool softener.  11/27/2022 3:56:19 PM See PCP within 24 Hours Coley, RN, Threasa Beards  Comments User: Erik Obey, RN Date/Time Eilene Ghazi Time): 11/27/2022 3:52:13 PM 140 down to 127# in a year  Referrals REFERRED TO PCP OFFICE  Pt has appt with PCP on 12/03/22

## 2022-12-03 ENCOUNTER — Ambulatory Visit (INDEPENDENT_AMBULATORY_CARE_PROVIDER_SITE_OTHER): Payer: Medicare HMO | Admitting: Family Medicine

## 2022-12-03 ENCOUNTER — Encounter: Payer: Self-pay | Admitting: Family Medicine

## 2022-12-03 VITALS — BP 136/60 | HR 56 | Temp 97.6°F | Ht 66.0 in | Wt 130.9 lb

## 2022-12-03 DIAGNOSIS — K921 Melena: Secondary | ICD-10-CM

## 2022-12-03 DIAGNOSIS — K5900 Constipation, unspecified: Secondary | ICD-10-CM

## 2022-12-03 NOTE — Progress Notes (Signed)
Established Patient Office Visit  Subjective   Patient ID: [name], male    DOB: 1936-07-05  Age: 87 y.o. MRN: [id number]  Chief Complaint  Patient presents with   Constipation    Patient complains of constipation,    Rectal Bleeding    Patient complains of rectal bleeding, x   Hematuria    Patient complains of hematuria,    HPI   [name] is seen today with complaints of intermittent blood per rectum.  He is a very poor historian and has history of some cognitive impairment.  He had difficulty describing how long he has noted blood intermittently.  He had mentioned to someone that he was having hematuria but did not confirm this with further questioning.  He does relate occasional blood with wiping.  Last colonoscopy 2014 unremarkable.  Denies any pain with bowel movements.  Occasional constipation but no regular straining.  He states that he in general is not eating well.  Does not take time to prepare any foods.  Currently eats a lot of high starch foods.  Not eating a lot of fruits and vegetables.  Weight is actually up 3 pounds from September though.  His other medical problems include history of CAD, hypertension, osteoarthritis, chronic kidney disease, remote history of prostate cancer, history of B12 deficiency on chronic replacement  Past Medical History:  Diagnosis Date   AAA (abdominal aortic aneurysm) ([place])    AAA (abdominal aortic aneurysm) ([facility])    4-4 cm intrarenal aaa per US aorta 03-28-2019 epic   Benign positional vertigo    Bladder cancer ([facility])    Bladder neck contracture    Coronary artery disease CARDIOLOGIST--  DR Acie Fredrickson   ED (erectile dysfunction)    GERD (gastroesophageal reflux disease)    H/O hiatal hernia    History of anal fissures    History of colon polyps    History of radiation therapy 08/24/12-10/22/12   prostate 6600cGy/33 sessions--  for biochemical recurrence   Hyperlipidemia    Hypertension    OA (osteoarthritis)    Peripheral  vascular disease ([place])    Recurrent prostate adenocarcinoma ([place]) first dx 09/2005--  radial prostatectomy-- gleason 4+3=7, pT2c, negative margin   04/2012--  BIOCHEMICAL  RECURRENCE to 0.33 /  SALVAGE IMRT SEPT to  NOV 2013   S/P CABG x 2    09/  2014   S/P dilatation of esophageal stricture    1992   Sciatica    both hips at times mostly left hip   Sigmoid diverticulosis    SUI (stress urinary incontinence), male    Urethral stricture    Wears glasses    Past Surgical History:  Procedure Laterality Date   CARDIAC CATHETERIZATION  08-27-2013  DR Martinique   CRITICAL OSTIAL LM STENOSIS/  diffuse disease LAD 40-50%  &   pLCx 30-40%/   CARDIOVASCULAR STRESS TEST  08-18-2013  DR NASHER   SMALL/ MEDIUM AREA MILD SCAR INFEROSEPTAL WALL/ NO ISCHEMIA/  LOW RISK SCAN/  EF 62%/  LV WALL MOTION WITH SLIGHT DECREASED OF THE SEPTUM   CORONARY ANGIOPLASTY  1991   PTCA  of LEFT CFX   CORONARY ARTERY BYPASS GRAFT N/A 08/27/2013   Procedure: CORONARY ARTERY BYPASS GRAFTING (CABG) times two using left internal mammary and right saphenous vein using endoscope.;  Surgeon: Melrose Nakayama, MD;  Location: [facility];  Service: Open Heart Surgery;  Laterality: N/A;   CYSTOSCOPY WITH URETHRAL DILATATION N/A 01/26/2014   Procedure: CYSTOSCOPY  WITH URETHRAL DILATATION, BLADDER BIOPSY, FOLEY CATHETER;  Surgeon: Molli Hazard, MD;  Location: [facility];  Service: Urology;  Laterality: N/A;   INGUINAL HERNIA REPAIR Bilateral 1994   KNEE ARTHROSCOPY Right yrs ago   LEFT HEART CATHETERIZATION WITH CORONARY ANGIOGRAM N/A 08/27/2013   Procedure: LEFT HEART CATHETERIZATION WITH CORONARY ANGIOGRAM;  Surgeon: Peter M Martinique, MD;  Location: [facility];  Service: Cardiovascular;  Laterality: N/A;   LUMBAR DISC SURGERY  1980'S   RADICAL RETROPUBIC PROSTATECTOMY W/ BILATERAL PELVIC NODE DISSECTION  11-18-2005   TRANSURETHRAL INCISION OF PROSTATE N/A 11/01/2020   Procedure: TRANSURETHRAL INCISION OF THE BLADDER NECK;   Surgeon: Ardis Hughs, MD;  Location: [facility];  Service: Urology;  Laterality: N/A;   URETHROTOMY N/A 05/30/2014   Procedure: CYSTOSCOPY  BALLOON DILATION AND DIRECT VISION INTERNAL URETHROTOMY;  Surgeon: Sharyn Creamer, MD;  Location: [facility];  Service: Urology;  Laterality: N/A;    reports that he quit smoking about 28 years ago. His smoking use included cigarettes. He has a 20.00 pack-year smoking history. He has never used smokeless tobacco. He reports current alcohol use. He reports that he does not use drugs. family history includes Arthritis in an other family member; Cancer in his brother and father; Hyperlipidemia in an other family member; Hypertension in an other family member. Allergies  Allergen Reactions   Bee Venom Other (See Comments)    Reaction swelling   Amoxicillin Rash   Hydrocodone Other (See Comments)    REACTION: disorientation    Review of Systems  Constitutional:  Negative for chills and fever.  Cardiovascular:  Negative for chest pain.  Gastrointestinal:  Negative for abdominal pain, constipation, diarrhea, melena, nausea and vomiting.  Genitourinary:  Negative for dysuria.      Objective:     BP 136/60 (BP Location: Left Arm, Patient Position: Sitting, Cuff Size: Normal)   Pulse (!) 56   Temp 97.6 F (36.4 C) (Oral)   Ht '5\' 6"'$  (1.676 m)   Wt 130 lb 14.4 oz (59.4 kg)   SpO2 99%   BMI 21.13 kg/m  BP Readings from Last 3 Encounters:  12/03/22 136/60  08/23/22 110/60  07/09/22 112/66   Wt Readings from Last 3 Encounters:  12/03/22 130 lb 14.4 oz (59.4 kg)  08/23/22 127 lb 6.4 oz (57.8 kg)  07/09/22 129 lb (58.5 kg)      Physical Exam Vitals reviewed.  Constitutional:      Appearance: Normal appearance.  Cardiovascular:     Rate and Rhythm: Normal rate and regular rhythm.  Pulmonary:     Effort: Pulmonary effort is normal.     Breath sounds: Normal breath sounds.  Genitourinary:    Comments:  Full exam reveals small skin tag around the anal region.  No visible anal fissure.  Digital exam reveals no rectal mass.  Brown heme-negative stool. Neurological:     Mental Status: He is alert.      No results found for any visits on 12/03/22.    The ASCVD Risk score (Arnett DK, et al., 2019) failed to calculate for the following reasons:   The 2019 ASCVD risk score is only valid for ages 65 to 58    Assessment & Plan:   Problem List Items Addressed This Visit   None Visit Diagnoses     Hematochezia    -  Primary   Relevant Orders   CBC with Differential/Platelet     Patient reports hematochezia and  very vague with regard to details.  Digital exam is unremarkable with Hemoccult negative stool. -Check CBC -Recommend increasing fiber intake to at least 25 to 30 g daily. -Has generally poor diet and apparently eating several processed foods daily.  Never cooks.  We recommend trying to increase his intake of whole foods trying to cut down on processed starchy foods  Return in about 1 month (around 01/03/2023).    [name], MD

## 2022-12-04 ENCOUNTER — Telehealth: Payer: Self-pay | Admitting: Family Medicine

## 2022-12-04 LAB — CBC WITH DIFFERENTIAL/PLATELET
Basophils Absolute: 0.1 10*3/uL (ref 0.0–0.1)
Basophils Relative: 1.2 % (ref 0.0–3.0)
Eosinophils Absolute: 0.4 10*3/uL (ref 0.0–0.7)
Eosinophils Relative: 7.4 % — ABNORMAL HIGH (ref 0.0–5.0)
HCT: 34.2 % — ABNORMAL LOW (ref 39.0–52.0)
Hemoglobin: 11.6 g/dL — ABNORMAL LOW (ref 13.0–17.0)
Lymphocytes Relative: 24.9 % (ref 12.0–46.0)
Lymphs Abs: 1.3 10*3/uL (ref 0.7–4.0)
MCHC: 33.9 g/dL (ref 30.0–36.0)
MCV: 108.8 fl — ABNORMAL HIGH (ref 78.0–100.0)
Monocytes Absolute: 0.6 10*3/uL (ref 0.1–1.0)
Monocytes Relative: 11.9 % (ref 3.0–12.0)
Neutro Abs: 2.9 10*3/uL (ref 1.4–7.7)
Neutrophils Relative %: 54.6 % (ref 43.0–77.0)
Platelets: 109 10*3/uL — ABNORMAL LOW (ref 150.0–400.0)
RBC: 3.14 Mil/uL — ABNORMAL LOW (ref 4.22–5.81)
RDW: 13.1 % (ref 11.5–15.5)
WBC: 5.3 10*3/uL (ref 4.0–10.5)

## 2022-12-04 NOTE — Telephone Encounter (Signed)
Patient left a [facility] test result for Dr. Elease Hashimoto to look at--he is requesting a call back.

## 2022-12-05 NOTE — Telephone Encounter (Signed)
Noted.   Will  review when back in office.  [name] MD [facility] at [facility]

## 2022-12-10 ENCOUNTER — Other Ambulatory Visit: Payer: Self-pay | Admitting: Family Medicine

## 2022-12-10 DIAGNOSIS — I714 Abdominal aortic aneurysm, without rupture, unspecified: Secondary | ICD-10-CM

## 2022-12-10 NOTE — Progress Notes (Signed)
Let patient know that I have recommended referral to vein and vascular surgery for follow-up regarding abdominal aortic aneurysm.  He had recent [facility] screening with aortic aneurysm of 5.0 cm which was increased from 4.6 cm from ultrasound back on 05-15-2022.  I have already placed referral.  Bruce Post MD [facility] at [facility]

## 2022-12-11 ENCOUNTER — Telehealth: Payer: Self-pay | Admitting: Family Medicine

## 2022-12-11 NOTE — Telephone Encounter (Signed)
Pt called, asking to speak to CMA. CMA was unavailable. Pt stated he needed more info regarding a procedure MD is referring him to and wants a call back to discuss.

## 2022-12-11 NOTE — Telephone Encounter (Signed)
I spoke with the patient and provided clarification regarding imaging test ordered by PCP

## 2022-12-19 ENCOUNTER — Encounter: Payer: Self-pay | Admitting: Vascular Surgery

## 2022-12-19 ENCOUNTER — Ambulatory Visit: Payer: Medicare HMO | Admitting: Vascular Surgery

## 2022-12-19 ENCOUNTER — Ambulatory Visit (INDEPENDENT_AMBULATORY_CARE_PROVIDER_SITE_OTHER)
Admission: RE | Admit: 2022-12-19 | Discharge: 2022-12-19 | Disposition: A | Discharge status: Home / Self Care | Payer: Medicare HMO | Source: Ambulatory Visit | Attending: Vascular Surgery | Admitting: Vascular Surgery

## 2022-12-19 ENCOUNTER — Ambulatory Visit (HOSPITAL_COMMUNITY)
Admission: RE | Admit: 2022-12-19 | Discharge: 2022-12-19 | Disposition: A | Discharge status: Home / Self Care | Payer: Medicare HMO | Source: Ambulatory Visit | Attending: Vascular Surgery | Admitting: Vascular Surgery

## 2022-12-19 VITALS — BP 158/70 | HR 56 | Temp 98.1°F | Resp 20 | Ht 66.0 in | Wt 127.0 lb

## 2022-12-19 DIAGNOSIS — I7143 Infrarenal abdominal aortic aneurysm, without rupture: Secondary | ICD-10-CM

## 2022-12-19 DIAGNOSIS — I739 Peripheral vascular disease, unspecified: Secondary | ICD-10-CM | POA: Diagnosis not present

## 2022-12-19 LAB — VAS US ABI WITH/WO TBI
Left ABI: 1.09
Right ABI: 0.82

## 2022-12-19 NOTE — Progress Notes (Signed)
REASON FOR VISIT:   Follow-up of abdominal aortic aneurysm and peripheral arterial disease.  MEDICAL ISSUES:   4.7 CM INFRARENAL ABDOMINAL AORTIC ANEURYSM: His aneurysm has increased only slightly in size over the last 9 months.  I explained that we would not consider elective repair and a normal risk patient unless it reached 5.5 cm in maximum diameter.  He is not a smoker and his blood pressure is under good control.  I have ordered a follow-up duplex scan in 9 months and we will see him back at that time.  I have explained to him that I will be retiring so he will be seen on the PA schedule.  RIGHT COMMON ILIAC ARTERY STENOSIS: Patient does have a right common iliac artery stenosis.  However he is asymptomatic.  I would not recommend an aggressive approach to this unless he developed disabling claudication or rest pain.  He is on aspirin and is on a statin.  HPI:   REEGAN Roberts is a pleasant 87 y.o. male who I last saw on 01/31/2022.  At that time he had a stable 4.5 cm infrarenal abdominal aortic aneurysm.  I recommended a follow-up visit in 9 months.  In addition he has a right common iliac artery stenosis based on his duplex.  I felt that we should get baseline ABIs when he came in for this visit.  Since I saw him last he denies any claudication, rest pain, or nonhealing ulcers.  He denies any significant abdominal pain.  He has mild chronic back pain.  He quit smoking in 1996.  His blood pressures been under good control.  He does have stage III chronic kidney disease.  Past Medical History:  Diagnosis Date   AAA (abdominal aortic aneurysm) ([place])    AAA (abdominal aortic aneurysm) ([facility])    4-4 cm intrarenal aaa per US aorta 03-28-2019 epic   Benign positional vertigo    Bladder cancer ([facility])    Bladder neck contracture    Coronary artery disease CARDIOLOGIST--  DR Acie Fredrickson   ED (erectile dysfunction)    GERD (gastroesophageal reflux disease)    H/O hiatal hernia     History of anal fissures    History of colon polyps    History of radiation therapy 08/24/12-10/22/12   prostate 6600cGy/33 sessions--  for biochemical recurrence   Hyperlipidemia    Hypertension    OA (osteoarthritis)    Peripheral vascular disease ([place])    Recurrent prostate adenocarcinoma ([place]) first dx 09/2005--  radial prostatectomy-- gleason 4+3=7, pT2c, negative margin   04/2012--  BIOCHEMICAL  RECURRENCE to 0.33 /  SALVAGE IMRT SEPT to  NOV 2013   S/P CABG x 2    09/  2014   S/P dilatation of esophageal stricture    1992   Sciatica    both hips at times mostly left hip   Sigmoid diverticulosis    SUI (stress urinary incontinence), male    Urethral stricture    Wears glasses     Family History  Problem Relation Age of Onset   Cancer Father        prostate died 73 something   Cancer Brother        prostate cancer   Arthritis Other    Hyperlipidemia Other    Hypertension Other     SOCIAL HISTORY: Social History   Tobacco Use   Smoking status: Former    Packs/day: 2.00    Years: 10.00    Total pack  years: 20.00    Types: Cigarettes    Quit date: 12/03/1994    Years since quitting: 28.0   Smokeless tobacco: Never  Substance Use Topics   Alcohol use: Yes    Comment: occasionally    Allergies  Allergen Reactions   Bee Venom Other (See Comments)    Reaction swelling   Amoxicillin Rash   Hydrocodone Other (See Comments)    REACTION: disorientation    Current Outpatient Medications  Medication Sig Dispense Refill   acetaminophen (TYLENOL) 500 MG tablet Take 500 mg by mouth every 6 (six) hours as needed.     aspirin 81 MG EC tablet Take 1 tablet (81 mg total) by mouth daily.     atorvastatin (LIPITOR) 40 MG tablet TAKE 1 TABLET BY MOUTH EVERY DAY 90 tablet 0   carvedilol (COREG) 12.5 MG tablet TAKE 1 TABLET BY MOUTH TWICE A DAY 180 tablet 3   Cyanocobalamin (VITAMIN B12) 1000 MCG TBCR Take 100 mcg by mouth daily.     famotidine (PEPCID) 20 MG tablet Take 20  mg by mouth as needed for heartburn or indigestion.     furosemide (LASIX) 20 MG tablet Take 1 tablet (20 mg total) by mouth 3 (three) times a week. On Monday, Wednesday, Friday 45 tablet 3   latanoprost (XALATAN) 0.005 % ophthalmic solution Place 1 drop into both eyes at bedtime.      losartan (COZAAR) 50 MG tablet TAKE 1 TABLET BY MOUTH EVERY DAY 90 tablet 2   Multiple Vitamin (MULTIVITAMIN WITH MINERALS) TABS tablet Take 1 tablet by mouth daily.     nitroGLYCERIN (NITROSTAT) 0.4 MG SL tablet PLACE 1 TABLET UNDER THE TONUE EVERY 5 MINUTES AS NEEDED FOR CHEST PAIN. 75 tablet 3   polyethylene glycol (MIRALAX / GLYCOLAX) packet Take 17 g by mouth daily as needed (for constipation).     sertraline (ZOLOFT) 50 MG tablet TAKE 1 TABLET BY MOUTH EVERY DAY 90 tablet 3   donepezil (ARICEPT) 5 MG tablet Take 1 tablet (5 mg total) by mouth at bedtime. (Patient not taking: Reported on 12/19/2022) 30 tablet 0   No current facility-administered medications for this visit.    REVIEW OF SYSTEMS:  '[X]'$  denotes positive finding, '[ ]'$  denotes negative finding Cardiac  Comments:  Chest pain or chest pressure:    Shortness of breath upon exertion:    Short of breath when lying flat:    Irregular heart rhythm:        Vascular    Pain in calf, thigh, or hip brought on by ambulation:    Pain in feet at night that wakes you up from your sleep:     Blood clot in your veins:    Leg swelling:         Pulmonary    Oxygen at home:    Productive cough:     Wheezing:         Neurologic    Sudden weakness in arms or legs:     Sudden numbness in arms or legs:     Sudden onset of difficulty speaking or slurred speech:    Temporary loss of vision in one eye:     Problems with dizziness:         Gastrointestinal    Blood in stool:     Vomited blood:         Genitourinary    Burning when urinating:     Blood in urine:  Psychiatric    Major depression:         Hematologic    Bleeding problems:     Problems with blood clotting too easily:        Skin    Rashes or ulcers:        Constitutional    Fever or chills:     PHYSICAL EXAM:   Vitals:   12/19/22 0907  BP: (!) 158/70  Pulse: (!) 56  Resp: 20  Temp: 98.1 F (36.7 C)  SpO2: 99%  Weight: 127 lb (57.6 kg)  Height: '5\' 6"'$  (1.676 m)    GENERAL: The patient is a well-nourished male, in no acute distress. The vital signs are documented above. CARDIAC: There is a regular rate and rhythm.  VASCULAR: I do not detect carotid bruits. On the right side he has a slightly diminished femoral pulse.  He has a diminished posterior tibial pulse. On the left side he has a palpable femoral pulse and posterior tibial pulse. He has no significant lower extremity swelling. PULMONARY: There is good air exchange bilaterally without wheezing or rales. ABDOMEN: Soft and non-tender with normal pitched bowel sounds.  His aneurysm is palpable and nontender. MUSCULOSKELETAL: There are no major deformities or cyanosis. NEUROLOGIC: No focal weakness or paresthesias are detected. SKIN: There are no ulcers or rashes noted. PSYCHIATRIC: The patient has a normal affect.  DATA:    ARTERIAL DOPPLER STUDY: I have independently interpreted his arterial Doppler study today.  On the right side there is a triphasic posterior tibial signal.  There is no dorsalis pedis signal.  ABIs 82%.  Toe pressures 82 mmHg.  On the left side there is a triphasic posterior tibial signal.  The dorsalis pedis signal is absent.  ABIs 100%.  Toe pressures 115 mmHg.  DUPLEX ABDOMINAL AORTA: I have independently interpreted his duplex of the abdominal aorta.  The maximum diameter of his infrarenal aorta is 4.7 cm.  In February 2023 it was 4.5 cm in maximum diameter.  Thus there has been only slight increase in size.  The right common iliac artery measures 1.5 cm in maximum diameter.  The left common iliac artery measures 1.5 cm in maximum diameter.  Of note there is a stenosis  noted in the right common iliac artery .  Deitra Mayo [facility] [phone]

## 2023-01-14 NOTE — Progress Notes (Signed)
Care Management & Coordination Services Pharmacy Note  01/14/2023 Name:  [name] MRN:  [id number] DOB:  13-Jun-1936  Summary: -C/o of dizziness when bending over, counseled on changing positions slowly and regular home BP monitoring  Recommendations/Changes made from today's visit: -Continue medication therapy -Monitor BP at home daily and keep a log, notify office of any concerns  Follow up plan: BP review call in 3 months Pharmacist visit in 6 months   Subjective: [name] is an 87 y.o. year old male who is a primary patient of Burchette, Alinda Sierras, MD.  The care coordination team was consulted for assistance with disease management and care coordination needs.    Engaged with patient by telephone for follow up visit.  Recent office visits: 12/03/22 [name], MD - For hematochezia. CBC ordered,no med changes  09/04/22 [name], MD - Phone call, patient notified PCP stopping donepezil due to nightmares  08/23/2022 [name] MD - Patient was seen for cognitive impairment and an additional concern. Started Donepezil 5 mg at bedtime. Follow up in 1 month.   Recent consult visits: 12/19/22 Angelia Mould (Vascular & Vein) - For f/u of AAA. No med changes 11/14/22 Sharyne Peach (Ophthalmology) - No visit details  Hospital visits: None in previous 6 months   Objective:  Lab Results  Component Value Date   CREATININE 1.60 (H) 07/09/2022   BUN 42 (H) 07/09/2022   GFR 42.22 (L) 05/03/2022   EGFR 42 (L) 07/09/2022   GFRNONAA 39 (L) 03/15/2021   GFRAA 56 (L) 01/24/2021   NA 142 07/09/2022   K 5.3 (H) 07/09/2022   CALCIUM 9.5 07/09/2022   CO2 25 07/09/2022   GLUCOSE 93 07/09/2022    Lab Results  Component Value Date/Time   GFR 42.22 (L) 05/03/2022 12:00 PM   GFR 47.34 (L) 02/01/2020 12:00 PM    Last diabetic Eye exam: No results found for: "HMDIABEYEEXA"  Last diabetic Foot exam: No results found for: "HMDIABFOOTEX"   Lab Results   Component Value Date   CHOL 118 07/09/2022   HDL 51 07/09/2022   LDLCALC 52 07/09/2022   TRIG 74 07/09/2022   CHOLHDL 2.3 07/09/2022       Latest Ref Rng & Units 07/09/2022   11:53 AM 07/31/2021   10:44 AM 03/15/2021   10:34 AM  Hepatic Function  Total Protein 6.5 - 8.1 g/dL   6.2   Albumin 3.5 - 5.0 g/dL   3.7   AST 15 - 41 U/L   27   ALT 0 - 44 IU/L 17  17  27   $ Alk Phosphatase 38 - 126 U/L   53   Total Bilirubin 0.3 - 1.2 mg/dL   0.6     Lab Results  Component Value Date/Time   TSH 1.37 05/03/2022 12:00 PM   TSH 1.45 03/28/2021 10:41 AM       Latest Ref Rng & Units 12/03/2022    4:10 PM 03/15/2021   10:34 AM 03/06/2021   10:12 AM  CBC  WBC 4.0 - 10.5 K/uL 5.3  5.5  4.8   Hemoglobin 13.0 - 17.0 g/dL 11.6  11.9  11.8   Hematocrit 39.0 - 52.0 % 34.2  36.3  34.6   Platelets 150.0 - 400.0 K/uL 109.0  139  123.0     Lab Results  Component Value Date/Time   VITAMINB12 1,483 (H) 05/03/2022 12:00 PM   VITAMINB12 947 (H) 03/28/2021 10:41 AM    Clinical ASCVD: No  The ASCVD Risk score (Arnett DK, et al., 2019) failed to calculate for the following reasons:   The 2019 ASCVD risk score is only valid for ages 11 to 49        12/03/2022    4:22 PM 05/14/2022   11:48 AM 01/22/2022    2:57 PM  Depression screen PHQ 2/9  Decreased Interest 0 1 0  Down, Depressed, Hopeless 0 0 0  PHQ - 2 Score 0 1 0  Altered sleeping   0  Tired, decreased energy   0  Change in appetite   0  Feeling bad or failure about yourself    0  Trouble concentrating   0  Moving slowly or fidgety/restless   0  Suicidal thoughts   0  PHQ-9 Score   0     Social History   Tobacco Use  Smoking Status Former   Packs/day: 2.00   Years: 10.00   Total pack years: 20.00   Types: Cigarettes   Quit date: 12/03/1994   Years since quitting: 28.1  Smokeless Tobacco Never   BP Readings from Last 3 Encounters:  12/19/22 (!) 158/70  12/03/22 136/60  08/23/22 110/60   Pulse Readings from Last 3  Encounters:  12/19/22 (!) 56  12/03/22 (!) 56  08/23/22 65   Wt Readings from Last 3 Encounters:  12/19/22 127 lb (57.6 kg)  12/03/22 130 lb 14.4 oz (59.4 kg)  08/23/22 127 lb 6.4 oz (57.8 kg)   BMI Readings from Last 3 Encounters:  12/19/22 20.50 kg/m  12/03/22 21.13 kg/m  08/23/22 20.56 kg/m    Allergies  Allergen Reactions   Bee Venom Other (See Comments)    Reaction swelling   Amoxicillin Rash   Hydrocodone Other (See Comments)    REACTION: disorientation    Medications Reviewed Today     Reviewed by Angelia Mould, MD (Physician) on 12/19/22 at [place] List Status: <None>   Medication Order Taking? Sig Documenting Provider Last Dose Status Informant  acetaminophen (TYLENOL) 500 MG tablet [id number] Yes Take 500 mg by mouth every 6 (six) hours as needed. [provider] Taking Active   aspirin 81 MG EC tablet [id number] Yes Take 1 tablet (81 mg total) by mouth daily. Nahser, Wonda Cheng, MD Taking Active Self  atorvastatin (LIPITOR) 40 MG tablet [id number] Yes TAKE 1 TABLET BY MOUTH EVERY DAY Burchette, Alinda Sierras, MD Taking Active   carvedilol (COREG) 12.5 MG tablet [id number] Yes TAKE 1 TABLET BY MOUTH TWICE A DAY Nahser, Wonda Cheng, MD Taking Active   Cyanocobalamin (VITAMIN B12) 1000 MCG TBCR [id number] Yes Take 100 mcg by mouth daily. [provider] Taking Active   donepezil (ARICEPT) 5 MG tablet [id number] No Take 1 tablet (5 mg total) by mouth at bedtime.  Patient not taking: Reported on 12/19/2022   [name], MD Not Taking Active   famotidine (PEPCID) 20 MG tablet [id number] Yes Take 20 mg by mouth as needed for heartburn or indigestion. [provider] Taking Active   furosemide (LASIX) 20 MG tablet [id number] Yes Take 1 tablet (20 mg total) by mouth 3 (three) times a week. On Monday, Wednesday, Friday Nahser, Wonda Cheng, MD Taking Active   latanoprost (XALATAN) 0.005 % ophthalmic solution [id number] Yes Place 1 drop into both eyes  at bedtime.  [provider] Taking Active Self  losartan (COZAAR) 50 MG tablet [id number] Yes TAKE 1 TABLET BY MOUTH EVERY DAY Nahser, Wonda Cheng, MD Taking Active  Multiple Vitamin (MULTIVITAMIN WITH MINERALS) TABS tablet [id number] Yes Take 1 tablet by mouth daily. [provider] Taking Active Self  nitroGLYCERIN (NITROSTAT) 0.4 MG SL tablet [id number] Yes PLACE 1 TABLET UNDER THE TONUE EVERY 5 MINUTES AS NEEDED FOR CHEST PAIN. Nahser, Wonda Cheng, MD Taking Active   polyethylene glycol [facility] / Floria Raveling) packet [id number] Yes Take 17 g by mouth daily as needed (for constipation). [provider] Taking Active            Med Note Ovidio Kin Dec 20, 2020 11:05 AM)    sertraline (ZOLOFT) 50 MG tablet [id number] Yes TAKE 1 TABLET BY MOUTH EVERY DAY Burchette, Alinda Sierras, MD Taking Active             SDOH:  (Social Determinants of Health) assessments and interventions performed: Yes SDOH Interventions    Flowsheet Row Chronic Care Management from 09/19/2021 in [place] at [place] from 06/24/2018 in [place] at [place]  SDOH Interventions    Transportation Interventions Intervention Not Indicated --  Depression Interventions/Treatment  -- Medication  [discussing counseling ]  Financial Strain Interventions Intervention Not Indicated --       Medication Assistance: None required.  Patient affirms current coverage meets needs.  Medication Access: Within the past 30 days, how often has patient missed a dose of medication? None Is a pillbox or other method used to improve adherence? Yes  Factors that may affect medication adherence?  Memory impairment Are meds synced by current pharmacy? No  Are meds delivered by current pharmacy? No  Does patient experience delays in picking up medications due to transportation concerns? No   Upstream Services Reviewed: Is patient disadvantaged to use  [facility] Pharmacy?: Yes  Current Rx insurance plan: [employer] Name and location of Current pharmacy:  [facility] [place], [place] - [address] [place] [address] Phone: [phone] Fax: [phone]  [id number] [address]: [phone]: [phone] [facility] Pharm[facility] services reviewed with patient today?: No  Patient requests to transfer care to [facility] Pharmacy?: No  Reason patient declined to change pharmacies: Disadvantaged due to insurance/mail order  Compliance/Adherence/Medication fill history: Care Gaps: COVID vaccine  Star-Rating Drugs: Atorvastatin 423mPDC 92% Losartan 5047mDC 100%   Assessment/Plan   Hypertension (BP goal <140/90) -Not ideally controlled -Current treatment: Furosemide 47m56m/week Appropriate, Effective, Safe, Accessible Losartan 50mg75md Appropriate, Effective, Safe, Accessible Carvedilol 12.5mg B43mAppropriate, Effective, Safe, Accessible -Medications previously tried: Amlodipine , Metoprolol -Current home readings: Not checking -Current dietary habits: Mindful of salt -Current exercise habits: Finds things to do in the house throughout the day    Dizzy when he bends over -Reports hypotensive/hypertensive symptoms -Educated on BP goals and benefits of medications for prevention of heart attack, stroke and kidney damage; Daily salt intake goal < 2300 mg; Exercise goal of 150 minutes per week; Importance of home blood pressure monitoring; Proper BP monitoring technique; Symptoms of hypotension and importance of maintaining adequate hydration; -Counseled to monitor BP at home daily, document, and provide log at future appointments -Recommended to continue current medication Counseled on slow movements and not changing positions too quickly to avoid dizziness  CAD (Goal: Prevent  ASCVD and bleed) -Controlled -Current treatment  Aspirin 81mg 141mAppropriate, Effective, Safe, Accessible Atorvastatin 40mg 1 61mppropriate, Effective, Safe, Accessible  Nitrostat 0.45m Appropriate, Effective, Safe, Accessible -Medications previously tried: None  -Denies any signs of a bleed or episodes of chest pain -Recommended to continue current medication  [place] FellsPharmacist [phone]

## 2023-01-21 ENCOUNTER — Telehealth: Payer: Self-pay

## 2023-01-21 NOTE — Progress Notes (Signed)
Care Management & Coordination Services Pharmacy Team  Reason for Encounter: Appointment Reminder  Contacted patient to confirm telephone appointment with Burman Riis, PharmD on 01/22/2023 at 10:15. Unsuccessful outreach. Left voicemail for patient to return call.  Do you have any problems getting your medications?  If yes what types of problems are you experiencing?   What is your top health concern you would like to discuss at your upcoming visit?   Have you seen any other providers since your last visit with PCP?   Care Gaps: AWV - completed 05/14/2022 Covid - overdue Flu - postponed   Star Rating Drugs: Atorvastatin 40 mg - [facility] Losartan 50 mg - last filled 01/12/2023 90 DS at [place] Pharmacist Assistant [phone]

## 2023-01-22 ENCOUNTER — Ambulatory Visit: Payer: Medicare HMO

## 2023-03-05 ENCOUNTER — Other Ambulatory Visit: Payer: Self-pay | Admitting: Family Medicine

## 2023-03-05 DIAGNOSIS — I2510 Atherosclerotic heart disease of native coronary artery without angina pectoris: Secondary | ICD-10-CM

## 2023-03-29 ENCOUNTER — Other Ambulatory Visit: Payer: Self-pay | Admitting: Family Medicine

## 2023-03-29 DIAGNOSIS — I2510 Atherosclerotic heart disease of native coronary artery without angina pectoris: Secondary | ICD-10-CM

## 2023-04-03 ENCOUNTER — Other Ambulatory Visit: Payer: Self-pay | Admitting: Family Medicine

## 2023-04-16 ENCOUNTER — Telehealth: Payer: Self-pay

## 2023-04-16 NOTE — Progress Notes (Signed)
Care Management & Coordination Services Pharmacy Team  Reason for Encounter: Hypertension  Contacted patient to discuss hypertension disease state. Spoke with patient on 04/16/2023    Current antihypertensive regimen:  Carvedilol 12.5 mg twice daily Losartan 50 mg daily Patient verbally confirms he is taking the above medications as directed. Yes  How often are you checking your Blood Pressure? Patient is not checking his blood pressures at home, he states he does not have a blood pressure monitor at home.   Wrist or arm cuff: Patient does not have a blood pressure monitor  OTC medications including pseudoephedrine or NSAIDs?  What recent interventions/DTPs have been made by any provider to improve Blood Pressure control since last CPP Visit: No recent interventions  Any recent hospitalizations or ED visits since last visit with CPP? No recent hospital visits  What diet changes have been made to improve Blood Pressure Control?  Patient follows no specific type of diet Breakfast - cereal or an egg Lunch - a sandwich Dinner - patient generally doesn't eat dinner unless he is hungry.  Caffeine intake: no caffeine beverages Salt intake:does not add salt  What exercise is being done to improve your Blood Pressure Control?  Patient states he stays busy doing house work and other chores around the house.   Adherence Review: Is the patient currently on ACE/ARB medication? Yes Does the patient have >5 day gap between last estimated fill dates? No  Care Gaps: AWV - completed 05/14/2022 Covid - overdue   Star Rating Drugs: Atorvastatin 40 mg - [facility] Losartan 50 mg - [facility]   Chart Updates: Recent office visits:  None  Recent consult visits:  None  Hospital visits:  None  Medications: Outpatient Encounter Medications as of 04/16/2023  Medication Sig   acetaminophen (TYLENOL) 500 MG tablet Take 500 mg by mouth  every 6 (six) hours as needed.   aspirin 81 MG EC tablet Take 1 tablet (81 mg total) by mouth daily.   atorvastatin (LIPITOR) 40 MG tablet TAKE 1 TABLET BY MOUTH EVERY DAY   carvedilol (COREG) 12.5 MG tablet TAKE 1 TABLET BY MOUTH TWICE A DAY   Cyanocobalamin (VITAMIN B12) 1000 MCG TBCR Take 100 mcg by mouth daily.   donepezil (ARICEPT) 5 MG tablet TAKE 1 TABLET BY MOUTH EVERYDAY AT BEDTIME   famotidine (PEPCID) 20 MG tablet Take 20 mg by mouth as needed for heartburn or indigestion.   furosemide (LASIX) 20 MG tablet Take 1 tablet (20 mg total) by mouth 3 (three) times a week. On Monday, Wednesday, Friday   latanoprost (XALATAN) 0.005 % ophthalmic solution Place 1 drop into both eyes at bedtime.    losartan (COZAAR) 50 MG tablet TAKE 1 TABLET BY MOUTH EVERY DAY   Multiple Vitamin (MULTIVITAMIN WITH MINERALS) TABS tablet Take 1 tablet by mouth daily.   nitroGLYCERIN (NITROSTAT) 0.4 MG SL tablet PLACE 1 TABLET UNDER THE TONUE EVERY 5 MINUTES AS NEEDED FOR CHEST PAIN.   polyethylene glycol (MIRALAX / GLYCOLAX) packet Take 17 g by mouth daily as needed (for constipation).   sertraline (ZOLOFT) 50 MG tablet TAKE 1 TABLET BY MOUTH EVERY DAY   No facility-administered encounter medications on file as of 04/16/2023.  Fill History:  Dispensed Days Supply Quantity Provider Pharmacy  ATORVASTATIN 40 MG TABLET 03/31/2023 90 90 each      Dispensed Days Supply Quantity Provider Pharmacy  CARVEDILOL 12.5 MG TABLET 02/27/2023 90 180 each  Dispensed Days Supply Quantity Provider Pharmacy  DONEPEZIL HCL 5 MG TABLET 04/03/2023 30 30 each      Dispensed Days Supply Quantity Provider Pharmacy  FUROSEMIDE 20 MG TABLET 04/06/2023 84 36 each      Dispensed Days Supply Quantity Provider Pharmacy  LATANOPROST .005% OP SOL 04/12/2023 90 10 mL      Dispensed Days Supply Quantity Provider Pharmacy  LOSARTAN POTASSIUM 50 MG TAB 04/11/2023 90 90 each      Dispensed Days Supply Quantity Provider Pharmacy   NITROGLYCERIN 0.4 MG TABLET SL 08/01/2021 30 25 each      Dispensed Days Supply Quantity Provider Pharmacy  SERTRALINE 50MG  TAB 04/12/2023 90 90 tablet     Recent Office Vitals: BP Readings from Last 3 Encounters:  12/19/22 (!) 158/70  12/03/22 136/60  08/23/22 110/60   Pulse Readings from Last 3 Encounters:  12/19/22 (!) 56  12/03/22 (!) 56  08/23/22 65    Wt Readings from Last 3 Encounters:  12/19/22 127 lb (57.6 kg)  12/03/22 130 lb 14.4 oz (59.4 kg)  08/23/22 127 lb 6.4 oz (57.8 kg)     Kidney Function Lab Results  Component Value Date/Time   CREATININE 1.60 (H) 07/09/2022 11:53 AM   CREATININE 1.51 (H) 05/03/2022 12:00 PM   CREATININE 1.55 (H) 01/15/2016 08:50 AM   GFR 42.22 (L) 05/03/2022 12:00 PM   GFRNONAA 39 (L) 03/15/2021 10:34 AM   GFRAA 56 (L) 01/24/2021 07:32 AM       Latest Ref Rng & Units 07/09/2022   11:53 AM 05/03/2022   12:00 PM 07/31/2021   10:44 AM  BMP  Glucose 70 - 99 mg/dL 93  161  80   BUN 8 - 27 mg/dL 42  26  29   Creatinine 0.76 - 1.27 mg/dL 0.96  0.45  4.09   BUN/Creat Ratio 10 - 24 26   17    Sodium 134 - 144 mmol/L 142  140  141   Potassium 3.5 - 5.2 mmol/L 5.3  4.6  4.7   Chloride 96 - 106 mmol/L 106  103  103   CO2 20 - 29 mmol/L 25  31  27    Calcium 8.6 - 10.2 mg/dL 9.5  9.6  9.7    Inetta Fermo [facility]  Clinical Pharmacist Assistant [phone]

## 2023-04-30 ENCOUNTER — Encounter: Payer: Self-pay | Admitting: Adult Health

## 2023-04-30 ENCOUNTER — Ambulatory Visit (INDEPENDENT_AMBULATORY_CARE_PROVIDER_SITE_OTHER): Payer: Medicare HMO | Admitting: Adult Health

## 2023-04-30 VITALS — BP 128/82 | HR 53 | Temp 97.9°F | Ht 66.0 in | Wt 128.8 lb

## 2023-04-30 DIAGNOSIS — M79605 Pain in left leg: Secondary | ICD-10-CM | POA: Diagnosis not present

## 2023-04-30 DIAGNOSIS — M79604 Pain in right leg: Secondary | ICD-10-CM | POA: Diagnosis not present

## 2023-04-30 NOTE — Progress Notes (Signed)
Subjective:    Patient ID: [name], male    DOB: [birth year], 87 y.o.   MRN: [id number]  Leg Pain    87 year old male who  has a past medical history of AAA (abdominal aortic aneurysm) (HCC), AAA (abdominal aortic aneurysm) (HCC), Benign positional vertigo, Bladder cancer (HCC), Bladder neck contracture, Coronary artery disease (CARDIOLOGIST--  DR Elease Hashimoto), ED (erectile dysfunction), GERD (gastroesophageal reflux disease), H/O hiatal hernia, History of anal fissures, History of colon polyps, History of radiation therapy (08/24/12-10/22/12), Hyperlipidemia, Hypertension, OA (osteoarthritis), Peripheral vascular disease (HCC), Recurrent prostate adenocarcinoma (HCC) (first dx 09/2005--  radial prostatectomy-- gleason 4+3=7, pT2c, negative margin), S/P CABG x 2, S/P dilatation of esophageal stricture, Sciatica, Sigmoid diverticulosis, SUI (stress urinary incontinence), male, Urethral stricture, and Wears glasses.  He is a patient of Dr. Caryl Never who I am seeing today for an acute issue. He reports that over the last two weeks he has had intermittent lower extremity pain ( two times)  Reports pain is present at night when he is laying down. Pain is less severe when he wakes up in the morning. He is unable to describe the pain. Pain is all over the legs. Pain lasts for 5-10 minutes.   He has not had any trauma or injury. Denies saddle anesthesia, loss of bowel or bladder.  He does not stay hydrated.   He casually mentions for quite some time when he walks rather long distances or is working in the yard he will get pain in his legs and they will become fatigued.  Review of Systems See HPI   Past Medical History:  Diagnosis Date   AAA (abdominal aortic aneurysm) (HCC)    AAA (abdominal aortic aneurysm) (HCC)    4-4 cm intrarenal aaa per US aorta 03-28-2019 epic   Benign positional vertigo    Bladder cancer (HCC)    Bladder neck contracture    Coronary artery disease CARDIOLOGIST--  DR  Elease Hashimoto   ED (erectile dysfunction)    GERD (gastroesophageal reflux disease)    H/O hiatal hernia    History of anal fissures    History of colon polyps    History of radiation therapy 08/24/12-10/22/12   prostate 6600cGy/33 sessions--  for biochemical recurrence   Hyperlipidemia    Hypertension    OA (osteoarthritis)    Peripheral vascular disease (HCC)    Recurrent prostate adenocarcinoma (HCC) first dx 09/2005--  radial prostatectomy-- gleason 4+3=7, pT2c, negative margin   04/2012--  BIOCHEMICAL  RECURRENCE to 0.33 /  SALVAGE IMRT SEPT to  NOV 2013   S/P CABG x 2    09/  2014   S/P dilatation of esophageal stricture    1992   Sciatica    both hips at times mostly left hip   Sigmoid diverticulosis    SUI (stress urinary incontinence), male    Urethral stricture    Wears glasses     Social History   Socioeconomic History   Marital status: Single    Spouse name: Not on file   Number of children: 2   Years of education: 13   Highest education level: Not on file  Occupational History   Occupation: Retired    Associate Professor: RETIRED  Tobacco Use   Smoking status: Former    Packs/day: 2.00    Years: 10.00    Additional pack years: 0.00    Total pack years: 20.00    Types: Cigarettes    Quit date: 12/03/1994    Years  since quitting: 28.4   Smokeless tobacco: Never  Vaping Use   Vaping Use: Never used  Substance and Sexual Activity   Alcohol use: Yes    Comment: occasionally   Drug use: No   Sexual activity: Not on file  Other Topics Concern   Not on file  Social History Narrative   Lives alone   Caffeine use: Coffee- 3 cups daily   Soda sometimes.    Social Determinants of Health   Financial Resource Strain: Low Risk  (01/22/2023)   Overall Financial Resource Strain (CARDIA)    Difficulty of Paying Living Expenses: Not very hard  Food Insecurity: No Food Insecurity (01/22/2023)   Hunger Vital Sign    Worried About Running Out of Food in the Last Year: Never true     Ran Out of Food in the Last Year: Never true  Transportation Needs: No Transportation Needs (01/22/2023)   PRAPARE - Administrator, Civil Service (Medical): No    Lack of Transportation (Non-Medical): No  Physical Activity: Inactive (05/14/2022)   Exercise Vital Sign    Days of Exercise per Week: 0 days    Minutes of Exercise per Session: 0 min  Stress: No Stress Concern Present (05/14/2022)   [employer] of Occupational Health - Occupational Stress Questionnaire    Feeling of Stress : Not at all  Social Connections: Socially Isolated (05/08/2021)   Social Connection and Isolation Panel [NHANES]    Frequency of Communication with Friends and Family: Twice a week    Frequency of Social Gatherings with Friends and Family: Never    Attends Religious Services: Never    Database administrator or Organizations: No    Attends Banker Meetings: Never    Marital Status: Widowed  Intimate Partner Violence: Not At Risk (05/08/2021)   Humiliation, Afraid, Rape, and Kick questionnaire    Fear of Current or Ex-Partner: No    Emotionally Abused: No    Physically Abused: No    Sexually Abused: No    Past Surgical History:  Procedure Laterality Date   CARDIAC CATHETERIZATION  08-27-2013  DR [place]   CRITICAL OSTIAL LM STENOSIS/  diffuse disease LAD 40-50%  &   pLCx 30-40%/   CARDIOVASCULAR STRESS TEST  08-18-2013  DR NASHER   SMALL/ MEDIUM AREA MILD SCAR INFEROSEPTAL WALL/ NO ISCHEMIA/  LOW RISK SCAN/  EF 62%/  LV WALL MOTION WITH SLIGHT DECREASED OF THE SEPTUM   CORONARY ANGIOPLASTY  1991   PTCA  of LEFT CFX   CORONARY ARTERY BYPASS GRAFT N/A 08/27/2013   Procedure: CORONARY ARTERY BYPASS GRAFTING (CABG) times two using left internal mammary and right saphenous vein using endoscope.;  Surgeon: Loreli Slot, MD;  Location: [facility];  Service: Open Heart Surgery;  Laterality: N/A;   CYSTOSCOPY WITH URETHRAL DILATATION N/A 01/26/2014   Procedure: CYSTOSCOPY WITH  URETHRAL DILATATION, BLADDER BIOPSY, FOLEY CATHETER;  Surgeon: Milford Cage, MD;  Location: [facility];  Service: Urology;  Laterality: N/A;   INGUINAL HERNIA REPAIR Bilateral 1994   KNEE ARTHROSCOPY Right yrs ago   LEFT HEART CATHETERIZATION WITH CORONARY ANGIOGRAM N/A 08/27/2013   Procedure: LEFT HEART CATHETERIZATION WITH CORONARY ANGIOGRAM;  Surgeon: Peter M Sw[place]il[place], MD;  [facility] C[facility] LAB;  Service: Cardiovascular;  Laterality: N/A;   LUMBAR DISC SURGERY  1980'S   RADICAL RETROPUBIC PROSTATECTOMY W/ BILATERAL PELVIC NODE DISSECTION  11-18-2005   TRANSURETHRAL INCISION OF PROSTATE N/A 11/01/2020   Procedure: TRANSURETHRAL INCISION OF  THE BLADDER NECK;  Surgeon: Crist Fat, MD;  Location: [facility];  Service: Urology;  Laterality: N/A;   URETHROTOMY N/A 05/30/2014   Procedure: CYSTOSCOPY  BALLOON DILATION AND DIRECT VISION INTERNAL URETHROTOMY;  Surgeon: Magdalene Molly, MD;  Location: [facility];  Service: Urology;  Laterality: N/A;    Family History  Problem Relation Age of Onset   Cancer Father        prostate died 58 something   Cancer Brother        prostate cancer   Arthritis Other    Hyperlipidemia Other    Hypertension Other     Allergies  Allergen Reactions   Bee Venom Other (See Comments)    Reaction swelling   Amoxicillin Rash   Hydrocodone Other (See Comments)    REACTION: disorientation    Current Outpatient Medications on File Prior to Visit  Medication Sig Dispense Refill   acetaminophen (TYLENOL) 500 MG tablet Take 500 mg by mouth every 6 (six) hours as needed.     aspirin 81 MG EC tablet Take 1 tablet (81 mg total) by mouth daily.     atorvastatin (LIPITOR) 40 MG tablet TAKE 1 TABLET BY MOUTH EVERY DAY 30 tablet 2   carvedilol (COREG) 12.5 MG tablet TAKE 1 TABLET BY MOUTH TWICE A DAY 180 tablet 3   Cyanocobalamin (VITAMIN B12) 1000 MCG TBCR Take 100 mcg by mouth daily.     donepezil (ARICEPT) 5 MG tablet TAKE  1 TABLET BY MOUTH EVERYDAY AT BEDTIME 30 tablet 0   famotidine (PEPCID) 20 MG tablet Take 20 mg by mouth as needed for heartburn or indigestion.     furosemide (LASIX) 20 MG tablet Take 1 tablet (20 mg total) by mouth 3 (three) times a week. On Monday, Wednesday, Friday 45 tablet 3   latanoprost (XALATAN) 0.005 % ophthalmic solution Place 1 drop into both eyes at bedtime.      losartan (COZAAR) 50 MG tablet TAKE 1 TABLET BY MOUTH EVERY DAY 90 tablet 2   Multiple Vitamin (MULTIVITAMIN WITH MINERALS) TABS tablet Take 1 tablet by mouth daily.     nitroGLYCERIN (NITROSTAT) 0.4 MG SL tablet PLACE 1 TABLET UNDER THE TONUE EVERY 5 MINUTES AS NEEDED FOR CHEST PAIN. 75 tablet 3   polyethylene glycol (MIRALAX / GLYCOLAX) packet Take 17 g by mouth daily as needed (for constipation).     sertraline (ZOLOFT) 50 MG tablet TAKE 1 TABLET BY MOUTH EVERY DAY 90 tablet 3   No current facility-administered medications on file prior to visit.    BP 128/82 (BP Location: Left Arm, Patient Position: Sitting, Cuff Size: Normal)   Pulse (!) 53   Temp 97.9 F (36.6 C) (Oral)   Ht 5\' 6"  (1.676 m)   Wt 128 lb 12.8 oz (58.4 kg)   SpO2 99%   BMI 20.79 kg/m       Objective:   Physical Exam Vitals and nursing note reviewed.  Constitutional:      Appearance: Normal appearance.  Cardiovascular:     Rate and Rhythm: Normal rate and regular rhythm.     Pulses: Normal pulses.     Heart sounds: Normal heart sounds.  Pulmonary:     Effort: Pulmonary effort is normal.     Breath sounds: Normal breath sounds.  Musculoskeletal:        General: No swelling or tenderness. Normal range of motion.     Right lower leg: No edema.  Left lower leg: No edema.  Skin:    General: Skin is warm and dry.  Neurological:     General: No focal deficit present.     Mental Status: He is alert and oriented to person, place, and time.  Psychiatric:        Mood and Affect: Mood normal.        Behavior: Behavior normal.         Thought Content: Thought content normal.        Judgment: Judgment normal.        Assessment & Plan:  1. Pain in both lower extremities - Possibly claudication vs cramping form dehydration. No edema noted. No pain with palpation today. Good pedal pulses.  - Will check labs today and order vascular US with ABIs - Encouraged to increase fluids.  - CBC with Differential/Platelet; Future - Comprehensive metabolic panel; Future - US ARTERIAL ABI (SCREENING LOWER EXTREMITY); Future   Shirline Frees, NP

## 2023-04-30 NOTE — Patient Instructions (Addendum)
It was great seeing you today   I am going to check blood work today and I have ordered an ultra sound of both legs to make sure there is not a blockage   Please increase fluids   Follow up with Dr. Caryl Never

## 2023-05-01 LAB — COMPREHENSIVE METABOLIC PANEL
ALT: 19 U/L (ref 0–53)
AST: 20 U/L (ref 0–37)
Albumin: 4.4 g/dL (ref 3.5–5.2)
Alkaline Phosphatase: 64 U/L (ref 39–117)
BUN: 42 mg/dL — ABNORMAL HIGH (ref 6–23)
CO2: 27 mEq/L (ref 19–32)
Calcium: 9.6 mg/dL (ref 8.4–10.5)
Chloride: 104 mEq/L (ref 96–112)
Creatinine, Ser: 2.04 mg/dL — ABNORMAL HIGH (ref 0.40–1.50)
GFR: 29.23 mL/min — ABNORMAL LOW (ref >60.00)
Glucose, Bld: 84 mg/dL (ref 70–99)
Potassium: 5.9 mEq/L — ABNORMAL HIGH (ref 3.5–5.1)
Sodium: 139 mEq/L (ref 135–145)
Total Bilirubin: 0.5 mg/dL (ref 0.2–1.2)
Total Protein: 7.3 g/dL (ref 6.0–8.3)

## 2023-05-01 LAB — CBC WITH DIFFERENTIAL/PLATELET
Basophils Absolute: 0.1 K/uL (ref 0.0–0.1)
Basophils Relative: 1.1 % (ref 0.0–3.0)
Eosinophils Absolute: 0.5 K/uL (ref 0.0–0.7)
Eosinophils Relative: 7.2 % — ABNORMAL HIGH (ref 0.0–5.0)
HCT: 34.4 % — ABNORMAL LOW (ref 39.0–52.0)
Hemoglobin: 11.5 g/dL — ABNORMAL LOW (ref 13.0–17.0)
Lymphocytes Relative: 21.3 % (ref 12.0–46.0)
Lymphs Abs: 1.5 K/uL (ref 0.7–4.0)
MCHC: 33.4 g/dL (ref 30.0–36.0)
MCV: 108.1 fl — ABNORMAL HIGH (ref 78.0–100.0)
Monocytes Absolute: 0.8 K/uL (ref 0.1–1.0)
Monocytes Relative: 11.2 % (ref 3.0–12.0)
Neutro Abs: 4.3 K/uL (ref 1.4–7.7)
Neutrophils Relative %: 59.2 % (ref 43.0–77.0)
Platelets: 123 K/uL — ABNORMAL LOW (ref 150.0–400.0)
RBC: 3.19 Mil/uL — ABNORMAL LOW (ref 4.22–5.81)
RDW: 13.4 % (ref 11.5–15.5)
WBC: 7.2 K/uL (ref 4.0–10.5)

## 2023-05-03 ENCOUNTER — Other Ambulatory Visit: Payer: Self-pay | Admitting: Family Medicine

## 2023-05-11 ENCOUNTER — Other Ambulatory Visit: Payer: Self-pay | Admitting: Cardiovascular Disease

## 2023-05-11 ENCOUNTER — Other Ambulatory Visit: Payer: Self-pay | Admitting: Family Medicine

## 2023-05-11 DIAGNOSIS — I2510 Atherosclerotic heart disease of native coronary artery without angina pectoris: Secondary | ICD-10-CM

## 2023-05-19 ENCOUNTER — Encounter: Payer: Self-pay | Admitting: Family Medicine

## 2023-05-19 ENCOUNTER — Ambulatory Visit (INDEPENDENT_AMBULATORY_CARE_PROVIDER_SITE_OTHER): Payer: Medicare HMO | Admitting: Family Medicine

## 2023-05-19 VITALS — BP 120/60 | HR 55 | Temp 98.5°F | Ht 66.0 in | Wt 129.1 lb

## 2023-05-19 DIAGNOSIS — N183 Chronic kidney disease, stage 3 unspecified: Secondary | ICD-10-CM

## 2023-05-19 DIAGNOSIS — R4189 Other symptoms and signs involving cognitive functions and awareness: Secondary | ICD-10-CM

## 2023-05-19 DIAGNOSIS — I10 Essential (primary) hypertension: Secondary | ICD-10-CM

## 2023-05-19 DIAGNOSIS — M48061 Spinal stenosis, lumbar region without neurogenic claudication: Secondary | ICD-10-CM | POA: Diagnosis not present

## 2023-05-19 DIAGNOSIS — E875 Hyperkalemia: Secondary | ICD-10-CM

## 2023-05-19 DIAGNOSIS — I714 Abdominal aortic aneurysm, without rupture, unspecified: Secondary | ICD-10-CM

## 2023-05-19 DIAGNOSIS — E785 Hyperlipidemia, unspecified: Secondary | ICD-10-CM | POA: Diagnosis not present

## 2023-05-19 LAB — BASIC METABOLIC PANEL
BUN: 43 mg/dL — ABNORMAL HIGH (ref 6–23)
CO2: 29 mEq/L (ref 19–32)
Calcium: 9.6 mg/dL (ref 8.4–10.5)
Chloride: 104 mEq/L (ref 96–112)
Creatinine, Ser: 1.85 mg/dL — ABNORMAL HIGH (ref 0.40–1.50)
GFR: 32.85 mL/min — ABNORMAL LOW (ref >60.00)
Glucose, Bld: 87 mg/dL (ref 70–99)
Potassium: 5.2 mEq/L — ABNORMAL HIGH (ref 3.5–5.1)
Sodium: 139 mEq/L (ref 135–145)

## 2023-05-19 LAB — HEPATIC FUNCTION PANEL
ALT: 19 U/L (ref 0–53)
AST: 20 U/L (ref 0–37)
Albumin: 4.3 g/dL (ref 3.5–5.2)
Alkaline Phosphatase: 58 U/L (ref 39–117)
Bilirubin, Direct: 0.2 mg/dL (ref 0.0–0.3)
Total Bilirubin: 0.7 mg/dL (ref 0.2–1.2)
Total Protein: 7.4 g/dL (ref 6.0–8.3)

## 2023-05-19 LAB — LIPID PANEL
Cholesterol: 99 mg/dL (ref 0–200)
HDL: 42.8 mg/dL (ref >39.00)
LDL Cholesterol: 43 mg/dL (ref 0–99)
NonHDL: 56.07
Total CHOL/HDL Ratio: 2
Triglycerides: 67 mg/dL (ref 0.0–149.0)
VLDL: 13.4 mg/dL (ref 0.0–40.0)

## 2023-05-19 MED ORDER — MEMANTINE HCL 28 X 5 MG & 21 X 10 MG PO TABS: ORAL_TABLET | ORAL | 12 refills | Status: DC

## 2023-05-19 NOTE — Progress Notes (Signed)
Established Patient Office Visit  Subjective   Patient ID: [name], male    DOB: [birth year]  Age: 87 y.o. MRN: [id number]  Chief Complaint  Patient presents with   Annual Exam    HPI   [place] was initially scheduled for a "physical ".  However, he has several medical problems that need to be followed up on and we shifted focus to those.  He has history of cognitive impairment.  He was started several months ago on Aricept but he states he had increased dreams and took himself off that.  He still has concerns regarding short-term memory issues.  We had previously discussed possible trial of Namenda  History of abdominal aortic aneurysm.  Last measurement was around 4.6 cm.  Has been followed in the past by vascular surgery needs to get back into see them.  Denies any recent abdominal pain.  He does have chronic low back pain especially with going down hills.  History of known lumbar stenosis.  Takes Tylenol intermittently.  History of chronic kidney disease.  He had recent labs with somewhat worsening kidney function and potassium 5.9.  He is on medications for hypertension including losartan and carvedilol.  Has taken furosemide 20 mg 3 times weekly in the past.  He does not take any potassium supplement.  Occasional leakage of urine.  Past history of TURP.  No consistent stress incontinence symptoms.  No burning with urination.  Past Medical History:  Diagnosis Date   AAA (abdominal aortic aneurysm) (HCC)    AAA (abdominal aortic aneurysm) (HCC)    4-4 cm intrarenal aaa per US aorta 03-28-2019 epic   Benign positional vertigo    Bladder cancer (HCC)    Bladder neck contracture    Coronary artery disease CARDIOLOGIST--  DR Elease Hashimoto   ED (erectile dysfunction)    GERD (gastroesophageal reflux disease)    H/O hiatal hernia    History of anal fissures    History of colon polyps    History of radiation therapy 08/24/12-10/22/12   prostate 6600cGy/33 sessions--  for  biochemical recurrence   Hyperlipidemia    Hypertension    OA (osteoarthritis)    Peripheral vascular disease (HCC)    Recurrent prostate adenocarcinoma (HCC) first dx 09/2005--  radial prostatectomy-- gleason 4+3=7, pT2c, negative margin   04/2012--  BIOCHEMICAL  RECURRENCE to 0.33 /  SALVAGE IMRT SEPT to  NOV 2013   S/P CABG x 2    09/  2014   S/P dilatation of esophageal stricture    1992   Sciatica    both hips at times mostly left hip   Sigmoid diverticulosis    SUI (stress urinary incontinence), male    Urethral stricture    Wears glasses    Past Surgical History:  Procedure Laterality Date   CARDIAC CATHETERIZATION  08-27-2013  DR [place]   CRITICAL OSTIAL LM STENOSIS/  diffuse disease LAD 40-50%  &   pLCx 30-40%/   CARDIOVASCULAR STRESS TEST  08-18-2013  DR NASHER   SMALL/ MEDIUM AREA MILD SCAR INFEROSEPTAL WALL/ NO ISCHEMIA/  LOW RISK SCAN/  EF 62%/  LV WALL MOTION WITH SLIGHT DECREASED OF THE SEPTUM   CORONARY ANGIOPLASTY  1991   PTCA  of LEFT CFX   CORONARY ARTERY BYPASS GRAFT N/A 08/27/2013   Procedure: CORONARY ARTERY BYPASS GRAFTING (CABG) times two using left internal mammary and right saphenous vein using endoscope.;  Surgeon: Loreli Slot, MD;  Location: [facility];  Service: Open Heart  Surgery;  Laterality: N/A;   CYSTOSCOPY WITH URETHRAL DILATATION N/A 01/26/2014   Procedure: CYSTOSCOPY WITH URETHRAL DILATATION, BLADDER BIOPSY, FOLEY CATHETER;  Surgeon: Milford Cage, MD;  Location: [facility];  Service: Urology;  Laterality: N/A;   INGUINAL HERNIA REPAIR Bilateral 1994   KNEE ARTHROSCOPY Right yrs ago   LEFT HEART CATHETERIZATION WITH CORONARY ANGIOGRAM N/A 08/27/2013   Procedure: LEFT HEART CATHETERIZATION WITH CORONARY ANGIOGRAM;  Surgeon: Peter M Swaziland, MD;  Location: [facility] CATH LAB;  Service: Cardiovascular;  Laterality: N/A;   LUMBAR DISC SURGERY  1980'S   RADICAL RETROPUBIC PROSTATECTOMY W/ BILATERAL PELVIC NODE DISSECTION  11-18-2005   TRANSURETHRAL  INCISION OF PROSTATE N/A 11/01/2020   Procedure: TRANSURETHRAL INCISION OF THE BLADDER NECK;  Surgeon: Crist Fat, MD;  Location: [facility];  Service: Urology;  Laterality: N/A;   URETHROTOMY N/A 05/30/2014   Procedure: CYSTOSCOPY  BALLOON DILATION AND DIRECT VISION INTERNAL URETHROTOMY;  Surgeon: Magdalene Molly, MD;  Location: [facility];  Service: Urology;  Laterality: N/A;    reports that he quit smoking about 28 years ago. His smoking use included cigarettes. He has a 20.00 pack-year smoking history. He has never used smokeless tobacco. He reports current alcohol use. He reports that he does not use drugs. family history includes Arthritis in an other family member; Cancer in his brother and father; Hyperlipidemia in an other family member; Hypertension in an other family member. Allergies  Allergen Reactions   Bee Venom Other (See Comments)    Reaction swelling   Amoxicillin Rash   Hydrocodone Other (See Comments)    REACTION: disorientation      Review of Systems  Constitutional:  Negative for chills, fever and malaise/fatigue.  Eyes:  Negative for blurred vision.  Respiratory:  Negative for shortness of breath.   Cardiovascular:  Negative for chest pain.  Gastrointestinal:  Negative for abdominal pain.  Genitourinary:  Negative for dysuria and hematuria.  Musculoskeletal:  Positive for back pain.  Neurological:  Negative for dizziness, weakness and headaches.      Objective:     BP 120/60 (BP Location: Left Arm, Patient Position: Sitting, Cuff Size: Normal)   Pulse (!) 55   Temp 98.5 F (36.9 C) (Oral)   Ht 5\' 6"  (1.676 m)   Wt 129 lb 1.6 oz (58.6 kg)   SpO2 99%   BMI 20.84 kg/m  BP Readings from Last 3 Encounters:  05/19/23 120/60  04/30/23 128/82  12/19/22 (!) 158/70   Wt Readings from Last 3 Encounters:  05/19/23 129 lb 1.6 oz (58.6 kg)  04/30/23 128 lb 12.8 oz (58.4 kg)  12/19/22 127 lb (57.6 kg)      Physical  Exam Vitals reviewed.  Constitutional:      General: He is not in acute distress.    Appearance: He is well-developed.  Eyes:     Pupils: Pupils are equal, round, and reactive to light.  Neck:     Thyroid: No thyromegaly.  Cardiovascular:     Rate and Rhythm: Normal rate and regular rhythm.  Pulmonary:     Effort: Pulmonary effort is normal. No respiratory distress.     Breath sounds: Normal breath sounds. No wheezing or rales.  Musculoskeletal:     Cervical back: Neck supple.     Right lower leg: No edema.     Left lower leg: No edema.  Neurological:     Mental Status: He is alert and oriented to person, place, and time.  Cranial Nerves: No cranial nerve deficit.     Gait: Gait normal.      No results found for any visits on 05/19/23.    The ASCVD Risk score (Arnett DK, et al., 2019) failed to calculate for the following reasons:   The 2019 ASCVD risk score is only valid for ages 29 to 29    Assessment & Plan:   #1 hypertension stable and at goal.  Continue current medications for now.  Recheck basic metabolic panel and may need adjustment of medications based on that.  Continue low-sodium diet  #2 history of mild cognitive impairment.  Previous reported intolerance with Aricept.  Consider trial of Namenda with starter pack titration given.  Reassess in 1 month  #3 history of hyperkalemia by recent labs with potassium 5.9.  Patient does not think he potassium supplement.  Recheck basic metabolic panel.  Continue change from losartan if remains up  #4 chronic low back pain.  Known lumbar stenosis.  Avoid nonsteroidals.  Continue Tylenol as needed.  #5 hyperlipidemia.  Patient on atorvastatin 40 mg daily.  Due for follow-up lipids.  Recheck lipid and hepatic panel  #6 abdominal aortic aneurysm.  4.6 cm by measurement last year.  Needs follow-up with vein and vascular surgery and will set up repeat referral     Return in about 1 month (around 06/18/2023).     Evelena Peat, MD

## 2023-05-19 NOTE — Patient Instructions (Signed)
Set up one month follow up.   

## 2023-06-03 ENCOUNTER — Encounter: Payer: Self-pay | Admitting: Family Medicine

## 2023-06-03 ENCOUNTER — Ambulatory Visit (INDEPENDENT_AMBULATORY_CARE_PROVIDER_SITE_OTHER): Payer: Medicare HMO | Admitting: Family Medicine

## 2023-06-03 VITALS — BP 138/62 | HR 55 | Temp 97.6°F | Ht 66.0 in | Wt 128.5 lb

## 2023-06-03 DIAGNOSIS — Z961 Presence of intraocular lens: Secondary | ICD-10-CM | POA: Diagnosis not present

## 2023-06-03 DIAGNOSIS — I10 Essential (primary) hypertension: Secondary | ICD-10-CM | POA: Diagnosis not present

## 2023-06-03 DIAGNOSIS — R634 Abnormal weight loss: Secondary | ICD-10-CM | POA: Diagnosis not present

## 2023-06-03 DIAGNOSIS — H401131 Primary open-angle glaucoma, bilateral, mild stage: Secondary | ICD-10-CM | POA: Diagnosis not present

## 2023-06-03 DIAGNOSIS — E875 Hyperkalemia: Secondary | ICD-10-CM

## 2023-06-03 DIAGNOSIS — R4189 Other symptoms and signs involving cognitive functions and awareness: Secondary | ICD-10-CM | POA: Diagnosis not present

## 2023-06-03 LAB — BASIC METABOLIC PANEL
BUN: 29 mg/dL — ABNORMAL HIGH (ref 6–23)
CO2: 28 mEq/L (ref 19–32)
Calcium: 9.3 mg/dL (ref 8.4–10.5)
Chloride: 106 mEq/L (ref 96–112)
Creatinine, Ser: 1.53 mg/dL — ABNORMAL HIGH (ref 0.40–1.50)
GFR: 41.25 mL/min — ABNORMAL LOW (ref >60.00)
Glucose, Bld: 86 mg/dL (ref 70–99)
Potassium: 4.7 mEq/L (ref 3.5–5.1)
Sodium: 141 mEq/L (ref 135–145)

## 2023-06-03 MED ORDER — MEMANTINE HCL 10 MG PO TABS: 10.0000 mg | ORAL_TABLET | Freq: Two times a day (BID) | ORAL | 11 refills | Status: DC

## 2023-06-03 NOTE — Patient Instructions (Signed)
Consider daily calorie supplement such as Boost  Make sure you are eating at least 3 full meals per day.    Set up 3 month follow up.

## 2023-06-03 NOTE — Progress Notes (Signed)
Established Patient Office Visit  Subjective   Patient ID: [name], male    DOB: [birth year]  Age: 87 y.o. MRN: [id number]  Chief Complaint  Patient presents with   Medical Management of Chronic Issues    HPI   [name] is here for follow-up regarding chronic issues.  He has history of abdominal aortic aneurysm, CAD, hypertension, GERD, osteoarthritis, chronic kidney disease, history of prostate cancer, mild cognitive impairment, hyperlipidemia, history of CABG.  He had recent labs with some persistent hyperkalemia.  He had potassium of 5.9 recently and subsequent level of 5.2.  Does not take any potassium supplement.  Had him stop losartan after last labs.  He is not monitoring blood pressures at home.  Our plan was to recheck basic metabolic panel today.  He does not use any salt substitutes.  He has history of cognitive impairment and previous intolerance with Aricept.  We recently started Namenda starter pack and he states he is tolerating that fine without any side effects.  Needs to transition over to 10 mg twice daily at this time.  Still drive some.  He has had some ongoing weight loss and especially difficulty gaining weight.  States appetite is fair.  Usually eats a decent breakfast and lunch are usually not much supper.  Does not like to cook and rarely prepares meals.  Has taken supplements such as boost in the past but not regularly.  Denies any major depression issues currently.  He has abdominal aortic aneurysm and is due for follow-up.  We had referred back to vascular surgery last visit and he has pending appointment.  Was seen by another provider here in my absence and may and arterial Dopplers were ordered after he had 2 days of some lower extremity pain.  He had none whatsoever since then.  In retrospect, he thinks these were more leg cramps anything.  Denies any active claudication symptoms now.  Past Medical History:  Diagnosis Date   AAA (abdominal aortic aneurysm)  (HCC)    AAA (abdominal aortic aneurysm) (HCC)    4-4 cm intrarenal aaa per US aorta 03-28-2019 epic   Benign positional vertigo    Bladder cancer (HCC)    Bladder neck contracture    Coronary artery disease CARDIOLOGIST--  DR Elease Hashimoto   ED (erectile dysfunction)    GERD (gastroesophageal reflux disease)    H/O hiatal hernia    History of anal fissures    History of colon polyps    History of radiation therapy 08/24/12-10/22/12   prostate 6600cGy/33 sessions--  for biochemical recurrence   Hyperlipidemia    Hypertension    OA (osteoarthritis)    Peripheral vascular disease (HCC)    Recurrent prostate adenocarcinoma (HCC) first dx 09/2005--  radial prostatectomy-- gleason 4+3=7, pT2c, negative margin   04/2012--  BIOCHEMICAL  RECURRENCE to 0.33 /  SALVAGE IMRT SEPT to  NOV 2013   S/P CABG x 2    09/  2014   S/P dilatation of esophageal stricture    1992   Sciatica    both hips at times mostly left hip   Sigmoid diverticulosis    SUI (stress urinary incontinence), male    Urethral stricture    Wears glasses    Past Surgical History:  Procedure Laterality Date   CARDIAC CATHETERIZATION  08-27-2013  DR [place]   CRITICAL OSTIAL LM STENOSIS/  diffuse disease LAD 40-50%  &   pLCx 30-40%/   CARDIOVASCULAR STRESS TEST  08-18-2013  DR Melburn Popper  SMALL/ MEDIUM AREA MILD SCAR INFEROSEPTAL WALL/ NO ISCHEMIA/  LOW RISK SCAN/  EF 62%/  LV WALL MOTION WITH SLIGHT DECREASED OF THE SEPTUM   CORONARY ANGIOPLASTY  1991   PTCA  of LEFT CFX   CORONARY ARTERY BYPASS GRAFT N/A 08/27/2013   Procedure: CORONARY ARTERY BYPASS GRAFTING (CABG) times two using left internal mammary and right saphenous vein using endoscope.;  Surgeon: Loreli Slot, MD;  Location: [facility] OR;  Service: Open Heart Surgery;  Laterality: N/A;   CYSTOSCOPY WITH URETHRAL DILATATION N/A 01/26/2014   Procedure: CYSTOSCOPY WITH URETHRAL DILATATION, BLADDER BIOPSY, FOLEY CATHETER;  Surgeon: Milford Cage, MD;  Location: [facility];   Service: Urology;  Laterality: N/A;   INGUINAL HERNIA REPAIR Bilateral 1994   KNEE ARTHROSCOPY Right yrs ago   LEFT HEART CATHETERIZATION WITH CORONARY ANGIOGRAM N/A 08/27/2013   Procedure: LEFT HEART CATHETERIZATION WITH CORONARY ANGIOGRAM;  Surgeon: Peter M Swazil[place], MD;  Location: [facility] CATH LAB;  Service: Cardiovascular;  Laterality: N/A;   LUMBAR DISC SURGERY  1980'S   RADICAL RETROPUBIC PROSTATECTOMY W/ BILATERAL PELVIC NODE DISSECTION  11-18-2005   TRANSURETHRAL INCISION OF PROSTATE N/A 11/01/2020   Procedure: TRANSURETHRAL INCISION OF THE BLADDER NECK;  Surgeon: Crist Fat, MD;  Location: [facility];  Service: Urology;  Laterality: N/A;   URETHROTOMY N/A 05/30/2014   Procedure: CYSTOSCOPY  BALLOON DILATION AND DIRECT VISION INTERNAL URETHROTOMY;  Surgeon: Magdalene Molly, MD;  Location: [facility];  Service: Urology;  Laterality: N/A;    reports that he quit smoking about 28 years ago. His smoking use included cigarettes. He has a 20.00 pack-year smoking history. He has never used smokeless tobacco. He reports current alcohol use. He reports that he does not use drugs. family history includes Arthritis in an other family member; Cancer in his brother and father; Hyperlipidemia in an other family member; Hypertension in an other family member. Allergies  Allergen Reactions   Bee Venom Other (See Comments)    Reaction swelling   Amoxicillin Rash   Hydrocodone Other (See Comments)    REACTION: disorientation    Review of Systems  Constitutional:  Negative for malaise/fatigue.  Eyes:  Negative for blurred vision.  Respiratory:  Negative for shortness of breath.   Cardiovascular:  Negative for chest pain.  Genitourinary:  Negative for dysuria.  Neurological:  Negative for dizziness, weakness and headaches.      Objective:     BP 138/62 (BP Location: Left Arm, Cuff Size: Normal)   Pulse (!) 55   Temp 97.6 F (36.4 C) (Oral)   Ht 5\' 6"   (1.676 m)   Wt 128 lb 8 oz (58.3 kg)   SpO2 98%   BMI 20.74 kg/m  BP Readings from Last 3 Encounters:  06/03/23 138/62  05/19/23 120/60  04/30/23 128/82   Wt Readings from Last 3 Encounters:  06/03/23 128 lb 8 oz (58.3 kg)  05/19/23 129 lb 1.6 oz (58.6 kg)  04/30/23 128 lb 12.8 oz (58.4 kg)      Physical Exam Vitals reviewed.  Constitutional:      Appearance: He is well-developed.  HENT:     Right Ear: External ear normal.     Left Ear: External ear normal.  Eyes:     Pupils: Pupils are equal, round, and reactive to light.  Neck:     Thyroid: No thyromegaly.  Cardiovascular:     Rate and Rhythm: Normal rate and regular rhythm.  Pulmonary:  Effort: Pulmonary effort is normal. No respiratory distress.     Breath sounds: Normal breath sounds. No wheezing or rales.  Musculoskeletal:     Cervical back: Neck supple.     Right lower leg: No edema.     Left lower leg: No edema.  Neurological:     Mental Status: He is alert and oriented to person, place, and time.      No results found for any visits on 06/03/23.  Last metabolic panel Lab Results  Component Value Date   GLUCOSE 87 05/19/2023   NA 139 05/19/2023   K 5.2 No hemolysis seen (H) 05/19/2023   CL 104 05/19/2023   CO2 29 05/19/2023   BUN 43 (H) 05/19/2023   CREATININE 1.85 (H) 05/19/2023   GFR 32.85 (L) 05/19/2023   CALCIUM 9.6 05/19/2023   PROT 7.4 05/19/2023   ALBUMIN 4.3 05/19/2023   LABGLOB 2.4 01/13/2017   AGRATIO 1.9 01/13/2017   BILITOT 0.7 05/19/2023   ALKPHOS 58 05/19/2023   AST 20 05/19/2023   ALT 19 05/19/2023   ANIONGAP 5 03/15/2021   Last lipids Lab Results  Component Value Date   CHOL 99 05/19/2023   HDL 42.80 05/19/2023   LDLCALC 43 05/19/2023   TRIG 67.0 05/19/2023   CHOLHDL 2 05/19/2023      The ASCVD Risk score (Arnett DK, et al., 2019) failed to calculate for the following reasons:   The 2019 ASCVD risk score is only valid for ages 17 to 19    Assessment & Plan:    #1 hyperkalemia by recent labs.  Patient not taking any supplements.  He was taken off losartan recently.  Recheck basic metabolic panel today.  #2 hypertension.  Blood pressure repeat after rest today 138/62.  Continue to observe for now.  If potassium back down off of ARB consider in future if necessary another BP medication other than ACE or ARB  #3 mild cognitive impairment.  Prior intolerance with Aricept.  Recently started on Namenda starter pack and tolerating well.  Namenda 10 mg twice daily-new prescription sent  #4 poor weight gain.  His weight does seem to have stabilized.  He is not eating 3 regular meals.  We have suggested he take some daily boost or Ensure and try to add additional meal or healthy snack if possible   Return in about 3 months (around 09/03/2023).    Evelena Peat, MD

## 2023-06-09 ENCOUNTER — Ambulatory Visit: Admission: RE | Admit: 2023-06-09 | Payer: Medicare HMO | Source: Ambulatory Visit

## 2023-06-09 DIAGNOSIS — M79604 Pain in right leg: Secondary | ICD-10-CM

## 2023-06-09 DIAGNOSIS — M79662 Pain in left lower leg: Secondary | ICD-10-CM | POA: Diagnosis not present

## 2023-06-18 ENCOUNTER — Ambulatory Visit (INDEPENDENT_AMBULATORY_CARE_PROVIDER_SITE_OTHER): Payer: Medicare HMO | Admitting: Family Medicine

## 2023-06-18 ENCOUNTER — Encounter: Payer: Self-pay | Admitting: Family Medicine

## 2023-06-18 VITALS — BP 88/48 | HR 65 | Temp 97.8°F | Ht 66.54 in | Wt 124.5 lb

## 2023-06-18 DIAGNOSIS — N183 Chronic kidney disease, stage 3 unspecified: Secondary | ICD-10-CM | POA: Diagnosis not present

## 2023-06-18 DIAGNOSIS — R42 Dizziness and giddiness: Secondary | ICD-10-CM | POA: Diagnosis not present

## 2023-06-18 DIAGNOSIS — R4189 Other symptoms and signs involving cognitive functions and awareness: Secondary | ICD-10-CM

## 2023-06-18 NOTE — Patient Instructions (Addendum)
STOP the furosemide/Lasix  Make sure NOT taking the Losartan  Increase fluid intake  Consider nutrition supplement such as Boost or Ensure.  Avoid being outside in heat of the day.    Set up 2 week follow up.

## 2023-06-18 NOTE — Progress Notes (Signed)
Established Patient Office Visit  Subjective   Patient ID: [name], male    DOB: [birth year]  Age: 87 y.o. MRN: [id number]  No chief complaint on file.   HPI   [name] is seen today with chief complaint of some dizziness.  He is a very poor historian and seems confused a bit at times.  He relates lightheadedness over the past several days but no syncope.  No chest pains.  No focal weakness.  States he is not drinking much in way of any fluids.  We had strongly advised nutritional supplements such as  Boost or Ensure last visit but he has not started either 1 of those.  Usually only eats 1 or 2 meals per day.  Recent hemoglobin stable 11.5 with chronic elevated MCV.  He had normal B12 and TSH a year ago.  His weight has declined another few pounds.  He does have history of heart failure with reduced ejection fraction.  Takes carvedilol 12.5 mg twice daily.  Had been on losartan but had some hyperkalemia and worsening renal function and is supposed to be off this ---though he could not confirm.  Also takes furosemide 20 mg 3 times weekly on Monday, Wednesday, and Friday but he could not confirm that either.  He has 2 children.  1 lives distantly and 1 lives here in town but he has apparently fairly little interaction  He did not tolerate Aricept.  We then started Namenda and he states he was having some concerns for dizziness when taking that and stopped that about a week ago.  Past Medical History:  Diagnosis Date   AAA (abdominal aortic aneurysm) (HCC)    AAA (abdominal aortic aneurysm) (HCC)    4-4 cm intrarenal aaa per US aorta 03-28-2019 epic   Benign positional vertigo    Bladder cancer (HCC)    Bladder neck contracture    Coronary artery disease CARDIOLOGIST--  DR Elease Hashimoto   ED (erectile dysfunction)    GERD (gastroesophageal reflux disease)    H/O hiatal hernia    History of anal fissures    History of colon polyps    History of radiation therapy 08/24/12-10/22/12    prostate 6600cGy/33 sessions--  for biochemical recurrence   Hyperlipidemia    Hypertension    OA (osteoarthritis)    Peripheral vascular disease (HCC)    Recurrent prostate adenocarcinoma (HCC) first dx 09/2005--  radial prostatectomy-- gleason 4+3=7, pT2c, negative margin   04/2012--  BIOCHEMICAL  RECURRENCE to 0.33 /  SALVAGE IMRT SEPT to  NOV 2013   S/P CABG x 2    09/  2014   S/P dilatation of esophageal stricture    1992   Sciatica    both hips at times mostly left hip   Sigmoid diverticulosis    SUI (stress urinary incontinence), male    Urethral stricture    Wears glasses    Past Surgical History:  Procedure Laterality Date   CARDIAC CATHETERIZATION  08-27-2013  DR [place]   CRITICAL OSTIAL LM STENOSIS/  diffuse disease LAD 40-50%  &   pLCx 30-40%/   CARDIOVASCULAR STRESS TEST  08-18-2013  DR NASHER   SMALL/ MEDIUM AREA MILD SCAR INFEROSEPTAL WALL/ NO ISCHEMIA/  LOW RISK SCAN/  EF 62%/  LV WALL MOTION WITH SLIGHT DECREASED OF THE SEPTUM   CORONARY ANGIOPLASTY  1991   PTCA  of LEFT CFX   CORONARY ARTERY BYPASS GRAFT N/A 08/27/2013   Procedure: CORONARY ARTERY BYPASS GRAFTING (CABG)  times two using left internal mammary and right saphenous vein using endoscope.;  Surgeon: Loreli Slot, MD;  Location: [facility] OR;  Service: Open Heart Surgery;  Laterality: N/A;   CYSTOSCOPY WITH URETHRAL DILATATION N/A 01/26/2014   Procedure: CYSTOSCOPY WITH URETHRAL DILATATION, BLADDER BIOPSY, FOLEY CATHETER;  Surgeon: Milford Cage, MD;  Location: [facility];  Service: Urology;  Laterality: N/A;   INGUINAL HERNIA REPAIR Bilateral 1994   KNEE ARTHROSCOPY Right yrs ago   LEFT HEART CATHETERIZATION WITH CORONARY ANGIOGRAM N/A 08/27/2013   Procedure: LEFT HEART CATHETERIZATION WITH CORONARY ANGIOGRAM;  Surgeon: Peter M Swaziland, MD;  Location: [facility] CATH LAB;  Service: Cardiovascular;  Laterality: N/A;   LUMBAR DISC SURGERY  1980'S   RADICAL RETROPUBIC PROSTATECTOMY W/ BILATERAL PELVIC NODE  DISSECTION  11-18-2005   TRANSURETHRAL INCISION OF PROSTATE N/A 11/01/2020   Procedure: TRANSURETHRAL INCISION OF THE BLADDER NECK;  Surgeon: Crist Fat, MD;  Location: [facility];  Service: Urology;  Laterality: N/A;   URETHROTOMY N/A 05/30/2014   Procedure: CYSTOSCOPY  BALLOON DILATION AND DIRECT VISION INTERNAL URETHROTOMY;  Surgeon: Magdalene Molly, MD;  Location: [facility];  Service: Urology;  Laterality: N/A;    reports that he quit smoking about 28 years ago. His smoking use included cigarettes. He started smoking about 38 years ago. He has a 20 pack-year smoking history. He has never used smokeless tobacco. He reports current alcohol use. He reports that he does not use drugs. family history includes Arthritis in an other family member; Cancer in his brother and father; Hyperlipidemia in an other family member; Hypertension in an other family member. Allergies  Allergen Reactions   Bee Venom Other (See Comments)    Reaction swelling   Amoxicillin Rash   Hydrocodone Other (See Comments)    REACTION: disorientation    Review of Systems  Constitutional:  Positive for weight loss. Negative for chills and fever.  Respiratory:  Negative for cough and shortness of breath.   Cardiovascular:  Negative for chest pain.  Gastrointestinal:  Positive for constipation. Negative for abdominal pain.  Genitourinary:  Negative for dysuria.  Neurological:  Positive for dizziness. Negative for speech change, focal weakness, seizures and loss of consciousness.      Objective:     BP (!) 88/48 (BP Location: Left Arm, Cuff Size: Normal)   Pulse 65   Temp 97.8 F (36.6 C) (Oral)   Ht 5' 6.54" (1.69 m)   Wt 124 lb 8 oz (56.5 kg)   SpO2 100%   BMI 19.77 kg/m  BP Readings from Last 3 Encounters:  06/18/23 (!) 88/48  06/03/23 138/62  05/19/23 120/60   Wt Readings from Last 3 Encounters:  06/18/23 124 lb 8 oz (56.5 kg)  06/03/23 128 lb 8 oz (58.3 kg)   05/19/23 129 lb 1.6 oz (58.6 kg)      Physical Exam Vitals reviewed.  Constitutional:      General: He is not in acute distress.    Appearance: He is not ill-appearing.  Cardiovascular:     Rate and Rhythm: Normal rate and regular rhythm.  Pulmonary:     Effort: Pulmonary effort is normal.     Breath sounds: Normal breath sounds. No wheezing or rales.  Musculoskeletal:     Right lower leg: No edema.     Left lower leg: No edema.  Neurological:     General: No focal deficit present.     Mental Status: He is alert.  No results found for any visits on 06/18/23.  Last CBC Lab Results  Component Value Date   WBC 7.2 04/30/2023   HGB 11.5 (L) 04/30/2023   HCT 34.4 (L) 04/30/2023   MCV 108.1 (H) 04/30/2023   MCH 35.8 (H) 03/15/2021   RDW 13.4 04/30/2023   PLT 123.0 (L) 04/30/2023   Last metabolic panel Lab Results  Component Value Date   GLUCOSE 86 06/03/2023   NA 141 06/03/2023   K 4.7 06/03/2023   CL 106 06/03/2023   CO2 28 06/03/2023   BUN 29 (H) 06/03/2023   CREATININE 1.53 (H) 06/03/2023   GFR 41.25 (L) 06/03/2023   CALCIUM 9.3 06/03/2023   PROT 7.4 05/19/2023   ALBUMIN 4.3 05/19/2023   LABGLOB 2.4 01/13/2017   AGRATIO 1.9 01/13/2017   BILITOT 0.7 05/19/2023   ALKPHOS 58 05/19/2023   AST 20 05/19/2023   ALT 19 05/19/2023   ANIONGAP 5 03/15/2021   Last lipids Lab Results  Component Value Date   CHOL 99 05/19/2023   HDL 42.80 05/19/2023   LDLCALC 43 05/19/2023   TRIG 67.0 05/19/2023   CHOLHDL 2 05/19/2023   Last hemoglobin A1c No results found for: "HGBA1C" Last thyroid functions Lab Results  Component Value Date   TSH 1.37 05/03/2022      The ASCVD Risk score (Arnett DK, et al., 2019) failed to calculate for the following reasons:   The 2019 ASCVD risk score is only valid for ages 48 to 72    Assessment & Plan:   #1 dizziness.  He complains of lightheadedness worse with standing.  Suspect related to hypotension.  Blood pressure  today left arm seated 96/50 and standing 88/48. -We have asked that he confirm that he is not taking losartan and continue to hold -Hold furosemide which he takes 3 times weekly -Increase hydration -Change positions slowly -Reassess in 2 weeks  #2 cognitive impairment.  Patient seems very confused at times today- especially regarding medications.  May need family advocate or someone else to help assist with medications and will have to look into this.  He reportedly did not tolerate Aricept or Namenda.  #3 weight loss.  This is gradual and ongoing.  May be social component.  He does not cook very often.  Only eating 1-2 meals per day.  Again strongly advised nutrition supplement such as Boost or Ensure regularly and increase healthy snacks  #4 history of macrocytic anemia.  Previous B12 and thyroid normal.  No alcohol use.  Consider repeat CBC along with B12 and thyroid next visit  #5 chronic kidney disease.  Recently taken off losartan and renal function slightly improved.  Recheck basic metabolic panel at follow-up.  Continue avoid all non-steroidals  #6 history of systolic heart failure.  Patient intolerant of losartan and taken off furosemide today secondary to hypotension.  Watch for any peripheral edema or increased dyspnea.  Watch sodium intake.  Keep follow-up with cardiology.  Evelena Peat, MD

## 2023-06-25 ENCOUNTER — Other Ambulatory Visit: Payer: Self-pay | Admitting: *Deleted

## 2023-06-25 DIAGNOSIS — I7143 Infrarenal abdominal aortic aneurysm, without rupture: Secondary | ICD-10-CM

## 2023-07-04 ENCOUNTER — Emergency Department (HOSPITAL_COMMUNITY): Payer: Medicare HMO

## 2023-07-04 ENCOUNTER — Encounter (HOSPITAL_COMMUNITY): Payer: Self-pay

## 2023-07-04 ENCOUNTER — Other Ambulatory Visit: Payer: Self-pay

## 2023-07-04 ENCOUNTER — Telehealth: Payer: Self-pay | Admitting: Family Medicine

## 2023-07-04 ENCOUNTER — Emergency Department (HOSPITAL_COMMUNITY)
Admission: EM | Admit: 2023-07-04 | Discharge: 2023-07-04 | Disposition: A | Discharge status: Home / Self Care | Payer: Medicare HMO | Attending: Emergency Medicine | Admitting: Emergency Medicine

## 2023-07-04 DIAGNOSIS — M47812 Spondylosis without myelopathy or radiculopathy, cervical region: Secondary | ICD-10-CM | POA: Diagnosis not present

## 2023-07-04 DIAGNOSIS — S0003XA Contusion of scalp, initial encounter: Secondary | ICD-10-CM | POA: Diagnosis not present

## 2023-07-04 DIAGNOSIS — S065X0A Traumatic subdural hemorrhage without loss of consciousness, initial encounter: Secondary | ICD-10-CM | POA: Diagnosis not present

## 2023-07-04 DIAGNOSIS — Z8546 Personal history of malignant neoplasm of prostate: Secondary | ICD-10-CM | POA: Diagnosis not present

## 2023-07-04 DIAGNOSIS — I10 Essential (primary) hypertension: Secondary | ICD-10-CM | POA: Insufficient documentation

## 2023-07-04 DIAGNOSIS — I2510 Atherosclerotic heart disease of native coronary artery without angina pectoris: Secondary | ICD-10-CM | POA: Insufficient documentation

## 2023-07-04 DIAGNOSIS — S0990XA Unspecified injury of head, initial encounter: Secondary | ICD-10-CM | POA: Insufficient documentation

## 2023-07-04 DIAGNOSIS — Z7982 Long term (current) use of aspirin: Secondary | ICD-10-CM | POA: Diagnosis not present

## 2023-07-04 DIAGNOSIS — Z79899 Other long term (current) drug therapy: Secondary | ICD-10-CM | POA: Insufficient documentation

## 2023-07-04 DIAGNOSIS — Y9389 Activity, other specified: Secondary | ICD-10-CM | POA: Insufficient documentation

## 2023-07-04 DIAGNOSIS — W208XXA Other cause of strike by thrown, projected or falling object, initial encounter: Secondary | ICD-10-CM | POA: Diagnosis not present

## 2023-07-04 DIAGNOSIS — Z8551 Personal history of malignant neoplasm of bladder: Secondary | ICD-10-CM | POA: Insufficient documentation

## 2023-07-04 DIAGNOSIS — M4802 Spinal stenosis, cervical region: Secondary | ICD-10-CM | POA: Diagnosis not present

## 2023-07-04 DIAGNOSIS — M50321 Other cervical disc degeneration at C4-C5 level: Secondary | ICD-10-CM | POA: Diagnosis not present

## 2023-07-04 DIAGNOSIS — I6200 Nontraumatic subdural hemorrhage, unspecified: Secondary | ICD-10-CM

## 2023-07-04 LAB — CBC WITH DIFFERENTIAL/PLATELET
Abs Immature Granulocytes: 0.01 10*3/uL (ref 0.00–0.07)
Basophils Absolute: 0 10*3/uL (ref 0.0–0.1)
Basophils Relative: 0 %
Eosinophils Absolute: 0.4 10*3/uL (ref 0.0–0.5)
Eosinophils Relative: 7 %
HCT: 34.8 % — ABNORMAL LOW (ref 39.0–52.0)
Hemoglobin: 10.9 g/dL — ABNORMAL LOW (ref 13.0–17.0)
Immature Granulocytes: 0 %
Lymphocytes Relative: 22 %
Lymphs Abs: 1.2 10*3/uL (ref 0.7–4.0)
MCH: 35.6 pg — ABNORMAL HIGH (ref 26.0–34.0)
MCHC: 31.3 g/dL (ref 30.0–36.0)
MCV: 113.7 fL — ABNORMAL HIGH (ref 80.0–100.0)
Monocytes Absolute: 0.6 10*3/uL (ref 0.1–1.0)
Monocytes Relative: 11 %
Neutro Abs: 3.3 10*3/uL (ref 1.7–7.7)
Neutrophils Relative %: 60 %
Platelets: 106 10*3/uL — ABNORMAL LOW (ref 150–400)
RBC: 3.06 MIL/uL — ABNORMAL LOW (ref 4.22–5.81)
RDW: 13 % (ref 11.5–15.5)
WBC: 5.4 10*3/uL (ref 4.0–10.5)
nRBC: 0 % (ref 0.0–0.2)

## 2023-07-04 LAB — BASIC METABOLIC PANEL
Anion gap: 8 (ref 5–15)
BUN: 30 mg/dL — ABNORMAL HIGH (ref 8–23)
CO2: 26 mmol/L (ref 22–32)
Calcium: 9.4 mg/dL (ref 8.9–10.3)
Chloride: 106 mmol/L (ref 98–111)
Creatinine, Ser: 1.32 mg/dL — ABNORMAL HIGH (ref 0.61–1.24)
GFR, Estimated: 53 mL/min — ABNORMAL LOW (ref >60)
Glucose, Bld: 102 mg/dL — ABNORMAL HIGH (ref 70–99)
Potassium: 4.5 mmol/L (ref 3.5–5.1)
Sodium: 140 mmol/L (ref 135–145)

## 2023-07-04 MED ORDER — METOCLOPRAMIDE HCL 10 MG PO TABS: 10.0000 mg | ORAL_TABLET | Freq: Four times a day (QID) | ORAL | 0 refills | Status: DC | PRN

## 2023-07-04 NOTE — Telephone Encounter (Signed)
[name] with triage nurse called and stated pt has a head injury and was recommended to be seen within 4 hours. I let him know unfortunately we did not have any openings. RN [name] stated he would recommend pt be seen at an urgent care. He also stated he was concerned pt would refuse this option. I let him know urgent msg would be sent back to care team.

## 2023-07-04 NOTE — ED Triage Notes (Addendum)
Patient said he was riding his lawn mower and ran into a tree branch that cut the back of his head. Tetanus updated. Not on a blood thinner. Painful to touch. No change in vision.

## 2023-07-04 NOTE — Telephone Encounter (Signed)
I spoke with the patient and he reported he was mowing the lawn when head injury occurred. Patient reported he is unsure how injury happened. I did inform the patient to seek treatment and nearest ER as we have no appointments. Patient stated that he would drive himself and declined for me to call 911 for him. Patient reports no dizziness or vision changes at this time.

## 2023-07-04 NOTE — ED Provider Notes (Signed)
  Physical Exam  BP (!) 154/57   Pulse (!) 48   Temp 98 F (36.7 C) (Oral)   Resp 14   Ht 5' 6.5" (1.689 m)   Wt 55.3 kg   SpO2 98%   BMI 19.40 kg/m   Physical Exam  Procedures  Procedures  ED Course / MDM   Clinical Course as of 07/04/23 1951  Fri Jul 04, 2023  1400 Small hemorrhage noted on CT [JK]  1504 Discussed with Dr Jordan Likes, would repeat CT 6 hours from initial study.  If stable, ok for discharge [JK]    Clinical Course User Index [JK] Linwood Dibbles, MD   Medical Decision Making Care assumed at 3 PM.  Patient had head injury yesterday and had small subdural hemorrhage on CT scan.  Previous provider contacted Dr. Dutch Quint from neurosurgery.  The recommendation was to get repeat CT in 6 hours.  7:51 PM Repeat CT scan showed stable 4 mm extra-axial hemorrhage that is unchanged.  Patient has minimal headache.  Stable for discharge.  Problems Addressed: Closed head injury, initial encounter: acute illness or injury Subdural bleeding [facility]): acute illness or injury  Amount and/or Complexity of Data Reviewed Labs: ordered. Decision-making details documented in ED Course. Radiology: ordered and independent interpretation performed. Decision-making details documented in ED Course.          Charlynne Pander, MD 07/04/23 [phone]

## 2023-07-04 NOTE — Telephone Encounter (Signed)
Noted patient is currently at ED.

## 2023-07-04 NOTE — ED Provider Notes (Signed)
EMERGENCY DEPARTMENT AT [facility] Provider Note   CSN: [id number] Arrival date & time: 07/04/23  1225     History  Chief Complaint  Patient presents with   Head Injury    [name] is a 87 y.o. male.   Head Injury  Patient has a history of prostate cancer hypertension coronary artery disease, acid reflux, abdominal aortic aneurysm, bladder cancer.  Patient states he was riding a lawnmower yesterday and hit the back of his head on a branch.  Patient send he was bleeding last night.  He developed a headache today so he came to the ED to be evaluated.  Patient states he is not sure if he takes a blood thinner.  Records review indicate he takes an aspirin daily but no anticoagulant.  Patient states his headache does radiate towards his left ear and left jaw.  He did not have any trauma to his face or jaw.  He denies any neck pain numbness or weakness.  No vomiting or nausea    Home Medications Prior to Admission medications   Medication Sig Start Date End Date Taking? Authorizing Provider  acetaminophen (TYLENOL) 500 MG tablet Take 500 mg by mouth every 6 (six) hours as needed.    [provider]  aspirin 81 MG EC tablet Take 1 tablet (81 mg total) by mouth daily. 05/11/14   Nahser, Deloris Ping, MD  atorvastatin (LIPITOR) 40 MG tablet TAKE 1 TABLET BY MOUTH EVERY DAY 05/12/23   Burchette, Elberta Fortis, MD  carvedilol (COREG) 12.5 MG tablet TAKE 1 TABLET BY MOUTH TWICE A DAY 09/05/22   Nahser, Deloris Ping, MD  Cyanocobalamin (VITAMIN B12) 1000 MCG TBCR Take 100 mcg by mouth daily.    [provider]  famotidine (PEPCID) 20 MG tablet Take 20 mg by mouth as needed for heartburn or indigestion.    [provider]  furosemide (LASIX) 20 MG tablet TAKE 1 TABLET (20 MG TOTAL) BY MOUTH 3 (THREE) TIMES A WEEK. ON Markham Jordan, FRIDAY 05/12/23   Nahser, Deloris Ping, MD  latanoprost (XALATAN) 0.005 % ophthalmic solution Place 1 drop into both eyes at  bedtime.     [provider]  Multiple Vitamin (MULTIVITAMIN WITH MINERALS) TABS tablet Take 1 tablet by mouth daily.    [provider]  nitroGLYCERIN (NITROSTAT) 0.4 MG SL tablet PLACE 1 TABLET UNDER THE TONUE EVERY 5 MINUTES AS NEEDED FOR CHEST PAIN. 08/01/21   Nahser, Deloris Ping, MD  polyethylene glycol (MIRALAX / Ethelene Hal) packet Take 17 g by mouth daily as needed (for constipation).    [provider]  sertraline (ZOLOFT) 50 MG tablet TAKE 1 TABLET BY MOUTH EVERY DAY 08/26/22   Burchette, Elberta Fortis, MD      Allergies    Bee venom, Amoxicillin, and Hydrocodone    Review of Systems   Review of Systems  Physical Exam Updated Vital Signs BP (!) 138/54   Pulse (!) 57   Temp 98.4 F (36.9 C) (Oral)   Resp 18   Ht 1.689 m (5' 6.5")   Wt 55.3 kg   SpO2 100%   BMI 19.40 kg/m  Physical Exam Vitals and nursing note reviewed.  Constitutional:      General: He is not in acute distress.    Appearance: He is well-developed.  HENT:     Head: Normocephalic.     Comments: Contusion/abrasion noted posterior occiput, no laceration, no tenderness to palpation of the mandible, no malocclusion, no  hemotympanum    Right Ear: External ear normal.     Left Ear: External ear normal.  Eyes:     General: No scleral icterus.       Right eye: No discharge.        Left eye: No discharge.     Conjunctiva/sclera: Conjunctivae normal.  Neck:     Trachea: No tracheal deviation.  Cardiovascular:     Rate and Rhythm: Normal rate.  Pulmonary:     Effort: Pulmonary effort is normal. No respiratory distress.     Breath sounds: No stridor.  Abdominal:     General: There is no distension.  Musculoskeletal:        General: No swelling or deformity.     Cervical back: Neck supple.  Skin:    General: Skin is warm and dry.     Findings: No rash.  Neurological:     Mental Status: He is alert. Mental status is at baseline.     Cranial Nerves: No dysarthria or facial asymmetry.      Motor: No seizure activity.     ED Results / Procedures / Treatments   Labs (all labs ordered are listed, but only abnormal results are displayed) Labs Reviewed - No data to display  EKG None  Radiology CT Head Wo Contrast  Result Date: 07/04/2023 CLINICAL DATA:  Provided history: Head trauma, minor. Neck trauma. Posterior head laceration. EXAM: CT HEAD WITHOUT CONTRAST CT CERVICAL SPINE WITHOUT CONTRAST TECHNIQUE: Multidetector CT imaging of the head and cervical spine was performed following the standard protocol without intravenous contrast. Multiplanar CT image reconstructions of the cervical spine were also generated. RADIATION DOSE REDUCTION: This exam was performed according to the departmental dose-optimization program which includes automated exposure control, adjustment of the mA and/or kV according to patient size and/or use of iterative reconstruction technique. COMPARISON:  Brain MRI 01/12/2017 FINDINGS: CT HEAD FINDING Brain: Generalized cerebral atrophy. Subcentimeter hyperdense focus overlying the left parietal lobe suspicious for small-volume acute extra-axial hemorrhage (series 6, image 45) (series 5, image 56) (series 2, image 25). Patchy and ill-defined hypoattenuation within the cerebral white matter, nonspecific but compatible with mild chronic small vessel ischemic disease. Chronic lacunar infarct within the left thalamus, new from the prior brain MRI of 01/12/2017. No demarcated cortical infarct. No extra-axial fluid collection. No evidence of an intracranial mass. No midline shift. Vascular: No hyperdense vessel.  Atherosclerotic calcifications. Skull: No calvarial fracture or aggressive osseous lesion. Sinuses/Orbits: No mass or acute finding within the imaged orbits. Trace mucosal thickening scattered within the bilateral ethmoid air cells. Other: Posterior scalp hematoma. The reported posterior scalp laceration is not appreciable by CT. CT CERVICAL SPINE FINDINGS Alignment:  Nonspecific straightening of the expected cervical lordosis. Slight grade 1 anterolisthesis at C3-C4. Skull base and vertebrae: The basion-dental and atlanto-dental intervals are maintained.No evidence of acute fracture to the cervical spine. Soft tissues and spinal canal: No prevertebral fluid or swelling. No visible canal hematoma. Disc levels: Cervical spondylosis with multilevel disc space narrowing, posterior disc osteophyte complexes, uncovertebral hypertrophy and facet arthrosis. Disc space narrowing is greatest at C4-C5, C5-C6, C6-C7 and C7-T1 (advanced at these levels). No appreciable high-grade spinal canal stenosis. Multilevel bony neural foraminal narrowing. Multilevel ventral osteophytes. Upper chest: No consolidation within the imaged lung apices. No visible pneumothorax. Left apical pleural calcifications. CT head impression #1 called by telephone at the time of interpretation on 07/04/2023 at 1:59 pm to provider [facility] , who verbally acknowledged these results. IMPRESSION: CT  head: 1. Subcentimeter hyperdense focus overlying the left parietal lobe suspicious for small-volume acute extra-axial hemorrhage. A short-interval follow-up non-contrast head CT is recommended. 2. Posterior scalp hematoma. 3. Chronic lacunar infarct within the left thalamus, new from the prior brain MRI of 01/12/2017. 4. Mild chronic small vessel ischemic changes within the cerebral white matter. 5. Generalized cerebral atrophy. CT cervical spine: 1. No evidence of an acute cervical spine fracture. 2. Nonspecific straightening of the expected cervical lordosis. 3. Mild C3-C4 grade 1 anterolisthesis. 4. Cervical spondylosis as described. Electronically Signed   By: Jackey Loge D.O.   On: 07/04/2023 14:02   CT Cervical Spine Wo Contrast  Result Date: 07/04/2023 CLINICAL DATA:  Provided history: Head trauma, minor. Neck trauma. Posterior head laceration. EXAM: CT HEAD WITHOUT CONTRAST CT CERVICAL SPINE WITHOUT CONTRAST  TECHNIQUE: Multidetector CT imaging of the head and cervical spine was performed following the standard protocol without intravenous contrast. Multiplanar CT image reconstructions of the cervical spine were also generated. RADIATION DOSE REDUCTION: This exam was performed according to the departmental dose-optimization program which includes automated exposure control, adjustment of the mA and/or kV according to patient size and/or use of iterative reconstruction technique. COMPARISON:  Brain MRI 01/12/2017 FINDINGS: CT HEAD FINDING Brain: Generalized cerebral atrophy. Subcentimeter hyperdense focus overlying the left parietal lobe suspicious for small-volume acute extra-axial hemorrhage (series 6, image 45) (series 5, image 56) (series 2, image 25). Patchy and ill-defined hypoattenuation within the cerebral white matter, nonspecific but compatible with mild chronic small vessel ischemic disease. Chronic lacunar infarct within the left thalamus, new from the prior brain MRI of 01/12/2017. No demarcated cortical infarct. No extra-axial fluid collection. No evidence of an intracranial mass. No midline shift. Vascular: No hyperdense vessel.  Atherosclerotic calcifications. Skull: No calvarial fracture or aggressive osseous lesion. Sinuses/Orbits: No mass or acute finding within the imaged orbits. Trace mucosal thickening scattered within the bilateral ethmoid air cells. Other: Posterior scalp hematoma. The reported posterior scalp laceration is not appreciable by CT. CT CERVICAL SPINE FINDINGS Alignment: Nonspecific straightening of the expected cervical lordosis. Slight grade 1 anterolisthesis at C3-C4. Skull base and vertebrae: The basion-dental and atlanto-dental intervals are maintained.No evidence of acute fracture to the cervical spine. Soft tissues and spinal canal: No prevertebral fluid or swelling. No visible canal hematoma. Disc levels: Cervical spondylosis with multilevel disc space narrowing, posterior disc  osteophyte complexes, uncovertebral hypertrophy and facet arthrosis. Disc space narrowing is greatest at C4-C5, C5-C6, C6-C7 and C7-T1 (advanced at these levels). No appreciable high-grade spinal canal stenosis. Multilevel bony neural foraminal narrowing. Multilevel ventral osteophytes. Upper chest: No consolidation within the imaged lung apices. No visible pneumothorax. Left apical pleural calcifications. CT head impression #1 called by telephone at the time of interpretation on 07/04/2023 at 1:59 pm to provider [facility] , who verbally acknowledged these results. IMPRESSION: CT head: 1. Subcentimeter hyperdense focus overlying the left parietal lobe suspicious for small-volume acute extra-axial hemorrhage. A short-interval follow-up non-contrast head CT is recommended. 2. Posterior scalp hematoma. 3. Chronic lacunar infarct within the left thalamus, new from the prior brain MRI of 01/12/2017. 4. Mild chronic small vessel ischemic changes within the cerebral white matter. 5. Generalized cerebral atrophy. CT cervical spine: 1. No evidence of an acute cervical spine fracture. 2. Nonspecific straightening of the expected cervical lordosis. 3. Mild C3-C4 grade 1 anterolisthesis. 4. Cervical spondylosis as described. Electronically Signed   By: Jackey Loge D.O.   On: 07/04/2023 14:02    Procedures Procedures  Medications Ordered in ED Medications - No data to display  ED Course/ Medical Decision Making/ A&P Clinical Course as of 07/04/23 1524  Fri Jul 04, 2023  1400 Small hemorrhage noted on CT [JK]  1504 Discussed with Dr Jordan Likes, would repeat CT 6 hours from initial study.  If stable, ok for discharge [JK]    Clinical Course User Index [JK] Linwood Dibbles, MD                                 Medical Decision Making Problems Addressed: Closed head injury, initial encounter: acute illness or injury that poses a threat to life or bodily functions  Amount and/or Complexity of Data Reviewed Radiology:  ordered and independent interpretation performed.   Patient presented to the ED for evaluation of a head injury.  Patient was struck in the head with a branch yesterday.  He developed headache and was instructed to come to the ED for evaluation.  Patient initial head CT shows the possibility of small amount of hemorrhage.  Patient remains alert and oriented.  He denies any severe headache and does not want any pain medications right now.  No vomiting.  No confusion.  Images reviewed with Dr. Dutch Quint and neurosurgery.  Recommends repeat imaging in 6 hours from the initial study.  If stable patient would be appropriate for discharge.  Care turned over to Dr Silverio Lay pending repeat CT scan        Final Clinical Impression(s) / ED Diagnoses Final diagnoses:  Closed head injury, initial encounter    Rx / DC Orders ED Discharge Orders     None         Linwood Dibbles, MD 07/04/23 1524

## 2023-07-04 NOTE — Discharge Instructions (Addendum)
As we discussed, you have small amount of subdural hemorrhage of the left side of your brain from the trauma  Please avoid any ibuprofen or aspirin.  You are expected to have headache and dizziness.  You may take Tylenol or Reglan for headaches  See your doctor for follow-up  Return to ER if you have worse headache or vomiting

## 2023-07-08 ENCOUNTER — Telehealth (INDEPENDENT_AMBULATORY_CARE_PROVIDER_SITE_OTHER): Payer: Medicare HMO | Admitting: Family Medicine

## 2023-07-08 VITALS — Wt 130.0 lb

## 2023-07-08 DIAGNOSIS — Z0000 Encounter for general adult medical examination without abnormal findings: Secondary | ICD-10-CM

## 2023-07-08 NOTE — Progress Notes (Signed)
PATIENT CHECK-IN and HEALTH RISK ASSESSMENT QUESTIONNAIRE:  -completed by phone/video for upcoming Medicare Preventive Visit  Pre-Visit Check-in: 1)Vitals (height, wt, BP, etc) - record in vitals section for visit on day of visit Request home vitals (wt, BP, etc.) and enter into vitals, THEN update Vital Signs SmartPhrase below at the top of the HPI. See below.  2)Review and Update Medications, Allergies PMH, Surgeries, Social history in Epic 3)Hospitalizations in the last year with date/reason?  No    4)Review and Update Care Team (patient's specialists) in Epic 5) Complete PHQ9 in Epic  6) Complete Fall Screening in Epic 7)Review all Health Maintenance Due and order under PCP if not done.  Medicare Wellness Patient Questionnaire:  Answer theses question about your habits: Do you drink alcohol?  Yes  If yes, how many drinks do you have a day?once every 2 months  Have you ever smoked?yes  Quit date if applicable? 10 years ago   How many packs a day do/did you smoke? Quite a bit  Do you use smokeless tobacco? No  Do you use an illicit drugs? No  Do you exercises?  Yes IF so, what type and how many days/minutes per week? Works around [employer] Are you sexually active?  No Feels diet is good, gets protein, fruits and veggies Typical breakfast: eggs bacon, applesauce and oj  Typical lunch vary  Typical dinner varies, depends on lunch  Typical snacks: no   Beverages: mt dew, water, oj   Answer theses question about you: Can you perform most household chores?yes  Do you find it hard to follow a conversation in a noisy room? No  Do you often ask people to speak up or repeat themselves? No, but getting that way  Do you feel that you have a problem with memory? Getting that way  Do you balance your checkbook and or bank acounts? yes Do you feel safe at home? Yes  Last dentist visit? This year  Do you need assistance with any of the following: Please note if so  no   Driving?  Feeding  yourself?  Getting from bed to chair?  Getting to the toilet?  Bathing or showering?  Dressing yourself?  Managing money?  Climbing a flight of stairs  Preparing meals?    Do you have Advanced Directives in place (Living Will, Healthcare Power or Attorney)? Yes    Last eye Exam and location?this year    Do you currently use prescribed or non-prescribed narcotic or opioid pain medications? No   Do you have a history or close family history of breast, ovarian, tubal or peritoneal cancer or a family member with BRCA (breast cancer susceptibility 1 and 2) gene mutations? No   Not able to perform due to televisit. Request home vitals (wt, BP, etc.) and enter into vitals, THEN update Vital Signs SmartPhrase below at the top of the HPI. See below.   Nurse/Assistant Credentials/time stamp: Tora Perches CMA 11:24 am   ----------------------------------------------------------------------------------------------------------------------------------------------------------------------------------------------------------------------  Vital Signs: wt 130 lbs today   MEDICARE ANNUAL PREVENTIVE CARE VISIT WITH PROVIDER (Welcome to Medicare, initial annual wellness or annual wellness exam)  Virtual Visit via Phone Note  I connected with [name] on 07/08/23 by phone and verified that I am speaking with the correct person using two identifiers.  Location patient: home Location provider:work or home office Persons participating in the virtual visit: patient, provider  Concerns and/or follow up today: unchanged from baseline   See HM section in Epic for  other details of completed HM.    ROS: negative for report of fevers, unintentional weight loss, vision changes, vision loss, hearing loss or change, chest pain, sob, hemoptysis, melena, hematochezia, hematuria, falls, bleeding or bruising, thoughts of suicide or self harm, memory loss  Patient-completed extensive health risk assessment  - reviewed and discussed with the patient: See Health Risk Assessment completed with patient prior to the visit either above or in recent phone note. This was reviewed in detailed with the patient today and appropriate recommendations, orders and referrals were placed as needed per Summary below and patient instructions.   Review of Medical History: -PMH, PSH, Family History and current specialty and care providers reviewed and updated and listed below   Patient Care Team: Kristian Covey, MD as PCP - General Nahser, Deloris Ping, MD as PCP - Cardiology (Cardiology) Elise Benne, MD as Consulting Physician (Ophthalmology) Leitha Bleak, Milas Kocher, [facility] (Inactive) (Pharmacist)   Past Medical History:  Diagnosis Date   AAA (abdominal aortic aneurysm) [facility])    AAA (abdominal aortic aneurysm) (HCC)    4-4 cm intrarenal aaa per US aorta 03-28-2019 epic   Benign positional vertigo    Bladder cancer [facility])    Bladder neck contracture    Coronary artery disease CARDIOLOGIST--  DR Elease Hashimoto   ED (erectile dysfunction)    GERD (gastroesophageal reflux disease)    H/O hiatal hernia    History of anal fissures    History of colon polyps    History of radiation therapy 08/24/12-10/22/12   prostate 6600cGy/33 sessions--  for biochemical recurrence   Hyperlipidemia    Hypertension    OA (osteoarthritis)    Peripheral vascular disease (HCC)    Recurrent prostate adenocarcinoma (HCC) first dx 09/2005--  radial prostatectomy-- gleason 4+3=7, pT2c, negative margin   04/2012--  BIOCHEMICAL  RECURRENCE to 0.33 /  SALVAGE IMRT SEPT to  NOV 2013   S/P CABG x 2    09/  2014   S/P dilatation of esophageal stricture    1992   Sciatica    both hips at times mostly left hip   Sigmoid diverticulosis    SUI (stress urinary incontinence), male    Urethral stricture    Wears glasses     Past Surgical History:  Procedure Laterality Date   CARDIAC CATHETERIZATION  08-27-2013  DR Swaz[place]and   CRITICAL OSTIAL LM  STENOSIS/  diffuse disease LAD 40-50%  &   pLCx 30-40%/   CARDIOVASCULAR STRESS TEST  08-18-2013  DR NASHER   SMALL/ MEDIUM AREA MILD SCAR INFEROSEPTAL WALL/ NO ISCHEMIA/  LOW RISK SCAN/  EF 62%/  LV WALL MOTION WITH SLIGHT DECREASED OF THE SEPTUM   CORONARY ANGIOPLASTY  1991   PTCA  of LEFT CFX   CORONARY ARTERY BYPASS GRAFT N/A 08/27/2013   Procedure: CORONARY ARTERY BYPASS GRAFTING (CABG) times two using left internal mammary and right saphenous vein using endoscope.;  Surgeon: Loreli Slot, MD;  Location: [facility] OR;  Service: Open Heart Surgery;  Laterality: N/A;   CYSTOSCOPY WITH URETHRAL DILATATION N/A 01/26/2014   Procedure: CYSTOSCOPY WITH URETHRAL DILATATION, BLADDER BIOPSY, FOLEY CATHETER;  Surgeon: Milford Cage, MD;  Location: [facility] ORS;  Service: Urology;  Laterality: N/A;   INGUINAL HERNIA REPAIR Bilateral 1994   KNEE ARTHROSCOPY Right yrs ago   LEFT HEART CATHETERIZATION WITH CORONARY ANGIOGRAM N/A 08/27/2013   Procedure: LEFT HEART CATHETERIZATION WITH CORONARY ANGIOGRAM;  Surgeon: Peter M Swaziland, MD;  Location: [facility] CATH LAB;  Service: Cardiovascular;  Laterality: N/A;   LUMBAR DISC SURGERY  1980'S   RADICAL RETROPUBIC PROSTATECTOMY W/ BILATERAL PELVIC NODE DISSECTION  11-18-2005   TRANSURETHRAL INCISION OF PROSTATE N/A 11/01/2020   Procedure: TRANSURETHRAL INCISION OF THE BLADDER NECK;  Surgeon: Crist Fat, MD;  Location: [facility];  Service: Urology;  Laterality: N/A;   URETHROTOMY N/A 05/30/2014   Procedure: CYSTOSCOPY  BALLOON DILATION AND DIRECT VISION INTERNAL URETHROTOMY;  Surgeon: Magdalene Molly, MD;  Location: [facility];  Service: Urology;  Laterality: N/A;    Social History   Socioeconomic History   Marital status: Single    Spouse name: Not on file   Number of children: 2   Years of education: 13   Highest education level: Not on file  Occupational History   Occupation: Retired    Associate Professor: RETIRED  Tobacco  Use   Smoking status: Former    Current packs/day: 0.00    Average packs/day: 2.0 packs/day for 10.0 years (20.0 ttl pk-yrs)    Types: Cigarettes    Start date: 12/03/1984    Quit date: 12/03/1994    Years since quitting: 28.6   Smokeless tobacco: Never  Vaping Use   Vaping status: Never Used  Substance and Sexual Activity   Alcohol use: Yes    Comment: occasionally   Drug use: No   Sexual activity: Not on file  Other Topics Concern   Not on file  Social History Narrative   Lives alone   Caffeine use: Coffee- 3 cups daily   Soda sometimes.    Social Determinants of Health   Financial Resource Strain: Low Risk  (01/22/2023)   Overall Financial Resource Strain (CARDIA)    Difficulty of Paying Living Expenses: Not very hard  Food Insecurity: No Food Insecurity (01/22/2023)   Hunger Vital Sign    Worried About Running Out of Food in the Last Year: Never true    Ran Out of Food in the Last Year: Never true  Transportation Needs: No Transportation Needs (01/22/2023)   PRAPARE - Administrator, Civil Service (Medical): No    Lack of Transportation (Non-Medical): No  Physical Activity: Inactive (06/18/2023)   Exercise Vital Sign    Days of Exercise per Week: 0 days    Minutes of Exercise per Session: 0 min  Stress: No Stress Concern Present (06/18/2023)   [employer] of Occupational Health - Occupational Stress Questionnaire    Feeling of Stress : Not at all  Social Connections: Unknown (06/18/2023)   Social Connection and Isolation Panel [NHANES]    Frequency of Communication with Friends and Family: Three times a week    Frequency of Social Gatherings with Friends and Family: Three times a week    Attends Religious Services: Never    Active Member of Clubs or Organizations: No    Attends Banker Meetings: Never    Marital Status: Patient unable to answer  Intimate Partner Violence: Patient Unable To Answer (06/18/2023)   Humiliation, Afraid, Rape, and  Kick questionnaire    Fear of Current or Ex-Partner: Patient unable to answer    Emotionally Abused: Patient unable to answer    Physically Abused: Patient unable to answer    Sexually Abused: Patient unable to answer    Family History  Problem Relation Age of Onset   Cancer Father        prostate died 78 something   Cancer Brother        prostate cancer  Arthritis Other    Hyperlipidemia Other    Hypertension Other     Current Outpatient Medications on File Prior to Visit  Medication Sig Dispense Refill   acetaminophen (TYLENOL) 500 MG tablet Take 500 mg by mouth every 6 (six) hours as needed.     aspirin 81 MG EC tablet Take 1 tablet (81 mg total) by mouth daily.     atorvastatin (LIPITOR) 40 MG tablet TAKE 1 TABLET BY MOUTH EVERY DAY 90 tablet 0   carvedilol (COREG) 12.5 MG tablet TAKE 1 TABLET BY MOUTH TWICE A DAY 180 tablet 3   Cyanocobalamin (VITAMIN B12) 1000 MCG TBCR Take 100 mcg by mouth daily.     famotidine (PEPCID) 20 MG tablet Take 20 mg by mouth as needed for heartburn or indigestion.     latanoprost (XALATAN) 0.005 % ophthalmic solution Place 1 drop into both eyes at bedtime.      metoCLOPramide (REGLAN) 10 MG tablet Take 1 tablet (10 mg total) by mouth every 6 (six) hours as needed for nausea (nausea/headache). 6 tablet 0   Multiple Vitamin (MULTIVITAMIN WITH MINERALS) TABS tablet Take 1 tablet by mouth daily.     nitroGLYCERIN (NITROSTAT) 0.4 MG SL tablet PLACE 1 TABLET UNDER THE TONUE EVERY 5 MINUTES AS NEEDED FOR CHEST PAIN. 75 tablet 3   polyethylene glycol (MIRALAX / GLYCOLAX) packet Take 17 g by mouth daily as needed (for constipation).     sertraline (ZOLOFT) 50 MG tablet TAKE 1 TABLET BY MOUTH EVERY DAY 90 tablet 3   furosemide (LASIX) 20 MG tablet TAKE 1 TABLET (20 MG TOTAL) BY MOUTH 3 (THREE) TIMES A WEEK. ON MONDAY, WEDNESDAY, FRIDAY (Patient not taking: Reported on 07/08/2023) 45 tablet 3   No current facility-administered medications on file prior to  visit.    Allergies  Allergen Reactions   Bee Venom Other (See Comments)    Reaction swelling   Amoxicillin Rash   Hydrocodone Other (See Comments)    REACTION: disorientation       Physical Exam Vitals requested from patient and listed below if patient had equipment and was able to obtain at home for this virtual visit: There were no vitals filed for this visit. Estimated body mass index is 20.67 kg/m as calculated from the following:   Height as of 07/04/23: 5' 6.5" (1.689 m).   Weight as of this encounter: 130 lb (59 kg).  EKG (optional): deferred due to virtual visit  GENERAL: alert, oriented, no acute distress detected; full vision exam deferred due to pandemic and/or virtual encounter  PSYCH/NEURO: pleasant and cooperative, no obvious depression or anxiety, speech and thought processing grossly intact, Cognitive function grossly intact  Flowsheet Row Office Visit from [facility]/18/2023 [facility]  PHQ-9 Total Score 0           07/08/2023   11:30 AM 06/18/2023   10:36 AM 12/03/2022    4:22 PM 05/14/2022   11:48 AM 01/22/2022    2:57 PM  Depression screen PHQ 2/9  Decreased Interest 0 0 0 1 0  Down, Depressed, Hopeless 0 0 0 0 0  PHQ - 2 Score 0 0 0 1 0  Altered sleeping  0   0  Tired, decreased energy  0   0  Change in appetite  0   0  Feeling bad or failure about yourself   0   0  Trouble concentrating  0   0  Moving slowly or fidgety/restless  0   0  Suicidal thoughts  0   0  PHQ-9 Score  0   0  Difficult doing work/chores  Not difficult at all          05/14/2022   11:47 AM 12/03/2022    4:22 PM 04/30/2023    1:25 PM 06/18/2023   10:35 AM 07/08/2023   11:30 AM  Fall Risk  Falls in the past year? 0 0 0 0 1  Was there an injury with Fall? 0 0 0 0 0  Fall Risk Category Calculator 0 0 0 0 1  Fall Risk Category (Retired) Low Low     (RETIRED) Patient Fall Risk Level Low fall risk Low fall risk     Patient at Risk for Falls Due to  Medication side effect No Fall Risks No Fall Risks No Fall Risks   Fall risk Follow up Falls evaluation completed;Falls prevention discussed Falls evaluation completed Falls evaluation completed Falls evaluation completed    Tripped over a root. Does not walk with walker or cane.   SUMMARY AND PLAN:  Encounter for Medicare annual wellness exam   Discussed applicable health maintenance/preventive health measures and advised and referred or ordered per patient preferences: -discussed updated covid and flu shots in the fall and where could get Health Maintenance  Topic Date Due   COVID-19 Vaccine (6 - 2023-24 season) 08/02/2022   INFLUENZA VACCINE  07/03/2023   Medicare Annual Wellness (AWV)  07/07/2024   DTaP/Tdap/Td (3 - Tdap) 05/20/2029   Pneumonia Vaccine 67+ Years old  Completed   Zoster Vaccines- Shingrix  Completed   HPV VACCINES  Aged [employer] and counseling on the following was provided based on the above review of health and a plan/checklist for the patient, along with additional information discussed, was provided for the patient in the patient instructions :   -Provided counseling and plan for increased risk of falling if applicable per above screening. Discussed safe balance exercises that can be done at home to improve balance and discussed exercise guidelines for adults with include balance exercises at least 3 days per week.  -Advised and counseled on a healthy lifestyle  -Reviewed patient's current diet. Advised and counseled on a whole foods based healthy diet. A summary of a healthy diet was provided in the Patient Instructions.  -reviewed patient's current physical activity level and discussed exercise guidelines for adults. Discussed community resources and ideas for safe exercise at home to assist in meeting exercise guideline recommendations in a safe and healthy way.  -Advise yearly dental visits at minimum and regular eye exams   Follow up: see patient  instructions   Patient Instructions  I really enjoyed getting to talk with you today! I am available on Tuesdays and Thursdays for virtual visits if you have any questions or concerns, or if I can be of any further assistance.   CHECKLIST FROM ANNUAL WELLNESS VISIT:  -Follow up (please call to schedule if not scheduled after visit):   -yearly for annual wellness visit with primary care office  Here is a list of your preventive care/health maintenance measures and the plan for each if any are due:  PLAN For any measures below that may be due:  -please consider the updated covid and flu shots in the fall each year - can get at the pharmacy, let us know if you get them so that we can update in your record  Health Maintenance  Topic Date Due   COVID-19  Vaccine (6 - 2023-24 season) 08/02/2022   INFLUENZA VACCINE  07/03/2023   Medicare Annual Wellness (AWV)  07/07/2024   DTaP/Tdap/Td (3 - Tdap) 05/20/2029   Pneumonia Vaccine 78+ Years old  Completed   Zoster Vaccines- Shingrix  Completed   HPV VACCINES  Aged Out    -See a dentist at least yearly  -Get your eyes checked and then per your eye specialist's recommendations  -Other issues addressed today:   -I have included below further information regarding a healthy whole foods based diet, physical activity guidelines for adults, stress management and opportunities for social connections. I hope you find this information useful.   -----------------------------------------------------------------------------------------------------------------------------------------------------------------------------------------------------------------------------------------------------------  NUTRITION: -eat real food: lots of colorful vegetables (half the plate) and fruits -5-7 servings of vegetables and fruits per day (fresh or steamed is best), exp. 2 servings of vegetables with lunch and dinner and 2 servings of fruit per day. Berries and  greens such as kale and collards are great choices.  -consume on a regular basis: whole grains (make sure first ingredient on label contains the word "whole"), fresh fruits, fish, nuts, seeds, healthy oils (such as olive oil, avocado oil, grape seed oil) -may eat small amounts of dairy and lean meat on occasion, but avoid processed meats such as ham, bacon, lunch meat, etc. -drink water -try to avoid fast food and pre-packaged foods, processed meat -most experts advise limiting sodium to < 2300mg  per day, should limit further is any chronic conditions such as high blood pressure, heart disease, diabetes, etc. The American Heart Association advised that < 1500mg  is is ideal -try to avoid foods that contain any ingredients with names you do not recognize  -try to avoid sugar/sweets (except for the natural sugar that occurs in fresh fruit) -try to avoid sweet drinks -try to avoid white rice, white bread, pasta (unless whole grain), white or yellow potatoes  EXERCISE GUIDELINES FOR ADULTS: -if you wish to increase your physical activity, do so gradually and with the approval of your doctor -STOP and seek medical care immediately if you have any chest pain, chest discomfort or trouble breathing when starting or increasing exercise  -move and stretch your body, legs, feet and arms when sitting for long periods -Physical activity guidelines for optimal health in adults: -least 150 minutes per week of aerobic exercise (can talk, but not sing) once approved by your doctor, 20-30 minutes of sustained activity or two 10 minute episodes of sustained activity every day.  -resistance training at least 2 days per week if approved by your doctor -balance exercises 3+ days per week:   Stand somewhere where you have something sturdy to hold onto if you lose balance.    1) lift up on toes, start with 5x per day and work up to 20x   2) stand and lift on leg straight out to the side so that foot is a few inches of  the floor, start with 5x each side and work up to 20x each side   3) stand on one foot, start with 5 seconds each side and work up to 20 seconds on each side  If you need ideas or help with getting more active:  -Silver sneakers https://tools.silversneakers.com  -Walk with a Doc: http://www.duncan-williams.com/  -try to include resistance (weight lifting/strength building) and balance exercises twice per week: or the following link for ideas: http://castillo-powell.com/  BuyDucts.dk  STRESS MANAGEMENT: -can try meditating, or just sitting quietly with deep breathing while intentionally relaxing all parts of your body  for 5 minutes daily -if you need further help with stress, anxiety or depression please follow up with your primary doctor or contact the wonderful folks at [facility]: [phone]  SOCIAL CONNECTIONS: -options in [place] if you wish to engage in more social and exercise related activities:  -Silver sneakers https://tools.silversneakers.com  -Walk with a Doc: http://www.duncan-williams.com/  -Check out the [facility] Active Adults 50+ section on the [place] [employer] (hiking clubs, book clubs, cards and games, chess, exercise classes, aquatic classes and much more) - see the website for details: https://www.[place].gov/departments/parks-recreation/active-adults50  -YouTube has lots of exercise videos for different ages and abilities as well  -[facility] (a variety of indoor and outdoor inperson activities for adults). 7858438265. [address]  -Virtual Online Classes (a variety of topics): see seniorplanet.org or call [phone]  -consider volunteering at a school, hospice center, church, senior center or elsewhere           Terressa Koyanagi, DO

## 2023-07-08 NOTE — Patient Instructions (Signed)
I really enjoyed getting to talk with you today! I am available on Tuesdays and Thursdays for virtual visits if you have any questions or concerns, or if I can be of any further assistance.   CHECKLIST FROM ANNUAL WELLNESS VISIT:  -Follow up (please call to schedule if not scheduled after visit):   -yearly for annual wellness visit with primary care office  Here is a list of your preventive care/health maintenance measures and the plan for each if any are due:  PLAN For any measures below that may be due:  -please consider the updated covid and flu shots in the fall each year - can get at the pharmacy, let us know if you get them so that we can update in your record  Health Maintenance  Topic Date Due   COVID-19 Vaccine (6 - 2023-24 season) 08/02/2022   INFLUENZA VACCINE  07/03/2023   Medicare Annual Wellness (AWV)  07/07/2024   DTaP/Tdap/Td (3 - Tdap) 05/20/2029   Pneumonia Vaccine 87+ Years old  Completed   Zoster Vaccines- Shingrix  Completed   HPV VACCINES  Aged Out    -See a dentist at least yearly  -Get your eyes checked and then per your eye specialist's recommendations  -Other issues addressed today:   -I have included below further information regarding a healthy whole foods based diet, physical activity guidelines for adults, stress management and opportunities for social connections. I hope you find this information useful.   -----------------------------------------------------------------------------------------------------------------------------------------------------------------------------------------------------------------------------------------------------------  NUTRITION: -eat real food: lots of colorful vegetables (half the plate) and fruits -5-7 servings of vegetables and fruits per day (fresh or steamed is best), exp. 2 servings of vegetables with lunch and dinner and 2 servings of fruit per day. Berries and greens such as kale and collards are great  choices.  -consume on a regular basis: whole grains (make sure first ingredient on label contains the word "whole"), fresh fruits, fish, nuts, seeds, healthy oils (such as olive oil, avocado oil, grape seed oil) -may eat small amounts of dairy and lean meat on occasion, but avoid processed meats such as ham, bacon, lunch meat, etc. -drink water -try to avoid fast food and pre-packaged foods, processed meat -most experts advise limiting sodium to < 2300mg  per day, should limit further is any chronic conditions such as high blood pressure, heart disease, diabetes, etc. The American Heart Association advised that < 1500mg  is is ideal -try to avoid foods that contain any ingredients with names you do not recognize  -try to avoid sugar/sweets (except for the natural sugar that occurs in fresh fruit) -try to avoid sweet drinks -try to avoid white rice, white bread, pasta (unless whole grain), white or yellow potatoes  EXERCISE GUIDELINES FOR ADULTS: -if you wish to increase your physical activity, do so gradually and with the approval of your doctor -STOP and seek medical care immediately if you have any chest pain, chest discomfort or trouble breathing when starting or increasing exercise  -move and stretch your body, legs, feet and arms when sitting for long periods -Physical activity guidelines for optimal health in adults: -least 150 minutes per week of aerobic exercise (can talk, but not sing) once approved by your doctor, 20-30 minutes of sustained activity or two 10 minute episodes of sustained activity every day.  -resistance training at least 2 days per week if approved by your doctor -balance exercises 3+ days per week:   Stand somewhere where you have something sturdy to hold onto if you lose balance.  1) lift up on toes, start with 5x per day and work up to 20x   2) stand and lift on leg straight out to the side so that foot is a few inches of the floor, start with 5x each side and work  up to 20x each side   3) stand on one foot, start with 5 seconds each side and work up to 20 seconds on each side  If you need ideas or help with getting more active:  -Silver sneakers https://tools.silversneakers.com  -Walk with a Doc: http://www.duncan-williams.com/  -try to include resistance (weight lifting/strength building) and balance exercises twice per week: or the following link for ideas: http://castillo-powell.com/  BuyDucts.dk  STRESS MANAGEMENT: -can try meditating, or just sitting quietly with deep breathing while intentionally relaxing all parts of your body for 5 minutes daily -if you need further help with stress, anxiety or depression please follow up with your primary doctor or contact the wonderful folks at [facility]: [phone]  SOCIAL CONNECTIONS: -options in [place] if you wish to engage in more social and exercise related activities:  -Silver sneakers https://tools.silversneakers.com  -Walk with a Doc: http://www.duncan-williams.com/  -Check out the [facility] Active Adults 50+ section on the [place] [employer] (hiking clubs, book clubs, cards and games, chess, exercise classes, aquatic classes and much more) - see the website for details: https://www.[place].gov/departments/parks-recreation/active-adults50  -YouTube has lots of exercise videos for different ages and abilities as well  -[facility] (a variety of indoor and outdoor inperson activities for adults). [phone]. [address].  -Virtual Online Classes (a variety of topics): see seniorplanet.org or call [phone]  -consider volunteering at a school, hospice center, church, senior center or elsewhere

## 2023-07-10 ENCOUNTER — Telehealth: Payer: Self-pay

## 2023-07-10 NOTE — Telephone Encounter (Signed)
Transition Care Management Unsuccessful Follow-up Telephone Call  Date of discharge and from where:  [facility]  Attempts:  1st Attempt  Reason for unsuccessful TCM follow-up call:  No answer/busy   Lenard Forth [facility] Guide, [facility] [phone] [address] [place], [place], [place] [address] Phone: [phone] Email: [email or web address]

## 2023-07-10 NOTE — Progress Notes (Signed)
[name] Date of Birth  08-27-1936         [address]    [address]    [place], [place]  [address]          Problem list: 1. Coronary artery disease- status post remote PTCA to the left complex artery in 1991, CABG 2014 2. Hypertension 3. Hyperlipidemia 4. Prostate Cancer.     [name] is  a 87 y.o. -year-old gentleman with a history of PTCA approximately 22 years ago.  He's not had any episodes of chest pain or shortness of breath. He stays fairly active although he doesn't exercise at the gym.  He has some problems with some arthritis-type pain in his right hip.  The Lipitor causes a little bit of muscular skeletal pain.  May 10, 2013:  He stopped hib Benicar 2 months ago. His BP has been running low at home.  He has been on a new medicine for his prostate and was concerned that his BP would run too low.   He has been taking a varied dose of benicar - 1 tab, 1/2 tab, 1/4 tablet whenever he thinks he needs it.   Sept. 5, 2014:  [name] presents today for worsening shortness of breath and chest pain.  Last Saturday, he mowed the lawn and spread 200 lbs of lime.  He developed severe CP and started to take some NTG.  The pain finally eased up. Several days later, he had recurrent CP while cleaning out the gutters.    He thinks he was just overheated.   He has been recording his BP and has been getting some low readings.     He has been having some hip pain and does not know if he will be able to walk on the treadmill.   Sept. 24, 2014: [name] was seen recently with some worsening chest pain. He had a stress Myoview study which was interpreted as low risk he has a fixed inferoapical defect.  He exertion, he is still having angina.  Lasts 5 minutes. In the center of his chest. No radiatioin.  Occurs only with exertion ( stating mower or Rototiller- never walking around the house.)   Class II - III angina.    Yesterday, he had some mild baseline CP which worsened while  walking around the grocery store.   Dec. 16, 2014:  [name] has had CABG since I last saw him in the office.  Still has chest tenderness - breathing is good, no CP  Chest wall is still tender.  May 11, 2014:  [name] is doing ok.   Jan. 4, 2015: Doing well  July 21, 2015:  Doing well from a cardiac standpoint  Still lonely ,  Mentally not where he needs to be since the death of his wife .   Visits with his 2 daughters frequently    Feb. 16, 2017:  Still grieving over the death of his wife , 16 months ago .   Were married 57 years.  Got some [facility] at [facility] but was not as effective as he would like    Aug. 16, 2017: No CP or dyspnea  Has easy bruising on his arms due to the ASA walks some on the treadmill  No CP   Feb. 12, 2018  Has had some visual issues. Had a head MRI yesterday .   No CP or dyspnea   Oct. 4, 2019:  Has been having more leg swelling recently .  Still eats some salts  Fixes his own meals .   Still eats some canned veggies  Is not more short of breath compared to his usual . No CP  Has been having swelling of his left lower leg   Aug. 7, 2020:  Doing well.   .  Little of exercise.   Treadmill 30 min a day  No CP or dyspnea with walking   Feb. 9, 2021:  [name] is seen today . Has a hx of CAD and chronic combined CHF No CP .  Breathing is ok   Avoids salt .     07/31/2020:  No cardiac complains Has leg cramps at night  Has occasional low BP readings .  Occasionally associated with some dizziness.  No CP .    Feb. 28, 2022 [name] is seen today for follow of his CAD, AAA Still eating some sal t   July 31, 2021: [name] is seen today for follow-up of his coronary artery disease and abdominal aortic aneurysm.  Blood pressure is much better. Has some orthostasis at times, when he stands up from a squatting position.  Does not know if the symptoms are worse on days that he takes his furosemide ( takes it QOD )  Has lost weight since  last year  Aug. 8, 2023 [name] is seen for follow up  Wt is 129 lbs ( down 4 lbs from aug, 2022)  Still having orthostatic hypotension symptoms  Is not eating very well.   Has a 4.5 cm AAA ,  followed by Dr. Fredia Beets Readings from Last 3 Encounters:  07/11/23 132 lb 9.6 oz (60.1 kg)  07/08/23 130 lb (59 kg)  07/04/23 122 lb (55.3 kg)     Aug. 8, 2023 [name] is seen for follow up of his CAD and AAA,    Jul 11, 2023 [name] is seen for follow up of his CAD, CABG, AAA Seen with daughter, Arline Asp ,   AAA is followed by [facility]  Wt is 132 lbs   Is having some head trauma from a tree limb  Has some occasional dizziness He thinks its related to standing   HR is 57 BP is 110/72 Will reduce the coreg to 6.25 BID    Current Outpatient Medications on File Prior to Visit  Medication Sig Dispense Refill   acetaminophen (TYLENOL) 500 MG tablet Take 500 mg by mouth every 6 (six) hours as needed.     aspirin 81 MG EC tablet Take 1 tablet (81 mg total) by mouth daily.     atorvastatin (LIPITOR) 40 MG tablet TAKE 1 TABLET BY MOUTH EVERY DAY 90 tablet 0   Cyanocobalamin (VITAMIN B12) 1000 MCG TBCR Take 100 mcg by mouth daily.     latanoprost (XALATAN) 0.005 % ophthalmic solution Place 1 drop into both eyes at bedtime.      Multiple Vitamin (MULTIVITAMIN WITH MINERALS) TABS tablet Take 1 tablet by mouth daily.     nitroGLYCERIN (NITROSTAT) 0.4 MG SL tablet PLACE 1 TABLET UNDER THE TONUE EVERY 5 MINUTES AS NEEDED FOR CHEST PAIN. 75 tablet 3   polyethylene glycol (MIRALAX / GLYCOLAX) packet Take 17 g by mouth daily as needed (for constipation).     sertraline (ZOLOFT) 50 MG tablet TAKE 1 TABLET BY MOUTH EVERY DAY 90 tablet 3   metoCLOPramide (REGLAN) 10 MG tablet Take 1 tablet (10 mg total) by mouth every 6 (six) hours as needed for nausea (nausea/headache). (Patient not taking: Reported on  07/11/2023) 6 tablet 0   No current facility-administered medications on file prior to visit.    Allergies   Allergen Reactions   Bee Venom Other (See Comments)    Reaction swelling   Amoxicillin Rash   Hydrocodone Other (See Comments)    REACTION: disorientation    Past Medical History:  Diagnosis Date   AAA (abdominal aortic aneurysm) (HCC)    AAA (abdominal aortic aneurysm) (HCC)    4-4 cm intrarenal aaa per US aorta 03-28-2019 epic   Benign positional vertigo    Bladder cancer (HCC)    Bladder neck contracture    Coronary artery disease CARDIOLOGIST--  DR Elease Hashimoto   ED (erectile dysfunction)    GERD (gastroesophageal reflux disease)    H/O hiatal hernia    History of anal fissures    History of colon polyps    History of radiation therapy 08/24/12-10/22/12   prostate 6600cGy/33 sessions--  for biochemical recurrence   Hyperlipidemia    Hypertension    OA (osteoarthritis)    Peripheral vascular disease (HCC)    Recurrent prostate adenocarcinoma (HCC) first dx 09/2005--  radial prostatectomy-- gleason 4+3=7, pT2c, negative margin   04/2012--  BIOCHEMICAL  RECURRENCE to 0.33 /  SALVAGE IMRT SEPT to  NOV 2013   S/P CABG x 2    09/  2014   S/P dilatation of esophageal stricture    1992   Sciatica    both hips at times mostly left hip   Sigmoid diverticulosis    SUI (stress urinary incontinence), male    Urethral stricture    Wears glasses     Past Surgical History:  Procedure Laterality Date   CARDIAC CATHETERIZATION  08-27-2013  DR Swaz[place]and   CRITICAL OSTIAL LM STENOSIS/  diffuse disease LAD 40-50%  &   pLCx 30-40%/   CARDIOVASCULAR STRESS TEST  08-18-2013  DR NASHER   SMALL/ MEDIUM AREA MILD SCAR INFEROSEPTAL WALL/ NO ISCHEMIA/  LOW RISK SCAN/  EF 62%/  LV WALL MOTION WITH SLIGHT DECREASED OF THE SEPTUM   CORONARY ANGIOPLASTY  1991   PTCA  of LEFT CFX   CORONARY ARTERY BYPASS GRAFT N/A 08/27/2013   Procedure: CORONARY ARTERY BYPASS GRAFTING (CABG) times two using left internal mammary and right saphenous vein using endoscope.;  Surgeon: Loreli Slot, MD;  Location: [facility]  OR;  Service: Open Heart Surgery;  Laterality: N/A;   CYSTOSCOPY WITH URETHRAL DILATATION N/A 01/26/2014   Procedure: CYSTOSCOPY WITH URETHRAL DILATATION, BLADDER BIOPSY, FOLEY CATHETER;  Surgeon: Milford Cage, MD;  Location: [facility] ORS;  Service: Urology;  Laterality: N/A;   INGUINAL HERNIA REPAIR Bilateral 1994   KNEE ARTHROSCOPY Right yrs ago   LEFT HEART CATHETERIZATION WITH CORONARY ANGIOGRAM N/A 08/27/2013   Procedure: LEFT HEART CATHETERIZATION WITH CORONARY ANGIOGRAM;  Surgeon: Peter M Swaziland, MD;  Location: [facility] CATH LAB;  Service: Cardiovascular;  Laterality: N/A;   LUMBAR DISC SURGERY  1980'S   RADICAL RETROPUBIC PROSTATECTOMY W/ BILATERAL PELVIC NODE DISSECTION  11-18-2005   TRANSURETHRAL INCISION OF PROSTATE N/A 11/01/2020   Procedure: TRANSURETHRAL INCISION OF THE BLADDER NECK;  Surgeon: Crist Fat, MD;  Location[facility];  Service: Urology;  Laterality: N/A;   URETHROTOMY N/A 05/30/2014   Procedure: CYSTOSCOPY  BALLOON DILATION AND DIRECT VISION INTERNAL URETHROTOMY;  Surgeon: Magdalene Molly, MD;  Location: [facility];  Service: Urology;  Laterality: N/A;    Social History   Tobacco Use  Smoking Status Former   Current  packs/day: 0.00   Average packs/day: 2.0 packs/day for 10.0 years (20.0 ttl pk-yrs)   Types: Cigarettes   Start date: 12/03/1984   Quit date: 12/03/1994   Years since quitting: 28.6  Smokeless Tobacco Never    Social History   Substance and Sexual Activity  Alcohol Use Yes   Comment: occasionally    Family History  Problem Relation Age of Onset   Cancer Father        prostate died 11 something   Cancer Brother        prostate cancer   Arthritis Other    Hyperlipidemia Other    Hypertension Other     Reviw of Systems:   Physical Exam: Blood pressure 110/72, pulse (!) 53, height 5' 6.5" (1.689 m), weight 132 lb 9.6 oz (60.1 kg), SpO2 97%.       GEN:  thin, elderly man.  in no acute  distress HEENT: Normal NECK: No JVD; No carotid bruits LYMPHATICS: No lymphadenopathy CARDIAC: RRR , no murmurs, rubs, gallops RESPIRATORY:  Clear to auscultation without rales, wheezing or rhonchi  ABDOMEN: midline pulsatile abd. Aorta  MUSCULOSKELETAL:  No edema; No deformity  SKIN: Warm and dry NEUROLOGIC:  Alert and oriented x 3     ECG:     EKG Interpretation Date/Time:  Friday July 11 2023 09:29:30 EDT Ventricular Rate:  64 PR Interval:  240 QRS Duration:  78 QT Interval:  428 QTC Calculation: 441 R Axis:   114  Text Interpretation: Unusual P axis, possible ectopic atrial rhythm with undetermined rhythm irregularity Left posterior fascicular block When compared with ECG of 15-Mar-2021 10:24, Ectopic atrial rhythm has replaced Sinus rhythm Confirmed by Kristeen Miss ([id number]) on 07/11/2023 9:38:32 AM     Assessment / Plan:   1. Coronary artery disease-     No angina   2. Hypertension :   Blood pressure and heart rate are on the low side.  Will reduce the carvedilol to 6.25 mg twice a day.   3. Hyperlipidemia-lipids have been well-controlled.  His last LDL is 43.   4. Prostate Cancer.     5. Abdominal aortic aneurism  -  followed by Dr. Edilia Bo    6.  Chronic combined congestive heart failure:    Lungs are clear.  He has stopped his Lasix.  Continue current medications.     Kristeen Miss, MD  07/11/2023 9:51 AM    [facility] [address] [place],  [address] [place], [place]  [address] Pager [phone] Phone: [phone]; Fax: [phone]

## 2023-07-11 ENCOUNTER — Encounter: Payer: Self-pay | Admitting: Cardiovascular Disease

## 2023-07-11 ENCOUNTER — Ambulatory Visit: Payer: Medicare HMO | Attending: Cardiovascular Disease | Admitting: Cardiovascular Disease

## 2023-07-11 ENCOUNTER — Telehealth: Payer: Self-pay

## 2023-07-11 VITALS — BP 110/72 | HR 53 | Ht 66.5 in | Wt 132.6 lb

## 2023-07-11 DIAGNOSIS — I739 Peripheral vascular disease, unspecified: Secondary | ICD-10-CM | POA: Diagnosis not present

## 2023-07-11 DIAGNOSIS — I714 Abdominal aortic aneurysm, without rupture, unspecified: Secondary | ICD-10-CM

## 2023-07-11 DIAGNOSIS — I2510 Atherosclerotic heart disease of native coronary artery without angina pectoris: Secondary | ICD-10-CM | POA: Diagnosis not present

## 2023-07-11 DIAGNOSIS — I10 Essential (primary) hypertension: Secondary | ICD-10-CM

## 2023-07-11 MED ORDER — CARVEDILOL 6.25 MG PO TABS: 6.2500 mg | ORAL_TABLET | Freq: Two times a day (BID) | ORAL | 3 refills | Status: AC

## 2023-07-11 NOTE — Patient Instructions (Signed)
Medication Instructions:  DECREASE Carvedilol/Coreg to 6.25mg  twice daily STOP Furosemide *If you need a refill on your cardiac medications before your next appointment, please call your pharmacy*   Lab Work: NONE If you have labs (blood work) drawn today and your tests are completely normal, you will receive your results only by: MyChart Message (if you have MyChart) OR A paper copy in the mail If you have any lab test that is abnormal or we need to change your treatment, we will call you to review the results.   Testing/Procedures: NONE   Follow-Up: At [facility], you and your health needs are our priority.  As part of our continuing mission to provide you with exceptional heart care, we have created designated Provider Care Teams.  These Care Teams include your primary Cardiologist (physician) and Advanced Practice Providers (APPs -  Physician Assistants and Nurse Practitioners) who all work together to provide you with the care you need, when you need it.  Your next appointment:   1 year(s)  Provider:   Kristeen Miss, MD

## 2023-07-11 NOTE — Telephone Encounter (Signed)
Transition Care Management Unsuccessful Follow-up Telephone Call  Date of discharge and from where:  [facility]  Attempts:  2nd Attempt  Reason for unsuccessful TCM follow-up call:  No answer/busy   Lenard Forth [facility] Guide, [facility] [phone] [address] [place], [place], [place] [address] Phone: [phone] Email: [email or web address]

## 2023-07-17 ENCOUNTER — Ambulatory Visit (HOSPITAL_COMMUNITY)
Admission: RE | Admit: 2023-07-17 | Discharge: 2023-07-17 | Disposition: A | Discharge status: Home / Self Care | Payer: Medicare HMO | Source: Ambulatory Visit | Attending: Vascular Surgery | Admitting: Vascular Surgery

## 2023-07-17 ENCOUNTER — Ambulatory Visit: Payer: Medicare HMO | Admitting: Physician Assistant

## 2023-07-17 VITALS — BP 167/74 | HR 52 | Temp 98.2°F | Resp 18 | Ht 66.5 in | Wt 131.2 lb

## 2023-07-17 DIAGNOSIS — I7143 Infrarenal abdominal aortic aneurysm, without rupture: Secondary | ICD-10-CM | POA: Diagnosis not present

## 2023-07-17 NOTE — Progress Notes (Signed)
[facility] [place] HISTORY AND PHYSICAL   History of Present Illness:  Patient is a 87 y.o. year old male who presents for evaluation of abdominal aortic aneurysm.  The aneurysm was measuring  4.7 cm in diameter by US/CT performed  on 12/19/22.  The patient denies abdominal pain.  The patient denies back pain.    He denies claudication, rest pain or non healing wounds. He does have dementia and is with his daughter today.  He is pleasant and seems happy.  He states he stays busy around his house and walk as far as he wants without calf pain stopping him.  He denies food fears or post prandial pain.    Past Medical History:  Diagnosis Date   AAA (abdominal aortic aneurysm) (HCC)    AAA (abdominal aortic aneurysm) (HCC)    4-4 cm intrarenal aaa per US aorta 03-28-2019 epic   Benign positional vertigo    Bladder cancer (HCC)    Bladder neck contracture    Coronary artery disease CARDIOLOGIST--  DR Elease Hashimoto   ED (erectile dysfunction)    GERD (gastroesophageal reflux disease)    H/O hiatal hernia    History of anal fissures    History of colon polyps    History of radiation therapy 08/24/12-10/22/12   prostate 6600cGy/33 sessions--  for biochemical recurrence   Hyperlipidemia    Hypertension    OA (osteoarthritis)    Peripheral vascular disease (HCC)    Recurrent prostate adenocarcinoma (HCC) first dx 09/2005--  radial prostatectomy-- gleason 4+3=7, pT2c, negative margin   04/2012--  BIOCHEMICAL  RECURRENCE to 0.33 /  SALVAGE IMRT SEPT to  NOV 2013   S/P CABG x 2    09/  2014   S/P dilatation of esophageal stricture    1992   Sciatica    both hips at times mostly left hip   Sigmoid diverticulosis    SUI (stress urinary incontinence), male    Urethral stricture    Wears glasses     Past Surgical History:  Procedure Laterality Date   CARDIAC CATHETERIZATION  08-27-2013  DR Swaziland   CRITICAL OSTIAL LM STENOSIS/  diffuse disease LAD 40-50%  &   pLCx 30-40%/    CARDIOVASCULAR STRESS TEST  08-18-2013  DR NASHER   SMALL/ MEDIUM AREA MILD SCAR INFEROSEPTAL WALL/ NO ISCHEMIA/  LOW RISK SCAN/  EF 62%/  LV WALL MOTION WITH SLIGHT DECREASED OF THE SEPTUM   CORONARY ANGIOPLASTY  1991   PTCA  of LEFT CFX   CORONARY ARTERY BYPASS GRAFT N/A 08/27/2013   Procedure: CORONARY ARTERY BYPASS GRAFTING (CABG) times two using left internal mammary and right saphenous vein using endoscope.;  Surgeon: Loreli Slot, MD;  Location: [facility];  Service: Open Heart Surgery;  Laterality: N/A;   CYSTOSCOPY WITH URETHRAL DILATATION N/A 01/26/2014   Procedure: CYSTOSCOPY WITH URETHRAL DILATATION, BLADDER BIOPSY, FOLEY CATHETER;  Surgeon: Milford Cage, MD;  Location: [facility];  Service: Urology;  Laterality: N/A;   INGUINAL HERNIA REPAIR Bilateral 1994   KNEE ARTHROSCOPY Right yrs ago   LEFT HEART CATHETERIZATION WITH CORONARY ANGIOGRAM N/A 08/27/2013   Procedure: LEFT HEART CATHETERIZATION WITH CORONARY ANGIOGRAM;  Surgeon: Peter M Swaziland, MD;  Location: [facility] CATH LAB;  Service: Cardiovascular;  Laterality: N/A;   LUMBAR DISC SURGERY  1980'S   RADICAL RETROPUBIC PROSTATECTOMY W/ BILATERAL PELVIC NODE DISSECTION  11-18-2005   TRANSURETHRAL INCISION OF PROSTATE N/A 11/01/2020   Procedure: TRANSURETHRAL INCISION OF THE BLADDER NECK;  Surgeon: Crist Fat, MD;  Location: [facility];  Service: Urology;  Laterality: N/A;   URETHROTOMY N/A 05/30/2014   Procedure: CYSTOSCOPY  BALLOON DILATION AND DIRECT VISION INTERNAL URETHROTOMY;  Surgeon: Magdalene Molly, MD;  Location: [facility];  Service: Urology;  Laterality: N/A;     Social History Social History   Tobacco Use   Smoking status: Former    Current packs/day: 0.00    Average packs/day: 2.0 packs/day for 10.0 years (20.0 ttl pk-yrs)    Types: Cigarettes    Start date: 12/03/1984    Quit date: 12/03/1994    Years since quitting: 28.6   Smokeless tobacco: Never  Vaping Use   Vaping  status: Never Used  Substance Use Topics   Alcohol use: Yes    Comment: occasionally   Drug use: No    Family History Family History  Problem Relation Age of Onset   Cancer Father        prostate died 28 something   Cancer Brother        prostate cancer   Arthritis Other    Hyperlipidemia Other    Hypertension Other     Allergies  Allergies  Allergen Reactions   Bee Venom Other (See Comments)    Reaction swelling   Amoxicillin Rash   Hydrocodone Other (See Comments)    REACTION: disorientation     Current Outpatient Medications  Medication Sig Dispense Refill   acetaminophen (TYLENOL) 500 MG tablet Take 500 mg by mouth every 6 (six) hours as needed.     aspirin 81 MG EC tablet Take 1 tablet (81 mg total) by mouth daily.     atorvastatin (LIPITOR) 40 MG tablet TAKE 1 TABLET BY MOUTH EVERY DAY 90 tablet 0   carvedilol (COREG) 6.25 MG tablet Take 1 tablet (6.25 mg total) by mouth 2 (two) times daily. 180 tablet 3   Cyanocobalamin (VITAMIN B12) 1000 MCG TBCR Take 100 mcg by mouth daily.     latanoprost (XALATAN) 0.005 % ophthalmic solution Place 1 drop into both eyes at bedtime.      metoCLOPramide (REGLAN) 10 MG tablet Take 1 tablet (10 mg total) by mouth every 6 (six) hours as needed for nausea (nausea/headache). 6 tablet 0   Multiple Vitamin (MULTIVITAMIN WITH MINERALS) TABS tablet Take 1 tablet by mouth daily.     nitroGLYCERIN (NITROSTAT) 0.4 MG SL tablet PLACE 1 TABLET UNDER THE TONUE EVERY 5 MINUTES AS NEEDED FOR CHEST PAIN. 75 tablet 3   polyethylene glycol (MIRALAX / GLYCOLAX) packet Take 17 g by mouth daily as needed (for constipation).     sertraline (ZOLOFT) 50 MG tablet TAKE 1 TABLET BY MOUTH EVERY DAY 90 tablet 3   No current facility-administered medications for this visit.    ROS:   General:  No weight loss, Fever, chills  HEENT: No recent headaches, no nasal bleeding, no visual changes, no sore throat  Neurologic: No dizziness, blackouts, seizures.  No recent symptoms of stroke or mini- stroke. No recent episodes of slurred speech, or temporary blindness.  Cardiac: No recent episodes of chest pain/pressure, no shortness of breath at rest.  No shortness of breath with exertion.  Denies history of atrial fibrillation or irregular heartbeat  Vascular: No history of rest pain in feet.  No history of claudication.  No history of non-healing ulcer, No history of DVT   Pulmonary: No home oxygen, no productive cough, no hemoptysis,  No asthma or wheezing  Musculoskeletal:  [ ]   Arthritis, [ ]  Low back pain,  [ ]  Joint pain  Hematologic:No history of hypercoagulable state.  No history of easy bleeding.  No history of anemia  Gastrointestinal: No hematochezia or melena,  No gastroesophageal reflux, no trouble swallowing  Urinary: [ ]  chronic Kidney disease, [ ]  on HD - [ ]  MWF or [ ]  TTHS, [ ]  Burning with urination, [ ]  Frequent urination, [ ]  Difficulty urinating;   Skin: No rashes  Psychological: No history of anxiety,  No history of depression   Physical Examination  Vitals:   07/17/23 0954  BP: (!) 167/74  Pulse: (!) 52  Resp: 18  Temp: 98.2 F (36.8 C)  TempSrc: Temporal  SpO2: 100%  Weight: 131 lb 3.2 oz (59.5 kg)  Height: 5' 6.5" (1.689 m)    Body mass index is 20.86 kg/m.  General:  Alert and oriented, no acute distress HEENT: Normal Neck: No bruit or JVD Pulmonary: Clear to auscultation bilaterally Cardiac: Regular Rate and Rhythm without murmur Gastrointestinal: Soft, non-tender, non-distended, no mass, no scars Palpable AAA pulse without pain Skin: No rash Extremity Pulses:radial,  femoral, PT pulses bilaterally.  Doppler signals intact B PT  Musculoskeletal: No deformity or edema  Neurologic: Upper and lower extremity motor 5/5 and symmetric  DATA:     ASAbdominal Aorta Findings:  +-----------+-------+----------+----------+--------+--------+--------+  Location  AP (cm)Trans (cm)PSV  (cm/s)WaveformThrombusComments  +-----------+-------+----------+----------+--------+--------+--------+  Proximal  1.55   1.97      47                                  +-----------+-------+----------+----------+--------+--------+--------+  Mid       2.56   2.92      42                                  +-----------+-------+----------+----------+--------+--------+--------+  Distal    4.62   4.87      18                                  +-----------+-------+----------+----------+--------+--------+--------+  RT CIA Prox1.4    1.3       733                                 +-----------+-------+----------+----------+--------+--------+--------+  LT CIA Prox1.3    1.4       96                                  +-----------+-------+----------+----------+--------+--------+--------+       Summary:  Abdominal Aorta: There is evidence of abnormal dilatation of the distal  Abdominal aorta. The largest aortic measurement is 4.87 cm. The largest  aortic diameter has increased compared to prior exam. Previous diameter  measurement was 4.71 cm obtained on  12/19/2022.   Stenosis: +------------------+-------------+  Location          Stenosis       +------------------+-------------+  Right Common Iliac>50% stenosis  +------------------+-------------+   ASSESSMENT/PLAN:  4.8 CM INFRARENAL ABDOMINAL AORTIC ANEURYSM: His aneurysm has increased only slightly in size over the last 6 months.    We would not consider elective repair and a normal  risk patient unless it reached 5.5 cm in maximum diameter.   He agrees to return in 6 months for repeat AAA duplex.  No restrictions on activities.  If he develops uncontrolled abdominal pain or lumbar pain he should call 911 and report to [facility] ED.       Mosetta Pigeon PA-C [facility] [place] Office: [phone]  MD in clinic [place]

## 2023-07-18 ENCOUNTER — Other Ambulatory Visit: Payer: Self-pay

## 2023-07-18 DIAGNOSIS — I7143 Infrarenal abdominal aortic aneurysm, without rupture: Secondary | ICD-10-CM

## 2023-07-28 DIAGNOSIS — D303 Benign neoplasm of bladder: Secondary | ICD-10-CM | POA: Diagnosis not present

## 2023-07-28 DIAGNOSIS — N320 Bladder-neck obstruction: Secondary | ICD-10-CM | POA: Diagnosis not present

## 2023-07-28 DIAGNOSIS — N390 Urinary tract infection, site not specified: Secondary | ICD-10-CM | POA: Diagnosis not present

## 2023-08-18 ENCOUNTER — Ambulatory Visit: Payer: Medicare HMO

## 2023-08-19 ENCOUNTER — Ambulatory Visit (INDEPENDENT_AMBULATORY_CARE_PROVIDER_SITE_OTHER): Payer: Medicare HMO

## 2023-08-19 DIAGNOSIS — Z23 Encounter for immunization: Secondary | ICD-10-CM | POA: Diagnosis not present

## 2023-08-19 NOTE — Progress Notes (Signed)
Per orders of Dr. Caryl Never, injection of Flu Vaccine  given by Stann Ore. Patient tolerated injection well.

## 2023-10-13 ENCOUNTER — Telehealth: Payer: Self-pay

## 2023-10-13 NOTE — Progress Notes (Signed)
  Care Coordination Note  10/13/2023 Name: [name] MRN: [id number] DOB: [birth year]  [name] is a 87 y.o. year old male who is a primary care patient of Burchette, Elberta Fortis, MD and is actively engaged with the Care Management team. I reached out to [name] by phone today to assist with re-scheduling a follow up visit with the Pharmacist  Follow up plan: Unsuccessful telephone outreach attempt made. A HIPAA compliant phone message was left for the patient providing contact information and requesting a return call.   Penne Lash, RMA Care Guide [facility]  [place], [place] [address] Direct Dial: [phone] [email or web address]

## 2023-10-14 ENCOUNTER — Other Ambulatory Visit: Payer: Medicare HMO

## 2023-10-15 NOTE — Telephone Encounter (Addendum)
Pt called to reschedule this appointment.  Pt was rescheduled for 10/23/23 at 1 pm. If this appointment was made incorrectly, please call Pt directly to reschedule.  Thank you.

## 2023-10-17 ENCOUNTER — Other Ambulatory Visit: Payer: Self-pay | Admitting: Family Medicine

## 2023-10-17 DIAGNOSIS — F4321 Adjustment disorder with depressed mood: Secondary | ICD-10-CM

## 2023-10-23 ENCOUNTER — Other Ambulatory Visit: Payer: Medicare HMO

## 2023-10-23 NOTE — Progress Notes (Deleted)
   10/23/2023  Patient ID: [name], male   DOB: 09/04/1936, 87 y.o.   MRN: [id number]  Attempted to contact patient for scheduled appointment for medication management. Left HIPAA compliant message for patient to return my call at their convenience.   Sherrill Raring, PharmD Clinical Pharmacist [phone]

## 2023-10-28 ENCOUNTER — Other Ambulatory Visit: Payer: Medicare HMO

## 2023-10-28 NOTE — Progress Notes (Signed)
10/28/2023 Name: [name] MRN: [id number] DOB: 25-Nov-1936  Chief Complaint  Patient presents with   Hypertension   Medication Management    [name] is a 87 y.o. year old male who presented for a telephone visit.   They were referred to the pharmacist by their PCP for assistance in managing hypertension.    Subjective:  Care Team: Primary Care Provider: Kristian Covey, MD  Medication Access/Adherence  Current Pharmacy:  [id number] - [place], [place] - [address] AT [facility] [address] [place] [address] Phone: [phone] Fax: [phone]  [id number] - [address], [place] - [address] [place] [address] Phone: [phone] Fax: [phone]   Patient reports affordability concerns with their medications: No  Patient reports access/transportation concerns to their pharmacy: No  Patient reports adherence concerns with their medications:  No     Hypertension:  Current medications:  Carvedilol 6.25mg  BID Medications previously tried: Lasix, Losartan, Metoprolol, Olmesartan  Patient does not have a validated, automated, upper arm home BP cuff Current blood pressure readings readings: none  Patient reports hypotensive s/sx including dizziness, lightheadedness on occasion, states this is a chronic issue for him and could be drinking more water Patient denies hypertensive symptoms including headache, chest pain, shortness of breath   Objective:   Lab Results  Component Value Date   CREATININE 1.32 (H) 07/04/2023   BUN 30 (H) 07/04/2023   NA 140 07/04/2023   K 4.5 07/04/2023   CL 106 07/04/2023   CO2 26 07/04/2023    Lab Results  Component Value Date   CHOL 99 05/19/2023   HDL 42.80 05/19/2023   LDLCALC 43 05/19/2023   TRIG 67.0 05/19/2023   CHOLHDL 2 05/19/2023    Medications Reviewed Today     Reviewed by Sherrill Raring, RPH (Pharmacist) on  10/28/23 at 0941  Med List Status: <None>   Medication Order Taking? Sig Documenting Provider Last Dose Status Informant  acetaminophen (TYLENOL) 500 MG tablet [id number] Yes Take 500 mg by mouth every 6 (six) hours as needed. [provider] Taking Active   aspirin 81 MG EC tablet [id number] Yes Take 1 tablet (81 mg total) by mouth daily. Nahser, Deloris Ping, MD Taking Active Self  atorvastatin (LIPITOR) 40 MG tablet [id number] Yes TAKE 1 TABLET BY MOUTH EVERY DAY Burchette, Elberta Fortis, MD Taking Active   carvedilol (COREG) 6.25 MG tablet [id number] Yes Take 1 tablet (6.25 mg total) by mouth 2 (two) times daily. Nahser, Deloris Ping, MD Taking Active   Cyanocobalamin (VITAMIN B12) 1000 MCG TBCR [id number] Yes Take 100 mcg by mouth daily. [provider] Taking Active   latanoprost (XALATAN) 0.005 % ophthalmic solution [id number] Yes Place 1 drop into both eyes at bedtime.  [provider] Taking Active Self  metoCLOPramide (REGLAN) 10 MG tablet [id number]  Take 1 tablet (10 mg total) by mouth every 6 (six) hours as needed for nausea (nausea/headache). Charlynne Pander, MD  Active   Multiple Vitamin (MULTIVITAMIN WITH MINERALS) TABS tablet [id number] No Take 1 tablet by mouth daily.  Patient not taking: Reported on 10/28/2023   [provider] Not Taking Active Self  nitroGLYCERIN (NITROSTAT) 0.4 MG SL tablet [id number] Yes PLACE 1 TABLET UNDER THE TONUE EVERY 5 MINUTES AS NEEDED FOR CHEST PAIN. Nahser, Deloris Ping, MD Taking Active   polyethylene glycol [facility] / Ethelene Hal) packet [id number] Yes Take 17 g by mouth daily as needed (for constipation).  [provider] Taking Active            Med Note Kelli Churn, [place] Master Dec 20, 2020 11:05 AM)    sertraline (ZOLOFT) 50 MG tablet [id number] Yes TAKE 1 TABLET BY MOUTH EVERY DAY Burchette, Elberta Fortis, MD Taking Active               Assessment/Plan:   Hypertension: - Currently uncontrolled - Reviewed long term  cardiovascular and renal outcomes of uncontrolled blood pressure - Reviewed appropriate blood pressure monitoring technique and reviewed goal blood pressure.  - Recommend to ensure staying hydrated with water -Scheduled follow up office visit to check BP and provide home BP monitoring kit     Follow Up Plan: 11/10/23 at 11:30am in office  Sherrill Raring, PharmD Clinical Pharmacist [phone]

## 2023-11-10 ENCOUNTER — Ambulatory Visit (INDEPENDENT_AMBULATORY_CARE_PROVIDER_SITE_OTHER): Payer: Medicare HMO

## 2023-11-10 VITALS — BP 134/68 | HR 63

## 2023-11-10 DIAGNOSIS — I10 Essential (primary) hypertension: Secondary | ICD-10-CM | POA: Diagnosis not present

## 2023-11-10 NOTE — Progress Notes (Signed)
11/10/2023 Name: [name] MRN: [id number] DOB: [birth year]  Chief Complaint  Patient presents with   Hypertension    [name] is a 87 y.o. year old male who presented for a telephone visit.   They were referred to the pharmacist by their PCP for assistance in managing hypertension.    Subjective:  Care Team: Primary Care Provider: Kristian Covey, MD   Medication Access/Adherence  Current Pharmacy:  [id number] - [place], [place] - [address] AT [facility] [address] [place] [address] Phone: [phone] Fax: [phone]  [id number] - [address], [place] - [address] [place] [address] Phone: [phone] Fax: [phone]   Patient reports affordability concerns with their medications: No  Patient reports access/transportation concerns to their pharmacy: No  Patient reports adherence concerns with their medications:  No     Hypertension:  Current medications: Carvedilol 6.25mg  BID Medications previously tried: Metoprolol, Olmesartan, Amlodipine, Losartan, Lasix  Patient does not have a validated, automated, upper arm home BP cuff Current blood pressure readings readings: 13468 in office today  Patient reports hypotensive s/sx including dizziness (hx of vertigo), upon laying down, resolves in seconds Patient denies hypertensive symptoms including headache, chest pain, shortness of breath  Objective:  No results found for: "HGBA1C"  Lab Results  Component Value Date   CREATININE 1.32 (H) 07/04/2023   BUN 30 (H) 07/04/2023   NA 140 07/04/2023   K 4.5 07/04/2023   CL 106 07/04/2023   CO2 26 07/04/2023    Lab Results  Component Value Date   CHOL 99 05/19/2023   HDL 42.80 05/19/2023   LDLCALC 43 05/19/2023   TRIG 67.0 05/19/2023   CHOLHDL 2 05/19/2023    Medications Reviewed Today     Reviewed by Sherrill Raring, RPH (Pharmacist) on 11/10/23 at  1144  Med List Status: <None>   Medication Order Taking? Sig Documenting Provider Last Dose Status Informant  acetaminophen (TYLENOL) 500 MG tablet [id number]  Take 500 mg by mouth every 6 (six) hours as needed. [provider]  Active   aspirin 81 MG EC tablet [id number]  Take 1 tablet (81 mg total) by mouth daily. Nahser, Deloris Ping, MD  Active Self  atorvastatin (LIPITOR) 40 MG tablet [id number] Yes TAKE 1 TABLET BY MOUTH EVERY DAY Burchette, Elberta Fortis, MD Taking Active   carvedilol (COREG) 6.25 MG tablet [id number] Yes Take 1 tablet (6.25 mg total) by mouth 2 (two) times daily. Nahser, Deloris Ping, MD Taking Active   Cyanocobalamin (VITAMIN B12) 1000 MCG TBCR [id number] Yes Take 100 mcg by mouth daily. [provider] Taking Active   latanoprost (XALATAN) 0.005 % ophthalmic solution [id number] No Place 1 drop into both eyes at bedtime.   Patient not taking: Reported on 11/10/2023   [provider] Not Taking Active Self  Multiple Vitamin (MULTIVITAMIN WITH MINERALS) TABS tablet [id number]  Take 1 tablet by mouth daily.  Patient not taking: Reported on 10/28/2023   [provider]  Active Self  nitroGLYCERIN (NITROSTAT) 0.4 MG SL tablet [id number] Yes PLACE 1 TABLET UNDER THE TONUE EVERY 5 MINUTES AS NEEDED FOR CHEST PAIN. Nahser, Deloris Ping, MD Taking Active   polyethylene glycol [facility] / GLYCOLAX) packet [id number]  Take 17 g by mouth daily as needed (for constipation). [provider]  Active            Med Note Kelli Churn, [place] Master Dec 20, 2020  11:05 AM)    sertraline (ZOLOFT) 50 MG tablet [id number] Yes TAKE 1 TABLET BY MOUTH EVERY DAY Burchette, Elberta Fortis, MD Taking Active               Assessment/Plan:   Hypertension: - Currently controlled - Reviewed long term cardiovascular and renal outcomes of uncontrolled blood pressure - Reviewed appropriate blood pressure monitoring technique and reviewed goal blood pressure. Recommended to check home  blood pressure and heart rate once weekly - Recommend to continue current medication therapy   The patients has a diagnosis of hypertension and it is medically necessary for them to have access to a home device to monitor blood pressure.  The patient does not have readily available insurance access to a device and cannot afford to purchase a device at this time.  The patient has been counseled that they do not need to continue to receive services from [facility] to receive a device.  The patient will be given a device free of charge.   Follow Up Plan: 1 month  Sherrill Raring, PharmD Clinical Pharmacist [phone]

## 2023-11-14 ENCOUNTER — Other Ambulatory Visit: Payer: Self-pay | Admitting: Family Medicine

## 2023-11-14 DIAGNOSIS — I2510 Atherosclerotic heart disease of native coronary artery without angina pectoris: Secondary | ICD-10-CM

## 2023-12-10 ENCOUNTER — Other Ambulatory Visit: Payer: Medicare HMO

## 2023-12-10 NOTE — Progress Notes (Signed)
 12/10/2023 Name: [name] MRN: [id number] DOB: 04-02-1936  Chief Complaint  Patient presents with   Hypertension    [name] is a 88 y.o. year old male who presented for a telephone visit.   They were referred to the pharmacist by their PCP for assistance in managing hypertension.    Subjective:  Care Team: Primary Care Provider: Micheal Wolm ORN, MD , Next Follow Up 12/17/23  Medication Access/Adherence  [phone] [place], [place] - [address] [place] [address] Phone: [phone] Fax: [phone]   Patient reports affordability concerns with their medications: No  Patient reports access/transportation concerns to their pharmacy: No  Patient reports adherence concerns with their medications:  No     Hypertension:  Current medications: Carvedilol  6.25mg  BID Medications previously tried: Metoprolol , Olmesartan , Amlodipine , Losartan , Lasix   Patient does not have a validated, automated, upper arm home BP cuff Current blood pressure readings readings: 139/67 in office today  Patient reports hypotensive s/sx including dizziness (hx of vertigo), upon laying down, resolves in seconds Patient denies hypertensive symptoms including headache, chest pain, shortness of breath  Objective:  No results found for: HGBA1C  Lab Results  Component Value Date   CREATININE 1.32 (H) 07/04/2023   BUN 30 (H) 07/04/2023   NA 140 07/04/2023   K 4.5 07/04/2023   CL 106 07/04/2023   CO2 26 07/04/2023    Lab Results  Component Value Date   CHOL 99 05/19/2023   HDL 42.80 05/19/2023   LDLCALC 43 05/19/2023   TRIG 67.0 05/19/2023   CHOLHDL 2 05/19/2023    Medications Reviewed Today     Reviewed by Lionell Jon DEL, RPH (Pharmacist) on 12/10/23 at 1342  Med List Status: <None>   Medication Order Taking? Sig Documenting Provider Last Dose Status Informant  acetaminophen  (TYLENOL ) 500 MG tablet  [id number] Yes Take 500 mg by mouth every 6 (six) hours as needed. [provider] Taking Active   aspirin  81 MG EC tablet [id number] Yes Take 1 tablet (81 mg total) by mouth daily. Nahser, Aleene PARAS, MD Taking Active Self  atorvastatin  (LIPITOR) 40 MG tablet [id number] Yes TAKE 1 TABLET BY MOUTH EVERY DAY Burchette, Wolm ORN, MD Taking Active   carvedilol  (COREG ) 6.25 MG tablet [id number] Yes Take 1 tablet (6.25 mg total) by mouth 2 (two) times daily. Nahser, Aleene PARAS, MD Taking Active   Cyanocobalamin  (VITAMIN B12) 1000 MCG TBCR [id number] Yes Take 100 mcg by mouth daily. [provider] Taking Active   latanoprost (XALATAN) 0.005 % ophthalmic solution [id number] Yes Place 1 drop into both eyes at bedtime. [provider] Taking Active Self  Multiple Vitamin (MULTIVITAMIN WITH MINERALS) TABS tablet [id number]  Take 1 tablet by mouth daily.  Patient not taking: Reported on 10/28/2023   [provider]  Active Self  nitroGLYCERIN  (NITROSTAT ) 0.4 MG SL tablet [id number]  PLACE 1 TABLET UNDER THE TONUE EVERY 5 MINUTES AS NEEDED FOR CHEST PAIN. Nahser, Aleene PARAS, MD  Active   polyethylene glycol (MIRALAX  / GLYCOLAX ) packet [id number] Yes Take 17 g by mouth daily as needed (for constipation). [provider] Taking Active            Med Note LULA CLOTILDA CHRISTELLA Stevan Dec 20, 2020 11:05 AM)    sertraline  (ZOLOFT ) 50 MG tablet [id number] Yes TAKE 1 TABLET BY MOUTH EVERY DAY Burchette, Wolm ORN, MD Taking Active  Assessment/Plan:   Hypertension: - Currently controlled - Reviewed long term cardiovascular and renal outcomes of uncontrolled blood pressure - Reviewed appropriate blood pressure monitoring technique and reviewed goal blood pressure. Recommended to check home blood pressure and heart rate once weekly - Recommend to continue current medication therapy   Patient does voice concerns over memory loss. Scheduled follow up with PCP as patient was  past due.    Follow Up Plan: 1 week with PCP, follow up after as indicated  Jon VEAR [place], PharmD Clinical Pharmacist [phone]

## 2023-12-16 DIAGNOSIS — H5213 Myopia, bilateral: Secondary | ICD-10-CM | POA: Diagnosis not present

## 2023-12-16 DIAGNOSIS — H401131 Primary open-angle glaucoma, bilateral, mild stage: Secondary | ICD-10-CM | POA: Diagnosis not present

## 2023-12-16 DIAGNOSIS — H18513 Endothelial corneal dystrophy, bilateral: Secondary | ICD-10-CM | POA: Diagnosis not present

## 2023-12-16 DIAGNOSIS — Z961 Presence of intraocular lens: Secondary | ICD-10-CM | POA: Diagnosis not present

## 2023-12-17 ENCOUNTER — Telehealth: Payer: Self-pay

## 2023-12-17 ENCOUNTER — Encounter: Payer: Self-pay | Admitting: Family Medicine

## 2023-12-17 ENCOUNTER — Ambulatory Visit (INDEPENDENT_AMBULATORY_CARE_PROVIDER_SITE_OTHER): Payer: Medicare HMO | Admitting: Family Medicine

## 2023-12-17 VITALS — BP 120/60 | HR 62 | Temp 97.7°F | Wt 130.2 lb

## 2023-12-17 DIAGNOSIS — R4189 Other symptoms and signs involving cognitive functions and awareness: Secondary | ICD-10-CM | POA: Diagnosis not present

## 2023-12-17 DIAGNOSIS — I10 Essential (primary) hypertension: Secondary | ICD-10-CM

## 2023-12-17 DIAGNOSIS — I714 Abdominal aortic aneurysm, without rupture, unspecified: Secondary | ICD-10-CM | POA: Diagnosis not present

## 2023-12-17 DIAGNOSIS — N183 Chronic kidney disease, stage 3 unspecified: Secondary | ICD-10-CM | POA: Diagnosis not present

## 2023-12-17 NOTE — Progress Notes (Signed)
 Established Patient Office Visit  Subjective   Patient ID: [name], male    DOB: 03/13/1936  Age: 88 y.o. MRN: [id number]  Chief Complaint  Patient presents with   Memory Loss    HPI    [name] is seen today for medical follow-up.  He has chronic problems including abdominal aortic aneurysm, CAD, hypertension, history of GERD, chronic kidney disease stage III, history of prostate cancer, cognitive impairment.  He lives alone.  He still drives but usually only short distances.  He has a daughter [name] who lives nearby and another daughter who lives in [place] .  Patient had a fall last summer with very small left parietal bleed.  No headaches since then.  He has known cognitive impairment.  MMSE 27/31 May 2022.  Most recent B12 and TSH were normal.  MRI per neurology back in 2018.  Has declined medication such as Aricept  and Namenda  in the past.  His current medications include sertraline  50 mg daily, carvedilol  6.25 mg twice daily, atorvastatin  40 mg daily, and aspirin  81 mg daily.  He states he is compliant with therapy.  Lipids were checked in June with LDL cholesterol 43  Denies any recent chest pains.  No falls since last visit.  He is getting meals delivered to him throughout the week.  Does not prepare a lot of foods himself.  Past Medical History:  Diagnosis Date   AAA (abdominal aortic aneurysm) (HCC)    AAA (abdominal aortic aneurysm) (HCC)    4-4 cm intrarenal aaa per us  aorta 03-28-2019 epic   Benign positional vertigo    Bladder cancer (HCC)    Bladder neck contracture    Coronary artery disease CARDIOLOGIST--  DR Alroy Aspen   ED (erectile dysfunction)    GERD (gastroesophageal reflux disease)    H/O hiatal hernia    History of anal fissures    History of colon polyps    History of radiation therapy 08/24/12-10/22/12   prostate 6600cGy/33 sessions--  for biochemical recurrence   Hyperlipidemia    Hypertension    OA (osteoarthritis)    Peripheral vascular  disease (HCC)    Recurrent prostate adenocarcinoma (HCC) first dx 09/2005--  radial prostatectomy-- gleason 4+3=7, pT2c, negative margin   04/2012--  BIOCHEMICAL  RECURRENCE to 0.33 /  SALVAGE IMRT SEPT to  NOV 2013   S/P CABG x 2    09/  2014   S/P dilatation of esophageal stricture    1992   Sciatica    both hips at times mostly left hip   Sigmoid diverticulosis    SUI (stress urinary incontinence), male    Urethral stricture    Wears glasses    Past Surgical History:  Procedure Laterality Date   CARDIAC CATHETERIZATION  08-27-2013  DR [place]   CRITICAL OSTIAL LM STENOSIS/  diffuse disease LAD 40-50%  &   pLCx 30-40%/   CARDIOVASCULAR STRESS TEST  08-18-2013  DR NASHER   SMALL/ MEDIUM AREA MILD SCAR INFEROSEPTAL WALL/ NO ISCHEMIA/  LOW RISK SCAN/  EF 62%/  LV WALL MOTION WITH SLIGHT DECREASED OF THE SEPTUM   CORONARY ANGIOPLASTY  1991   PTCA  of LEFT CFX   CORONARY ARTERY BYPASS GRAFT N/A 08/27/2013   Procedure: CORONARY ARTERY BYPASS GRAFTING (CABG) times two using left internal mammary and right saphenous vein using endoscope.;  Surgeon: Zelphia Higashi, MD;  Location: [facility];  Service: Open Heart Surgery;  Laterality: N/A;   CYSTOSCOPY WITH URETHRAL DILATATION N/A 01/26/2014  Procedure: CYSTOSCOPY WITH URETHRAL DILATATION, BLADDER BIOPSY, FOLEY CATHETER;  Surgeon: Soledad Dupes, MD;  Location: [facility];  Service: Urology;  Laterality: N/A;   INGUINAL HERNIA REPAIR Bilateral 1994   KNEE ARTHROSCOPY Right yrs ago   LEFT HEART CATHETERIZATION WITH CORONARY ANGIOGRAM N/A 08/27/2013   Procedure: LEFT HEART CATHETERIZATION WITH CORONARY ANGIOGRAM;  Surgeon: Peter M Swaziland, MD;  Location: [facility] CATH LAB;  Service: Cardiovascular;  Laterality: N/A;   LUMBAR DISC SURGERY  1980'S   RADICAL RETROPUBIC PROSTATECTOMY W/ BILATERAL PELVIC NODE DISSECTION  11-18-2005   TRANSURETHRAL INCISION OF PROSTATE N/A 11/01/2020   Procedure: TRANSURETHRAL INCISION OF THE BLADDER NECK;  Surgeon:  Andrez Banker, MD;  Location: [facility];  Service: Urology;  Laterality: N/A;   URETHROTOMY N/A 05/30/2014   Procedure: CYSTOSCOPY  BALLOON DILATION AND DIRECT VISION INTERNAL URETHROTOMY;  Surgeon: Carmon Christen, MD;  Location: [facility];  Service: Urology;  Laterality: N/A;    reports that he quit smoking about 29 years ago. His smoking use included cigarettes. He started smoking about 39 years ago. He has a 20 pack-year smoking history. He has never used smokeless tobacco. He reports current alcohol use. He reports that he does not use drugs. family history includes Arthritis in an other family member; Cancer in his brother and father; Hyperlipidemia in an other family member; Hypertension in an other family member. Allergies  Allergen Reactions   Bee Venom Other (See Comments)    Reaction swelling   Amoxicillin  Rash   Hydrocodone Other (See Comments)    REACTION: disorientation    Review of Systems  Constitutional:  Negative for chills, fever and malaise/fatigue.  Eyes:  Negative for blurred vision.  Respiratory:  Negative for shortness of breath.   Cardiovascular:  Negative for chest pain.  Neurological:  Negative for dizziness, weakness and headaches.      Objective:     BP 120/60 (BP Location: Left Arm, Patient Position: Sitting, Cuff Size: Normal)   Pulse 62   Temp 97.7 F (36.5 C) (Oral)   Wt 130 lb 3.2 oz (59.1 kg)   SpO2 97%   BMI 20.70 kg/m  BP Readings from Last 3 Encounters:  12/17/23 120/60  11/10/23 134/68  07/17/23 (!) 167/74   Wt Readings from Last 3 Encounters:  12/17/23 130 lb 3.2 oz (59.1 kg)  07/17/23 131 lb 3.2 oz (59.5 kg)  07/11/23 132 lb 9.6 oz (60.1 kg)      Physical Exam Vitals reviewed.  Constitutional:      Appearance: He is well-developed.  Eyes:     Pupils: Pupils are equal, round, and reactive to light.  Neck:     Thyroid : No thyromegaly.  Cardiovascular:     Rate and Rhythm: Normal  rate and regular rhythm.  Pulmonary:     Effort: Pulmonary effort is normal. No respiratory distress.     Breath sounds: Normal breath sounds. No wheezing or rales.  Musculoskeletal:     Cervical back: Neck supple.  Neurological:     Mental Status: He is alert.     Comments: MMSE 20/30 which is down from 27/30 back in June 2023.      No results found for any visits on 12/17/23.  Last CBC Lab Results  Component Value Date   WBC 5.4 07/04/2023   HGB 10.9 (L) 07/04/2023   HCT 34.8 (L) 07/04/2023   MCV 113.7 (H) 07/04/2023   MCH 35.6 (H) 07/04/2023   RDW 13.0 07/04/2023  PLT 106 (L) 07/04/2023   Last metabolic panel Lab Results  Component Value Date   GLUCOSE 102 (H) 07/04/2023   NA 140 07/04/2023   K 4.5 07/04/2023   CL 106 07/04/2023   CO2 26 07/04/2023   BUN 30 (H) 07/04/2023   CREATININE 1.32 (H) 07/04/2023   GFRNONAA 53 (L) 07/04/2023   CALCIUM  9.4 07/04/2023   PROT 7.4 05/19/2023   ALBUMIN  4.3 05/19/2023   LABGLOB 2.4 01/13/2017   AGRATIO 1.9 01/13/2017   BILITOT 0.7 05/19/2023   ALKPHOS 58 05/19/2023   AST 20 05/19/2023   ALT 19 05/19/2023   ANIONGAP 8 07/04/2023   Last lipids Lab Results  Component Value Date   CHOL 99 05/19/2023   HDL 42.80 05/19/2023   LDLCALC 43 05/19/2023   TRIG 67.0 05/19/2023   CHOLHDL 2 05/19/2023   Last thyroid  functions Lab Results  Component Value Date   TSH 1.37 05/03/2022   Last vitamin B12 and Folate Lab Results  Component Value Date   VITAMINB12 1,483 (H) 05/03/2022      The ASCVD Risk score (Arnett DK, et al., 2019) failed to calculate for the following reasons:   The 2019 ASCVD risk score is only valid for ages 65 to 62    Assessment & Plan:   #1 cognitive impairment.  This has been labeled as mild in the past but he scored 20 out of 30 today on MMSE.  Does not seem to have a lot of insight regarding his cognitive impairment.  We have concerns regarding ongoing ability to manage day-to-day affairs as his  condition worsens.  Will discuss with his daughter [name] who lives locally.  Patient declines medications such as Aricept  or Namenda .  He has had multiple CNS images in the past including most recently CAT scan after his fall back in August.  MRI 2018.  #2 history of CAD/hyperlipidemia.  Patient on high-dose atorvastatin .  Lipids were checked in June.  Continue atorvastatin  40 mg daily and aspirin  81 mg daily along with carvedilol  6.25 mg twice daily  #3 abdominal aortic aneurysm.  Patient has scheduled follow-up imaging in February and is reminded about that  #4 chronic kidney disease stage III.  Continue to avoid nonsteroidals.  Stressed importance of good hydration.  Check renal profile at follow-up labs  Glean Lamy, MD

## 2023-12-17 NOTE — Telephone Encounter (Signed)
 Copied from CRM [id number]. Topic: General - Other >> Dec 17, 2023  4:14 PM Chantha C wrote: Reason for CRM: Patient's daugher Adah Acron [phone] is returning the office call, no messages was left, so Adah Acron does not know what the call was for. Please call back.Aaron Aas

## 2023-12-18 NOTE — Telephone Encounter (Signed)
I spoke with his daughter, Arline Asp, yesterday.   She also has concerns regarding his cognitive status, but family want him to stay relatively independent as long as possible.   He is declining further medication.  Discussed that he will certainly need more oversight (meals, financial, etc going forward).   Discussed several general safety concerns (eg driving, firearms, stove, etc).  Daughter will plan to come back with him at follow up in about 2-3 months.  Bruce Covey MD [facility]

## 2024-01-06 NOTE — Progress Notes (Signed)
 HISTORY AND PHYSICAL     CC:  follow up. Requesting Provider:  Micheal Wolm ORN, MD  HPI: This is a 88 y.o. male who is here today for follow up for AAA.  He denies any new or changing abdominal or back pain.  He is ambulatory without claudication, rest pain, or tissue loss of bilateral lower extremities.  Previously there has been mention of right common iliac artery stenosis by duplex.  He also denies any hip or buttock claudication.  He takes a daily statin and aspirin .  He denies tobacco use.   He states he has been dizzy when for standing or getting out of bed.  He has not been drinking any water .  He drinks coffee daily.  He has not yet notified his PCP.  Past Medical History:  Diagnosis Date   AAA (abdominal aortic aneurysm) (HCC)    AAA (abdominal aortic aneurysm) (HCC)    4-4 cm intrarenal aaa per us  aorta 03-28-2019 epic   Benign positional vertigo    Bladder cancer (HCC)    Bladder neck contracture    Coronary artery disease CARDIOLOGIST--  DR ALVETA   ED (erectile dysfunction)    GERD (gastroesophageal reflux disease)    H/O hiatal hernia    History of anal fissures    History of colon polyps    History of radiation therapy 08/24/12-10/22/12   prostate 6600cGy/33 sessions--  for biochemical recurrence   Hyperlipidemia    Hypertension    OA (osteoarthritis)    Peripheral vascular disease (HCC)    Recurrent prostate adenocarcinoma (HCC) first dx 09/2005--  radial prostatectomy-- gleason 4+3=7, pT2c, negative margin   04/2012--  BIOCHEMICAL  RECURRENCE to 0.33 /  SALVAGE IMRT SEPT to  NOV 2013   S/P CABG x 2    09/  2014   S/P dilatation of esophageal stricture    1992   Sciatica    both hips at times mostly left hip   Sigmoid diverticulosis    SUI (stress urinary incontinence), male    Urethral stricture    Wears glasses     Past Surgical History:  Procedure Laterality Date   CARDIAC CATHETERIZATION  08-27-2013  DR JORDAN   CRITICAL OSTIAL LM STENOSIS/   diffuse disease LAD 40-50%  &   pLCx 30-40%/   CARDIOVASCULAR STRESS TEST  08-18-2013  DR NASHER   SMALL/ MEDIUM AREA MILD SCAR INFEROSEPTAL WALL/ NO ISCHEMIA/  LOW RISK SCAN/  EF 62%/  LV WALL MOTION WITH SLIGHT DECREASED OF THE SEPTUM   CORONARY ANGIOPLASTY  1991   PTCA  of LEFT CFX   CORONARY ARTERY BYPASS GRAFT N/A 08/27/2013   Procedure: CORONARY ARTERY BYPASS GRAFTING (CABG) times two using left internal mammary and right saphenous vein using endoscope.;  Surgeon: Elspeth JAYSON Millers, MD;  Location: [facility];  Service: Open Heart Surgery;  Laterality: N/A;   CYSTOSCOPY WITH URETHRAL DILATATION N/A 01/26/2014   Procedure: CYSTOSCOPY WITH URETHRAL DILATATION, BLADDER BIOPSY, FOLEY CATHETER;  Surgeon: Toribio Neysa Repine, MD;  Location: [facility];  Service: Urology;  Laterality: N/A;   INGUINAL HERNIA REPAIR Bilateral 1994   KNEE ARTHROSCOPY Right yrs ago   LEFT HEART CATHETERIZATION WITH CORONARY ANGIOGRAM N/A 08/27/2013   Procedure: LEFT HEART CATHETERIZATION WITH CORONARY ANGIOGRAM;  Surgeon: Peter M Jordan, MD;  Location: [facility] CATH LAB;  Service: Cardiovascular;  Laterality: N/A;   LUMBAR DISC SURGERY  1980'S   RADICAL RETROPUBIC PROSTATECTOMY W/ BILATERAL PELVIC NODE DISSECTION  11-18-2005   TRANSURETHRAL INCISION  OF PROSTATE N/A 11/01/2020   Procedure: TRANSURETHRAL INCISION OF THE BLADDER NECK;  Surgeon: Cam Morene ORN, MD;  Location: [facility];  Service: Urology;  Laterality: N/A;   URETHROTOMY N/A 05/30/2014   Procedure: CYSTOSCOPY  BALLOON DILATION AND DIRECT VISION INTERNAL URETHROTOMY;  Surgeon: Toribio CINDERELLA Repine, MD;  Location: [facility];  Service: Urology;  Laterality: N/A;    Allergies  Allergen Reactions   Bee Venom Other (See Comments)    Reaction swelling   Amoxicillin  Rash   Hydrocodone Other (See Comments)    REACTION: disorientation    Current Outpatient Medications  Medication Sig Dispense Refill   acetaminophen  (TYLENOL ) 500 MG  tablet Take 500 mg by mouth every 6 (six) hours as needed.     aspirin  81 MG EC tablet Take 1 tablet (81 mg total) by mouth daily.     atorvastatin  (LIPITOR) 40 MG tablet TAKE 1 TABLET BY MOUTH EVERY DAY 90 tablet 1   carvedilol  (COREG ) 6.25 MG tablet Take 1 tablet (6.25 mg total) by mouth 2 (two) times daily. 180 tablet 3   Cyanocobalamin  (VITAMIN B12) 1000 MCG TBCR Take 100 mcg by mouth daily.     latanoprost (XALATAN) 0.005 % ophthalmic solution Place 1 drop into both eyes at bedtime.     Multiple Vitamin (MULTIVITAMIN WITH MINERALS) TABS tablet Take 1 tablet by mouth daily.     nitroGLYCERIN  (NITROSTAT ) 0.4 MG SL tablet PLACE 1 TABLET UNDER THE TONUE EVERY 5 MINUTES AS NEEDED FOR CHEST PAIN. 75 tablet 3   polyethylene glycol (MIRALAX  / GLYCOLAX ) packet Take 17 g by mouth daily as needed (for constipation).     sertraline  (ZOLOFT ) 50 MG tablet TAKE 1 TABLET BY MOUTH EVERY DAY 90 tablet 1   No current facility-administered medications for this visit.    Family History  Problem Relation Age of Onset   Cancer Father        prostate died 50 something   Cancer Brother        prostate cancer   Arthritis Other    Hyperlipidemia Other    Hypertension Other     Social History   Socioeconomic History   Marital status: Single    Spouse name: Not on file   Number of children: 2   Years of education: 13   Highest education level: Not on file  Occupational History   Occupation: Retired    Associate Professor: RETIRED  Tobacco Use   Smoking status: Former    Current packs/day: 0.00    Average packs/day: 2.0 packs/day for 10.0 years (20.0 ttl pk-yrs)    Types: Cigarettes    Start date: 12/03/1984    Quit date: 12/03/1994    Years since quitting: 29.1   Smokeless tobacco: Never  Vaping Use   Vaping status: Never Used  Substance and Sexual Activity   Alcohol use: Yes    Comment: occasionally   Drug use: No   Sexual activity: Not on file  Other Topics Concern   Not on file  Social History  Narrative   Lives alone   Caffeine use: Coffee- 3 cups daily   Soda sometimes.    Social Drivers of Corporate Investment Banker Strain: Low Risk  (01/22/2023)   Overall Financial Resource Strain (CARDIA)    Difficulty of Paying Living Expenses: Not very hard  Food Insecurity: No Food Insecurity (01/22/2023)   Hunger Vital Sign    Worried About Running Out of Food in the Last Year:  Never true    Ran Out of Food in the Last Year: Never true  Transportation Needs: No Transportation Needs (01/22/2023)   PRAPARE - Administrator, Civil Service (Medical): No    Lack of Transportation (Non-Medical): No  Physical Activity: Inactive (06/18/2023)   Exercise Vital Sign    Days of Exercise per Week: 0 days    Minutes of Exercise per Session: 0 min  Stress: No Stress Concern Present (06/18/2023)   [employer] of Occupational Health - Occupational Stress Questionnaire    Feeling of Stress : Not at all  Social Connections: Unknown (06/18/2023)   Social Connection and Isolation Panel [NHANES]    Frequency of Communication with Friends and Family: Three times a week    Frequency of Social Gatherings with Friends and Family: Three times a week    Attends Religious Services: Never    Active Member of Clubs or Organizations: No    Attends Banker Meetings: Never    Marital Status: Patient unable to answer  Intimate Partner Violence: Patient Unable To Answer (06/18/2023)   Humiliation, Afraid, Rape, and Kick questionnaire    Fear of Current or Ex-Partner: Patient unable to answer    Emotionally Abused: Patient unable to answer    Physically Abused: Patient unable to answer    Sexually Abused: Patient unable to answer     REVIEW OF SYSTEMS:   [X]  denotes positive finding, [ ]  denotes negative finding Cardiac  Comments:  Chest pain or chest pressure:    Shortness of breath upon exertion:    Short of breath when lying flat:    Irregular heart rhythm:        Vascular     Pain in calf, thigh, or hip brought on by ambulation:    Pain in feet at night that wakes you up from your sleep:     Blood clot in your veins:    Leg swelling:         Pulmonary    Oxygen at home:    Productive cough:     Wheezing:         Neurologic    Sudden weakness in arms or legs:     Sudden numbness in arms or legs:     Sudden onset of difficulty speaking or slurred speech:    Temporary loss of vision in one eye:     Problems with dizziness:         Gastrointestinal    Blood in stool:     Vomited blood:         Genitourinary    Burning when urinating:     Blood in urine:        Psychiatric    Major depression:         Hematologic    Bleeding problems:    Problems with blood clotting too easily:        Skin    Rashes or ulcers:        Constitutional    Fever or chills:      PHYSICAL EXAMINATION:  Vitals:   01/07/24 0950  BP: (!) 144/75  Pulse: (!) 56  Resp: 18  Temp: 97.9 F (36.6 C)  SpO2: 98%     General:  WDWN in NAD; vital signs documented above Gait: Not observed HENT: WNL, normocephalic Pulmonary: normal non-labored breathing  Cardiac: regular HR Skin: without rashes Vascular Exam/Pulses:  Right Left  Radial 2+ (normal) 2+ (normal)  DP  absent 1+ (weak)  PT 1+ (weak) 1+ (weak)   Extremities: without ischemic changes, without Gangrene , without cellulitis; without open wounds Musculoskeletal: no muscle wasting or atrophy  Neurologic: A&O X 3;  No focal weakness or paresthesias are detected Psychiatric:  The pt has Normal affect.   Non-Invasive Vascular Imaging:   AAA Arterial duplex on 01/07/2024: 4.9 cm AAA at largest diameter  Previous AAA arterial duplex on 07/17/2023: Abdominal Aorta Findings:  +-----------+-------+----------+----------+--------+--------+--------+  Location  AP (cm)Trans (cm)PSV (cm/s)WaveformThrombusComments  +-----------+-------+----------+----------+--------+--------+--------+  Proximal  1.55    1.97      47                                  +-----------+-------+----------+----------+--------+--------+--------+  Mid       2.56   2.92      42                                  +-----------+-------+----------+----------+--------+--------+--------+  Distal    4.62   4.87      18                                  +-----------+-------+----------+----------+--------+--------+--------+  RT CIA Prox1.4    1.3       733                                 +-----------+-------+----------+----------+--------+--------+--------+  LT CIA Prox1.3    1.4       96                                  +-----------+-------+----------+----------+--------+--------+--------+   Summary:  Abdominal Aorta: There is evidence of abnormal dilatation of the distal  Abdominal aorta. The largest aortic measurement is 4.87 cm. The largest aortic diameter has increased compared to prior exam. Previous diameter measurement was 4.71 cm obtained on 12/19/2022     ASSESSMENT/PLAN:: 88 y.o. male here for follow up for AAA  Subjectively, he has not experienced any changing or new abdominal or back pain.  AAA size has been stable over the past 6 months at 4.9 cm.  He is aware we would not consider elective repair unless aneurysm measured 5.5 cm.  We will repeat AAA duplex in another 6 months.  There has been mention of right common iliac artery stenosis in the past.  On exam he has a palpable PT pulse.  He is also without claudication symptoms of the right lower extremity.  No indication for further workup or intervention at this time.  Mr. Parmelee mentioned that he has been experiencing dizziness when first getting out of bed in the morning or when going from sitting to standing.  He has not yet notified his PCP.  His vital signs are stable today.  Admittedly he has not been drinking any water  during the days.  He does drink coffee on a daily basis.  I recommended limiting diuretics and increasing  his water  intake to see if this helps.  He will notify his PCP if symptoms continue.   Donnice Sender, PA-C [facility] [phone] 01/07/2024  10:16 AM   Clinic MD:   Sheree

## 2024-01-07 ENCOUNTER — Ambulatory Visit (HOSPITAL_COMMUNITY)
Admission: RE | Admit: 2024-01-07 | Discharge: 2024-01-07 | Disposition: A | Discharge status: Home / Self Care | Payer: Medicare HMO | Source: Ambulatory Visit | Attending: Vascular Surgery | Admitting: Vascular Surgery

## 2024-01-07 ENCOUNTER — Ambulatory Visit (INDEPENDENT_AMBULATORY_CARE_PROVIDER_SITE_OTHER): Payer: Medicare HMO | Admitting: Physician Assistant

## 2024-01-07 VITALS — BP 144/75 | HR 56 | Temp 97.9°F | Resp 18 | Ht 66.5 in | Wt 130.8 lb

## 2024-01-07 DIAGNOSIS — I739 Peripheral vascular disease, unspecified: Secondary | ICD-10-CM | POA: Diagnosis not present

## 2024-01-07 DIAGNOSIS — I7143 Infrarenal abdominal aortic aneurysm, without rupture: Secondary | ICD-10-CM | POA: Diagnosis not present

## 2024-01-07 DIAGNOSIS — R42 Dizziness and giddiness: Secondary | ICD-10-CM | POA: Diagnosis not present

## 2024-01-07 NOTE — Progress Notes (Signed)
 Aortic arterial duplex completed. Please see CV Procedures for preliminary results.  [name] Heimlich, RVT 01/07/24 9:43 AM

## 2024-01-19 ENCOUNTER — Other Ambulatory Visit: Payer: Self-pay

## 2024-01-19 DIAGNOSIS — I7143 Infrarenal abdominal aortic aneurysm, without rupture: Secondary | ICD-10-CM

## 2024-01-23 DIAGNOSIS — E785 Hyperlipidemia, unspecified: Secondary | ICD-10-CM | POA: Diagnosis not present

## 2024-01-23 DIAGNOSIS — R32 Unspecified urinary incontinence: Secondary | ICD-10-CM | POA: Diagnosis not present

## 2024-01-23 DIAGNOSIS — I719 Aortic aneurysm of unspecified site, without rupture: Secondary | ICD-10-CM | POA: Diagnosis not present

## 2024-01-23 DIAGNOSIS — F419 Anxiety disorder, unspecified: Secondary | ICD-10-CM | POA: Diagnosis not present

## 2024-01-23 DIAGNOSIS — I10 Essential (primary) hypertension: Secondary | ICD-10-CM | POA: Diagnosis not present

## 2024-01-23 DIAGNOSIS — Z8249 Family history of ischemic heart disease and other diseases of the circulatory system: Secondary | ICD-10-CM | POA: Diagnosis not present

## 2024-01-23 DIAGNOSIS — I2510 Atherosclerotic heart disease of native coronary artery without angina pectoris: Secondary | ICD-10-CM | POA: Diagnosis not present

## 2024-01-23 DIAGNOSIS — I739 Peripheral vascular disease, unspecified: Secondary | ICD-10-CM | POA: Diagnosis not present

## 2024-01-23 DIAGNOSIS — Z87891 Personal history of nicotine dependence: Secondary | ICD-10-CM | POA: Diagnosis not present

## 2024-01-23 DIAGNOSIS — K219 Gastro-esophageal reflux disease without esophagitis: Secondary | ICD-10-CM | POA: Diagnosis not present

## 2024-01-23 DIAGNOSIS — F325 Major depressive disorder, single episode, in full remission: Secondary | ICD-10-CM | POA: Diagnosis not present

## 2024-01-23 DIAGNOSIS — M1990 Unspecified osteoarthritis, unspecified site: Secondary | ICD-10-CM | POA: Diagnosis not present

## 2024-01-23 DIAGNOSIS — Z008 Encounter for other general examination: Secondary | ICD-10-CM | POA: Diagnosis not present

## 2024-02-02 ENCOUNTER — Telehealth: Payer: Self-pay

## 2024-02-02 NOTE — Telephone Encounter (Signed)
 I have not attempted to contact patient at this time

## 2024-02-02 NOTE — Telephone Encounter (Signed)
 Copied from CRM [id number]. Topic: General - Call Back - No Documentation >> Feb 02, 2024  2:22 PM Florestine Avers wrote: Reason for CRM: Patient had a missed call.

## 2024-02-10 ENCOUNTER — Telehealth: Payer: Self-pay | Admitting: Family Medicine

## 2024-02-10 NOTE — Telephone Encounter (Unsigned)
 Copied from CRM [id number]. Topic: Medical Record Request - Attorney/Litigation >> Feb 10, 2024  1:15 PM [place] J wrote: Reason for CRM: Patient's daughter Brave Dack), calling in regarding power of attorney switch. Aram Beecham is stating that a form is needed from the provider for the patient to sign confirming the power of attorney switch. Please call daughter back at: [phone]  Route to Research officer, political party.

## 2024-02-11 NOTE — Telephone Encounter (Signed)
 Left a message for the patient's daughter to return my call.

## 2024-02-11 NOTE — Telephone Encounter (Signed)
 Pt daughter is returning mykal call

## 2024-02-12 NOTE — Telephone Encounter (Signed)
 [name] with [employer] will let daughter know mykal not in office today

## 2024-02-13 NOTE — Telephone Encounter (Signed)
 Left a message for the patient to return my call.

## 2024-02-16 NOTE — Telephone Encounter (Signed)
 I spoke with the patient's daughter and she requested a note from PCP stating the patient is mentally capable to sign power of attorney to have his daughter become hiw new power of attorney. Patient's daughter reported that is attorney is requesting this due to changes in patient's mental status.

## 2024-02-18 NOTE — Telephone Encounter (Signed)
 Patient scheduled.

## 2024-02-20 ENCOUNTER — Encounter: Payer: Self-pay | Admitting: Family Medicine

## 2024-02-20 ENCOUNTER — Ambulatory Visit (INDEPENDENT_AMBULATORY_CARE_PROVIDER_SITE_OTHER): Admitting: Family Medicine

## 2024-02-20 VITALS — BP 134/60 | HR 64 | Temp 98.1°F | Wt 138.4 lb

## 2024-02-20 DIAGNOSIS — R4189 Other symptoms and signs involving cognitive functions and awareness: Secondary | ICD-10-CM | POA: Diagnosis not present

## 2024-02-20 DIAGNOSIS — I2510 Atherosclerotic heart disease of native coronary artery without angina pectoris: Secondary | ICD-10-CM | POA: Diagnosis not present

## 2024-02-20 DIAGNOSIS — E785 Hyperlipidemia, unspecified: Secondary | ICD-10-CM

## 2024-02-20 NOTE — Progress Notes (Signed)
 Established Patient Office Visit  Subjective   Patient ID: [name], male    DOB: 16-Jun-1936  Age: 88 y.o. MRN: [id number]  Chief Complaint  Patient presents with   Medical Management of Chronic Issues    HPI   Mr Toruno is here for medical follow-up accompanied by his daughter Arline Asp.  We had received a call last week that they were needing a letter with our recommendation that patient has cognitive ability to decide on power of attorney designation for his daughters.  Patient does have history of cognitive impairment with most recent MMSE 20/30.  He does need significant assistance with regard to ADLs and management of his daily affairs but he does have enough ability cognitively to decide regarding designation of assistance for his care.  His daughter Arline Asp lives here locally and daughter Zella Ball lives in [place].  Robin's husband previously had designation as power of attorney but he passed away.  They are needing to get this revised and this is certainly in patient's best interest to get this clarified.  Family has arranged for him to get meals delivered several times per week and patient has gained a few pounds since this was initiated.  Arline Asp sees him usually weekly and is able to provide some oversight with regard to his medications.  He currently is on atorvastatin 40 mg daily for hyperlipidemia, sertraline 50 mg daily, carvedilol 6.25 mg twice daily and aspirin 1 daily.  Compliant with medications.  No reported side effects.  Last lipids were done in June.  Past Medical History:  Diagnosis Date   AAA (abdominal aortic aneurysm) (HCC)    AAA (abdominal aortic aneurysm) (HCC)    4-4 cm intrarenal aaa per US aorta 03-28-2019 epic   Benign positional vertigo    Bladder cancer (HCC)    Bladder neck contracture    Coronary artery disease CARDIOLOGIST--  DR Elease Hashimoto   ED (erectile dysfunction)    GERD (gastroesophageal reflux disease)    H/O hiatal hernia    History of anal  fissures    History of colon polyps    History of radiation therapy 08/24/12-10/22/12   prostate 6600cGy/33 sessions--  for biochemical recurrence   Hyperlipidemia    Hypertension    OA (osteoarthritis)    Peripheral vascular disease (HCC)    Recurrent prostate adenocarcinoma (HCC) first dx 09/2005--  radial prostatectomy-- gleason 4+3=7, pT2c, negative margin   04/2012--  BIOCHEMICAL  RECURRENCE to 0.33 /  SALVAGE IMRT SEPT to  NOV 2013   S/P CABG x 2    09/  2014   S/P dilatation of esophageal stricture    1992   Sciatica    both hips at times mostly left hip   Sigmoid diverticulosis    SUI (stress urinary incontinence), male    Urethral stricture    Wears glasses    Past Surgical History:  Procedure Laterality Date   CARDIAC CATHETERIZATION  08-27-2013  DR Swaz[place]   CRITICAL OSTIAL LM STENOSIS/  diffuse disease LAD 40-50%  &   pLCx 30-40%/   CARDIOVASCULAR STRESS TEST  08-18-2013  DR NASHER   SMALL/ MEDIUM AREA MILD SCAR INFEROSEPTAL WALL/ NO ISCHEMIA/  LOW RISK SCAN/  EF 62%/  LV WALL MOTION WITH SLIGHT DECREASED OF THE SEPTUM   CORONARY ANGIOPLASTY  1991   PTCA  of LEFT CFX   CORONARY ARTERY BYPASS GRAFT N/A 08/27/2013   Procedure: CORONARY ARTERY BYPASS GRAFTING (CABG) times two using left internal mammary and right  saphenous vein using endoscope.;  Surgeon: Loreli Slot, MD;  [facility];  Service: Open Heart Surgery;  Laterality: N/A;   CYSTOSCOPY WITH URETHRAL DILATATION N/A 01/26/2014   Procedure: CYSTOSCOPY WITH URETHRAL DILATATION, BLADDER BIOPSY, FOLEY CATHETER;  Surgeon: Milford Cage, MD;  Location: [facility] ORS;  Service: Urology;  Laterality: N/A;   INGUINAL HERNIA REPAIR Bilateral 1994   KNEE ARTHROSCOPY Right yrs ago   LEFT HEART CATHETERIZATION WITH CORONARY ANGIOGRAM N/A 08/27/2013   Procedure: LEFT HEART CATHETERIZATION WITH CORONARY ANGIOGRAM;  Surgeon: Peter M Swaziland, MD;  Location: [facility] CATH LAB;  Service: Cardiovascular;  Laterality: N/A;   LUMBAR  DISC SURGERY  1980'S   RADICAL RETROPUBIC PROSTATECTOMY W/ BILATERAL PELVIC NODE DISSECTION  11-18-2005   TRANSURETHRAL INCISION OF PROSTATE N/A 11/01/2020   Procedure: TRANSURETHRAL INCISION OF THE BLADDER NECK;  Surgeon: Crist Fat, MD;  Location: [facility];  Service: Urology;  Laterality: N/A;   URETHROTOMY N/A 05/30/2014   Procedure: CYSTOSCOPY  BALLOON DILATION AND DIRECT VISION INTERNAL URETHROTOMY;  Surgeon: Magdalene Molly, MD;  Location: [facility];  Service: Urology;  Laterality: N/A;    reports that he quit smoking about 29 years ago. His smoking use included cigarettes. He started smoking about 39 years ago. He has a 20 pack-year smoking history. He has never used smokeless tobacco. He reports current alcohol use. He reports that he does not use drugs. family history includes Arthritis in an other family member; Cancer in his brother and father; Hyperlipidemia in an other family member; Hypertension in an other family member. Allergies  Allergen Reactions   Bee Venom Other (See Comments)    Reaction swelling   Amoxicillin Rash   Hydrocodone Other (See Comments)    REACTION: disorientation    Review of Systems  Constitutional:  Negative for malaise/fatigue.  Eyes:  Negative for blurred vision.  Respiratory:  Negative for shortness of breath.   Cardiovascular:  Negative for chest pain.  Neurological:  Negative for dizziness, weakness and headaches.      Objective:     BP 134/60 (BP Location: Left Arm, Patient Position: Sitting, Cuff Size: Normal)   Pulse 64   Temp 98.1 F (36.7 C) (Oral)   Wt 138 lb 6.4 oz (62.8 kg)   SpO2 98%   BMI 22.00 kg/m  BP Readings from Last 3 Encounters:  02/20/24 134/60  01/07/24 (!) 144/75  12/17/23 120/60   Wt Readings from Last 3 Encounters:  02/20/24 138 lb 6.4 oz (62.8 kg)  01/07/24 130 lb 12.8 oz (59.3 kg)  12/17/23 130 lb 3.2 oz (59.1 kg)      Physical Exam Vitals reviewed.   Constitutional:      Appearance: He is well-developed.  Eyes:     Pupils: Pupils are equal, round, and reactive to light.  Neck:     Thyroid: No thyromegaly.  Cardiovascular:     Rate and Rhythm: Normal rate and regular rhythm.  Pulmonary:     Effort: Pulmonary effort is normal. No respiratory distress.     Breath sounds: Normal breath sounds. No wheezing or rales.  Musculoskeletal:     Cervical back: Neck supple.     Right lower leg: No edema.     Left lower leg: No edema.  Neurological:     Mental Status: He is alert.      No results found for any visits on 02/20/24.    The ASCVD Risk score (Arnett DK, et al., 2019)  failed to calculate for the following reasons:   The 2019 ASCVD risk score is only valid for ages 86 to 79    Assessment & Plan:   #1 cognitive impairment with likely Alzheimer's dementia.  No agitation.  Most recent MMSE 20/30.  We strongly advised oversight of his medications and Arline Asp (his daughter) will be overseeing that with pillbox.  We do highly recommend that his daughter Arline Asp and Zella Ball be given designation of power of attorney and patient is in total agreement with that.  He does have enough understanding to understand what that designation entails based on our discussion today.  It is my medical opinion that is in patient's best interest to have durable and medical power of attorney status designated to his daughters.   #2 hyperlipidemia treated with atorvastatin 40 mg daily.  Due for lipids in June.  Check lipid and CMP at follow-up  #3 history of CAD.  No recent chest pains.  Continue atorvastatin, carvedilol, and aspirin.  Evelena Peat, MD

## 2024-02-23 ENCOUNTER — Telehealth: Payer: Self-pay

## 2024-02-23 NOTE — Telephone Encounter (Signed)
 Copied from CRM [id number]. Topic: General - Other >> Feb 23, 2024 11:36 AM Gurney Maxin H wrote: Reason for CRM: Patients daughter Aram Beecham states she missed a call from [name], agent didn't see any notes please reach back out to patients daughter, thanks.  Aram Beecham [phone]

## 2024-02-23 NOTE — Telephone Encounter (Signed)
 PCP has completed letter for patient that was requested during last visit. This letter has been placed in front office for pickup by patient's daughter

## 2024-02-23 NOTE — Telephone Encounter (Signed)
 Patient would like a call back he got a call from the office there where no notes left on the account

## 2024-02-23 NOTE — Telephone Encounter (Signed)
 Please disregard spoke to patient's daughter

## 2024-04-15 ENCOUNTER — Other Ambulatory Visit: Payer: Self-pay | Admitting: Family Medicine

## 2024-04-15 DIAGNOSIS — F4321 Adjustment disorder with depressed mood: Secondary | ICD-10-CM

## 2024-05-04 ENCOUNTER — Other Ambulatory Visit: Payer: Self-pay | Admitting: Family Medicine

## 2024-05-04 DIAGNOSIS — I2510 Atherosclerotic heart disease of native coronary artery without angina pectoris: Secondary | ICD-10-CM

## 2024-05-24 ENCOUNTER — Ambulatory Visit: Payer: Self-pay | Admitting: *Deleted

## 2024-05-24 NOTE — Telephone Encounter (Signed)
 FYI Only or Action Required?: Action required by provider: request for appointment.  Patient was last seen in primary care on 02/20/2024 by Micheal Wolm ORN, MD. Called Nurse Triage reporting Bleeding/Bruising. Symptoms began a week ago. Interventions attempted: Nothing. Symptoms are: unchanged.  Triage Disposition: See PCP When Office is Open (Within 3 Days)  Patient/caregiver understands and will follow disposition?: Yes               Copied from CRM [id number]. Topic: Clinical - Red Word Triage >> May 24, 2024  3:55 PM Suzen RAMAN wrote: Red Word that prompted transfer to Nurse Triage: Pain, swelling, and severe bruising on both arms Reason for Disposition  [1] Not caused by an injury AND [2] < 5 unexplained bruises  Answer Assessment - Initial Assessment Questions 1. APPEARANCE of BRUISE: Describe the bruise.      Couple of areas blood noted and bruise 2. SIZE: How large is the bruise?      More than 2 inches 3. NUMBER: How many bruises are there?      3-4  4. LOCATION: Where is the bruise located?      Upper area of arms  5. ONSET: How long ago did the bruise occur?      Not quite a week  6. CAUSE: Tell me how it happened.     Working outside on fence and noted bruising popped up  7. MEDICAL HISTORY: Do you have any medical problems that can cause easy bruising or bleeding? (e.g., leukemia, liver disease, recent chemotherapy)     na 8. MEDICINES: Do you take any medications which thin the blood such as: aspirin , heparin , ibuprofen (NSAIDS), Plavix, or Coumadin?     na 9. OTHER SYMPTOMS: Do you have any other symptoms?  (e.g., weakness, dizziness, pain, fever, nosebleed, blood in urine/stool)     No other sx  10. PREGNANCY: Is there any chance you are pregnant? When was your last menstrual period?       Na   Appt scheduled tomorrow with other provider . Patient unable to come in Wednesday with PCP due to another appt. Patient concerned why he  is bruising so much and denies injuring self.  Protocols used: [employer]

## 2024-05-25 ENCOUNTER — Encounter: Payer: Self-pay | Admitting: Family Medicine

## 2024-05-25 ENCOUNTER — Ambulatory Visit (INDEPENDENT_AMBULATORY_CARE_PROVIDER_SITE_OTHER): Admitting: Family Medicine

## 2024-05-25 VITALS — BP 118/62 | HR 52 | Temp 98.0°F | Wt 131.6 lb

## 2024-05-25 DIAGNOSIS — D696 Thrombocytopenia, unspecified: Secondary | ICD-10-CM | POA: Insufficient documentation

## 2024-05-25 DIAGNOSIS — R233 Spontaneous ecchymoses: Secondary | ICD-10-CM | POA: Diagnosis not present

## 2024-05-25 LAB — CBC WITH DIFFERENTIAL/PLATELET
Basophils Absolute: 0 10*3/uL (ref 0.0–0.1)
Basophils Relative: 0.9 % (ref 0.0–3.0)
Eosinophils Absolute: 0.3 10*3/uL (ref 0.0–0.7)
Eosinophils Relative: 6.1 % — ABNORMAL HIGH (ref 0.0–5.0)
HCT: 37.9 % — ABNORMAL LOW (ref 39.0–52.0)
Hemoglobin: 12.5 g/dL — ABNORMAL LOW (ref 13.0–17.0)
Lymphocytes Relative: 19.1 % (ref 12.0–46.0)
Lymphs Abs: 1.1 10*3/uL (ref 0.7–4.0)
MCHC: 33 g/dL (ref 30.0–36.0)
MCV: 108.2 fl — ABNORMAL HIGH (ref 78.0–100.0)
Monocytes Absolute: 0.8 10*3/uL (ref 0.1–1.0)
Monocytes Relative: 14.3 % — ABNORMAL HIGH (ref 3.0–12.0)
Neutro Abs: 3.3 10*3/uL (ref 1.4–7.7)
Neutrophils Relative %: 59.6 % (ref 43.0–77.0)
Platelets: 114 10*3/uL — ABNORMAL LOW (ref 150.0–400.0)
RBC: 3.5 Mil/uL — ABNORMAL LOW (ref 4.22–5.81)
RDW: 13.5 % (ref 11.5–15.5)
WBC: 5.5 10*3/uL (ref 4.0–10.5)

## 2024-05-25 LAB — APTT: aPTT: 28.2 s (ref 25.4–36.8)

## 2024-05-25 LAB — BASIC METABOLIC PANEL WITH GFR
BUN: 38 mg/dL — ABNORMAL HIGH (ref 6–23)
CO2: 33 meq/L — ABNORMAL HIGH (ref 19–32)
Calcium: 9.9 mg/dL (ref 8.4–10.5)
Chloride: 104 meq/L (ref 96–112)
Creatinine, Ser: 1.72 mg/dL — ABNORMAL HIGH (ref 0.40–1.50)
GFR: 35.6 mL/min — ABNORMAL LOW (ref >60.00)
Glucose, Bld: 79 mg/dL (ref 70–99)
Potassium: 5.7 meq/L — ABNORMAL HIGH (ref 3.5–5.1)
Sodium: 140 meq/L (ref 135–145)

## 2024-05-25 LAB — HEPATIC FUNCTION PANEL
ALT: 23 U/L (ref 0–53)
AST: 21 U/L (ref 0–37)
Albumin: 4.3 g/dL (ref 3.5–5.2)
Alkaline Phosphatase: 61 U/L (ref 39–117)
Bilirubin, Direct: 0.1 mg/dL (ref 0.0–0.3)
Total Bilirubin: 0.6 mg/dL (ref 0.2–1.2)
Total Protein: 7 g/dL (ref 6.0–8.3)

## 2024-05-25 LAB — PROTIME-INR
INR: 1.1 ratio — ABNORMAL HIGH (ref 0.8–1.0)
Prothrombin Time: 12 s (ref 9.6–13.1)

## 2024-05-25 LAB — TSH: TSH: 2.33 u[IU]/mL (ref 0.35–5.50)

## 2024-05-25 NOTE — Progress Notes (Signed)
   Subjective:    Patient ID: [name], male    DOB: Feb 04, 1936, 88 y.o.   MRN: [id number]  HPI Here for extensive bruising on both forearms for the past 3 days.  Normally he does not bruise or bleed easily. He does take aspirin  daily. He has had low platelet counts the past 3 years, and in August 2024 this was 106. He says for the past 3 days he has been building a fence, and in so doing he has brushed his arms against the fence materials repeatedly. His arms are mildly sore but not painful.    Review of Systems  Constitutional: Negative.   Respiratory: Negative.    Cardiovascular: Negative.   Hematological:  Bruises/bleeds easily.       Objective:   Physical Exam Constitutional:      General: He is not in acute distress.    Appearance: Normal appearance.   Cardiovascular:     Rate and Rhythm: Normal rate and regular rhythm.     Pulses: Normal pulses.     Heart sounds: Normal heart sounds.  Pulmonary:     Effort: Pulmonary effort is normal.     Breath sounds: Normal breath sounds.   Skin:    Comments: The extensor surfaces of both forearms have extensive ecchymoses with a few scabbed over abrasions.    Neurological:     Mental Status: He is alert.           Assessment & Plan:  Extensive brusing, likely due to recent trauma on top of aspirin  use and thrombocytopenia. We will check labs today including a CBC.  Garnette Olmsted, MD

## 2024-05-26 ENCOUNTER — Ambulatory Visit: Payer: Self-pay | Admitting: Family Medicine

## 2024-05-26 ENCOUNTER — Telehealth: Payer: Self-pay

## 2024-05-26 DIAGNOSIS — H401131 Primary open-angle glaucoma, bilateral, mild stage: Secondary | ICD-10-CM | POA: Diagnosis not present

## 2024-05-26 DIAGNOSIS — Z961 Presence of intraocular lens: Secondary | ICD-10-CM | POA: Diagnosis not present

## 2024-05-26 NOTE — Telephone Encounter (Signed)
 Copied from CRM [id number]. Topic: Clinical - Lab/Test Results >> May 26, 2024 11:32 AM Larissa RAMAN wrote: Reason for CRM: Relayed lab results per providers note, verbatim. Patient verbalized understanding, no additional questions at this time.

## 2024-06-21 ENCOUNTER — Other Ambulatory Visit: Payer: Self-pay

## 2024-07-14 ENCOUNTER — Ambulatory Visit: Payer: Medicare HMO | Admitting: Physician Assistant

## 2024-07-14 ENCOUNTER — Ambulatory Visit (HOSPITAL_COMMUNITY)
Admission: RE | Admit: 2024-07-14 | Discharge: 2024-07-14 | Disposition: A | Discharge status: Home / Self Care | Payer: Medicare HMO | Source: Ambulatory Visit | Attending: Vascular Surgery | Admitting: Vascular Surgery

## 2024-07-14 VITALS — BP 127/68 | HR 53 | Temp 97.9°F | Wt 130.1 lb

## 2024-07-14 DIAGNOSIS — I7143 Infrarenal abdominal aortic aneurysm, without rupture: Secondary | ICD-10-CM | POA: Diagnosis not present

## 2024-07-14 NOTE — Progress Notes (Signed)
 Office Note     CC:  follow up Requesting Provider:  Micheal Wolm ORN, MD  HPI: [name] is a 88 y.o. (21-May-1936) male who presents for surveillance of abdominal aortic aneurysm.  6 months ago AAA measured 4.9 cm by duplex.  He denies any new or changing abdominal or back pain.  He is also ambulatory without any claudication or tissue changes of bilateral lower extremities.  He is on aspirin  and statin daily.  He denies tobacco use.   Past Medical History:  Diagnosis Date   AAA (abdominal aortic aneurysm) (HCC)    AAA (abdominal aortic aneurysm) (HCC)    4-4 cm intrarenal aaa per us  aorta 03-28-2019 epic   Benign positional vertigo    Bladder cancer (HCC)    Bladder neck contracture    Coronary artery disease CARDIOLOGIST--  DR ALVETA   ED (erectile dysfunction)    GERD (gastroesophageal reflux disease)    H/O hiatal hernia    History of anal fissures    History of colon polyps    History of radiation therapy 08/24/12-10/22/12   prostate 6600cGy/33 sessions--  for biochemical recurrence   Hyperlipidemia    Hypertension    OA (osteoarthritis)    Peripheral vascular disease (HCC)    Recurrent prostate adenocarcinoma (HCC) first dx 09/2005--  radial prostatectomy-- gleason 4+3=7, pT2c, negative margin   04/2012--  BIOCHEMICAL  RECURRENCE to 0.33 /  SALVAGE IMRT SEPT to  NOV 2013   S/P CABG x 2    09/  2014   S/P dilatation of esophageal stricture    1992   Sciatica    both hips at times mostly left hip   Sigmoid diverticulosis    SUI (stress urinary incontinence), male    Urethral stricture    Wears glasses     Past Surgical History:  Procedure Laterality Date   CARDIAC CATHETERIZATION  08-27-2013  DR SWAZILAND   CRITICAL OSTIAL LM STENOSIS/  diffuse disease LAD 40-50%  &   pLCx 30-40%/   CARDIOVASCULAR STRESS TEST  08-18-2013  DR NASHER   SMALL/ MEDIUM AREA MILD SCAR INFEROSEPTAL WALL/ NO ISCHEMIA/  LOW RISK SCAN/  EF 62%/  LV WALL MOTION WITH SLIGHT DECREASED OF  THE SEPTUM   CORONARY ANGIOPLASTY  1991   PTCA  of LEFT CFX   CORONARY ARTERY BYPASS GRAFT N/A 08/27/2013   Procedure: CORONARY ARTERY BYPASS GRAFTING (CABG) times two using left internal mammary and right saphenous vein using endoscope.;  Surgeon: Elspeth JAYSON Millers, MD;  Location: [facility] OR;  Service: Open Heart Surgery;  Laterality: N/A;   CYSTOSCOPY WITH URETHRAL DILATATION N/A 01/26/2014   Procedure: CYSTOSCOPY WITH URETHRAL DILATATION, BLADDER BIOPSY, FOLEY CATHETER;  Surgeon: Toribio Neysa Repine, MD;  Location: [facility] ORS;  Service: Urology;  Laterality: N/A;   INGUINAL HERNIA REPAIR Bilateral 1994   KNEE ARTHROSCOPY Right yrs ago   LEFT HEART CATHETERIZATION WITH CORONARY ANGIOGRAM N/A 08/27/2013   Procedure: LEFT HEART CATHETERIZATION WITH CORONARY ANGIOGRAM;  Surgeon: Peter M Swaziland, MD;  Location: [facility] CATH LAB;  Service: Cardiovascular;  Laterality: N/A;   LUMBAR DISC SURGERY  1980'S   RADICAL RETROPUBIC PROSTATECTOMY W/ BILATERAL PELVIC NODE DISSECTION  11-18-2005   TRANSURETHRAL INCISION OF PROSTATE N/A 11/01/2020   Procedure: TRANSURETHRAL INCISION OF THE BLADDER NECK;  Surgeon: Cam Morene ORN, MD;  Location: [facility];  Service: Urology;  Laterality: N/A;   URETHROTOMY N/A 05/30/2014   Procedure: CYSTOSCOPY  BALLOON DILATION AND DIRECT VISION INTERNAL URETHROTOMY;  Surgeon: Toribio CINDERELLA Repine, MD;  Location: [facility];  Service: Urology;  Laterality: N/A;    Social History   Socioeconomic History   Marital status: Single    Spouse name: Not on file   Number of children: 2   Years of education: 13   Highest education level: Not on file  Occupational History   Occupation: Retired    Associate Professor: RETIRED  Tobacco Use   Smoking status: Former    Current packs/day: 0.00    Average packs/day: 2.0 packs/day for 10.0 years (20.0 ttl pk-yrs)    Types: Cigarettes    Start date: 12/03/1984    Quit date: 12/03/1994    Years since quitting: 29.6    Smokeless tobacco: Never  Vaping Use   Vaping status: Never Used  Substance and Sexual Activity   Alcohol use: Yes    Comment: occasionally   Drug use: No   Sexual activity: Not on file  Other Topics Concern   Not on file  Social History Narrative   Lives alone   Caffeine use: Coffee- 3 cups daily   Soda sometimes.    Social Drivers of Corporate investment banker Strain: Low Risk  (01/22/2023)   Overall Financial Resource Strain (CARDIA)    Difficulty of Paying Living Expenses: Not very hard  Food Insecurity: No Food Insecurity (01/22/2023)   Hunger Vital Sign    Worried About Running Out of Food in the Last Year: Never true    Ran Out of Food in the Last Year: Never true  Transportation Needs: No Transportation Needs (01/22/2023)   PRAPARE - Administrator, Civil Service (Medical): No    Lack of Transportation (Non-Medical): No  Physical Activity: Inactive (06/18/2023)   Exercise Vital Sign    Days of Exercise per Week: 0 days    Minutes of Exercise per Session: 0 min  Stress: No Stress Concern Present (06/18/2023)   [employer] of Occupational Health - Occupational Stress Questionnaire    Feeling of Stress : Not at all  Social Connections: Unknown (06/18/2023)   Social Connection and Isolation Panel    Frequency of Communication with Friends and Family: Three times a week    Frequency of Social Gatherings with Friends and Family: Three times a week    Attends Religious Services: Never    Active Member of Clubs or Organizations: No    Attends Banker Meetings: Never    Marital Status: Patient unable to answer  Intimate Partner Violence: Patient Unable To Answer (06/18/2023)   Humiliation, Afraid, Rape, and Kick questionnaire    Fear of Current or Ex-Partner: Patient unable to answer    Emotionally Abused: Patient unable to answer    Physically Abused: Patient unable to answer    Sexually Abused: Patient unable to answer    Family History   Problem Relation Age of Onset   Cancer Father        prostate died 62 something   Cancer Brother        prostate cancer   Arthritis Other    Hyperlipidemia Other    Hypertension Other     Current Outpatient Medications  Medication Sig Dispense Refill   acetaminophen  (TYLENOL ) 500 MG tablet Take 500 mg by mouth every 6 (six) hours as needed.     aspirin  81 MG EC tablet Take 1 tablet (81 mg total) by mouth daily.     atorvastatin  (LIPITOR) 40 MG tablet TAKE 1 TABLET BY  MOUTH EVERY DAY 90 tablet 1   carvedilol  (COREG ) 6.25 MG tablet Take 1 tablet (6.25 mg total) by mouth 2 (two) times daily. 180 tablet 3   Cyanocobalamin  (VITAMIN B12) 1000 MCG TBCR Take 100 mcg by mouth daily.     latanoprost (XALATAN) 0.005 % ophthalmic solution Place 1 drop into both eyes at bedtime.     Multiple Vitamin (MULTIVITAMIN WITH MINERALS) TABS tablet Take 1 tablet by mouth daily.     nitroGLYCERIN  (NITROSTAT ) 0.4 MG SL tablet PLACE 1 TABLET UNDER THE TONUE EVERY 5 MINUTES AS NEEDED FOR CHEST PAIN. 75 tablet 3   polyethylene glycol (MIRALAX  / GLYCOLAX ) packet Take 17 g by mouth daily as needed (for constipation).     sertraline  (ZOLOFT ) 50 MG tablet TAKE 1 TABLET BY MOUTH EVERY DAY 90 tablet 1   No current facility-administered medications for this visit.    Allergies  Allergen Reactions   Bee Venom Other (See Comments)    Reaction swelling   Amoxicillin  Rash   Hydrocodone Other (See Comments)    REACTION: disorientation     REVIEW OF SYSTEMS:  Negative unless noted in HPI [X]  denotes positive finding, [ ]  denotes negative finding Cardiac  Comments:  Chest pain or chest pressure:    Shortness of breath upon exertion:    Short of breath when lying flat:    Irregular heart rhythm:        Vascular    Pain in calf, thigh, or hip brought on by ambulation:    Pain in feet at night that wakes you up from your sleep:     Blood clot in your veins:    Leg swelling:         Pulmonary    Oxygen at  home:    Productive cough:     Wheezing:         Neurologic    Sudden weakness in arms or legs:     Sudden numbness in arms or legs:     Sudden onset of difficulty speaking or slurred speech:    Temporary loss of vision in one eye:     Problems with dizziness:         Gastrointestinal    Blood in stool:     Vomited blood:         Genitourinary    Burning when urinating:     Blood in urine:        Psychiatric    Major depression:         Hematologic    Bleeding problems:    Problems with blood clotting too easily:        Skin    Rashes or ulcers:        Constitutional    Fever or chills:      PHYSICAL EXAMINATION:  Vitals:   07/14/24 0851  BP: 127/68  Pulse: (!) 53  Temp: 97.9 F (36.6 C)  TempSrc: Temporal  Weight: 130 lb 1.6 oz (59 kg)    General:  WDWN in NAD; vital signs documented above Gait: Not observed HENT: WNL, normocephalic Pulmonary: normal non-labored breathing Cardiac: regular HR Abdomen: soft, NT, no masses Skin: without rashes Vascular Exam/Pulses: Palpable PTA pulses bilaterally Extremities: without ischemic changes, without Gangrene , without cellulitis; without open wounds;  Musculoskeletal: no muscle wasting or atrophy  Neurologic: A&O X 3 Psychiatric:  The pt has Normal affect.   Non-Invasive Vascular Imaging:   4.8cm AAA at largest diameter  ASSESSMENT/PLAN:: 88 y.o. male here for follow up for surveillance of AAA  AAA is stable by duplex.  The largest diameter of AAA noted today is 4.8 cm.  He will be considered for repair as he approaches 5.5 cm.  No indication for repair at this time.  He will continue his aspirin  and statin daily.  We will repeat AAA duplex in 6 months.   Donnice Sender, PA-C [facility] [phone]  Clinic MD:   Sheree

## 2024-07-16 ENCOUNTER — Other Ambulatory Visit: Payer: Self-pay

## 2024-07-16 DIAGNOSIS — I7143 Infrarenal abdominal aortic aneurysm, without rupture: Secondary | ICD-10-CM

## 2024-07-20 ENCOUNTER — Ambulatory Visit: Attending: Cardiology | Admitting: Cardiology

## 2024-07-20 ENCOUNTER — Encounter: Payer: Self-pay | Admitting: Cardiology

## 2024-07-20 VITALS — BP 131/62 | HR 52 | Resp 16 | Ht 66.0 in | Wt 128.4 lb

## 2024-07-20 DIAGNOSIS — I10 Essential (primary) hypertension: Secondary | ICD-10-CM

## 2024-07-20 DIAGNOSIS — E7800 Pure hypercholesterolemia, unspecified: Secondary | ICD-10-CM

## 2024-07-20 DIAGNOSIS — I2510 Atherosclerotic heart disease of native coronary artery without angina pectoris: Secondary | ICD-10-CM | POA: Diagnosis not present

## 2024-07-20 DIAGNOSIS — I7143 Infrarenal abdominal aortic aneurysm, without rupture: Secondary | ICD-10-CM | POA: Diagnosis not present

## 2024-07-20 NOTE — Progress Notes (Signed)
 Cardiology Office Note:  .   Date:  07/20/2024  ID:  [name], DOB 1936/01/18, MRN [id number] PCP: Micheal Wolm ORN, MD   [facility] Providers Cardiologist:  Aleene Passe, MD (Inactive)   History of Present Illness: .   [name] is a 88 y.o. Caucasian male patient with history of CAD SP CABG in 08/27/2013 with LIMA to LAD and SVG to dominant CX-OM 1 for critical LM disease, hypertension, hypercholesterolemia moderate size abdominal aortic aneurysm measuring 4.8 cm by aortic duplex on 07/14/2024 and has remained stable since 08/05/2016 presents for establishing care, previously being followed by Dr. Aleene Passe.  He remains asymptomatic.   Cardiac Studies relevent.    Abdominal Aortic Duplex 07/16/2024:  Abdominal Aorta: There is evidence of abnormal dilatation of the distal Abdominal aorta.  The largest aortic measurement is 4.8 cm. The largest aortic diameter remains essentially unchanged compared to prior exam.  Previous diameter measurement was 4.9 cm obtained on 01/07/2024.     Discussed the use of AI scribe software for clinical note transcription with the patient, who gave verbal consent to proceed.  History of Present Illness [name] is an 88 year old male with coronary artery disease and abdominal aortic aneurysm who presents for cardiovascular follow-up.  He underwent balloon angioplasty and heart catheterization in 2014, revealing a major blockage in the left main artery, followed by bypass surgery with two grafts. He is on carvedilol , baby aspirin , and Lipitor, and has had no recent cardiac issues.  The abdominal aortic aneurysm measures 4.8 cm and has been stable since 2017, monitored with annual scans.  High cholesterol is well controlled with medication, with an LDL of 43.   Labs   Lab Results  Component Value Date   CHOL 99 05/19/2023   HDL 42.80 05/19/2023   LDLCALC 43 05/19/2023   TRIG 67.0 05/19/2023   CHOLHDL 2 05/19/2023    No results found for: LIPOA  Recent Labs    05/25/24 1103  NA 140  K 5.7 No hemolysis seen*  CL 104  CO2 33*  GLUCOSE 79  BUN 38*  CREATININE 1.72*  CALCIUM  9.9    Lab Results  Component Value Date   ALT 23 05/25/2024   AST 21 05/25/2024   ALKPHOS 61 05/25/2024   BILITOT 0.6 05/25/2024      Latest Ref Rng & Units 05/25/2024   11:03 AM 07/04/2023    3:43 PM 04/30/2023    1:49 PM  CBC  WBC 4.0 - 10.5 K/uL 5.5  5.4  7.2   Hemoglobin 13.0 - 17.0 g/dL 87.4  89.0  88.4   Hematocrit 39.0 - 52.0 % 37.9  34.8  34.4   Platelets 150.0 - 400.0 K/uL 114.0  106  123.0    No results found for: HGBA1C  Lab Results  Component Value Date   TSH 2.33 05/25/2024     ROS  Review of Systems  Cardiovascular:  Negative for chest pain, dyspnea on exertion and leg swelling.  Psychiatric/Behavioral:  Positive for memory loss.    Physical Exam:   VS:  BP 131/62 (BP Location: Left Arm, Patient Position: Sitting, Cuff Size: Normal)   Pulse (!) 52   Resp 16   Ht 5' 6 (1.676 m)   Wt 128 lb 6.4 oz (58.2 kg)   SpO2 99%   BMI 20.72 kg/m    Wt Readings from Last 3 Encounters:  07/20/24 128 lb 6.4 oz (58.2 kg)  07/14/24 130 lb  1.6 oz (59 kg)  05/25/24 131 lb 9.6 oz (59.7 kg)    BP Readings from Last 3 Encounters:  07/20/24 131/62  07/14/24 127/68  05/25/24 118/62   Physical Exam Neck:     Vascular: No carotid bruit or JVD.  Cardiovascular:     Rate and Rhythm: Normal rate and regular rhythm.     Pulses: Intact distal pulses.     Heart sounds: Normal heart sounds. No murmur heard.    No gallop.  Pulmonary:     Effort: Pulmonary effort is normal.     Breath sounds: Normal breath sounds.  Abdominal:     General: Bowel sounds are normal. There is no abdominal bruit.     Palpations: Abdomen is soft. There is pulsatile mass.     Tenderness: There is no abdominal tenderness.  Musculoskeletal:     Right lower leg: No edema.     Left lower leg: No edema.    EKG:    EKG  Interpretation Date/Time:  Tuesday July 20 2024 09:26:50 EDT Ventricular Rate:  52 PR Interval:  200 QRS Duration:  88 QT Interval:  430 QTC Calculation: 399 R Axis:   4  Text Interpretation: EKG 07/20/2024: Normal sinus rhythm at rate of 52 bpm, normal EKG.  Compared to 07/11/2023, ectopic atrial rhythm has been replaced. Confirmed by Julias Mould, Jagadeesh ([id number]) on 07/20/2024 9:42:58 AM    ASSESSMENT AND PLAN: .      ICD-10-CM   1. Coronary artery disease involving native coronary artery of native heart without angina pectoris  I25.10 EKG 12-Lead    Ambulatory referral to Vascular Surgery    2. Infrarenal abdominal aortic aneurysm (AAA) without rupture (HCC)  I71.43     3. Pure hypercholesterolemia  E78.00     4. Essential hypertension  I10      Assessment & Plan Coronary artery disease, post-CABG Coronary artery disease status post coronary artery bypass grafting (CABG) in 2014 with two grafts: LIMA to LAD and saphenous vein graft to the circumflex artery. He is doing well post-surgery with no reported cardiac issues. EKG and physical examination are normal. Medications include carvedilol  6.25 mg twice daily, aspirin  81 mg daily, and atorvastatin  40 mg daily, which have maintained good cardiac health and controlled blood pressure. - Continue carvedilol  6.25 mg twice daily - Continue aspirin  81 mg daily - Continue atorvastatin  40 mg daily - Refer to vascular surgery for annual evaluation and monitoring  Abdominal aortic aneurysm, stable Abdominal aortic aneurysm measuring 4.8 cm, stable since 2017. It is moderate in size and has not shown significant change over the years, indicating stability. - Refer to vascular surgery for annual evaluation and monitoring, previously was being seen by Dr. Medford Fireman.  Has remained stable from cardiac standpoint and we will not be seeing him from cardiac standpoint, it would be appropriate for vascular surgery to keep an eye on the  AAA.  Hyperlipidemia Hyperlipidemia is well controlled with atorvastatin . LDL cholesterol is at 43, which is considered optimal. - Continue atorvastatin  40 mg daily  Memory impairment Memory impairment noted, with no prior discussion with primary care provider. He has not reported these issues to his primary care physician. - Inform primary care physician, Dr. Micheal, about memory issues for further evaluation and management  Constipation Reports constipation and irregular bowel movements. Currently using Miralax  but not on a daily basis. - Advise daily use of Miralax  to regulate bowel movements  I called his daughter, left a message regarding his  cardiac status, memory issues and to follow-up with PCP.  Patient is full code.   Follow up: PRN  Signed,  Gordy Bergamo, MD, [facility] 07/20/2024, 10:01 AM [facility] [address] [place], [place] [address] Phone: [phone]. Fax:  [phone]

## 2024-07-20 NOTE — Patient Instructions (Signed)
 Medication Instructions:  Your physician recommends that you continue on your current medications as directed. Please refer to the Current Medication list given to you today.  *If you need a refill on your cardiac medications before your next appointment, please call your pharmacy*  Lab Work: None ordered.  You may go to any [facility] Location for your lab work:  [facility] - [address] ([facility]) - [address] [phone] [address]  [place] - [address]   [place]  - [address]   [place] - [address] - [address] [employer]  - [address] [place] - [address] ([facility]   If you have labs (blood work) drawn today and your tests are completely normal, you will receive your results only by: [employer] (if you have MyChart)  If you have any lab test that is abnormal or we need to change your treatment, we will call you or send a MyChart message to review the results.  Testing/Procedures: None ordered.  Follow-Up: At [facility], you and your health needs are our priority.  As part of our continuing mission to provide you with exceptional heart care, we have created designated Provider Care Teams.  These Care Teams include your primary Cardiologist (physician) and Advanced Practice Providers (APPs -  Physician Assistants and Nurse Practitioners) who all work together to provide you with the care you need, when you need it.  We recommend signing up for the patient portal called MyChart.  Sign up information is provided on this After Visit Summary.  MyChart is used to connect with patients for Virtual Visits (Telemedicine).  Patients are able to view lab/test results, encounter notes, upcoming appointments, etc.  Non-urgent messages can be sent to your provider as well.   To learn more about what you can do with MyChart, go to  [email or web address].    Your next appointment:   Referral to Vascular surgery  They will call to set up that appointment

## 2024-08-13 DIAGNOSIS — N320 Bladder-neck obstruction: Secondary | ICD-10-CM | POA: Diagnosis not present

## 2024-08-13 DIAGNOSIS — Z8546 Personal history of malignant neoplasm of prostate: Secondary | ICD-10-CM | POA: Diagnosis not present

## 2024-08-18 ENCOUNTER — Telehealth: Payer: Self-pay

## 2024-08-18 NOTE — Telephone Encounter (Signed)
 No attempt has been made to contact patient

## 2024-08-18 NOTE — Telephone Encounter (Signed)
 Copied from CRM [id number]. Topic: General - Call Back - No Documentation >> Aug 18, 2024  2:23 PM Larissa S wrote: Reason for CRM: Patient returning missed call, unable to locate any documentation. Request a callback if needed.

## 2024-08-20 DIAGNOSIS — Z8551 Personal history of malignant neoplasm of bladder: Secondary | ICD-10-CM | POA: Diagnosis not present

## 2024-08-20 DIAGNOSIS — N393 Stress incontinence (female) (male): Secondary | ICD-10-CM | POA: Diagnosis not present

## 2024-08-20 DIAGNOSIS — Z8546 Personal history of malignant neoplasm of prostate: Secondary | ICD-10-CM | POA: Diagnosis not present

## 2024-09-16 ENCOUNTER — Ambulatory Visit (INDEPENDENT_AMBULATORY_CARE_PROVIDER_SITE_OTHER)

## 2024-09-16 VITALS — BP 131/72 | Ht 66.0 in | Wt 126.0 lb

## 2024-09-16 DIAGNOSIS — Z0000 Encounter for general adult medical examination without abnormal findings: Secondary | ICD-10-CM

## 2024-09-16 NOTE — Patient Instructions (Signed)
 [name],  Thank you for taking the time for your Medicare Wellness Visit. I appreciate your continued commitment to your health goals. Please review the care plan we discussed, and feel free to reach out if I can assist you further.  Medicare recommends these wellness visits once per year to help you and your care team stay ahead of potential health issues. These visits are designed to focus on prevention, allowing your provider to concentrate on managing your acute and chronic conditions during your regular appointments.  Please note that Annual Wellness Visits do not include a physical exam. Some assessments may be limited, especially if the visit was conducted virtually. If needed, we may recommend a separate in-person follow-up with your provider.  Ongoing Care Seeing your primary care provider every 3 to 6 months helps us  monitor your health and provide consistent, personalized care.   Referrals If a referral was made during today's visit and you haven't received any updates within two weeks, please contact the referred provider directly to check on the status.  Recommended Screenings:  Health Maintenance  Topic Date Due   Flu Shot  07/02/2024   COVID-19 Vaccine (6 - 2025-26 season) 08/02/2024   Medicare Annual Wellness Visit  09/16/2025   DTaP/Tdap/Td vaccine (3 - Tdap) 05/20/2029   Pneumococcal Vaccine for age over 63  Completed   Zoster (Shingles) Vaccine  Completed   Meningitis B Vaccine  Aged Out       09/16/2024    4:08 PM  Advanced Directives  Does Patient Have a Medical Advance Directive? Yes  Type of Estate agent of [place];Living will  Does patient want to make changes to medical advance directive? No - Patient declined  Copy of Healthcare Power of Attorney in Chart? Yes - validated most recent copy scanned in chart (See row information)   Advance Care Planning is important because it: Ensures you receive medical care that aligns with  your values, goals, and preferences. Provides guidance to your family and loved ones, reducing the emotional burden of decision-making during critical moments.  Vision: Annual vision screenings are recommended for early detection of glaucoma, cataracts, and diabetic retinopathy. These exams can also reveal signs of chronic conditions such as diabetes and high blood pressure.  Dental: Annual dental screenings help detect early signs of oral cancer, gum disease, and other conditions linked to overall health, including heart disease and diabetes.  Please see the attached documents for additional preventive care recommendations.

## 2024-09-16 NOTE — Progress Notes (Signed)
 Because this visit was a virtual/telehealth visit,  certain criteria was not obtained, such a blood pressure, CBG if applicable, and timed get up and go. Any medications not marked as taking were not mentioned during the medication reconciliation part of the visit. Any vitals not documented were not able to be obtained due to this being a telehealth visit or patient was unable to self-report a recent blood pressure reading due to a lack of equipment at home via telehealth. Vitals that have been documented are verbally provided by the patient.  This visit was performed by a medical professional under my direct supervision. I was immediately available for consultation/collaboration. I have reviewed and agree with the Annual Wellness Visit documentation.  Subjective:   [name] is a 88 y.o. who presents for a Medicare Wellness preventive visit.  As a reminder, Annual Wellness Visits don't include a physical exam, and some assessments may be limited, especially if this visit is performed virtually. We may recommend an in-person follow-up visit with your provider if needed.  Visit Complete: Virtual I connected with  [name] on 09/16/24 by a audio enabled telemedicine application and verified that I am speaking with the correct person using two identifiers.  Patient Location: Home  Provider Location: Home Office  I discussed the limitations of evaluation and management by telemedicine. The patient expressed understanding and agreed to proceed.  Vital Signs: Because this visit was a virtual/telehealth visit, some criteria may be missing or patient reported. Any vitals not documented were not able to be obtained and vitals that have been documented are patient reported.  VideoDeclined- This patient declined Librarian, academic. Therefore the visit was completed with audio only.  Persons Participating in Visit: Patient.  AWV Questionnaire: No: Patient  Medicare AWV questionnaire was not completed prior to this visit.  Cardiac Risk Factors include: advanced age (>108men, >48 women);hypertension;male gender     Objective:    Today's Vitals   09/16/24 1604  BP: 131/72  Weight: 126 lb (57.2 kg)  Height: 5' 6 (1.676 m)   Body mass index is 20.34 kg/m.     09/16/2024    4:08 PM 07/04/2023   12:37 PM 05/14/2022   11:47 AM 05/08/2021   11:21 AM 03/15/2021   11:51 AM 09/27/2020    1:04 PM 06/24/2018   11:28 AM  Advanced Directives  Does Patient Have a Medical Advance Directive? Yes Yes Yes Yes No Yes Yes   Type of Estate agent of [place];Living will Healthcare Power of [place];Living will Healthcare Power of [place];Living will Healthcare Power of [employer] of [place];Living will   Does patient want to make changes to medical advance directive? No - Patient declined     No - Patient declined   Copy of Healthcare Power of Attorney in Chart? Yes - validated most recent copy scanned in chart (See row information)  No - copy requested No - copy requested        Data saved with a previous flowsheet row definition    Current Medications (verified) Outpatient Encounter Medications as of 09/16/2024  Medication Sig   acetaminophen  (TYLENOL ) 500 MG tablet Take 500 mg by mouth every 6 (six) hours as needed.   aspirin  81 MG EC tablet Take 1 tablet (81 mg total) by mouth daily.   atorvastatin  (LIPITOR) 40 MG tablet TAKE 1 TABLET BY MOUTH EVERY DAY   carvedilol  (COREG ) 6.25 MG tablet Take 1 tablet (6.25 mg total) by  mouth 2 (two) times daily.   Cyanocobalamin  (VITAMIN B12) 1000 MCG TBCR Take 100 mcg by mouth daily.   Multiple Vitamin (MULTIVITAMIN WITH MINERALS) TABS tablet Take 1 tablet by mouth daily.   nitroGLYCERIN  (NITROSTAT ) 0.4 MG SL tablet PLACE 1 TABLET UNDER THE TONUE EVERY 5 MINUTES AS NEEDED FOR CHEST PAIN.   polyethylene glycol (MIRALAX  / GLYCOLAX ) packet Take 17 g by mouth daily as needed (for  constipation).   sertraline  (ZOLOFT ) 50 MG tablet TAKE 1 TABLET BY MOUTH EVERY DAY   No facility-administered encounter medications on file as of 09/16/2024.    Allergies (verified) Bee venom, Amoxicillin , and Hydrocodone   History: Past Medical History:  Diagnosis Date   AAA (abdominal aortic aneurysm)    AAA (abdominal aortic aneurysm)    4-4 cm intrarenal aaa per us  aorta 03-28-2019 epic   Benign positional vertigo    Bladder cancer [facility])    Bladder neck contracture    Coronary artery disease CARDIOLOGIST--  DR ALVETA   ED (erectile dysfunction)    GERD (gastroesophageal reflux disease)    H/O hiatal hernia    History of anal fissures    History of colon polyps    History of radiation therapy 08/24/12-10/22/12   prostate 6600cGy/33 sessions--  for biochemical recurrence   Hyperlipidemia    Hypertension    OA (osteoarthritis)    Peripheral vascular disease    Recurrent prostate adenocarcinoma (HCC) first dx 09/2005--  radial prostatectomy-- gleason 4+3=7, pT2c, negative margin   04/2012--  BIOCHEMICAL  RECURRENCE to 0.33 /  SALVAGE IMRT SEPT to  NOV 2013   S/P CABG x 2    09/  2014   S/P dilatation of esophageal stricture    1992   Sciatica    both hips at times mostly left hip   Sigmoid diverticulosis    SUI (stress urinary incontinence), male    Urethral stricture    Wears glasses    Past Surgical History:  Procedure Laterality Date   CARDIAC CATHETERIZATION  08-27-2013  DR SWAZILAND   CRITICAL OSTIAL LM STENOSIS/  diffuse disease LAD 40-50%  &   pLCx 30-40%/   CARDIOVASCULAR STRESS TEST  08-18-2013  DR NASHER   SMALL/ MEDIUM AREA MILD SCAR INFEROSEPTAL WALL/ NO ISCHEMIA/  LOW RISK SCAN/  EF 62%/  LV WALL MOTION WITH SLIGHT DECREASED OF THE SEPTUM   CORONARY ANGIOPLASTY  1991   PTCA  of LEFT CFX   CORONARY ARTERY BYPASS GRAFT N/A 08/27/2013   Procedure: CORONARY ARTERY BYPASS GRAFTING (CABG) times two using left internal mammary and right saphenous vein using  endoscope.;  Surgeon: Elspeth JAYSON Millers, MD;  [facility];  Service: Open Heart Surgery;  Laterality: N/A;   CYSTOSCOPY WITH URETHRAL DILATATION N/A 01/26/2014   Procedure: CYSTOSCOPY WITH URETHRAL DILATATION, BLADDER BIOPSY, FOLEY CATHETER;  Surgeon: Toribio Neysa Repine, MD;  Location: [facility];  Service: Urology;  Laterality: N/A;   INGUINAL HERNIA REPAIR Bilateral 1994   KNEE ARTHROSCOPY Right yrs ago   LEFT HEART CATHETERIZATION WITH CORONARY ANGIOGRAM N/A 08/27/2013   Procedure: LEFT HEART CATHETERIZATION WITH CORONARY ANGIOGRAM;  Surgeon: Peter M Swaziland, MD;  Location: [facility] CATH LAB;  Service: Cardiovascular;  Laterality: N/A;   LUMBAR DISC SURGERY  1980'S   RADICAL RETROPUBIC PROSTATECTOMY W/ BILATERAL PELVIC NODE DISSECTION  11-18-2005   TRANSURETHRAL INCISION OF PROSTATE N/A 11/01/2020   Procedure: TRANSURETHRAL INCISION OF THE BLADDER NECK;  Surgeon: Cam Morene ORN, MD;  [facility];  Service: Urology;  Laterality: N/A;   URETHROTOMY N/A 05/30/2014   Procedure: CYSTOSCOPY  BALLOON DILATION AND DIRECT VISION INTERNAL URETHROTOMY;  Surgeon: Toribio CINDERELLA Repine, MD;  Location: [facility];  Service: Urology;  Laterality: N/A;   Family History  Problem Relation Age of Onset   Cancer Father        prostate died 89 something   Cancer Brother        prostate cancer   Arthritis Other    Hyperlipidemia Other    Hypertension Other    Social History   Socioeconomic History   Marital status: Single    Spouse name: Not on file   Number of children: 2   Years of education: 13   Highest education level: Not on file  Occupational History   Occupation: Retired    Associate Professor: RETIRED  Tobacco Use   Smoking status: Former    Current packs/day: 0.00    Average packs/day: 2.0 packs/day for 10.0 years (20.0 ttl pk-yrs)    Types: Cigarettes    Start date: 12/03/1984    Quit date: 12/03/1994    Years since quitting: 29.8   Smokeless tobacco: Never   Vaping Use   Vaping status: Never Used  Substance and Sexual Activity   Alcohol use: Yes    Comment: occasionally   Drug use: No   Sexual activity: Not on file  Other Topics Concern   Not on file  Social History Narrative   Lives alone   Caffeine use: Coffee- 3 cups daily   Soda sometimes.    Social Drivers of Corporate investment banker Strain: Low Risk  (09/16/2024)   Overall Financial Resource Strain (CARDIA)    Difficulty of Paying Living Expenses: Not very hard  Food Insecurity: No Food Insecurity (09/16/2024)   Hunger Vital Sign    Worried About Running Out of Food in the Last Year: Never true    Ran Out of Food in the Last Year: Never true  Transportation Needs: No Transportation Needs (09/16/2024)   PRAPARE - Administrator, Civil Service (Medical): No    Lack of Transportation (Non-Medical): No  Physical Activity: Insufficiently Active (09/16/2024)   Exercise Vital Sign    Days of Exercise per Week: 3 days    Minutes of Exercise per Session: 10 min  Stress: No Stress Concern Present (09/16/2024)   [employer] of Occupational Health - Occupational Stress Questionnaire    Feeling of Stress: Not at all  Social Connections: Unknown (09/16/2024)   Social Connection and Isolation Panel    Frequency of Communication with Friends and Family: Three times a week    Frequency of Social Gatherings with Friends and Family: Three times a week    Attends Religious Services: Never    Active Member of Clubs or Organizations: No    Attends Banker Meetings: Never    Marital Status: Patient unable to answer    Tobacco Counseling Counseling given: Not Answered    Clinical Intake:  Pre-visit preparation completed: Yes  Pain : No/denies pain     BMI - recorded: 20.34 Nutritional Status: BMI of 19-24  Normal Nutritional Risks: None Diabetes: No  No results found for: HGBA1C   How often do you need to have someone help you when you  read instructions, pamphlets, or other written materials from your doctor or pharmacy?: 1 - Never  Interpreter Needed?: No  Information entered by :: Noam Karaffa,CMA   Activities of Daily  Living     09/16/2024    4:08 PM  In your present state of health, do you have any difficulty performing the following activities:  Hearing? 0  Vision? 0  Difficulty concentrating or making decisions? 0  Walking or climbing stairs? 0  Dressing or bathing? 0  Doing errands, shopping? 0  Preparing Food and eating ? N  Using the Toilet? N  In the past six months, have you accidently leaked urine? N  Do you have problems with loss of bowel control? N  Managing your Medications? N  Managing your Finances? N  Housekeeping or managing your Housekeeping? N    Patient Care Team: Micheal Wolm ORN, MD as PCP - General Nahser, Aleene PARAS, MD (Inactive) as PCP - Cardiology (Cardiology) Robinson Idol, MD as Consulting Physician (Ophthalmology) Lionell Jon DEL, [facility] (Pharmacist)  I have updated your Care Teams any recent Medical Services you may have received from other providers in the past year.     Assessment:   This is a routine wellness examination for [place].  Hearing/Vision screen Hearing Screening - Comments:: No difficulties  Vision Screening - Comments:: Wears glasses    Goals Addressed             This Visit's Progress    Patient Stated   On track    None at this time       Depression Screen     09/16/2024    4:09 PM 07/08/2023   11:30 AM 06/18/2023   10:36 AM 12/03/2022    4:22 PM 05/14/2022   11:48 AM 01/22/2022    2:57 PM 09/19/2021    1:02 PM  PHQ 2/9 Scores  PHQ - 2 Score 0 0 0 0 1 0 1  PHQ- 9 Score 0  0   0 2    Fall Risk     09/16/2024    4:08 PM 07/08/2023   11:30 AM 06/18/2023   10:35 AM 04/30/2023    1:25 PM 12/03/2022    4:22 PM  Fall Risk   Falls in the past year? 0 1 0 0 0  Number falls in past yr: 0 0 0 0 0  Injury with Fall? 0 0 0 0 0  Risk for  fall due to : No Fall Risks  No Fall Risks No Fall Risks No Fall Risks  Follow up Falls evaluation completed  Falls evaluation completed Falls evaluation completed Falls evaluation completed      Data saved with a previous flowsheet row definition    MEDICARE RISK AT HOME:  Medicare Risk at Home Any stairs in or around the home?: No If so, are there any without handrails?: No Home free of loose throw rugs in walkways, pet beds, electrical cords, etc?: Yes Adequate lighting in your home to reduce risk of falls?: Yes Life alert?: No Use of a cane, walker or w/c?: No Grab bars in the bathroom?: Yes Shower chair or bench in shower?: Yes Elevated toilet seat or a handicapped toilet?: Yes  TIMED UP AND GO:  Was the test performed?  No  Cognitive Function: 6CIT completed    06/24/2018   11:35 AM  MMSE - Mini Mental State Exam  Not completed: --        09/16/2024    4:05 PM 05/14/2022   11:49 AM 05/08/2021   11:24 AM  6CIT Screen  What Year? 0 points 0 points 0 points  What month? 0 points 0 points 0 points  What  time? 0 points 3 points 0 points  Count back from 20 0 points 0 points 0 points  Months in reverse 0 points 4 points 4 points  Repeat phrase 0 points 8 points 6 points  Total Score 0 points 15 points 10 points    Immunizations Immunization History  Administered Date(s) Administered   Fluad Quad(high Dose 65+) 08/03/2020, 08/08/2021   Fluad Trivalent(High Dose 65+) 08/19/2023   INFLUENZA, HIGH DOSE SEASONAL PF 08/12/2016, 08/20/2017   Influenza Split 08/25/2012   Influenza Whole 08/16/2010   Influenza,inj,Quad PF,6+ Mos 08/24/2013, 08/04/2014, 08/10/2015   Influenza,inj,quad, With Preservative 11/01/2017   Influenza-Unspecified 08/11/2018   PFIZER Comirnaty(Gray Top)Covid-19 Tri-Sucrose Vaccine 03/06/2021   PFIZER(Purple Top)SARS-COV-2 Vaccination 01/06/2020, 01/31/2020, 09/05/2020   Pfizer Covid-19 Vaccine Bivalent Booster 22yrs & up 08/23/2021   Pneumococcal  Conjugate-13 08/04/2014   Pneumococcal Polysaccharide-23 12/05/2004   Td 11/08/2010, 05/21/2019   Zoster Recombinant(Shingrix) 10/25/2017, 01/29/2018    Screening Tests Health Maintenance  Topic Date Due   Influenza Vaccine  07/02/2024   COVID-19 Vaccine (6 - 2025-26 season) 08/02/2024   Medicare Annual Wellness (AWV)  09/16/2025   DTaP/Tdap/Td (3 - Tdap) 05/20/2029   Pneumococcal Vaccine: 50+ Years  Completed   Zoster Vaccines- Shingrix  Completed   Meningococcal B Vaccine  Aged Out    Health Maintenance Items Addressed:patient declined  Additional Screening:  Vision Screening: Recommended annual ophthalmology exams for early detection of glaucoma and other disorders of the eye. Is the patient up to date with their annual eye exam?  No  Who is the provider or what is the name of the office in which the patient attends annual eye exams?   Dental Screening: Recommended annual dental exams for proper oral hygiene  Community Resource Referral / Chronic Care Management: CRR required this visit?  No   CCM required this visit?  No   Plan:    I have personally reviewed and noted the following in the patient's chart:   Medical and social history Use of alcohol, tobacco or illicit drugs  Current medications and supplements including opioid prescriptions. Patient is not currently taking opioid prescriptions. Functional ability and status Nutritional status Physical activity Advanced directives List of other physicians Hospitalizations, surgeries, and ER visits in previous 12 months Vitals Screenings to include cognitive, depression, and falls Referrals and appointments  In addition, I have reviewed and discussed with patient certain preventive protocols, quality metrics, and best practice recommendations. A written personalized care plan for preventive services as well as general preventive health recommendations were provided to patient.   Lyle MARLA Right,  [place]   09/16/2024   After Visit Summary: (MyChart) Due to this being a telephonic visit, the after visit summary with patients personalized plan was offered to patient via MyChart   Notes: Nothing significant to report at this time.

## 2024-10-17 ENCOUNTER — Other Ambulatory Visit: Payer: Self-pay | Admitting: Family Medicine

## 2024-10-17 DIAGNOSIS — F4321 Adjustment disorder with depressed mood: Secondary | ICD-10-CM

## 2024-11-03 ENCOUNTER — Other Ambulatory Visit: Payer: Self-pay | Admitting: Family Medicine

## 2024-11-03 DIAGNOSIS — I2510 Atherosclerotic heart disease of native coronary artery without angina pectoris: Secondary | ICD-10-CM

## 2024-11-09 DIAGNOSIS — H401131 Primary open-angle glaucoma, bilateral, mild stage: Secondary | ICD-10-CM | POA: Diagnosis not present

## 2024-11-09 DIAGNOSIS — Z961 Presence of intraocular lens: Secondary | ICD-10-CM | POA: Diagnosis not present

## 2024-11-09 DIAGNOSIS — H18513 Endothelial corneal dystrophy, bilateral: Secondary | ICD-10-CM | POA: Diagnosis not present

## 2024-11-26 ENCOUNTER — Other Ambulatory Visit: Payer: Self-pay | Admitting: Family Medicine

## 2024-11-26 DIAGNOSIS — I2510 Atherosclerotic heart disease of native coronary artery without angina pectoris: Secondary | ICD-10-CM

## 2024-12-20 ENCOUNTER — Encounter: Payer: Self-pay | Admitting: Family Medicine

## 2024-12-20 ENCOUNTER — Ambulatory Visit: Admitting: Family Medicine

## 2024-12-20 VITALS — BP 138/68 | HR 53 | Temp 97.9°F | Wt 130.1 lb

## 2024-12-20 DIAGNOSIS — I2510 Atherosclerotic heart disease of native coronary artery without angina pectoris: Secondary | ICD-10-CM | POA: Diagnosis not present

## 2024-12-20 DIAGNOSIS — I10 Essential (primary) hypertension: Secondary | ICD-10-CM

## 2024-12-20 DIAGNOSIS — M25512 Pain in left shoulder: Secondary | ICD-10-CM | POA: Diagnosis not present

## 2024-12-20 DIAGNOSIS — E785 Hyperlipidemia, unspecified: Secondary | ICD-10-CM | POA: Diagnosis not present

## 2024-12-20 DIAGNOSIS — Z23 Encounter for immunization: Secondary | ICD-10-CM

## 2024-12-20 NOTE — Progress Notes (Signed)
 "  Established Patient Office Visit  Subjective   Patient ID: [name], male    DOB: 15-May-1936  Age: 89 y.o. MRN: [id number]  Chief Complaint  Patient presents with   Shoulder Pain    HPI    [name] is seen for medical follow-up.  He complains of left shoulder pain with onset about a week ago but states his pain is actually better today.  Denies any injury.  No associated chest pain.  No weakness.  No paresthesias.  He has good range of motion.  He states that in general he has become more weak but is not using his muscles much for any consistent exercise.  He has history of abdominal aortic aneurysm, CAD, hypertension, GERD, chronic kidney disease stage III, past history of prostate cancer, cognitive impairment.  He seems very unsure of his medications today but should be taking sertraline , carvedilol , and atorvastatin .  He is also uncertain whether he still taking aspirin .  He has 1 daughter [name] who lives locally and another daughter who lives in [place] .  He states he uses a pillbox to help sort out his medications.  He has scheduled follow-up surveillance of abdominal aortic aneurysm February 18.  Overdue for lipids.  Has not had flu vaccine but does consent to getting that. He feels like his balance is decreased in recent years.  Denies any recent falls.  He declines physical therapy referral.  Past Medical History:  Diagnosis Date   AAA (abdominal aortic aneurysm)    AAA (abdominal aortic aneurysm)    4-4 cm intrarenal aaa per us  aorta 03-28-2019 epic   Benign positional vertigo    Bladder cancer [facility])    Bladder neck contracture    Coronary artery disease CARDIOLOGIST--  DR ALVETA   ED (erectile dysfunction)    GERD (gastroesophageal reflux disease)    H/O hiatal hernia    History of anal fissures    History of colon polyps    History of radiation therapy 08/24/12-10/22/12   prostate 6600cGy/33 sessions--  for biochemical recurrence   Hyperlipidemia     Hypertension    OA (osteoarthritis)    Peripheral vascular disease    Recurrent prostate adenocarcinoma (HCC) first dx 09/2005--  radial prostatectomy-- gleason 4+3=7, pT2c, negative margin   04/2012--  BIOCHEMICAL  RECURRENCE to 0.33 /  SALVAGE IMRT SEPT to  NOV 2013   S/P CABG x 2    09/  2014   S/P dilatation of esophageal stricture    1992   Sciatica    both hips at times mostly left hip   Sigmoid diverticulosis    SUI (stress urinary incontinence), male    Urethral stricture    Wears glasses    Past Surgical History:  Procedure Laterality Date   CARDIAC CATHETERIZATION  08-27-2013  DR JORDAN   CRITICAL OSTIAL LM STENOSIS/  diffuse disease LAD 40-50%  &   pLCx 30-40%/   CARDIOVASCULAR STRESS TEST  08-18-2013  DR NASHER   SMALL/ MEDIUM AREA MILD SCAR INFEROSEPTAL WALL/ NO ISCHEMIA/  LOW RISK SCAN/  EF 62%/  LV WALL MOTION WITH SLIGHT DECREASED OF THE SEPTUM   CORONARY ANGIOPLASTY  1991   PTCA  of LEFT CFX   CORONARY ARTERY BYPASS GRAFT N/A 08/27/2013   Procedure: CORONARY ARTERY BYPASS GRAFTING (CABG) times two using left internal mammary and right saphenous vein using endoscope.;  Surgeon: Elspeth JAYSON Millers, MD;  [facility];  Service: Open Heart Surgery;  Laterality: N/A;  CYSTOSCOPY WITH URETHRAL DILATATION N/A 01/26/2014   Procedure: CYSTOSCOPY WITH URETHRAL DILATATION, BLADDER BIOPSY, FOLEY CATHETER;  Surgeon: Toribio Neysa Repine, MD;  Location: [facility];  Service: Urology;  Laterality: N/A;   INGUINAL HERNIA REPAIR Bilateral 1994   KNEE ARTHROSCOPY Right yrs ago   LEFT HEART CATHETERIZATION WITH CORONARY ANGIOGRAM N/A 08/27/2013   Procedure: LEFT HEART CATHETERIZATION WITH CORONARY ANGIOGRAM;  Surgeon: Peter M Jordan, MD;  Location: [facility] CATH LAB;  Service: Cardiovascular;  Laterality: N/A;   LUMBAR DISC SURGERY  1980'S   RADICAL RETROPUBIC PROSTATECTOMY W/ BILATERAL PELVIC NODE DISSECTION  11-18-2005   TRANSURETHRAL INCISION OF PROSTATE N/A 11/01/2020   Procedure:  TRANSURETHRAL INCISION OF THE BLADDER NECK;  Surgeon: Cam Morene ORN, MD;  Location: [facility];  Service: Urology;  Laterality: N/A;   URETHROTOMY N/A 05/30/2014   Procedure: CYSTOSCOPY  BALLOON DILATION AND DIRECT VISION INTERNAL URETHROTOMY;  Surgeon: Toribio CINDERELLA Repine, MD;  Location: [facility];  Service: Urology;  Laterality: N/A;    reports that he quit smoking about 30 years ago. His smoking use included cigarettes. He started smoking about 40 years ago. He has a 20 pack-year smoking history. He has never used smokeless tobacco. He reports current alcohol use. He reports that he does not use drugs. family history includes Arthritis in an other family member; Cancer in his brother and father; Hyperlipidemia in an other family member; Hypertension in an other family member. Allergies[1]   Review of Systems  Constitutional:  Negative for malaise/fatigue.  Eyes:  Negative for blurred vision.  Respiratory:  Negative for shortness of breath.   Cardiovascular:  Negative for chest pain.  Gastrointestinal:  Negative for abdominal pain.  Genitourinary:  Negative for dysuria.  Neurological:  Negative for dizziness, weakness and headaches.      Objective:     BP 138/68 (BP Location: Left Arm, Cuff Size: Normal)   Pulse (!) 53   Temp 97.9 F (36.6 C) (Oral)   Wt 130 lb 1.6 oz (59 kg)   SpO2 96%   BMI 21.00 kg/m  BP Readings from Last 3 Encounters:  12/20/24 138/68  09/16/24 131/72  07/20/24 131/62   Wt Readings from Last 3 Encounters:  12/20/24 130 lb 1.6 oz (59 kg)  09/16/24 126 lb (57.2 kg)  07/20/24 128 lb 6.4 oz (58.2 kg)      Physical Exam Vitals reviewed.  Constitutional:      General: He is not in acute distress.    Appearance: He is not ill-appearing or toxic-appearing.  Cardiovascular:     Rate and Rhythm: Normal rate and regular rhythm.  Pulmonary:     Effort: Pulmonary effort is normal.     Breath sounds: Normal breath  sounds. No wheezing or rales.  Musculoskeletal:     Comments: Left shoulder nontender to palpation.  Full range of motion.  Neurological:     General: No focal deficit present.     Mental Status: He is alert.      No results found for any visits on 12/20/24.  Last CBC Lab Results  Component Value Date   WBC 5.5 05/25/2024   HGB 12.5 (L) 05/25/2024   HCT 37.9 (L) 05/25/2024   MCV 108.2 (H) 05/25/2024   MCH 35.6 (H) 07/04/2023   RDW 13.5 05/25/2024   PLT 114.0 (L) 05/25/2024   Last metabolic panel Lab Results  Component Value Date   GLUCOSE 79 05/25/2024   NA 140 05/25/2024   K 5.7 No  hemolysis seen (H) 05/25/2024   CL 104 05/25/2024   CO2 33 (H) 05/25/2024   BUN 38 (H) 05/25/2024   CREATININE 1.72 (H) 05/25/2024   GFR 35.60 (L) 05/25/2024   CALCIUM  9.9 05/25/2024   PROT 7.0 05/25/2024   ALBUMIN  4.3 05/25/2024   LABGLOB 2.4 01/13/2017   AGRATIO 1.9 01/13/2017   BILITOT 0.6 05/25/2024   ALKPHOS 61 05/25/2024   AST 21 05/25/2024   ALT 23 05/25/2024   ANIONGAP 8 07/04/2023   Last lipids Lab Results  Component Value Date   CHOL 99 05/19/2023   HDL 42.80 05/19/2023   LDLCALC 43 05/19/2023   TRIG 67.0 05/19/2023   CHOLHDL 2 05/19/2023      The ASCVD Risk score (Arnett DK, et al., 2019) failed to calculate for the following reasons:   The 2019 ASCVD risk score is only valid for ages 45 to 35   * - Cholesterol units were assumed    Assessment & Plan:   #1 hyperlipidemia.  Patient is supposed be on atorvastatin  but is unclear regarding medications.  Will check lipid today along with CMP.  #2 hypertension history.  Blood pressure initially up but came down some after rest into suitable range.  Continue carvedilol .  #3 history of chronic kidney disease stage IIIb.  Last GFR 35.6.  Avoid nonsteroidals.  Stay well-hydrated.  Recheck comprehensive metabolic panel.  #4 health maintenance.  Patient has not had flu vaccine and consents to getting that  today.  Return in about 6 months (around 06/19/2025).    Wolm Scarlet, MD     [1]  Allergies Allergen Reactions   Bee Venom Other (See Comments)    Reaction swelling   Amoxicillin  Rash   Hydrocodone Other (See Comments)    REACTION: disorientation   "

## 2024-12-20 NOTE — Addendum Note (Signed)
 Addended by: METTA KRISTEN CROME on: 12/20/2024 04:18 PM   Modules accepted: Orders

## 2024-12-21 ENCOUNTER — Ambulatory Visit: Payer: Self-pay | Admitting: Family Medicine

## 2024-12-21 LAB — COMPREHENSIVE METABOLIC PANEL WITH GFR
ALT: 32 U/L (ref 3–53)
AST: 25 U/L (ref 5–37)
Albumin: 4.4 g/dL (ref 3.5–5.2)
Alkaline Phosphatase: 62 U/L (ref 39–117)
BUN: 43 mg/dL — ABNORMAL HIGH (ref 6–23)
CO2: 31 meq/L (ref 19–32)
Calcium: 9.9 mg/dL (ref 8.4–10.5)
Chloride: 105 meq/L (ref 96–112)
Creatinine, Ser: 1.91 mg/dL — ABNORMAL HIGH (ref 0.40–1.50)
GFR: 31.27 mL/min — ABNORMAL LOW
Glucose, Bld: 82 mg/dL (ref 70–99)
Potassium: 4.5 meq/L (ref 3.5–5.1)
Sodium: 142 meq/L (ref 135–145)
Total Bilirubin: 0.5 mg/dL (ref 0.2–1.2)
Total Protein: 7.4 g/dL (ref 6.0–8.3)

## 2024-12-21 LAB — LIPID PANEL
Cholesterol: 114 mg/dL (ref 28–200)
HDL: 45.6 mg/dL
LDL Cholesterol: 45 mg/dL (ref 10–99)
NonHDL: 68.56
Total CHOL/HDL Ratio: 3
Triglycerides: 118 mg/dL (ref 10.0–149.0)
VLDL: 23.6 mg/dL (ref 0.0–40.0)

## 2025-01-19 ENCOUNTER — Ambulatory Visit

## 2025-01-19 ENCOUNTER — Other Ambulatory Visit (HOSPITAL_COMMUNITY)
